# Patient Record
Sex: Female | Born: 1935 | Race: White | Hispanic: No | Smoking: Never smoker
Health system: Southern US, Community
[De-identification: ages and names within clinical notes are randomized; demographics above are authoritative.]

## PROBLEM LIST (undated history)

## (undated) DIAGNOSIS — E559 Vitamin D deficiency, unspecified: Secondary | ICD-10-CM

## (undated) DIAGNOSIS — IMO0002 Reserved for concepts with insufficient information to code with codable children: Secondary | ICD-10-CM

## (undated) DIAGNOSIS — E782 Mixed hyperlipidemia: Secondary | ICD-10-CM

## (undated) DIAGNOSIS — F0280 Dementia in other diseases classified elsewhere without behavioral disturbance: Secondary | ICD-10-CM

## (undated) DIAGNOSIS — K5730 Diverticulosis of large intestine without perforation or abscess without bleeding: Secondary | ICD-10-CM

## (undated) DIAGNOSIS — D126 Benign neoplasm of colon, unspecified: Secondary | ICD-10-CM

## (undated) DIAGNOSIS — I10 Essential (primary) hypertension: Secondary | ICD-10-CM

## (undated) DIAGNOSIS — C801 Malignant (primary) neoplasm, unspecified: Secondary | ICD-10-CM

## (undated) DIAGNOSIS — M1611 Unilateral primary osteoarthritis, right hip: Secondary | ICD-10-CM

## (undated) DIAGNOSIS — B029 Zoster without complications: Secondary | ICD-10-CM

## (undated) DIAGNOSIS — I5189 Other ill-defined heart diseases: Secondary | ICD-10-CM

## (undated) DIAGNOSIS — C439 Malignant melanoma of skin, unspecified: Secondary | ICD-10-CM

## (undated) DIAGNOSIS — R7303 Prediabetes: Secondary | ICD-10-CM

## (undated) DIAGNOSIS — E785 Hyperlipidemia, unspecified: Secondary | ICD-10-CM

## (undated) DIAGNOSIS — K649 Unspecified hemorrhoids: Secondary | ICD-10-CM

## (undated) DIAGNOSIS — K3184 Gastroparesis: Secondary | ICD-10-CM

## (undated) DIAGNOSIS — K648 Other hemorrhoids: Secondary | ICD-10-CM

## (undated) DIAGNOSIS — R9431 Abnormal electrocardiogram [ECG] [EKG]: Secondary | ICD-10-CM

## (undated) DIAGNOSIS — M1990 Unspecified osteoarthritis, unspecified site: Secondary | ICD-10-CM

## (undated) DIAGNOSIS — K222 Esophageal obstruction: Secondary | ICD-10-CM

## (undated) DIAGNOSIS — D62 Acute posthemorrhagic anemia: Secondary | ICD-10-CM

## (undated) DIAGNOSIS — H3530 Unspecified macular degeneration: Secondary | ICD-10-CM

## (undated) DIAGNOSIS — E039 Hypothyroidism, unspecified: Secondary | ICD-10-CM

## (undated) DIAGNOSIS — G309 Alzheimer's disease, unspecified: Secondary | ICD-10-CM

## (undated) DIAGNOSIS — K219 Gastro-esophageal reflux disease without esophagitis: Secondary | ICD-10-CM

## (undated) HISTORY — DX: Zoster without complications: B02.9

## (undated) HISTORY — DX: Other ill-defined heart diseases: I51.89

## (undated) HISTORY — DX: Dementia in other diseases classified elsewhere without behavioral disturbance: F02.80

## (undated) HISTORY — DX: Other hemorrhoids: K64.8

## (undated) HISTORY — DX: Gastroparesis: K31.84

## (undated) HISTORY — DX: Unspecified osteoarthritis, unspecified site: M19.90

## (undated) HISTORY — PX: OTHER SURGICAL HISTORY: SHX169

## (undated) HISTORY — PX: HEMORRHOID SURGERY: SHX153

## (undated) HISTORY — DX: Unspecified macular degeneration: H35.30

## (undated) HISTORY — PX: CATARACT EXTRACTION, BILATERAL: SHX1313

## (undated) HISTORY — PX: TONSILLECTOMY: SHX5217

## (undated) HISTORY — DX: Gastro-esophageal reflux disease without esophagitis: K21.9

## (undated) HISTORY — DX: Mixed hyperlipidemia: E78.2

## (undated) HISTORY — PX: CHOLECYSTECTOMY: SHX55

## (undated) HISTORY — DX: Esophageal obstruction: K22.2

## (undated) HISTORY — DX: Benign neoplasm of colon, unspecified: D12.6

## (undated) HISTORY — DX: Diverticulosis of large intestine without perforation or abscess without bleeding: K57.30

## (undated) HISTORY — DX: Essential (primary) hypertension: I10

## (undated) HISTORY — DX: Prediabetes: R73.03

## (undated) HISTORY — DX: Malignant melanoma of skin, unspecified: C43.9

## (undated) HISTORY — DX: Unilateral primary osteoarthritis, right hip: M16.11

## (undated) HISTORY — DX: Vitamin D deficiency, unspecified: E55.9

## (undated) HISTORY — DX: Abnormal electrocardiogram (ECG) (EKG): R94.31

## (undated) HISTORY — PX: FRACTURE SURGERY: SHX138

## (undated) HISTORY — DX: Acute posthemorrhagic anemia: D62

## (undated) HISTORY — DX: Alzheimer's disease, unspecified: G30.9

## (undated) HISTORY — DX: Reserved for concepts with insufficient information to code with codable children: IMO0002

## (undated) HISTORY — DX: Hyperlipidemia, unspecified: E78.5

## (undated) HISTORY — DX: Unspecified hemorrhoids: K64.9

## (undated) HISTORY — PX: THYROIDECTOMY: SHX17

## (undated) HISTORY — PX: APPENDECTOMY: SHX54

---

## 1997-08-31 ENCOUNTER — Ambulatory Visit (HOSPITAL_COMMUNITY): Admission: RE | Admit: 1997-08-31 | Discharge: 1997-08-31 | Payer: Self-pay | Admitting: *Deleted

## 1998-02-12 ENCOUNTER — Ambulatory Visit (HOSPITAL_COMMUNITY): Admission: RE | Admit: 1998-02-12 | Discharge: 1998-02-12 | Payer: Self-pay | Admitting: *Deleted

## 1998-06-19 ENCOUNTER — Ambulatory Visit (HOSPITAL_COMMUNITY): Admission: RE | Admit: 1998-06-19 | Discharge: 1998-06-19 | Payer: Self-pay | Admitting: *Deleted

## 1998-07-31 ENCOUNTER — Ambulatory Visit (HOSPITAL_COMMUNITY): Admission: RE | Admit: 1998-07-31 | Discharge: 1998-07-31 | Payer: Self-pay | Admitting: *Deleted

## 1998-10-01 ENCOUNTER — Encounter: Payer: Self-pay | Admitting: *Deleted

## 1998-10-01 ENCOUNTER — Ambulatory Visit (HOSPITAL_COMMUNITY): Admission: RE | Admit: 1998-10-01 | Discharge: 1998-10-01 | Payer: Self-pay | Admitting: *Deleted

## 1998-11-12 ENCOUNTER — Encounter (HOSPITAL_BASED_OUTPATIENT_CLINIC_OR_DEPARTMENT_OTHER): Payer: Self-pay | Admitting: General Surgery

## 1998-11-14 ENCOUNTER — Ambulatory Visit (HOSPITAL_COMMUNITY): Admission: RE | Admit: 1998-11-14 | Discharge: 1998-11-15 | Payer: Self-pay | Admitting: General Surgery

## 1999-01-22 ENCOUNTER — Encounter: Payer: Self-pay | Admitting: *Deleted

## 1999-01-22 ENCOUNTER — Ambulatory Visit (HOSPITAL_COMMUNITY): Admission: RE | Admit: 1999-01-22 | Discharge: 1999-01-22 | Payer: Self-pay | Admitting: *Deleted

## 1999-01-29 ENCOUNTER — Encounter: Payer: Self-pay | Admitting: *Deleted

## 1999-01-29 ENCOUNTER — Ambulatory Visit (HOSPITAL_COMMUNITY): Admission: RE | Admit: 1999-01-29 | Discharge: 1999-01-29 | Payer: Self-pay | Admitting: *Deleted

## 1999-02-07 ENCOUNTER — Encounter: Payer: Self-pay | Admitting: *Deleted

## 1999-02-07 ENCOUNTER — Ambulatory Visit (HOSPITAL_COMMUNITY): Admission: RE | Admit: 1999-02-07 | Discharge: 1999-02-07 | Payer: Self-pay | Admitting: *Deleted

## 1999-11-15 ENCOUNTER — Encounter: Payer: Self-pay | Admitting: *Deleted

## 1999-11-15 ENCOUNTER — Ambulatory Visit (HOSPITAL_COMMUNITY): Admission: RE | Admit: 1999-11-15 | Discharge: 1999-11-15 | Payer: Self-pay | Admitting: *Deleted

## 2000-01-10 ENCOUNTER — Encounter: Payer: Self-pay | Admitting: Obstetrics & Gynecology

## 2000-01-10 ENCOUNTER — Encounter: Admission: RE | Admit: 2000-01-10 | Discharge: 2000-01-10 | Payer: Self-pay | Admitting: Obstetrics & Gynecology

## 2000-04-14 HISTORY — PX: GALLBLADDER SURGERY: SHX652

## 2000-06-08 ENCOUNTER — Encounter: Payer: Self-pay | Admitting: *Deleted

## 2000-06-08 ENCOUNTER — Ambulatory Visit (HOSPITAL_COMMUNITY): Admission: RE | Admit: 2000-06-08 | Discharge: 2000-06-08 | Payer: Self-pay | Admitting: *Deleted

## 2000-06-15 ENCOUNTER — Ambulatory Visit (HOSPITAL_COMMUNITY): Admission: RE | Admit: 2000-06-15 | Discharge: 2000-06-15 | Payer: Self-pay | Admitting: *Deleted

## 2000-06-15 ENCOUNTER — Encounter: Payer: Self-pay | Admitting: *Deleted

## 2000-06-22 ENCOUNTER — Other Ambulatory Visit: Admission: RE | Admit: 2000-06-22 | Discharge: 2000-06-22 | Payer: Self-pay | Admitting: Urology

## 2000-07-12 ENCOUNTER — Emergency Department (HOSPITAL_COMMUNITY): Admission: EM | Admit: 2000-07-12 | Discharge: 2000-07-12 | Payer: Self-pay | Admitting: Emergency Medicine

## 2000-08-12 ENCOUNTER — Encounter (HOSPITAL_BASED_OUTPATIENT_CLINIC_OR_DEPARTMENT_OTHER): Payer: Self-pay | Admitting: General Surgery

## 2000-08-14 ENCOUNTER — Encounter (INDEPENDENT_AMBULATORY_CARE_PROVIDER_SITE_OTHER): Payer: Self-pay | Admitting: *Deleted

## 2000-08-14 ENCOUNTER — Ambulatory Visit (HOSPITAL_COMMUNITY): Admission: RE | Admit: 2000-08-14 | Discharge: 2000-08-15 | Payer: Self-pay | Admitting: General Surgery

## 2000-08-14 ENCOUNTER — Encounter (HOSPITAL_BASED_OUTPATIENT_CLINIC_OR_DEPARTMENT_OTHER): Payer: Self-pay | Admitting: General Surgery

## 2000-11-17 ENCOUNTER — Encounter: Payer: Self-pay | Admitting: *Deleted

## 2000-11-17 ENCOUNTER — Ambulatory Visit (HOSPITAL_COMMUNITY): Admission: RE | Admit: 2000-11-17 | Discharge: 2000-11-17 | Payer: Self-pay | Admitting: *Deleted

## 2001-01-07 ENCOUNTER — Encounter: Admission: RE | Admit: 2001-01-07 | Discharge: 2001-01-07 | Payer: Self-pay | Admitting: *Deleted

## 2001-01-07 ENCOUNTER — Other Ambulatory Visit: Admission: RE | Admit: 2001-01-07 | Discharge: 2001-01-07 | Payer: Self-pay | Admitting: Obstetrics and Gynecology

## 2001-01-07 ENCOUNTER — Encounter: Payer: Self-pay | Admitting: *Deleted

## 2001-05-19 ENCOUNTER — Emergency Department (HOSPITAL_COMMUNITY): Admission: EM | Admit: 2001-05-19 | Discharge: 2001-05-19 | Payer: Self-pay

## 2001-07-02 ENCOUNTER — Ambulatory Visit (HOSPITAL_COMMUNITY): Admission: RE | Admit: 2001-07-02 | Discharge: 2001-07-02 | Payer: Self-pay | Admitting: *Deleted

## 2001-12-11 ENCOUNTER — Emergency Department (HOSPITAL_COMMUNITY): Admission: EM | Admit: 2001-12-11 | Discharge: 2001-12-11 | Payer: Self-pay | Admitting: Emergency Medicine

## 2002-01-07 ENCOUNTER — Other Ambulatory Visit: Admission: RE | Admit: 2002-01-07 | Discharge: 2002-01-07 | Payer: Self-pay | Admitting: Obstetrics and Gynecology

## 2002-01-08 ENCOUNTER — Emergency Department (HOSPITAL_COMMUNITY): Admission: EM | Admit: 2002-01-08 | Discharge: 2002-01-08 | Payer: Self-pay | Admitting: Emergency Medicine

## 2002-01-10 ENCOUNTER — Encounter: Payer: Self-pay | Admitting: *Deleted

## 2002-01-10 ENCOUNTER — Encounter: Admission: RE | Admit: 2002-01-10 | Discharge: 2002-01-10 | Payer: Self-pay | Admitting: *Deleted

## 2002-01-17 ENCOUNTER — Encounter: Payer: Self-pay | Admitting: Obstetrics and Gynecology

## 2002-01-17 ENCOUNTER — Encounter: Admission: RE | Admit: 2002-01-17 | Discharge: 2002-01-17 | Payer: Self-pay | Admitting: Obstetrics and Gynecology

## 2003-01-13 ENCOUNTER — Encounter: Admission: RE | Admit: 2003-01-13 | Discharge: 2003-01-13 | Payer: Self-pay | Admitting: *Deleted

## 2003-01-13 ENCOUNTER — Encounter: Payer: Self-pay | Admitting: *Deleted

## 2004-02-01 ENCOUNTER — Encounter: Admission: RE | Admit: 2004-02-01 | Discharge: 2004-02-01 | Payer: Self-pay | Admitting: Internal Medicine

## 2004-06-04 ENCOUNTER — Ambulatory Visit: Payer: Self-pay | Admitting: Gastroenterology

## 2004-06-28 ENCOUNTER — Ambulatory Visit: Payer: Self-pay | Admitting: Gastroenterology

## 2004-06-28 DIAGNOSIS — K5730 Diverticulosis of large intestine without perforation or abscess without bleeding: Secondary | ICD-10-CM

## 2004-06-28 HISTORY — DX: Diverticulosis of large intestine without perforation or abscess without bleeding: K57.30

## 2005-02-12 ENCOUNTER — Encounter: Admission: RE | Admit: 2005-02-12 | Discharge: 2005-02-12 | Payer: Self-pay | Admitting: Internal Medicine

## 2006-02-13 ENCOUNTER — Encounter: Admission: RE | Admit: 2006-02-13 | Discharge: 2006-02-13 | Payer: Self-pay | Admitting: Internal Medicine

## 2006-06-22 ENCOUNTER — Encounter: Admission: RE | Admit: 2006-06-22 | Discharge: 2006-06-22 | Payer: Self-pay | Admitting: Internal Medicine

## 2007-02-15 ENCOUNTER — Encounter: Admission: RE | Admit: 2007-02-15 | Discharge: 2007-02-15 | Payer: Self-pay | Admitting: Internal Medicine

## 2007-03-20 ENCOUNTER — Emergency Department (HOSPITAL_COMMUNITY): Admission: EM | Admit: 2007-03-20 | Discharge: 2007-03-20 | Payer: Self-pay | Admitting: Emergency Medicine

## 2007-07-21 ENCOUNTER — Ambulatory Visit: Payer: Self-pay | Admitting: Gastroenterology

## 2007-08-30 ENCOUNTER — Telehealth: Payer: Self-pay | Admitting: Gastroenterology

## 2007-09-08 ENCOUNTER — Ambulatory Visit: Payer: Self-pay | Admitting: Gastroenterology

## 2007-09-09 ENCOUNTER — Telehealth: Payer: Self-pay | Admitting: Gastroenterology

## 2007-09-10 ENCOUNTER — Telehealth: Payer: Self-pay | Admitting: Gastroenterology

## 2007-10-12 ENCOUNTER — Encounter: Payer: Self-pay | Admitting: Gastroenterology

## 2007-10-25 DIAGNOSIS — K219 Gastro-esophageal reflux disease without esophagitis: Secondary | ICD-10-CM | POA: Insufficient documentation

## 2007-10-27 ENCOUNTER — Ambulatory Visit: Payer: Self-pay | Admitting: Gastroenterology

## 2007-10-27 DIAGNOSIS — K222 Esophageal obstruction: Secondary | ICD-10-CM

## 2007-10-27 HISTORY — DX: Esophageal obstruction: K22.2

## 2008-03-14 ENCOUNTER — Encounter: Admission: RE | Admit: 2008-03-14 | Discharge: 2008-03-14 | Payer: Self-pay | Admitting: Internal Medicine

## 2008-06-22 ENCOUNTER — Encounter: Payer: Self-pay | Admitting: Cardiovascular Disease

## 2008-08-09 ENCOUNTER — Encounter: Admission: RE | Admit: 2008-08-09 | Discharge: 2008-08-09 | Payer: Self-pay | Admitting: Internal Medicine

## 2009-03-15 ENCOUNTER — Encounter: Admission: RE | Admit: 2009-03-15 | Discharge: 2009-03-15 | Payer: Self-pay | Admitting: Internal Medicine

## 2009-07-17 ENCOUNTER — Encounter: Payer: Self-pay | Admitting: Cardiovascular Disease

## 2009-07-18 ENCOUNTER — Encounter: Payer: Self-pay | Admitting: Cardiovascular Disease

## 2009-07-27 ENCOUNTER — Encounter: Admission: RE | Admit: 2009-07-27 | Discharge: 2009-07-27 | Payer: Self-pay | Admitting: Internal Medicine

## 2009-08-14 ENCOUNTER — Ambulatory Visit: Payer: Self-pay | Admitting: Cardiovascular Disease

## 2009-08-22 ENCOUNTER — Ambulatory Visit: Payer: Self-pay | Admitting: Gastroenterology

## 2009-08-29 ENCOUNTER — Ambulatory Visit: Payer: Self-pay

## 2009-08-29 ENCOUNTER — Ambulatory Visit: Payer: Self-pay | Admitting: Cardiology

## 2009-08-29 ENCOUNTER — Ambulatory Visit (HOSPITAL_COMMUNITY): Admission: RE | Admit: 2009-08-29 | Discharge: 2009-08-29 | Payer: Self-pay | Admitting: Cardiovascular Disease

## 2009-09-04 ENCOUNTER — Ambulatory Visit: Payer: Self-pay | Admitting: Cardiovascular Disease

## 2009-09-04 DIAGNOSIS — I5189 Other ill-defined heart diseases: Secondary | ICD-10-CM

## 2009-09-04 HISTORY — DX: Other ill-defined heart diseases: I51.89

## 2010-03-18 ENCOUNTER — Encounter: Admission: RE | Admit: 2010-03-18 | Discharge: 2010-03-18 | Payer: Self-pay | Admitting: Internal Medicine

## 2010-05-14 NOTE — Assessment & Plan Note (Signed)
Summary: consult before col...em   History of Present Illness Visit Type: consult  Primary GI MD: [name] MD [facility] Primary Provider: Marisue Brooklyn, DO  Requesting Provider: Marisue Brooklyn DO Chief Complaint: Consult colon. Pt denies any GI complaints  History of Present Illness:   Crystal Petersen is 77year-old white female referred at the request of Dr. Elisabeth Most for consideration of followup colonoscopy.  In 2006 2 diminutive adenomatous polyps were removed on routine testing.  The patient has no GI complaints including change of bowel habits, abdominal pain, melena or hematochezia   GI Review of Systems      Denies abdominal pain, acid reflux, belching, bloating, chest pain, dysphagia with liquids, dysphagia with solids, heartburn, loss of appetite, nausea, vomiting, vomiting blood, weight loss, and  weight gain.        Denies anal fissure, black tarry stools, change in bowel habit, constipation, diarrhea, diverticulosis, fecal incontinence, heme positive stool, hemorrhoids, irritable bowel syndrome, jaundice, light color stool, liver problems, rectal bleeding, and  rectal pain.    Current Medications (verified): 1)  Sular 17 Mg  Tb24 (Nisoldipine) .... One Tablet Every Day 2)  Labetalol Hcl 100 Mg  Tabs (Labetalol Hcl) .... One Tablet Twice A Day 3)  Levothyroxine Sodium 100 Mcg  Tabs (Levothyroxine Sodium) .... As Directed 4)  Zetia 10 Mg  Tabs (Ezetimibe) .... One Tablet Every Day 5)  Miralax   Powd (Polyethylene Glycol 3350) .... Prn 6)  Benefiber   Powd (Wheat Dextrin) .... 2 Tsp Prn 7)  Colace 100 Mg  Caps (Docusate Sodium) .... 2 Tab Prn 8)  Omeprazole 40 Mg Cpdr (Omeprazole) .... One Tablet By Mouth 30 Min Before Breakfast 9)  Vitamin D3 2000 Unit Caps (Cholecalciferol) .... 2  Daily 10)  Code Ats48 Study Drug .... 2 Tabs Two Times A Day 11)  Centrum Silver Ultra Womens  Tabs (Multiple Vitamins-Minerals) 12)  Cal-Gest Antacid 500 Mg Chew (Calcium Carbonate Antacid) .... As  Needed 13)  Study Drug Code Lut/[id number] 14)  Ra Col-Rite 100 Mg Caps (Docusate Sodium) .... As Needed  Allergies (verified): 1)  ! Sulfa 2)  ! Reglan  Past History:  Past Medical History: Reviewed history from 08/14/2009 and no changes required. ELECTROCARDIOGRAM, ABNORMAL (ICD-794.31) STRICTURE AND STENOSIS OF ESOPHAGUS (ICD-530.3) GERD (ICD-530.81) Hiatal hernia DIVERTICULOSIS, COLON (ICD-562.10) ADENOMATOUS COLONIC POLYP (ICD-211.3) Macular degeneration Osteoperosis Hyperlipidemia Hypothyroidism HTN Shingles  Cystocele Gastroparesis     Past Surgical History: Reviewed history from 08/14/2009 and no changes required. gallbladder surgery 2002 hemorrhoid surgery appendectomy Thyroidectomy Left eye surgery Bilateral cataract removal Right arm melanoma resection BCC resection legs/face Tonsillectomy  Family History: Family History of Esophageal Cancer:brother Family History of Heart Disease: father Father deceased,  age 42, CAD/MI Mother deceased, age 39  ruptured appendix 2 brothers - 1 alive. 1 deceased pancreatic cancer 1 sister deceased encephalitis No FH of Colon Cancer:  Social History: Reviewed history from 08/14/2009 and no changes required. 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  Alcohol Use - no Illicit Drug Use - no Patient does not get regular exercise.   Review of Systems       The patient complains of arthritis/joint pain.  The patient denies allergy/sinus, anemia, anxiety-new, back pain, blood in urine, breast changes/lumps, change in vision, confusion, cough, coughing up blood, depression-new, fainting, fatigue, fever, headaches-new, hearing problems, heart murmur, heart rhythm changes, itching, menstrual pain, muscle pains/cramps, night sweats, nosebleeds, pregnancy symptoms, shortness of breath, skin rash,  sleeping problems, sore throat, swelling of feet/legs, swollen lymph glands, thirst - excessive  , urination - excessive , urination changes/pain, urine leakage, vision changes, and voice change.         All other systems were reviewed and were negative   Vital Signs:  Patient profile:   77 year old female Height:      64 inches Weight:      122 pounds BMI:     21.02 BSA:     1.59 Pulse rate:   60 / minute Pulse rhythm:   regular BP sitting:   126 / 64  (left arm) Cuff size:   regular  Vitals Entered By: Ok Anis CMA (Aug 22, 2009 1:29 PM)  Physical Exam  Additional Exam:  On physical exam she is an elderly female  skin: anicteric HEENT: normocephalic; PEERLA; no nasal or pharyngeal abnormalities neck: supple nodes: no cervical lymphadenopathy chest: clear to ausculatation and percussion heart: no murmurs, gallops, or rubs abd: soft, nontender; BS normoactive; no abdominal masses, tenderness, organomegaly rectal: deferred ext: no cynanosis, clubbing, edema skeletal: no deformities neuro: oriented x 3; no focal abnormalities    Impression & Recommendations:  Problem # 1:  ADENOMATOUS COLONIC POLYP (ICD-211.3) Tthe patient has no GI complaints at this time.  Given her age I believe that the risk exceeds  the benefit from elective colonoscopy.   Problem # 2:  STRICTURE AND STENOSIS OF ESOPHAGUS (ICD-530.3) Patient is asymptomatic.  Plan to repeat dilatation p.r.n.  Patient Instructions: 1)  Copy sent to : Amy Stevenson,MD 2)  The medication list was reviewed and reconciled.  All changed / newly prescribed medications were explained.  A complete medication list was provided to the patient / caregiver.

## 2010-05-14 NOTE — Assessment & Plan Note (Signed)
Summary: per check out/sf   Visit Type:  Follow-up Referring Provider:  Marisue Brooklyn DO Primary Provider:  Marisue Brooklyn, DO   CC:  No cardiac complains.  History of Present Illness: 77 yo WF with history of HTN, hyperlipidemia, hypothyroidism and GERD who is here today for cardiac follow up. She was seen several weeks ago to establish cardiology care and for further assessment of abnormal EKG which showed left anterior fascicular block. She had been feeling well. She did not describe any exertional chest pain. Breathing was ok. Fatigue was her only complaint. I ordered an echo to assess her LV size and function. (details of echo below). She has no new complaints today. She describes overall lack of energy and inability to sleep well at night. She is on thyroid replacement therapy with recent normal TSH.   Current Medications (verified): 1)  Sular 17 Mg  Tb24 (Nisoldipine) .... One Tablet Every Day 2)  Labetalol Hcl 100 Mg  Tabs (Labetalol Hcl) .... One Tablet Twice A Day 3)  Levothyroxine Sodium 100 Mcg  Tabs (Levothyroxine Sodium) .... As Directed 4)  Zetia 10 Mg  Tabs (Ezetimibe) .... One Tablet Every Day 5)  Miralax   Powd (Polyethylene Glycol 3350) .... Prn 6)  Benefiber   Powd (Wheat Dextrin) .... 2 Tsp Prn 7)  Colace 100 Mg  Caps (Docusate Sodium) .... 2 Tab Prn 8)  Omeprazole 40 Mg Cpdr (Omeprazole) .... One Tablet By Mouth 30 Min Before Breakfast 9)  Vitamin D 4000 Iu .... Once A Day 10)  Code Ats48 Study Drug .... 2 Tabs Two Times A Day 11)  Centrum Silver Ultra Womens  Tabs (Multiple Vitamins-Minerals) 12)  Cal-Gest Antacid 500 Mg Chew (Calcium Carbonate Antacid) .... As Needed 13)  Study Drug Code Lut/[id number] 14)  Flax Seed Oil 1000 Mg Caps (Flaxseed (Linseed)) .... 3 X A Week  Allergies: 1)  ! Sulfa 2)  ! Reglan 3)  ! * Statins  Past History:  Past Medical History: Reviewed history from 08/14/2009 and no changes required. ELECTROCARDIOGRAM, ABNORMAL  (ICD-794.31) STRICTURE AND STENOSIS OF ESOPHAGUS (ICD-530.3) GERD (ICD-530.81) Hiatal hernia DIVERTICULOSIS, COLON (ICD-562.10) ADENOMATOUS COLONIC POLYP (ICD-211.3) Macular degeneration Osteoperosis Hyperlipidemia Hypothyroidism HTN Shingles  Cystocele Gastroparesis     Social History: Reviewed history from 08/14/2009 and no changes required. 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  Alcohol Use - no Illicit Drug Use - no Patient does not get regular exercise.   Review of Systems       The patient complains of fatigue.  The patient denies malaise, fever, weight gain/loss, vision loss, decreased hearing, hoarseness, chest pain, palpitations, shortness of breath, prolonged cough, wheezing, sleep apnea, coughing up blood, abdominal pain, blood in stool, nausea, vomiting, diarrhea, heartburn, incontinence, blood in urine, muscle weakness, joint pain, leg swelling, rash, skin lesions, headache, fainting, dizziness, depression, anxiety, enlarged lymph nodes, easy bruising or bleeding, and environmental allergies.    Vital Signs:  Patient profile:   77 year old female Height:      64 inches Weight:      120.75 pounds BMI:     20.80 Pulse rate:   66 / minute Pulse rhythm:   regular Resp:     18 per minute BP sitting:   116 / 58  (right arm) Cuff size:   regular  Vitals Entered By: Vikki Ports (Sep 04, 2009 12:21 PM)  Physical Exam  General:  General: Well developed, well nourished, NAD HEENT:  OP clear, mucus membranes moist SKIN: warm, dry Psychiatric: Mood and affect normal.  Neck: No JVD Lungs:Clear bilaterally, no wheezes, rhonci, crackles CV: RRR no murmurs, gallops rubs Abdomen: soft, NT, ND, BS present Extremities: No edema, pulses 2+.    Echocardiogram  Procedure date:  08/29/2009  Findings:      Left ventricle: The cavity size was normal. There was mild focal       basal hypertrophy of the septum. Systolic  function was normal. The       estimated ejection fraction was in the range of 55% to 60%. Wall       motion was normal; there were no regional wall motion       abnormalities. Doppler parameters are consistent with abnormal       left ventricular relaxation (grade 1 diastolic dysfunction).     - Aortic valve: Trivial regurgitation.     - Left atrium: The atrium was mildly to moderately dilated.     - Right atrium: The atrium was mildly to moderately dilated.     - Pulmonary arteries: Systolic pressure was mildly increased.     - Pericardium, extracardiac: A small pericardial effusion was       identified.  Impression & Recommendations:  Problem # 1:  DIASTOLIC DYSFUNCTION (ICD-429.9) Echo with grade 1 diastolic dysfunction with normal LV systolic function. There was a small hemodynamically insignificant pericardial effusion. Repeat echo one year. No further cardiac workup at this time.   Patient Instructions: 1)  Your physician recommends that you schedule a follow-up appointment in: 1 year 2)  Your physician has requested that you have an echocardiogram.  Echocardiography is a painless test that uses sound waves to create images of your heart. It provides your doctor with information about the size and shape of your heart and how well your heart's chambers and valves are working.  This procedure takes approximately one hour. There are no restrictions for this procedure. To be done in 1 year

## 2010-05-14 NOTE — Assessment & Plan Note (Signed)
Summary: NP6/EKG CHANGES/BLUE-MEDICARE-MB   Visit Type:  Initial Consult Primary Provider:  Marisue Brooklyn MD  CC:  no complaints.  History of Present Illness: 77 yo WF with history of HTN, hyperlipidemia, hypothyroidism and GERD who is referred today to establish cardiology care and for further assessment of abnormal EKG. She has been feeling well. She has had no exertional chest pain. Breathing has been ok. She has had to slow down more over the last few years secondary to fatigue. She has had no dizziness, near syncope or syncope. She denies palpitations or awareness of irregularity of her heart rhythm. She was found to have an abnormal eKG at Dr. Hardie Pulley office.   Problems Prior to Update: 1)  Electrocardiogram, Abnormal  (ICD-794.31) 2)  Stricture and Stenosis of Esophagus  (ICD-530.3) 3)  Gerd  (ICD-530.81) 4)  Diverticulosis, Colon  (ICD-562.10) 5)  Adenomatous Colonic Polyp  (ICD-211.3)  Current Medications (verified): 1)  Sular 17 Mg  Tb24 (Nisoldipine) .... One Tablet Every Day 2)  Labetalol Hcl 100 Mg  Tabs (Labetalol Hcl) .... One Tablet Twice A Day 3)  Levothyroxine Sodium 100 Mcg  Tabs (Levothyroxine Sodium) .... One Tablet Every Other Day 4)  Zetia 10 Mg  Tabs (Ezetimibe) .... One Tablet Every Day 5)  Zegerid 20-1100 Mg  Caps (Omeprazole-Sodium Bicarbonate) .... 2 Each Day 30 Minutes Before Meal 6)  Miralax   Powd (Polyethylene Glycol 3350) .... Prn 7)  Benefiber   Powd (Wheat Dextrin) .... 2 Tsp Prn 8)  Colace 100 Mg  Caps (Docusate Sodium) .... 2 Tab Prn 9)  Vitamin C 500 Mg  Tabs (Ascorbic Acid) .Marland Kitchen.. 1 Once Daily 10)  Omeprazole 40 Mg Cpdr (Omeprazole) .Marland Kitchen.. 1 Tab Two Times A Day 11)  Vitamin D3 2000 Unit Caps (Cholecalciferol) .... 2  Daily 12)  Code Ats48 Study Drug .... 2 Tabs Two Times A Day 13)  Centrum Silver Ultra Womens  Tabs (Multiple Vitamins-Minerals) 14)  Cal-Gest Antacid 500 Mg Chew (Calcium Carbonate Antacid) .... As Needed 15)  Zegerid 40-1100 Mg  Caps (Omeprazole-Sodium Bicarbonate) .... Once Daily 16)  Study Drug Code Lut/[id number] 17)  Ra Col-Rite 100 Mg Caps (Docusate Sodium) .... As Needed  Allergies: 1)  ! Sulfa 2)  ! Reglan  Past History:  Past Medical History: ELECTROCARDIOGRAM, ABNORMAL (ICD-794.31) STRICTURE AND STENOSIS OF ESOPHAGUS (ICD-530.3) GERD (ICD-530.81) Hiatal hernia DIVERTICULOSIS, COLON (ICD-562.10) ADENOMATOUS COLONIC POLYP (ICD-211.3) Macular degeneration Osteoperosis Hyperlipidemia Hypothyroidism HTN Shingles  Cystocele Gastroparesis     Past Surgical History: gallbladder surgery 2002 hemorrhoid surgery appendectomy Thyroidectomy Left eye surgery Bilateral cataract removal Right arm melanoma resection BCC resection legs/face Tonsillectomy  Family History: Reviewed history from 10/27/2007 and no changes required. Family History of Esophageal Cancer:brother Family History of Heart Disease: father  Father deceased,  age 43, CAD/MI Mother deceased, age 6  ruptured appendix 2 brothers - 1 alive. 1 deceased pancreatic cancer 1 sister deceased encephalitis  Social History: Reviewed history from 10/27/2007 and no changes required. 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  Alcohol Use - no Illicit Drug Use - no Patient does not get regular exercise.   Vital Signs:  Patient profile:   77 year old female Height:      64 inches Weight:      122 pounds BMI:     21.02 Pulse rate:   74 / minute BP sitting:   123 / 69  (left arm) Cuff size:   regular  Vitals Entered  By: Oswald Hillock (Aug 14, 2009 3:14 PM)  Physical Exam  General:  General: Well developed, well nourished, NAD HEENT: OP clear, mucus membranes moist SKIN: warm, dry Neuro: No focal deficits Musculoskeletal: Muscle strength 5/5 all ext Psychiatric: Mood and affect normal Neck: No JVD, no carotid bruits, no thyromegaly, no lymphadenopathy. Lungs:Clear bilaterally, no  wheezes, rhonci, crackles CV: RRR no murmurs, gallops rubs Abdomen: soft, NT, ND, BS present Extremities: No edema, pulses 2+.    EKG  Procedure date:  08/14/2009  Findings:      NSR, Left anterior fascicular block.   Impression & Recommendations:  Problem # 1:  ELECTROCARDIOGRAM, ABNORMAL (ICD-794.31)  Her EKG is normal with the exception of a left anterior fascicular block. I have reassured her that this generally is a benign finding. I will arrange an echo to assess her LV function and exclude structural heart disease. I will see her back in several weeks to review.   Her updated medication list for this problem includes:    Sular 17 Mg Tb24 (Nisoldipine) ..... One tablet every day    Labetalol Hcl 100 Mg Tabs (Labetalol hcl) ..... One tablet twice a day  Orders: EKG w/ Interpretation (93000) Echocardiogram (Echo)  Patient Instructions: 1)  Your physician recommends that you schedule a follow-up appointment in: 3 weeks 2)  Your physician has requested that you have an echocardiogram.  Echocardiography is a painless test that uses sound waves to create images of your heart. It provides your doctor with information about the size and shape of your heart and how well your heart's chambers and valves are working.  This procedure takes approximately one hour. There are no restrictions for this procedure.

## 2010-05-14 NOTE — Letter (Signed)
Summary: [facility] Adolescent Office Note  [facility] Adolescent Office Note   Imported By: Roderic Ovens 09/03/2009 15:19:36  _____________________________________________________________________  External Attachment:    Type:   Image     Comment:   External Document

## 2010-05-17 NOTE — Letter (Signed)
Summary: [facility] Adolescent Office Visit   [facility] Adolescent Office Visit   Imported By: Roderic Ovens 09/03/2009 15:20:45  _____________________________________________________________________  External Attachment:    Type:   Image     Comment:   External Document

## 2010-08-27 NOTE — Assessment & Plan Note (Signed)
[facility]                         GASTROENTEROLOGY OFFICE NOTE   NAME:Shea, LUCYLLE FOULKES                     MRN:          [id number]  DATE:07/21/2007                            DOB:          February 09, 1922    PROBLEM:  Abdominal and chest pain.   Mrs. Martelle has returned for re-evaluation of complaints of the above.  For over three months, she has been complaining of regurgitation of  gastric contents with excessive belching.  She was hospitalized in  December 2007, because of severe chest pain.  She was placed on  omeprazole with improvement in her pain.  Since that time, she has had  persistent postprandial belching, upper abdominal pain and regurgitation  of gastric contents.  She initially took Reglan, which she had to  discontinue because of constipation.  She denies dysphagia.  She is  currently taking Zegerid with some improvement.   MEDICATIONS:  Include Sular, labetalol, levothyroxine, Zetia, and  Zegerid.   ALLERGIES:  She is allergic to SULFA and possibly REGLAN.   REVIEW OF SYSTEMS:  Was reviewed and is negative.   PHYSICAL EXAMINATION:  Pulse 72, blood pressure 130/76, weight 125.  HEENT: EOMI.  PERRLA.  Sclerae are anicteric.  Conjunctivae are pink.  NECK:  Supple without thyromegaly, adenopathy or carotid bruits.  CHEST:  Clear to auscultation and percussion without adventitious  sounds.  CARDIAC:  Regular rhythm; normal S1 S2.  There are no murmurs, gallops  or rubs.  ABDOMEN:  Bowel sounds are normoactive.  Abdomen is soft, nontender and  nondistended.  There are no abdominal masses, tenderness, splenic  enlargement or hepatomegaly.  EXTREMITIES:  Full range of motion.  No cyanosis, clubbing or edema.  RECTAL:  Deferred.  MUSCULOSKELETAL SYSTEM:  Without gross deformities, except for mild  arthritic changes of her fingers.  NEUROLOGIC EXAM:  She was oriented times three without gross focal  deficits.   IMPRESSION:  1.  Abdominal pain and regurgitation.  Symptoms are compatible with      gastroesophageal reflux.  She may have an element of gastroparesis      that could be exacerbating her symptoms.  2. Abdominal pain.  This very likely is related to #1.   RECOMMENDATION:  1. Continue Zegerid.  2. Upper endoscopy.  3. Antireflux measures.     Barbette Hair. Arlyce Dice, MD,FACG  Electronically Signed    RDK/MedQ  DD: 07/21/2007  DT: 07/21/2007  Job #: [id number]   cc:   Lovenia Kim, D.O.

## 2010-08-29 ENCOUNTER — Ambulatory Visit: Payer: Self-pay | Admitting: Cardiovascular Disease

## 2010-08-30 NOTE — Op Note (Signed)
[place]  Patient:    Crystal Petersen, Crystal Petersen                     MRN: [id number] Proc. Date: 08/14/00 Adm. Date:  [id number] Disc. Date: [id number] Attending:  Sonda Primes                           Operative Report  PREOPERATIVE DIAGNOSIS:  Chronic calculus cholecystitis.  POSTOPERATIVE DIAGNOSIS:  Chronic calculus cholecystitis.  PROCEDURE:  Laparoscopic cholecystectomy with intraoperative cholangiogram.  SURGEON:  Luisa Hart L. Lurene Shadow, M.D.  ASSISTANT:  Marnee Spring. Wiliam Ke, M.D.  ANESTHESIA:  General.  INDICATIONS:  This patient is a 77 year old lady presenting with _____ abdominal pain with nausea and bloating, and she was originally worked up because she had had some hematuria because of cystic kidneys.  On the KUB prior to IVP, she showed gallstones.  Ultrasound showed renal cyst.  The ultrasound showed multiple gallstones without any evidence of acute cholecystitis.  Liver function studies are within normal limits.  Amylase is normal.  She is brought to the operating room now for a laparoscopic cholecystectomy.  DESCRIPTION OF PROCEDURE:  Following the induction of satisfactory anesthesia, the patient was positioned supinely and the abdomen prepped and draped to be included in the sterile operative field.  Open laparoscopy with the insertion of Hasson cannula at the umbilicus was carried out.  The peritoneum was inflated to 14 mmHg pressure using carbon dioxide.  Exploration of the abdomen showed the gallbladder with multiple scarring and tethered duodenum.  Liver edges were sharp.  The liver surface is smooth and somewhat sugar coated.  The small and large intestine did appear to be normal.  We inserted the epigastric and lateral cannulas, grasped the gallbladder, and retracted it cephalad. Dissected the duodenum off the wall of the gallbladder.  Carrying the dissection down into the ampulla, the cystic artery and cystic duct  were isolated.  The cystic artery was traced up to its entrance to the gallbladder wall, and then doubly clipped and transected.  The cystic duct traced up to the gallbladder.  The cystic duct junction was then clipped proximally and opened.  A cystic duct cholangiogram was carried out by placing a Reddick catheter with a 14-gauge angio into the cystic duct and injecting one-half strength Hypaque into the biliary system on the fluoroscopy.  The fluoroscopic evaluation showed free flow of contrast into the duodenum.  No filling defects within any of the biliary system.  The caliber of the biliary ducts were normal.  The cholangiocatheter was removed and the cystic duct doubly placed and transected.  The gallbladder then dissected free and were using electrocautery from the liver bed.  The camera was moved to the epigastric port and the gallbladder was retrieved through the umbilical port without difficulty.  All areas of dissection were checked for hemostasis and found to be dry.  The sponge, instrument, and sharp counts were verified, and the wound was closed in layers as follows; epigastric and lateral flank wounds were closed with 4-0 Dexon, the umbilical wound closed in two layers with 0 Dexon and 4-0 Dexon.  All wounds were then reinforced with Steri-Strips.  A sterile dressing was applied, anesthetic reversed, and the patient removed from the operating room to the recovery room in stable condition.  She tolerated the procedure well. DD:  08/14/00 TD:  08/16/00 Job: [id number]

## 2010-09-13 ENCOUNTER — Encounter: Payer: Self-pay | Admitting: Cardiovascular Disease

## 2010-10-02 ENCOUNTER — Ambulatory Visit: Payer: Self-pay | Admitting: Cardiovascular Disease

## 2010-10-24 ENCOUNTER — Encounter: Payer: Self-pay | Admitting: Cardiovascular Disease

## 2010-10-24 ENCOUNTER — Ambulatory Visit (INDEPENDENT_AMBULATORY_CARE_PROVIDER_SITE_OTHER): Payer: Medicare Other | Admitting: Cardiovascular Disease

## 2010-10-24 VITALS — BP 130/78 | HR 60 | Ht 65.0 in | Wt 119.0 lb

## 2010-10-24 DIAGNOSIS — R9431 Abnormal electrocardiogram [ECG] [EKG]: Secondary | ICD-10-CM

## 2010-10-24 NOTE — Progress Notes (Signed)
History of Present Illness:77 yo WF with history of HTN, hyperlipidemia, hypothyroidism and GERD who is here today for cardiac follow up. She was seen for the first time in April 2011 for further assessment of abnormal EKG which showed left anterior fascicular block. She had been feeling well. She did not describe any exertional chest pain. Breathing was ok. Fatigue was her only complaint. I ordered an echo to assess her LV size and function. Her echo  Showed normal LV function.   She has been feeling well. She has had no chest pain or SOB. NO dizziness or syncope. Her energy level has been the same. She does not sleep well at night.   Past Medical History  Diagnosis Date  . Nonspecific abnormal electrocardiogram (ECG) (EKG)   . Stricture and stenosis of esophagus   . Esophageal reflux   . Diverticulosis of colon (without mention of hemorrhage)   . Benign neoplasm of colon   . Macular degeneration   . Osteoarthritis     osteroperosis  . Other and unspecified hyperlipidemia   . Hyperthyroidism   . HTN (hypertension)   . Shingles   . Cystocele   . Gastroparesis     Past Surgical History  Procedure Date  . Gallbladder surgery 2002  . Hemorrhoid surgery   . Appendectomy   . Thyroidectomy   . Left eye surgery   . Cataract extraction, bilateral   . Melenoma resection     right arm  . Bcc resection     legs/face  . Tonsillectomy     Current Outpatient Prescriptions  Medication Sig Dispense Refill  . amLODipine (NORVASC) 10 MG tablet Take 1/2 tablet daily      . calcium carbonate (CAL-GEST ANTACID) 500 MG chewable tablet Chew 1 tablet by mouth as needed.        . cephALEXin (KEFLEX) 500 MG capsule Take 2 tablets by mouth Daily. For 1 week for leg sore      . docusate sodium (COLACE) 100 MG capsule Take 100 mg by mouth 2 (two) times daily.        Marland Kitchen ezetimibe (ZETIA) 10 MG tablet Take 10 mg by mouth daily.        . Flaxseed, Linseed, (FLAX SEED OIL) 1000 MG CAPS Take by mouth. 3 x a  week       . labetalol (NORMODYNE) 100 MG tablet Take 100 mg by mouth 2 (two) times daily.        Marland Kitchen levothyroxine (SYNTHROID, LEVOTHROID) 100 MCG tablet Take 100 mcg by mouth daily. UAD        . Multiple Vitamins-Minerals (CENTRUM SILVER ULTRA WOMENS) TABS Take by mouth. Unspecified amount or useage.       . NON FORMULARY Code: ATS48 - study drug. 2 tabs BID.       Marland Kitchen NON FORMULARY Study drug code: [id number]       . omeprazole (PRILOSEC) 40 MG capsule Take 40 mg by mouth daily. 30 min before breakfast        . polyethylene glycol powder (MIRALAX) powder Take 17 g by mouth as needed. No specified dosage.       . Wheat Dextrin (BENEFIBER) POWD Take by mouth as needed. 2 tsp.         Allergies  Allergen Reactions  . Metoclopramide Hcl   . Statins   . Sulfonamide Derivatives     History   Social History  . Marital Status: Widowed    Spouse Name: N/A  Number of Children: N/A  . Years of Education: N/A   Occupational History  . Not on file.   Social History Main Topics  . Smoking status: Never Smoker   . Smokeless tobacco: Not on file  . Alcohol Use: No  . Drug Use: No  . Sexually Active: Not on file   Other Topics Concern  . Not on file   Social History Narrative   Retired Charity fundraiser- worked as a Engineer, civil (consulting) until [employer] . Pt does not get regular exercise    Family History  Problem Relation Age of Onset  . Esophageal cancer Brother   . Heart disease Father   . Colon cancer Neg Hx     Review of Systems:  As stated in the HPI and otherwise negative.   BP 130/78  Pulse 60  Ht 5\' 5"  (1.651 m)  Wt 119 lb (53.978 kg)  BMI 19.80 kg/m2  Physical Examination: General: Well developed, well nourished, NAD HEENT: OP clear, mucus membranes moist SKIN: warm, dry. No rashes. Neuro: No focal deficits Musculoskeletal: Muscle strength 5/5 all ext Psychiatric: Mood and affect normal Neck: No JVD, no carotid bruits, no thyromegaly, no lymphadenopathy. Lungs:Clear bilaterally, no wheezes,  rhonci, crackles Cardiovascular: Regular rate and rhythm. No murmurs, gallops or rubs. Abdomen:Soft. Bowel sounds present. Non-tender.  Extremities: No lower extremity edema. Pulses are 2 + in the bilateral DP/PT.  EKG:NSR, rate 60bpm. LAD.

## 2010-10-24 NOTE — Assessment & Plan Note (Signed)
She has Left anterior fascicular block on EKG. No further workup. I will see her back as needed.

## 2011-01-10 ENCOUNTER — Ambulatory Visit: Payer: Medicare Other | Admitting: Gastroenterology

## 2011-01-20 LAB — URINALYSIS, ROUTINE W REFLEX MICROSCOPIC
Bilirubin Urine: NEGATIVE
Nitrite: NEGATIVE
Specific Gravity, Urine: 1.006
pH: 7

## 2011-01-20 LAB — URINE MICROSCOPIC-ADD ON

## 2011-01-20 LAB — BASIC METABOLIC PANEL
BUN: 17
Calcium: 9.2
Creatinine, Ser: 0.65
GFR calc non Af Amer: >60

## 2011-01-20 LAB — CBC
Platelets: 270
WBC: 5.4

## 2011-01-20 LAB — DIFFERENTIAL
Lymphocytes Relative: 28
Lymphs Abs: 1.5
Neutrophils Relative %: 58

## 2011-01-20 LAB — POCT CARDIAC MARKERS
Myoglobin, poc: 58.1
Operator id: 1192
Troponin i, poc: <0.05

## 2011-01-20 LAB — B-NATRIURETIC PEPTIDE (CONVERTED LAB): Pro B Natriuretic peptide (BNP): 49.4

## 2011-01-21 ENCOUNTER — Encounter: Payer: Self-pay | Admitting: Gastroenterology

## 2011-01-21 ENCOUNTER — Ambulatory Visit (INDEPENDENT_AMBULATORY_CARE_PROVIDER_SITE_OTHER): Payer: Medicare Other | Admitting: Gastroenterology

## 2011-01-21 ENCOUNTER — Telehealth: Payer: Self-pay | Admitting: *Deleted

## 2011-01-21 VITALS — BP 118/62 | HR 76 | Ht 64.0 in | Wt 119.0 lb

## 2011-01-21 DIAGNOSIS — K222 Esophageal obstruction: Secondary | ICD-10-CM

## 2011-01-21 DIAGNOSIS — K648 Other hemorrhoids: Secondary | ICD-10-CM

## 2011-01-21 DIAGNOSIS — D126 Benign neoplasm of colon, unspecified: Secondary | ICD-10-CM

## 2011-01-21 HISTORY — DX: Other hemorrhoids: K64.8

## 2011-01-21 MED ORDER — HYDROCORTISONE ACETATE 25 MG RE SUPP: 25.0000 mg | Freq: Two times a day (BID) | RECTAL | Status: DC

## 2011-01-21 NOTE — Assessment & Plan Note (Signed)
In view of her age I would not do routine surveillance colonoscopy

## 2011-01-21 NOTE — Progress Notes (Signed)
  Crystal Petersen  is a pleasant 77 year old white female with history of an esophageal stricture, diverticulosis, macular degeneration, and hypertension, referred at the request of Dr. Elisabeth Most for evaluation of hemorrhoids. In 2006 she underwent  colonoscopy which demonstrated diminutive polyps and diverticulosis. Upper endoscopy 2009 was pertinent for benign gastric polyps and inflammation at the GE junction.  She tends to be constipated and strains her stool. She status post hemorrhoidectomy. She complains of rectal discomfort and intermittent protrusion of an internal hemorrhoid. She is without bleeding.      Past Medical History  Diagnosis Date  . Nonspecific abnormal electrocardiogram (ECG) (EKG)   . Stricture and stenosis of esophagus   . Esophageal reflux   . Diverticulosis of colon (without mention of hemorrhage)   . Benign neoplasm of colon   . Macular degeneration   . Osteoarthritis     osteroperosis  . Other and unspecified hyperlipidemia   . Hyperthyroidism   . HTN (hypertension)   . Shingles   . Cystocele   . Gastroparesis   . Skin cancer (melanoma)    Past Surgical History  Procedure Date  . Gallbladder surgery 2002  . Hemorrhoid surgery   . Appendectomy   . Thyroidectomy   . Left eye surgery   . Cataract extraction, bilateral   . Melenoma resection     right arm  . Bcc resection     legs/face  . Tonsillectomy     reports that she has never smoked. She has never used smokeless tobacco. She reports that she does not drink alcohol or use illicit drugs. family history includes Esophageal cancer in her brother and Heart disease in her father.  There is no history of Colon cancer.  Current medications and social history were reviewed in [employer] electronic medical record  Review of Systems: Pertinent positive and negative review of systems were noted in the above HPI section. All other review of systems were otherwise negative.  Vital signs were reviewed  in today's medical record Physical Exam: General: Well developed , well nourished, no acute distress Head: Normocephalic and atraumatic Eyes:  sclerae anicteric, EOMI Ears: Normal auditory acuity Mouth: No deformity or lesions Neck: Supple, no masses or thyromegaly Lungs: Clear throughout to auscultation Heart: Regular rate and rhythm; no murmurs, rubs or bruits Abdomen: Soft, non tender and non distended. No masses, hepatosplenomegaly or hernias noted. Normal Bowel sounds Rectal:no external rectal abnormalities Musculoskeletal: Symmetrical with no gross deformities  Skin: No lesions on visible extremities Pulses:  Normal pulses noted Extremities: No clubbing, cyanosis, edema or deformities noted Neurological: Alert oriented x 4, grossly nonfocal Cervical Nodes:  No significant cervical adenopathy Inguinal Nodes: No significant inguinal adenopathy Psychological:  Alert and cooperative. Normal mood and affect

## 2011-01-21 NOTE — Patient Instructions (Signed)
Your med is being sent to your pharmacy

## 2011-01-21 NOTE — Telephone Encounter (Signed)
Called pt to inform her that I gave her the wrong patient instruction sheet. She is going to shred the wrong one and Im mailing her a new pt instruction sheet with her correct information and medications

## 2011-01-21 NOTE — Assessment & Plan Note (Signed)
Asymptomatic. 

## 2011-01-21 NOTE — Assessment & Plan Note (Addendum)
By history she has grade 3 hemorrhoids.  Recommendations #1 anusol HC suppository; if she is not improved then I would consider band ligation

## 2011-01-28 ENCOUNTER — Other Ambulatory Visit: Payer: Self-pay | Admitting: Physician Assistant

## 2011-03-25 ENCOUNTER — Other Ambulatory Visit: Payer: Self-pay | Admitting: Physician Assistant

## 2011-03-31 ENCOUNTER — Other Ambulatory Visit: Payer: Self-pay | Admitting: Internal Medicine

## 2011-03-31 DIAGNOSIS — Z1231 Encounter for screening mammogram for malignant neoplasm of breast: Secondary | ICD-10-CM

## 2011-04-21 ENCOUNTER — Ambulatory Visit
Admission: RE | Admit: 2011-04-21 | Discharge: 2011-04-21 | Disposition: A | Discharge status: Home / Self Care | Payer: Medicare Other | Source: Ambulatory Visit | Attending: Internal Medicine | Admitting: Internal Medicine

## 2011-04-21 DIAGNOSIS — Z1231 Encounter for screening mammogram for malignant neoplasm of breast: Secondary | ICD-10-CM

## 2011-04-24 ENCOUNTER — Encounter: Payer: Self-pay | Admitting: Gastroenterology

## 2011-04-24 ENCOUNTER — Ambulatory Visit (INDEPENDENT_AMBULATORY_CARE_PROVIDER_SITE_OTHER): Payer: Medicare Other | Admitting: Gastroenterology

## 2011-04-24 VITALS — BP 110/64 | HR 68 | Ht 64.0 in | Wt 121.0 lb

## 2011-04-24 DIAGNOSIS — K648 Other hemorrhoids: Secondary | ICD-10-CM

## 2011-04-24 NOTE — Progress Notes (Signed)
History of Present Illness:  [name] has returned for reevaluation of her hemorrhoids. She continues to complain of painful protruding hemorrhoid. This is easily reducible but continues despite medical therapy.    Review of Systems: Pertinent positive and negative review of systems were noted in the above HPI section. All other review of systems were otherwise negative.    Current Medications, Allergies, Past Medical History, Past Surgical History, Family History and Social History were reviewed in [employer] electronic medical record  Vital signs were reviewed in today's medical record. Physical Exam: General: Well developed , well nourished, no acute distress

## 2011-04-24 NOTE — Assessment & Plan Note (Signed)
Plan band ligation 

## 2011-04-24 NOTE — Patient Instructions (Signed)
It has been recommended by Dr Arlyce Dice that you schedule a Flexible Sigmoidoscopy with banding for your hemorrhoids at [facility] You will need to call back to schedule this procedure when you are ready Call Bonita Quin or Zella Ball to get this scheduled at [phone]

## 2011-04-28 ENCOUNTER — Telehealth: Payer: Self-pay | Admitting: Gastroenterology

## 2011-04-28 NOTE — Telephone Encounter (Signed)
Crystal Petersen, This pt called needing a hospital procedure.. If you get time can you schedule. Im in clinic all day and bringing patients back all day tomorrow  If you dont have time I can do it sometime tomorrow afternoon..Thanks

## 2011-04-29 ENCOUNTER — Other Ambulatory Visit: Payer: Self-pay | Admitting: *Deleted

## 2011-04-29 NOTE — Telephone Encounter (Signed)
Patient scheduled for Flex with Banding on 05/12/2011 at 12:30pm Previsit scheduled on 05/06/2011 at 3pm

## 2011-05-06 ENCOUNTER — Encounter: Payer: Self-pay | Admitting: Gastroenterology

## 2011-05-06 ENCOUNTER — Ambulatory Visit (AMBULATORY_SURGERY_CENTER): Payer: Medicare Other | Admitting: *Deleted

## 2011-05-06 VITALS — Ht 64.5 in | Wt 122.0 lb

## 2011-05-06 DIAGNOSIS — K648 Other hemorrhoids: Secondary | ICD-10-CM

## 2011-05-12 ENCOUNTER — Encounter (HOSPITAL_COMMUNITY)
Admission: RE | Disposition: A | Discharge status: Home / Self Care | Payer: Self-pay | Source: Ambulatory Visit | Attending: Gastroenterology

## 2011-05-12 ENCOUNTER — Encounter (HOSPITAL_COMMUNITY): Payer: Self-pay | Admitting: *Deleted

## 2011-05-12 ENCOUNTER — Ambulatory Visit (HOSPITAL_COMMUNITY)
Admission: RE | Admit: 2011-05-12 | Discharge: 2011-05-12 | Disposition: A | Discharge status: Home / Self Care | Payer: Medicare Other | Source: Ambulatory Visit | Attending: Gastroenterology | Admitting: Gastroenterology

## 2011-05-12 DIAGNOSIS — K648 Other hemorrhoids: Secondary | ICD-10-CM | POA: Insufficient documentation

## 2011-05-12 HISTORY — DX: Hypothyroidism, unspecified: E03.9

## 2011-05-12 HISTORY — DX: Malignant (primary) neoplasm, unspecified: C80.1

## 2011-05-12 HISTORY — PX: FLEXIBLE SIGMOIDOSCOPY: SHX5431

## 2011-05-12 SURGERY — SIGMOIDOSCOPY, FLEXIBLE
Anesthesia: Moderate Sedation

## 2011-05-12 MED ORDER — MIDAZOLAM HCL 10 MG/2ML IJ SOLN
INTRAMUSCULAR | Status: AC
  Filled 2011-05-12: qty 2

## 2011-05-12 MED ORDER — SODIUM CHLORIDE 0.9 % IV SOLN
INTRAVENOUS | Status: DC
  Administered 2011-05-12: 12:00:00 via INTRAVENOUS

## 2011-05-12 MED ORDER — FENTANYL CITRATE 0.05 MG/ML IJ SOLN
INTRAMUSCULAR | Status: AC
  Filled 2011-05-12: qty 2

## 2011-05-12 MED ORDER — FENTANYL CITRATE 0.05 MG/ML IJ SOLN
INTRAMUSCULAR | Status: DC | PRN
  Administered 2011-05-12: 12.5 ug via INTRAVENOUS
  Administered 2011-05-12: 10 ug via INTRAVENOUS
  Administered 2011-05-12: 12.5 ug via INTRAVENOUS

## 2011-05-12 MED ORDER — ACETAMINOPHEN-CODEINE #3 300-30 MG PO TABS: 1.0000 | ORAL_TABLET | ORAL | Status: AC | PRN

## 2011-05-12 MED ORDER — MIDAZOLAM HCL 10 MG/2ML IJ SOLN
INTRAMUSCULAR | Status: DC | PRN
  Administered 2011-05-12 (×2): 1 mg via INTRAVENOUS

## 2011-05-12 NOTE — Interval H&P Note (Signed)
History and Physical Interval Note:  05/12/2011 12:27 PM  Crystal Petersen  has presented today for surgery, with the diagnosis of HEMORRHOIDS  The various methods of treatment have been discussed with the patient and family. After consideration of risks, benefits and other options for treatment, the patient has consented to  Procedure(s): FLEXIBLE SIGMOIDOSCOPY HEMORRHOID BANDING as a surgical intervention .  The patients' history has been reviewed, patient examined, no change in status, stable for surgery.  I have reviewed the patients' chart and labs.  Questions were answered to the patient's satisfaction.     The recent H&P (dated *11013**) was reviewed, the patient was examined and there is no change in the patients condition since that H&P was completed.   [name]  05/12/2011, 12:27 PM   [name]

## 2011-05-12 NOTE — Op Note (Signed)
[facility] [address] [place], [place]  [address]  FLEXIBLE SIGMOIDOSCOPY PROCEDURE REPORT  PATIENT:  Crystal Petersen, Crystal Petersen  MR#:  [id number] BIRTHDATE:  1921/07/23, 89 yrs. old  GENDER:  female  ENDOSCOPIST:  Barbette Hair. Arlyce Dice, MD Referred by:  Lucky Cowboy, M.D.  PROCEDURE DATE:  05/12/2011 PROCEDURE:  Flexible Sigmoidoscopy with banding ASA CLASS:  Class II INDICATIONS:  treatment of hemorrhoids  MEDICATIONS:   These medications were titrated to patient response per physician's verbal order, Fentanyl 25 mcg IV, Versed 3 mg IV  DESCRIPTION OF PROCEDURE:   After the risks benefits and alternatives of the procedure were thoroughly explained, informed consent was obtained.  Digital rectal exam was performed and revealed no abnormalities.   The  endoscope was introduced through the anus and advanced to the sigmoid colon, without limitations. The quality of the prep was .  The instrument was then slowly withdrawn as the mucosa was fully examined.  Internal hemorrhoids were found. hemorrhoidal banding 3 bands were placed circumferentially above the dentate line   Retroflexed views in the rectum revealed Internal hemorrhoids were seen on retroflexion.    The scope was then withdrawn from the patient and the procedure terminated.  COMPLICATIONS:  None  ENDOSCOPIC IMPRESSION: 1) Internal hemorrhoids - s/p band ligation  RECOMMENDATIONS: 1) follow-up: office 1 month(s)  REPEAT EXAM:  No  ______________________________ Barbette Hair. Arlyce Dice, MD  CC:  n. eSIGNED:   Barbette Hair. Mclain Freer at 05/12/2011 12:46 PM  [place], [place], [phone]

## 2011-05-12 NOTE — H&P (View-Only) (Signed)
History of Present Illness:  Crystal Petersen has returned for reevaluation of her hemorrhoids. She continues to complain of painful protruding hemorrhoid. This is easily reducible but continues despite medical therapy.    Review of Systems: Pertinent positive and negative review of systems were noted in the above HPI section. All other review of systems were otherwise negative.    Current Medications, Allergies, Past Medical History, Past Surgical History, Family History and Social History were reviewed in [facility] electronic medical record  Vital signs were reviewed in today's medical record. Physical Exam: General: Well developed , well nourished, no acute distress     

## 2011-05-13 ENCOUNTER — Encounter (HOSPITAL_COMMUNITY): Payer: Self-pay | Admitting: Gastroenterology

## 2011-05-14 ENCOUNTER — Telehealth: Payer: Self-pay | Admitting: Gastroenterology

## 2011-05-14 NOTE — Telephone Encounter (Signed)
Pt states she has not had a bowel movement since her procedure on Monday. Spoke with pt and let her know she can take miralax. Pt verbalized understanding.

## 2011-05-14 NOTE — Telephone Encounter (Signed)
See previous telephone note. 

## 2011-06-11 ENCOUNTER — Ambulatory Visit (INDEPENDENT_AMBULATORY_CARE_PROVIDER_SITE_OTHER): Payer: Medicare Other | Admitting: Gastroenterology

## 2011-06-11 ENCOUNTER — Encounter: Payer: Self-pay | Admitting: Gastroenterology

## 2011-06-11 VITALS — BP 94/52 | HR 80 | Ht 61.0 in | Wt 118.4 lb

## 2011-06-11 DIAGNOSIS — K648 Other hemorrhoids: Secondary | ICD-10-CM

## 2011-06-11 NOTE — Patient Instructions (Signed)
Hemorrhoids Hemorrhoids are enlarged (dilated) veins around the rectum. There are 2 types of hemorrhoids, and the type of hemorrhoid is determined by its location. Internal hemorrhoids occur in the veins just inside the rectum.They are usually not painful, but they may bleed.However, they may poke through to the outside and become irritated and painful. External hemorrhoids involve the veins outside the anus and can be felt as a painful swelling or hard lump near the anus.They are often itchy and may crack and bleed. Sometimes clots will form in the veins. This makes them swollen and painful. These are called thrombosed hemorrhoids. CAUSES Causes of hemorrhoids include:  Pregnancy. This increases the pressure in the hemorrhoidal veins.   Constipation.   Straining to have a bowel movement.   Obesity.   Heavy lifting or other activity that caused you to strain.  TREATMENT Most of the time hemorrhoids improve in 1 to 2 weeks. However, if symptoms do not seem to be getting better or if you have a lot of rectal bleeding, your caregiver may perform a procedure to help make the hemorrhoids get smaller or remove them completely.Possible treatments include:  Rubber band ligation. A rubber band is placed at the base of the hemorrhoid to cut off the circulation.   Sclerotherapy. A chemical is injected to shrink the hemorrhoid.   Infrared light therapy. Tools are used to burn the hemorrhoid.   Hemorrhoidectomy. This is surgical removal of the hemorrhoid.  HOME CARE INSTRUCTIONS   Increase fiber in your diet. Ask your caregiver about using fiber supplements.   Drink enough water and fluids to keep your urine clear or pale yellow.   Exercise regularly.   Go to the bathroom when you have the urge to have a bowel movement. Do not wait.   Avoid straining to have bowel movements.   Keep the anal area dry and clean.   Only take over-the-counter or prescription medicines for pain, discomfort,  or fever as directed by your caregiver.  If your hemorrhoids are thrombosed:  Take warm sitz baths for 20 to 30 minutes, 3 to 4 times per day.   If the hemorrhoids are very tender and swollen, place ice packs on the area as tolerated. Using ice packs between sitz baths may be helpful. Fill a plastic bag with ice. Place a towel between the bag of ice and your skin.   Medicated creams and suppositories may be used or applied as directed.   Do not use a donut-shaped pillow or sit on the toilet for long periods. This increases blood pooling and pain.  SEEK MEDICAL CARE IF:   You have increasing pain and swelling that is not controlled with your medicine.   You have uncontrolled bleeding.   You have difficulty or you are unable to have a bowel movement.   You have pain or inflammation outside the area of the hemorrhoids.   You have chills or an oral temperature above 102 F (38.9 C).  MAKE SURE YOU:   Understand these instructions.   Will watch your condition.   Will get help right away if you are not doing well or get worse.  Document Released: 03/28/2000 Document Revised: 12/11/2010 Document Reviewed: 08/03/2007 [facility] Patient Information 2012 [place], [place].  You will need to call back at your convenience when you are ready to schedule your Flexible Sigmoidoscopy with Hemorrhoidal banding

## 2011-06-11 NOTE — Assessment & Plan Note (Signed)
Plan repeat band ligation

## 2011-06-11 NOTE — Progress Notes (Signed)
History of Present Illness:  Following hemorrhoidal banding Crystal Petersen has noted improvement in her symptoms although she still has intermittent protrusion of a hemorrhoid that causes some discomfort.    Review of Systems: Pertinent positive and negative review of systems were noted in the above HPI section. All other review of systems were otherwise negative.    Current Medications, Allergies, Past Medical History, Past Surgical History, Family History and Social History were reviewed in [employer] electronic medical record  Vital signs were reviewed in today's medical record. Physical Exam: General: Well developed , well nourished, no acute distress

## 2011-06-12 ENCOUNTER — Encounter (HOSPITAL_COMMUNITY): Payer: Self-pay

## 2011-06-18 ENCOUNTER — Other Ambulatory Visit: Payer: Self-pay | Admitting: Physician Assistant

## 2011-08-11 ENCOUNTER — Ambulatory Visit (INDEPENDENT_AMBULATORY_CARE_PROVIDER_SITE_OTHER): Payer: Medicare Other | Admitting: Gastroenterology

## 2011-08-11 ENCOUNTER — Encounter: Payer: Self-pay | Admitting: Gastroenterology

## 2011-08-11 VITALS — BP 108/58 | HR 76 | Ht 64.0 in | Wt 118.2 lb

## 2011-08-11 DIAGNOSIS — K648 Other hemorrhoids: Secondary | ICD-10-CM

## 2011-08-11 NOTE — Progress Notes (Signed)
History of Present Illness:  Crystal Petersen is here today  complaining of hemorrhoids. She has mild constipation for which he takes stool softeners and occasional MiraLax. With straining she has protrusion of hemorrhoids that are painful. She occasionally has liquid drainage. She status post band ligation in January, 2013.    Review of Systems: Pertinent positive and negative review of systems were noted in the above HPI section. All other review of systems were otherwise negative.    Current Medications, Allergies, Past Medical History, Past Surgical History, Family History and Social History were reviewed in [facility] electronic medical record  Vital signs were reviewed in today's medical record. Physical Exam: General: Well developed , well nourished, no acute distress On rectal exam there are no external abnormalities   

## 2011-08-11 NOTE — Assessment & Plan Note (Addendum)
She has  grade 2 hemorrhoids that are symptomatic despite prior band ligation. Symptoms are exacerbated by straining.  Recommendations #1 repeat band ligation #2 trial of Linzess qd

## 2011-08-11 NOTE — Patient Instructions (Addendum)
Hemorrhoids Hemorrhoids are enlarged (dilated) veins around the rectum. There are 2 types of hemorrhoids, and the type of hemorrhoid is determined by its location. Internal hemorrhoids occur in the veins just inside the rectum.They are usually not painful, but they may bleed.However, they may poke through to the outside and become irritated and painful. External hemorrhoids involve the veins outside the anus and can be felt as a painful swelling or hard lump near the anus.They are often itchy and may crack and bleed. Sometimes clots will form in the veins. This makes them swollen and painful. These are called thrombosed hemorrhoids. CAUSES Causes of hemorrhoids include:  Pregnancy. This increases the pressure in the hemorrhoidal veins.   Constipation.   Straining to have a bowel movement.   Obesity.   Heavy lifting or other activity that caused you to strain.  TREATMENT Most of the time hemorrhoids improve in 1 to 2 weeks. However, if symptoms do not seem to be getting better or if you have a lot of rectal bleeding, your caregiver may perform a procedure to help make the hemorrhoids get smaller or remove them completely.Possible treatments include:  Rubber band ligation. A rubber band is placed at the base of the hemorrhoid to cut off the circulation.   Sclerotherapy. A chemical is injected to shrink the hemorrhoid.   Infrared light therapy. Tools are used to burn the hemorrhoid.   Hemorrhoidectomy. This is surgical removal of the hemorrhoid.  HOME CARE INSTRUCTIONS   Increase fiber in your diet. Ask your caregiver about using fiber supplements.   Drink enough water and fluids to keep your urine clear or pale yellow.   Exercise regularly.   Go to the bathroom when you have the urge to have a bowel movement. Do not wait.   Avoid straining to have bowel movements.   Keep the anal area dry and clean.   Only take over-the-counter or prescription medicines for pain, discomfort,  or fever as directed by your caregiver.  If your hemorrhoids are thrombosed:  Take warm sitz baths for 20 to 30 minutes, 3 to 4 times per day.   If the hemorrhoids are very tender and swollen, place ice packs on the area as tolerated. Using ice packs between sitz baths may be helpful. Fill a plastic bag with ice. Place a towel between the bag of ice and your skin.   Medicated creams and suppositories may be used or applied as directed.   Do not use a donut-shaped pillow or sit on the toilet for long periods. This increases blood pooling and pain.  SEEK MEDICAL CARE IF:   You have increasing pain and swelling that is not controlled with your medicine.   You have uncontrolled bleeding.   You have difficulty or you are unable to have a bowel movement.   You have pain or inflammation outside the area of the hemorrhoids.   You have chills or an oral temperature above 102 F (38.9 C).  MAKE SURE YOU:   Understand these instructions.   Will watch your condition.   Will get help right away if you are not doing well or get worse.  Document Released: 03/28/2000 Document Revised: 03/20/2011 Document Reviewed: 08/03/2007 [facility] Patient Information 2012 [place], [place].  Your Procedure is scheduled at [facility] on 09/10/2011 at 12:30pm We have given you Linzess samples today

## 2011-08-21 ENCOUNTER — Other Ambulatory Visit: Payer: Self-pay | Admitting: Internal Medicine

## 2011-08-26 ENCOUNTER — Ambulatory Visit
Admission: RE | Admit: 2011-08-26 | Discharge: 2011-08-26 | Disposition: A | Discharge status: Home / Self Care | Payer: Medicare Other | Source: Ambulatory Visit | Attending: Internal Medicine | Admitting: Internal Medicine

## 2011-09-10 ENCOUNTER — Encounter (HOSPITAL_COMMUNITY)
Admission: RE | Disposition: A | Discharge status: Home / Self Care | Payer: Self-pay | Source: Ambulatory Visit | Attending: Gastroenterology

## 2011-09-10 ENCOUNTER — Ambulatory Visit (HOSPITAL_COMMUNITY)
Admission: RE | Admit: 2011-09-10 | Discharge: 2011-09-10 | Disposition: A | Discharge status: Home / Self Care | Payer: Medicare Other | Source: Ambulatory Visit | Attending: Gastroenterology | Admitting: Gastroenterology

## 2011-09-10 ENCOUNTER — Encounter (HOSPITAL_COMMUNITY): Payer: Self-pay | Admitting: *Deleted

## 2011-09-10 ENCOUNTER — Other Ambulatory Visit: Payer: Self-pay | Admitting: Gastroenterology

## 2011-09-10 DIAGNOSIS — K648 Other hemorrhoids: Secondary | ICD-10-CM | POA: Insufficient documentation

## 2011-09-10 HISTORY — PX: FLEXIBLE SIGMOIDOSCOPY: SHX5431

## 2011-09-10 SURGERY — SIGMOIDOSCOPY, FLEXIBLE
Anesthesia: Moderate Sedation

## 2011-09-10 MED ORDER — FENTANYL CITRATE 0.05 MG/ML IJ SOLN
INTRAMUSCULAR | Status: DC | PRN
  Administered 2011-09-10 (×2): 25 ug via INTRAVENOUS

## 2011-09-10 MED ORDER — FENTANYL CITRATE 0.05 MG/ML IJ SOLN
INTRAMUSCULAR | Status: AC
  Filled 2011-09-10: qty 2

## 2011-09-10 MED ORDER — ACETAMINOPHEN-CODEINE #3 300-30 MG PO TABS: 2.0000 | ORAL_TABLET | Freq: Four times a day (QID) | ORAL | Status: AC | PRN

## 2011-09-10 MED ORDER — SODIUM CHLORIDE 0.9 % IV SOLN
Freq: Once | INTRAVENOUS | Status: AC
  Administered 2011-09-10: 500 mL via INTRAVENOUS

## 2011-09-10 MED ORDER — MIDAZOLAM HCL 10 MG/2ML IJ SOLN
INTRAMUSCULAR | Status: DC | PRN
  Administered 2011-09-10: 2 mg via INTRAVENOUS

## 2011-09-10 MED ORDER — MIDAZOLAM HCL 10 MG/2ML IJ SOLN
INTRAMUSCULAR | Status: AC
  Filled 2011-09-10: qty 2

## 2011-09-10 NOTE — Interval H&P Note (Signed)
History and Physical Interval Note:  09/10/2011 12:43 PM  Crystal Petersen  has presented today for surgery, with the diagnosis of hemorrhoids  The various methods of treatment have been discussed with the patient and family. After consideration of risks, benefits and other options for treatment, the patient has consented to  Procedure(s) (LRB): FLEXIBLE SIGMOIDOSCOPY (N/A) HEMORRHOID BANDING (N/A) as a surgical intervention .  The patients' history has been reviewed, patient examined, no change in status, stable for surgery.  I have reviewed the patients' chart and labs.  Questions were answered to the patient's satisfaction.    The recent H&P (dated *08/11/11**) was reviewed, the patient was examined and there is no change in the patients condition since that H&P was completed.   Crystal Petersen  09/10/2011, 12:43 PM    Crystal Petersen

## 2011-09-10 NOTE — H&P (View-Only) (Signed)
History of Present Illness:  Crystal Petersen is here today  complaining of hemorrhoids. She has mild constipation for which he takes stool softeners and occasional MiraLax. With straining she has protrusion of hemorrhoids that are painful. She occasionally has liquid drainage. She status post band ligation in January, 2013.    Review of Systems: Pertinent positive and negative review of systems were noted in the above HPI section. All other review of systems were otherwise negative.    Current Medications, Allergies, Past Medical History, Past Surgical History, Family History and Social History were reviewed in [employer] electronic medical record  Vital signs were reviewed in today's medical record. Physical Exam: General: Well developed , well nourished, no acute distress On rectal exam there are no external abnormalities

## 2011-09-10 NOTE — Discharge Instructions (Signed)
Flexible Sigmoidoscopy Your caregiver has ordered a flexible sigmoidoscopy. This is an exam to evaluate your lower colon. In this exam your colon is cleansed and a short fiber optic tube is inserted through your rectum and into your colon. The fiber optic scope (endoscope) is a short bundle of enclosed flexible small glass fibers. It transmits light to the area examined and images from that area to your caregiver. You do not have to worry about glass breakage in the endoscope. Discomfort is usually minimal. Sedatives and pain medications are generally not required. This exam helps to detect tumors (lumps), polyps, inflammation (swelling and soreness), and areas of bleeding. It may also be used to take biopsies. These are small pieces of tissue taken to examine under a microscope. LET YOUR CAREGIVER KNOW ABOUT:  Allergies.   Medications taken including herbs, eye drops, over the counter medications, and creams.   Use of steroids (by mouth or creams).   Previous problems with anesthetics or novocaine   Possibility of pregnancy, if this applies.   History of blood clots (thrombophlebitis).   History of bleeding or blood problems.   Previous surgery.   Other health problems.  BEFORE THE PROCEDURE Eat normally the night before the exam. Your caregiver may order a mild enema or laxative the night before. No eating or drinking should occur after midnight until the procedure is completed. A rectal suppository or enemas may be given in the morning prior to your procedure. You will be brought to the examination area in a hospital gown. You should be present 60 minutes prior to your procedure or as directed.  AFTER THE PROCEDURE   There is sometimes a little blood passed with the first bowel movement. Do not be concerned. Because air is often used during the exam, it is not unusual to pass gas and experience abdominal (belly) cramping. Walking or a warm pack on your abdomen may help with this. Do not  sleep with a heating pad as burns can occur.   You may resume all normal eating and activities.   Only take over-the-counter or prescription medicines for pain, discomfort, or fever as directed by your caregiver. Do not use aspirin or blood thinners if a biopsy (tissue sample) was taken. Consult your caregiver for medication usage if biopsies were taken.   Call for your results as instructed by your caregiver. Remember, it is your responsibility to obtain the results of your biopsy. Do not assume everything is fine because you do not hear from your caregiver.  SEEK IMMEDIATE MEDICAL CARE IF:  An oral temperature above 102 F (38.9 C) develops.   You pass large blood clots or fill a toilet with blood following the procedure. This may also occur 10 to 14 days following the procedure. It is more likely if a biopsy was taken.   You develop abdominal pain not relieved with medication or that is getting worse rather than better.  Document Released: 03/28/2000 Document Revised: 03/20/2011 Document Reviewed: 01/08/2005 [employer] Patient Information 2012 [employer]. 

## 2011-09-10 NOTE — Op Note (Signed)
[facility] [address] [place], [place]  [address]  FLEXIBLE SIGMOIDOSCOPY PROCEDURE REPORT  PATIENT:  Crystal, Petersen  MR#:  [id number] BIRTHDATE:  1922/01/17, 89 yrs. old  GENDER:  female  ENDOSCOPIST:  Barbette Hair. Arlyce Dice, MD Referred by:  PROCEDURE DATE:  09/10/2011 PROCEDURE:  Flexible Sigmoidoscopy with banding ASA CLASS:  Class II INDICATIONS:  treatment of hemorrhoids  MEDICATIONS:   These medications were titrated to patient response per physician's verbal order, Fentanyl 50 mcg IV, Versed 2 mg IV  DESCRIPTION OF PROCEDURE:   After the risks benefits and alternatives of the procedure were thoroughly explained, informed consent was obtained.  Digital rectal exam was performed and revealed no abnormalities.   The Pentax Gastroscope [id number] endoscope was introduced through the anus and advanced to the sigmoid colon, without limitations.  The quality of the prep was . The instrument was then slowly withdrawn as the mucosa was fully examined.  Internal hemorrhoids were found. hemorrhoidal banding 3 bands were placed just proximal to the dentate line overlying the 3 main hemorrhoidal bundles   Retroflexed views in the rectum revealed no abnormalities.    The scope was then withdrawn from the patient and the procedure terminated.  COMPLICATIONS:  None  ENDOSCOPIC IMPRESSION: 1) Internal hemorrhoidsv-s/p band ligation RECOMMENDATIONS: 1) Call the office to schedule a followup office visit for 1 month  REPEAT EXAM:  No  ______________________________ Barbette Hair. Arlyce Dice, MD  CC:  n. eSIGNED:   Barbette Hair. Miquel Lamson at 09/10/2011 12:51 PM  [place], [place], [phone]

## 2011-09-11 ENCOUNTER — Encounter (HOSPITAL_COMMUNITY): Payer: Self-pay | Admitting: Gastroenterology

## 2011-10-22 ENCOUNTER — Encounter: Payer: Self-pay | Admitting: Gastroenterology

## 2011-10-22 ENCOUNTER — Ambulatory Visit (INDEPENDENT_AMBULATORY_CARE_PROVIDER_SITE_OTHER): Payer: Medicare Other | Admitting: Gastroenterology

## 2011-10-22 VITALS — BP 114/50 | HR 72 | Ht 64.5 in | Wt 119.2 lb

## 2011-10-22 DIAGNOSIS — K648 Other hemorrhoids: Secondary | ICD-10-CM

## 2011-10-22 NOTE — Assessment & Plan Note (Addendum)
Status post band ligation with good response. She still has intermittent prolapse of hemorrhoids although she no longer has bleeding or pain. Because of straining and constipation conditions remain ripe for hemorrhoidal symptoms.  Recommendations #1 fiber supplementation daily #2 repeat band ligation as necessary

## 2011-10-22 NOTE — Patient Instructions (Addendum)
Follow up as needed

## 2011-10-22 NOTE — Progress Notes (Signed)
History of Present Illness:  Mrs. Ganoe has returned following band ligation of hemorrhoids. Bleeding and pain have entirely subsided. She still has occasional prolapse of a hemorrhoid which she easily manually reduces.  She tends to strain at the stool and often may go several days without a bowel movement.    Review of Systems: Pertinent positive and negative review of systems were noted in the above HPI section. All other review of systems were otherwise negative.    Current Medications, Allergies, Past Medical History, Past Surgical History, Family History and Social History were reviewed in [employer] electronic medical record  Vital signs were reviewed in today's medical record. Physical Exam: General: Well developed , well nourished, no acute distress

## 2012-01-01 ENCOUNTER — Telehealth: Payer: Self-pay | Admitting: Gastroenterology

## 2012-01-01 NOTE — Telephone Encounter (Signed)
Pt states she is having problems with a hemorrhoid again. Pt is requesting to banding done again. Dr. Arlyce Dice do you want to see the pt for an OV prior to the procedure? Please advise.

## 2012-01-01 NOTE — Telephone Encounter (Signed)
Ok to schedule banding

## 2012-01-14 NOTE — Telephone Encounter (Signed)
Pt states she had a shot in her eye today with the eye doctor and that she was told to wait 4 weeks before doing anything with the hemorrhoid. Pt instructed to call back when she is ready to schedule the banding. Pt verbalized understanding.

## 2012-01-29 ENCOUNTER — Telehealth: Payer: Self-pay | Admitting: Gastroenterology

## 2012-02-02 ENCOUNTER — Telehealth: Payer: Self-pay | Admitting: Gastroenterology

## 2012-02-02 ENCOUNTER — Other Ambulatory Visit: Payer: Self-pay | Admitting: Gastroenterology

## 2012-02-02 DIAGNOSIS — K649 Unspecified hemorrhoids: Secondary | ICD-10-CM

## 2012-02-02 NOTE — Telephone Encounter (Signed)
See phone note dated 02/02/12.

## 2012-02-02 NOTE — Telephone Encounter (Signed)
Pt scheduled for flex-sig with banding at [facility] 03/03/12 arrival time 11:30am for a 12:30pm appt. Pt aware of appt date and time. Prep instructions mailed to the pt. Scheduled with Noreene Larsson [phone].

## 2012-02-04 ENCOUNTER — Other Ambulatory Visit: Payer: Self-pay | Admitting: Physician Assistant

## 2012-02-19 ENCOUNTER — Telehealth: Payer: Self-pay | Admitting: *Deleted

## 2012-02-19 NOTE — Telephone Encounter (Signed)
Patient aware appointment has been changed till 11-26 at [facility]

## 2012-02-21 ENCOUNTER — Emergency Department (HOSPITAL_COMMUNITY): Payer: Medicare Other

## 2012-02-21 ENCOUNTER — Emergency Department (HOSPITAL_COMMUNITY)
Admission: EM | Admit: 2012-02-21 | Discharge: 2012-02-21 | Disposition: A | Discharge status: Home / Self Care | Payer: Medicare Other | Attending: Emergency Medicine | Admitting: Emergency Medicine

## 2012-02-21 ENCOUNTER — Encounter (HOSPITAL_COMMUNITY): Payer: Self-pay | Admitting: *Deleted

## 2012-02-21 ENCOUNTER — Emergency Department (HOSPITAL_COMMUNITY)
Admission: EM | Admit: 2012-02-21 | Discharge: 2012-02-21 | Discharge status: Against Medical Advice | Payer: Self-pay | Attending: Emergency Medicine | Admitting: Emergency Medicine

## 2012-02-21 DIAGNOSIS — W19XXXA Unspecified fall, initial encounter: Secondary | ICD-10-CM | POA: Insufficient documentation

## 2012-02-21 DIAGNOSIS — Z85820 Personal history of malignant melanoma of skin: Secondary | ICD-10-CM | POA: Insufficient documentation

## 2012-02-21 DIAGNOSIS — K219 Gastro-esophageal reflux disease without esophagitis: Secondary | ICD-10-CM | POA: Insufficient documentation

## 2012-02-21 DIAGNOSIS — T07XXXA Unspecified multiple injuries, initial encounter: Secondary | ICD-10-CM | POA: Insufficient documentation

## 2012-02-21 DIAGNOSIS — Y9301 Activity, walking, marching and hiking: Secondary | ICD-10-CM | POA: Insufficient documentation

## 2012-02-21 DIAGNOSIS — Z23 Encounter for immunization: Secondary | ICD-10-CM | POA: Insufficient documentation

## 2012-02-21 DIAGNOSIS — Y929 Unspecified place or not applicable: Secondary | ICD-10-CM | POA: Insufficient documentation

## 2012-02-21 DIAGNOSIS — I10 Essential (primary) hypertension: Secondary | ICD-10-CM | POA: Insufficient documentation

## 2012-02-21 DIAGNOSIS — W010XXA Fall on same level from slipping, tripping and stumbling without subsequent striking against object, initial encounter: Secondary | ICD-10-CM | POA: Insufficient documentation

## 2012-02-21 DIAGNOSIS — Z8719 Personal history of other diseases of the digestive system: Secondary | ICD-10-CM | POA: Insufficient documentation

## 2012-02-21 DIAGNOSIS — Z79899 Other long term (current) drug therapy: Secondary | ICD-10-CM | POA: Insufficient documentation

## 2012-02-21 DIAGNOSIS — E039 Hypothyroidism, unspecified: Secondary | ICD-10-CM | POA: Insufficient documentation

## 2012-02-21 DIAGNOSIS — E785 Hyperlipidemia, unspecified: Secondary | ICD-10-CM | POA: Insufficient documentation

## 2012-02-21 DIAGNOSIS — S42293A Other displaced fracture of upper end of unspecified humerus, initial encounter for closed fracture: Secondary | ICD-10-CM | POA: Insufficient documentation

## 2012-02-21 DIAGNOSIS — N8111 Cystocele, midline: Secondary | ICD-10-CM | POA: Insufficient documentation

## 2012-02-21 DIAGNOSIS — Y939 Activity, unspecified: Secondary | ICD-10-CM | POA: Insufficient documentation

## 2012-02-21 DIAGNOSIS — H3530 Unspecified macular degeneration: Secondary | ICD-10-CM | POA: Insufficient documentation

## 2012-02-21 DIAGNOSIS — M129 Arthropathy, unspecified: Secondary | ICD-10-CM | POA: Insufficient documentation

## 2012-02-21 DIAGNOSIS — Z862 Personal history of diseases of the blood and blood-forming organs and certain disorders involving the immune mechanism: Secondary | ICD-10-CM | POA: Insufficient documentation

## 2012-02-21 HISTORY — DX: Essential (primary) hypertension: I10

## 2012-02-21 MED ORDER — FENTANYL CITRATE 0.05 MG/ML IJ SOLN
50.0000 ug | Freq: Once | INTRAMUSCULAR | Status: AC
  Administered 2012-02-21: 50 ug via INTRAMUSCULAR
  Filled 2012-02-21: qty 2

## 2012-02-21 MED ORDER — HYDROCODONE-ACETAMINOPHEN 5-325 MG PO TABS: 1.0000 | ORAL_TABLET | ORAL | Status: DC | PRN

## 2012-02-21 MED ORDER — TETANUS-DIPHTH-ACELL PERTUSSIS 5-2.5-18.5 LF-MCG/0.5 IM SUSP
0.5000 mL | Freq: Once | INTRAMUSCULAR | Status: AC
  Administered 2012-02-21: 0.5 mL via INTRAMUSCULAR
  Filled 2012-02-21: qty 0.5

## 2012-02-21 MED ORDER — FENTANYL CITRATE 0.05 MG/ML IJ SOLN: 50.0000 ug | Freq: Once | INTRAMUSCULAR | Status: DC

## 2012-02-21 NOTE — ED Provider Notes (Signed)
History     CSN: [id number]  Arrival date & time 02/21/12  1211   First MD Initiated Contact with Patient 02/21/12 1226      Chief Complaint  Patient presents with  . Fall    (Consider location/radiation/quality/duration/timing/severity/associated sxs/prior treatment) HPI Amori Cooperman Carchi 78 y.o. female   The chief complaint is:  Stage manager Complaint  Patient presents with  . Fall     78 year old female presents via EMS for chief complaint of fall.  Patient states that she was getting off of the bus and walking on the side when she lost her footing.  She denies any dizziness loss of consciousness, heart palpitations.  Patient states that she fell on her face.  She denies any mouth pain or loss of teeth.  She complains of left shoulder pain which is severe.  She denies any numbness or tingling in her left hand.  Denies any trauma to her head or loss of consciousness.  She denies any hip pain.  Ambulatory at scene.  Past Medical History  Diagnosis Date  . Nonspecific abnormal electrocardiogram (ECG) (EKG)   . Stricture and stenosis of esophagus   . Esophageal reflux   . Diverticulosis of colon (without mention of hemorrhage)   . Benign neoplasm of colon   . Macular degeneration   . Osteoarthritis     osteroperosis  . Other and unspecified hyperlipidemia   . HTN (hypertension)   . Shingles   . Cystocele   . Gastroparesis   . Skin cancer (melanoma)   . Hypothyroidism   . Cancer     pre-melanoma  . Hemorrhoids     Past Surgical History  Procedure Date  . Gallbladder surgery 2002  . Hemorrhoid surgery   . Appendectomy   . Thyroidectomy   . Left eye surgery   . Cataract extraction, bilateral   . Melenoma resection     right arm  . Bcc resection     legs/face  . Tonsillectomy   . Flexible sigmoidoscopy 05/12/2011    Procedure: FLEXIBLE SIGMOIDOSCOPY;  Surgeon: Louis Meckel, MD;  Location: [facility];  Service: Endoscopy;  Laterality: N/A;  . Flexible  sigmoidoscopy 09/10/2011    Procedure: FLEXIBLE SIGMOIDOSCOPY;  Surgeon: Louis Meckel, MD;  Location: [facility];  Service: Endoscopy;  Laterality: N/A;    Family History  Problem Relation Age of Onset  . Esophageal cancer Brother   . Heart disease Father   . Colon cancer Neg Hx   . Malignant hyperthermia Neg Hx   . Diabetes Daughter     History  Substance Use Topics  . Smoking status: Never Smoker   . Smokeless tobacco: Never Used  . Alcohol Use: No    OB History    Grav Para Term Preterm Abortions TAB SAB Ect Mult Living                  Review of Systems  Constitutional: Negative.   HENT: Positive for hearing loss and facial swelling. Negative for ear pain (Chronic), nosebleeds, neck pain, neck stiffness, dental problem and ear discharge.   Respiratory: Negative.   Cardiovascular: Negative.   Gastrointestinal: Negative.   Genitourinary: Negative.   Musculoskeletal:       Left shoulder pain  Neurological: Negative.   Psychiatric/Behavioral: Negative.     Allergies  Metoclopramide hcl; Statins; Vicodin; and Sulfonamide derivatives  Home Medications   Current Outpatient Rx  Name  Route  Sig  Dispense  Refill  . AMLODIPINE BESYLATE  10 MG PO TABS      Take 1/2 tablet daily         . CALCIUM CARBONATE ANTACID 500 MG PO CHEW   Oral   Chew 1 tablet by mouth daily.          Marland Kitchen VITAMIN D 2000 UNITS PO TABS   Oral   Take 4,000 Units by mouth daily.         Marland Kitchen DOCUSATE SODIUM 100 MG PO CAPS   Oral   Take 100 mg by mouth 2 (two) times daily as needed.          Marland Kitchen EZETIMIBE 10 MG PO TABS   Oral   Take 10 mg by mouth daily.           Marland Kitchen FLAX SEED OIL 1000 MG PO CAPS   Oral   Take by mouth. 3 x a week          . LABETALOL HCL 100 MG PO TABS   Oral   Take 100 mg by mouth 2 (two) times daily.           Marland Kitchen LEVOTHYROXINE SODIUM 100 MCG PO TABS   Oral   Take 100 mcg by mouth daily. UAD  Pt. States she take T,TH,Sat., SUN  And  M,W,F          . OMEPRAZOLE 40 MG PO CPDR   Oral   Take 40 mg by mouth daily. 30 min before breakfast             BP 137/58  Pulse 70  Temp 98 F (36.7 C) (Oral)  Resp 16  SpO2 99%  Physical Exam  Nursing note and vitals reviewed. Constitutional: She is oriented to person, place, and time. She appears well-developed and well-nourished. No distress.  HENT:  Head: Head is with abrasion and with laceration.    Mouth/Throat: Uvula is midline. No uvula swelling. No posterior oropharyngeal edema or posterior oropharyngeal erythema.       Abrasion to the nose with some denuding of the skin.  Abrasion to the upper lip lip there is some swelling.  Appears to be about 1 cm meter laceration of the mucosal side of the lip.  Dentition is intact.  No loose teeth and no missing teeth.  Airway is patent  Eyes: EOM are normal. Pupils are equal, round, and reactive to light.       No nystagmus.  Neck: Normal range of motion and full passive range of motion without pain. Neck supple. No spinous process tenderness and no muscular tenderness present.  Musculoskeletal:       Left shoulder is exquisitely tender to palpation.  There appears to be mild swelling to the distal end of the clavicle.  No deformity ecchymosis.  Range of motion of the left elbow wrist hand and fingers.  No paresthesias.  Grip strength 5 out of 5.  Neurological: She is alert and oriented to person, place, and time. No cranial nerve deficit. Coordination normal.    ED Course  Procedures (including critical care time)  Labs Reviewed - No data to display Dg Shoulder Left  02/21/2012  *RADIOLOGY REPORT*  Clinical Data: Left shoulder pain post fall  LEFT SHOULDER - 2+ VIEW  Comparison: None.  Findings: Three views of the left shoulder submitted.  There is mild displaced impacted fracture of the left humeral neck.  IMPRESSION: Mild displaced impacted fracture of proximal left humerus.   Original Report Authenticated By: Lang Snow  Pop, M.D.      1. Closed fracture of head of humerus       MDM  Negative Cspine and Head CT. Mild displaced fracture of the left humerus.. Patient's son is on the way to pick her up.  Will Discharge with Vicodin and Ortho follow up.  BP 137/58  Pulse 70  Temp 98 F (36.7 C) (Oral)  Resp 16  SpO2 99%     Patient's daughter is at bedside. She wil be discharged.   Arthor Captain, PA-C 02/21/12 1615

## 2012-02-21 NOTE — ED Provider Notes (Signed)
Medical screening examination/treatment/procedure(s) were performed by non-physician practitioner and as supervising physician I was immediately available for consultation/collaboration.   Lyanne Co, MD 02/21/12 [phone]

## 2012-02-21 NOTE — ED Notes (Signed)
Ortho tech notified regarding arm sling

## 2012-02-21 NOTE — ED Notes (Signed)
EMS reports , patient tripped and fell onto sidewalk. No LOC . Patient c/o pain to left shoulder pain. Several abrasions to face.

## 2012-02-21 NOTE — ED Notes (Signed)
Sons contact information Damian Leavell [phone]. Pt. Requested that her son be notified.

## 2012-02-21 NOTE — ED Notes (Signed)
Per EMS, patient tripped and fell on sidewalk. No LOC. Patient c/o pain to left shoulder and has abrasion to nose.

## 2012-02-21 NOTE — ED Notes (Signed)
ZOX:[id number]<AV> Expected date:02/21/12<BR> Expected time:<BR> Means of arrival:<BR> Comments:<BR> Hold

## 2012-02-24 ENCOUNTER — Telehealth: Payer: Self-pay | Admitting: Gastroenterology

## 2012-02-25 NOTE — Telephone Encounter (Signed)
FYI- Dr Arcelia Jew cancelled hospital procedure will call back to schedule. She broke her arm

## 2012-02-26 ENCOUNTER — Telehealth: Payer: Self-pay | Admitting: Gastroenterology

## 2012-02-26 NOTE — Telephone Encounter (Signed)
Ok

## 2012-02-26 NOTE — Telephone Encounter (Signed)
Discussed with pt that she is already scheduled for flex-sig with banding at [facility] @12 :30pm on 03/09/12. Pt aware of appt date and time.

## 2012-03-02 ENCOUNTER — Telehealth: Payer: Self-pay | Admitting: Gastroenterology

## 2012-03-02 NOTE — Telephone Encounter (Signed)
Cancelled the procedure at [facility]. Pt states she saw her ortho doc and her shoulder is not healed enough for her to have the flex-sig with banding. Pt will call back to schedule when better.

## 2012-03-04 ENCOUNTER — Encounter (HOSPITAL_COMMUNITY): Admission: RE | Payer: Self-pay | Source: Ambulatory Visit

## 2012-03-04 ENCOUNTER — Ambulatory Visit (HOSPITAL_COMMUNITY): Admission: RE | Admit: 2012-03-04 | Payer: Medicare Other | Source: Ambulatory Visit | Admitting: Gastroenterology

## 2012-03-04 SURGERY — SIGMOIDOSCOPY, FLEXIBLE
Anesthesia: Moderate Sedation

## 2012-03-29 ENCOUNTER — Ambulatory Visit: Payer: Medicare Other | Attending: Orthopedic Surgery

## 2012-03-29 DIAGNOSIS — M25519 Pain in unspecified shoulder: Secondary | ICD-10-CM | POA: Insufficient documentation

## 2012-03-29 DIAGNOSIS — R5381 Other malaise: Secondary | ICD-10-CM | POA: Insufficient documentation

## 2012-03-29 DIAGNOSIS — M25619 Stiffness of unspecified shoulder, not elsewhere classified: Secondary | ICD-10-CM | POA: Insufficient documentation

## 2012-03-29 DIAGNOSIS — IMO0001 Reserved for inherently not codable concepts without codable children: Secondary | ICD-10-CM | POA: Insufficient documentation

## 2012-04-05 ENCOUNTER — Ambulatory Visit: Payer: Medicare Other

## 2012-04-27 ENCOUNTER — Ambulatory Visit: Payer: Medicare Other | Attending: Orthopedic Surgery | Admitting: Physical Therapy

## 2012-04-27 DIAGNOSIS — M25519 Pain in unspecified shoulder: Secondary | ICD-10-CM | POA: Insufficient documentation

## 2012-04-27 DIAGNOSIS — M25619 Stiffness of unspecified shoulder, not elsewhere classified: Secondary | ICD-10-CM | POA: Insufficient documentation

## 2012-04-27 DIAGNOSIS — W19XXXS Unspecified fall, sequela: Secondary | ICD-10-CM | POA: Insufficient documentation

## 2012-04-27 DIAGNOSIS — IMO0001 Reserved for inherently not codable concepts without codable children: Secondary | ICD-10-CM | POA: Insufficient documentation

## 2012-04-27 DIAGNOSIS — S42309S Unspecified fracture of shaft of humerus, unspecified arm, sequela: Secondary | ICD-10-CM | POA: Insufficient documentation

## 2012-04-27 DIAGNOSIS — R5381 Other malaise: Secondary | ICD-10-CM | POA: Insufficient documentation

## 2012-04-29 ENCOUNTER — Ambulatory Visit: Payer: Medicare Other | Admitting: Physical Therapy

## 2012-05-03 ENCOUNTER — Ambulatory Visit: Payer: Medicare Other | Admitting: Physical Therapy

## 2012-05-04 ENCOUNTER — Encounter (HOSPITAL_COMMUNITY): Payer: Self-pay | Admitting: *Deleted

## 2012-05-04 ENCOUNTER — Encounter: Payer: Medicare Other | Admitting: Physical Therapy

## 2012-05-06 ENCOUNTER — Ambulatory Visit: Payer: Medicare Other | Admitting: Physical Therapy

## 2012-05-11 ENCOUNTER — Ambulatory Visit: Payer: Medicare Other | Admitting: Physical Therapy

## 2012-05-13 ENCOUNTER — Ambulatory Visit: Payer: Medicare Other | Admitting: Physical Therapy

## 2012-05-18 ENCOUNTER — Ambulatory Visit: Payer: Medicare Other | Attending: Orthopedic Surgery | Admitting: Physical Therapy

## 2012-05-18 DIAGNOSIS — M25619 Stiffness of unspecified shoulder, not elsewhere classified: Secondary | ICD-10-CM | POA: Insufficient documentation

## 2012-05-18 DIAGNOSIS — IMO0001 Reserved for inherently not codable concepts without codable children: Secondary | ICD-10-CM | POA: Insufficient documentation

## 2012-05-18 DIAGNOSIS — R5381 Other malaise: Secondary | ICD-10-CM | POA: Insufficient documentation

## 2012-05-18 DIAGNOSIS — M25519 Pain in unspecified shoulder: Secondary | ICD-10-CM | POA: Insufficient documentation

## 2012-05-20 ENCOUNTER — Ambulatory Visit: Payer: Medicare Other | Admitting: Physical Therapy

## 2012-05-22 LAB — HM SIGMOIDOSCOPY

## 2012-05-25 ENCOUNTER — Ambulatory Visit: Payer: Medicare Other | Admitting: Physical Therapy

## 2012-05-27 ENCOUNTER — Ambulatory Visit: Payer: Medicare Other | Admitting: Physical Therapy

## 2012-06-01 ENCOUNTER — Encounter: Payer: Medicare Other | Admitting: Physical Therapy

## 2012-06-01 ENCOUNTER — Ambulatory Visit: Payer: Medicare Other | Admitting: Physical Therapy

## 2012-06-03 ENCOUNTER — Ambulatory Visit: Payer: Medicare Other

## 2012-06-08 ENCOUNTER — Ambulatory Visit: Payer: Medicare Other | Admitting: Physical Therapy

## 2012-06-10 ENCOUNTER — Ambulatory Visit: Payer: Medicare Other | Admitting: Physical Therapy

## 2012-06-15 ENCOUNTER — Ambulatory Visit: Payer: Medicare Other | Attending: Orthopedic Surgery

## 2012-06-15 DIAGNOSIS — M25519 Pain in unspecified shoulder: Secondary | ICD-10-CM | POA: Insufficient documentation

## 2012-06-15 DIAGNOSIS — R5381 Other malaise: Secondary | ICD-10-CM | POA: Insufficient documentation

## 2012-06-15 DIAGNOSIS — IMO0001 Reserved for inherently not codable concepts without codable children: Secondary | ICD-10-CM | POA: Insufficient documentation

## 2012-06-15 DIAGNOSIS — M25619 Stiffness of unspecified shoulder, not elsewhere classified: Secondary | ICD-10-CM | POA: Insufficient documentation

## 2012-06-17 ENCOUNTER — Ambulatory Visit: Payer: Medicare Other | Admitting: Physical Therapy

## 2012-06-22 ENCOUNTER — Ambulatory Visit: Payer: Medicare Other

## 2012-06-24 ENCOUNTER — Ambulatory Visit: Payer: Medicare Other | Admitting: Physical Therapy

## 2012-07-01 ENCOUNTER — Ambulatory Visit: Payer: Medicare Other

## 2012-07-02 ENCOUNTER — Other Ambulatory Visit (HOSPITAL_COMMUNITY): Payer: Self-pay | Admitting: Internal Medicine

## 2012-07-02 ENCOUNTER — Ambulatory Visit (HOSPITAL_COMMUNITY)
Admission: RE | Admit: 2012-07-02 | Discharge: 2012-07-02 | Disposition: A | Discharge status: Home / Self Care | Payer: Medicare Other | Source: Ambulatory Visit | Attending: Internal Medicine | Admitting: Internal Medicine

## 2012-07-02 DIAGNOSIS — I517 Cardiomegaly: Secondary | ICD-10-CM | POA: Insufficient documentation

## 2012-07-02 DIAGNOSIS — I700 Atherosclerosis of aorta: Secondary | ICD-10-CM | POA: Insufficient documentation

## 2012-07-02 DIAGNOSIS — I10 Essential (primary) hypertension: Secondary | ICD-10-CM

## 2012-07-02 DIAGNOSIS — R911 Solitary pulmonary nodule: Secondary | ICD-10-CM | POA: Insufficient documentation

## 2012-07-05 ENCOUNTER — Telehealth: Payer: Self-pay | Admitting: Gastroenterology

## 2012-07-08 ENCOUNTER — Encounter: Payer: Medicare Other | Admitting: Physical Therapy

## 2012-07-12 ENCOUNTER — Other Ambulatory Visit: Payer: Self-pay | Admitting: Gastroenterology

## 2012-07-12 DIAGNOSIS — K649 Unspecified hemorrhoids: Secondary | ICD-10-CM

## 2012-07-12 NOTE — Telephone Encounter (Signed)
Pt scheduled for flex-sig with banding at [facility] 08/16/12@12 :30pm. Prep instructions mailed to pt. Scheduled with Noreene Larsson, [phone]. Pt aware of appt date and time.

## 2012-07-13 ENCOUNTER — Encounter: Payer: Medicare Other | Admitting: Physical Therapy

## 2012-07-15 ENCOUNTER — Encounter: Payer: Medicare Other | Admitting: Physical Therapy

## 2012-07-20 ENCOUNTER — Encounter: Payer: Medicare Other | Admitting: Physical Therapy

## 2012-07-22 ENCOUNTER — Encounter: Payer: Medicare Other | Admitting: Physical Therapy

## 2012-07-27 ENCOUNTER — Encounter: Payer: Medicare Other | Admitting: Physical Therapy

## 2012-08-16 ENCOUNTER — Ambulatory Visit (HOSPITAL_COMMUNITY)
Admission: RE | Admit: 2012-08-16 | Discharge: 2012-08-16 | Disposition: A | Discharge status: Home / Self Care | Payer: Medicare Other | Source: Ambulatory Visit | Attending: Gastroenterology | Admitting: Gastroenterology

## 2012-08-16 ENCOUNTER — Encounter (HOSPITAL_COMMUNITY): Payer: Self-pay

## 2012-08-16 ENCOUNTER — Encounter (HOSPITAL_COMMUNITY)
Admission: RE | Disposition: A | Discharge status: Home / Self Care | Payer: Self-pay | Source: Ambulatory Visit | Attending: Gastroenterology

## 2012-08-16 ENCOUNTER — Telehealth: Payer: Self-pay | Admitting: Gastroenterology

## 2012-08-16 DIAGNOSIS — K620 Anal polyp: Secondary | ICD-10-CM | POA: Insufficient documentation

## 2012-08-16 DIAGNOSIS — E039 Hypothyroidism, unspecified: Secondary | ICD-10-CM | POA: Insufficient documentation

## 2012-08-16 DIAGNOSIS — K602 Anal fissure, unspecified: Secondary | ICD-10-CM | POA: Insufficient documentation

## 2012-08-16 DIAGNOSIS — I10 Essential (primary) hypertension: Secondary | ICD-10-CM | POA: Insufficient documentation

## 2012-08-16 DIAGNOSIS — K648 Other hemorrhoids: Secondary | ICD-10-CM | POA: Insufficient documentation

## 2012-08-16 DIAGNOSIS — K621 Rectal polyp: Secondary | ICD-10-CM | POA: Insufficient documentation

## 2012-08-16 DIAGNOSIS — K649 Unspecified hemorrhoids: Secondary | ICD-10-CM

## 2012-08-16 DIAGNOSIS — K625 Hemorrhage of anus and rectum: Secondary | ICD-10-CM | POA: Insufficient documentation

## 2012-08-16 HISTORY — PX: FLEXIBLE SIGMOIDOSCOPY: SHX5431

## 2012-08-16 HISTORY — PX: HEMORRHOID BANDING: SHX5850

## 2012-08-16 SURGERY — SIGMOIDOSCOPY, FLEXIBLE
Anesthesia: Moderate Sedation

## 2012-08-16 MED ORDER — MIDAZOLAM HCL 10 MG/2ML IJ SOLN
INTRAMUSCULAR | Status: AC
  Filled 2012-08-16: qty 2

## 2012-08-16 MED ORDER — SODIUM CHLORIDE 0.9 % IV SOLN
INTRAVENOUS | Status: DC
  Administered 2012-08-16: 500 mL via INTRAVENOUS

## 2012-08-16 MED ORDER — FENTANYL CITRATE 0.05 MG/ML IJ SOLN
INTRAMUSCULAR | Status: AC
  Filled 2012-08-16: qty 2

## 2012-08-16 MED ORDER — FENTANYL CITRATE 0.05 MG/ML IJ SOLN
INTRAMUSCULAR | Status: DC | PRN
  Administered 2012-08-16 (×2): 25 ug via INTRAVENOUS

## 2012-08-16 MED ORDER — MIDAZOLAM HCL 10 MG/2ML IJ SOLN
INTRAMUSCULAR | Status: DC | PRN
  Administered 2012-08-16: 1 mg via INTRAVENOUS

## 2012-08-16 NOTE — H&P (Signed)
History of Present Illness: 79yo WF here for hemorrhoidal banding.  She has recurrent c/o bleeding and discomfort. She has undergone banding twice in the past.    Past Medical History  Diagnosis Date  . Hypertension   . Nonspecific abnormal electrocardiogram (ECG) (EKG)   . Stricture and stenosis of esophagus   . Esophageal reflux   . Diverticulosis of colon (without mention of hemorrhage)   . Benign neoplasm of colon   . Macular degeneration   . Osteoarthritis     osteroperosis  . Other and unspecified hyperlipidemia   . HTN (hypertension)   . Shingles   . Cystocele   . Gastroparesis   . Skin cancer (melanoma)   . Hypothyroidism   . Cancer     pre-melanoma  . Hemorrhoids    Past Surgical History  Procedure Laterality Date  . Cholecystectomy    . Gallbladder surgery  2002  . Hemorrhoid surgery    . Appendectomy    . Thyroidectomy    . Left eye surgery    . Cataract extraction, bilateral    . Melenoma resection      right arm  . Bcc resection      legs/face  . Tonsillectomy    . Flexible sigmoidoscopy  05/12/2011    Procedure: FLEXIBLE SIGMOIDOSCOPY;  Surgeon: Louis Meckel, MD;  Location: [facility];  Service: Endoscopy;  Laterality: N/A;  . Flexible sigmoidoscopy  09/10/2011    Procedure: FLEXIBLE SIGMOIDOSCOPY;  Surgeon: Louis Meckel, MD;  Location: [facility];  Service: Endoscopy;  Laterality: N/A;  . Fracture surgery      left hip pin   family history includes Diabetes in her daughter; Esophageal cancer in her brother; and Heart disease in her father.  There is no history of Colon cancer and Malignant hyperthermia. Current Facility-Administered Medications  Medication Dose Route Frequency Provider Last Rate Last Dose  . 0.9 %  sodium chloride infusion   Intravenous Continuous Louis Meckel, MD 20 mL/hr at 08/16/12 1222 500 mL at 08/16/12 1222   Allergies as of 07/12/2012 - Review Complete 02/21/2012  Allergen Reaction Noted  . Metoclopramide hcl  Other (See Comments) 10/25/2007  . Statins Other (See Comments) 09/04/2009  . Vicodin (hydrocodone-acetaminophen) Other (See Comments) 05/06/2011  . Sulfonamide derivatives Rash 10/25/2007    reports that she has never smoked. She has never used smokeless tobacco. She reports that she does not drink alcohol or use illicit drugs.     Review of Systems: Pertinent positive and negative review of systems were noted in the above HPI section. All other review of systems were otherwise negative.  Vital signs were reviewed in today's medical record Physical Exam: General: Well developed , well nourished, no acute distress Skin: anicteric Head: Normocephalic and atraumatic Eyes:  sclerae anicteric, EOMI Ears: Normal auditory acuity Mouth: No deformity or lesions Neck: Supple, no masses or thyromegaly Lungs: Clear throughout to auscultation Heart: Regular rate and rhythm; no murmurs, rubs or bruits Abdomen: Soft, non tender and non distended. No masses, hepatosplenomegaly or hernias noted. Normal Bowel sounds Rectal:no external lesions Musculoskeletal: Symmetrical with no gross deformities  Skin: No lesions on visible extremities Pulses:  Normal pulses noted Extremities: No clubbing, cyanosis, edema or deformities noted Neurological: Alert oriented x 4, grossly nonfocal Cervical Nodes:  No significant cervical adenopathy Inguinal Nodes: No significant inguinal adenopathy Psychological:  Alert and cooperative. Normal mood and affect  A/P - symptomatic hemorrhoids  Will proceed with band ligation

## 2012-08-16 NOTE — Op Note (Signed)
[facility] [address] [place], [address]   FLEXIBLE SIGMOIDOSCOPY PROCEDURE REPORT  PATIENT: Crystal, Petersen  MR#: [id number] BIRTHDATE: Sep 15, 1921 , 89  yrs. old GENDER: Female ENDOSCOPIST: Louis Meckel, MD REFERRED BY: PROCEDURE DATE:  08/16/2012 PROCEDURE:   Sigmoidoscopy, diagnostic  and band ligation of rectal/anal polyp ASA CLASS:   Class II INDICATIONS:rectal bleeding. MEDICATIONS: These medications were titrated to patient response per physician's verbal order, Fentanyl 50 mcg IV, and Versed 1mg  IV  DESCRIPTION OF PROCEDURE:   After the risks benefits and alternatives of the procedure were thoroughly explained, informed consent was obtained.  revealed an anal fissure. The     endoscope was introduced through the anus  and advanced to the sigmoid colon , limited by No adverse events experienced.   The quality of the prep was    .  The instrument was then slowly withdrawn as the mucosa was fully examined.       1.  COLON FINDINGS: An anal fissure was noted at the midline posteriorly. There was a friable semipedunculated polyp at the anorectal junction.  This was treated by band ligation.  A single band was applied to the base of the polyp.  Internal hemorrhoids were seen. The remainder of the sigmoid and rectum were normal.  Retroflexed views revealed internal hemorrhoid.    The scope was then withdrawn from the patient and the procedure terminated.  COMPLICATIONS: There were no complications.  ENDOSCOPIC IMPRESSION: 1.  anal fissure 2.  anal polyp-status post band ligation 3.  internal hemorrhoids  RECOMMENDATIONS: Anusol HC suppositories office visit one-month  REPEAT EXAM:   _______________________________ eSignedLouis Meckel, MD 08/16/2012 1:11 PM   CC:

## 2012-08-16 NOTE — Telephone Encounter (Signed)
Spoke with pharmacy

## 2012-08-17 ENCOUNTER — Encounter (HOSPITAL_COMMUNITY): Payer: Self-pay | Admitting: Gastroenterology

## 2012-09-01 ENCOUNTER — Other Ambulatory Visit (HOSPITAL_COMMUNITY): Payer: Self-pay | Admitting: Internal Medicine

## 2012-09-08 ENCOUNTER — Ambulatory Visit (HOSPITAL_COMMUNITY)
Admission: RE | Admit: 2012-09-08 | Discharge: 2012-09-08 | Disposition: A | Discharge status: Home / Self Care | Payer: Medicare Other | Source: Ambulatory Visit | Attending: Physician Assistant | Admitting: Physician Assistant

## 2012-09-08 ENCOUNTER — Other Ambulatory Visit (HOSPITAL_COMMUNITY): Payer: Self-pay | Admitting: Physician Assistant

## 2012-09-08 DIAGNOSIS — S73111A Iliofemoral ligament sprain of right hip, initial encounter: Secondary | ICD-10-CM

## 2012-09-08 DIAGNOSIS — M25559 Pain in unspecified hip: Secondary | ICD-10-CM | POA: Insufficient documentation

## 2012-09-10 ENCOUNTER — Other Ambulatory Visit: Payer: Self-pay | Admitting: Physician Assistant

## 2012-09-27 ENCOUNTER — Encounter: Payer: Self-pay | Admitting: Gastroenterology

## 2012-09-27 ENCOUNTER — Ambulatory Visit (INDEPENDENT_AMBULATORY_CARE_PROVIDER_SITE_OTHER): Payer: Medicare Other | Admitting: Gastroenterology

## 2012-09-27 VITALS — BP 118/60 | HR 80 | Ht 64.0 in | Wt 117.8 lb

## 2012-09-27 DIAGNOSIS — K648 Other hemorrhoids: Secondary | ICD-10-CM

## 2012-09-27 NOTE — Assessment & Plan Note (Addendum)
Excellent response to band ligation. She is asymptomatic except for the presence of single hemorrhoid. Plan no further therapy.

## 2012-09-27 NOTE — Progress Notes (Signed)
History of Present Illness:  Crystal Petersen has returned following band ligation of hemorrhoids. She is without pain, pruritus or bleeding. She is aware of the persistence of an internal hemorrhoid but is asymptomatic.    Review of Systems: Pertinent positive and negative review of systems were noted in the above HPI section. All other review of systems were otherwise negative.    Current Medications, Allergies, Past Medical History, Past Surgical History, Family History and Social History were reviewed in [employer] electronic medical record  Vital signs were reviewed in today's medical record. Physical Exam: General: Well developed , well nourished, no acute distress On rectal exam she has lax anal sphincter tone. A single internal hemorrhoid is seen.

## 2012-09-27 NOTE — Patient Instructions (Addendum)
Follow up as needed

## 2013-05-17 ENCOUNTER — Encounter: Payer: Self-pay | Admitting: *Deleted

## 2013-05-19 ENCOUNTER — Ambulatory Visit (INDEPENDENT_AMBULATORY_CARE_PROVIDER_SITE_OTHER): Payer: Medicare Other | Admitting: Emergency Medicine

## 2013-05-19 ENCOUNTER — Encounter: Payer: Self-pay | Admitting: Emergency Medicine

## 2013-05-19 VITALS — BP 102/60 | HR 78 | Temp 98.6°F | Resp 16 | Ht 63.0 in | Wt 126.0 lb

## 2013-05-19 DIAGNOSIS — I10 Essential (primary) hypertension: Secondary | ICD-10-CM

## 2013-05-19 DIAGNOSIS — E559 Vitamin D deficiency, unspecified: Secondary | ICD-10-CM

## 2013-05-19 DIAGNOSIS — E039 Hypothyroidism, unspecified: Secondary | ICD-10-CM

## 2013-05-19 DIAGNOSIS — E782 Mixed hyperlipidemia: Secondary | ICD-10-CM

## 2013-05-19 DIAGNOSIS — K5900 Constipation, unspecified: Secondary | ICD-10-CM

## 2013-05-19 LAB — CBC WITH DIFFERENTIAL/PLATELET
Basophils Absolute: 0 10*3/uL (ref 0.0–0.1)
Basophils Relative: 0 % (ref 0–1)
EOS PCT: 1 % (ref 0–5)
Eosinophils Absolute: 0.1 10*3/uL (ref 0.0–0.7)
HCT: 34.9 % — ABNORMAL LOW (ref 36.0–46.0)
Hemoglobin: 11.5 g/dL — ABNORMAL LOW (ref 12.0–15.0)
LYMPHS ABS: 1.1 10*3/uL (ref 0.7–4.0)
LYMPHS PCT: 11 % — AB (ref 12–46)
MCH: 26.9 pg (ref 26.0–34.0)
MCHC: 33 g/dL (ref 30.0–36.0)
MCV: 81.7 fL (ref 78.0–100.0)
Monocytes Absolute: 1 10*3/uL (ref 0.1–1.0)
Monocytes Relative: 10 % (ref 3–12)
Neutro Abs: 7.7 10*3/uL (ref 1.7–7.7)
Neutrophils Relative %: 78 % — ABNORMAL HIGH (ref 43–77)
Platelets: 332 10*3/uL (ref 150–400)
RBC: 4.27 MIL/uL (ref 3.87–5.11)
RDW: 14.8 % (ref 11.5–15.5)
WBC: 9.9 10*3/uL (ref 4.0–10.5)

## 2013-05-19 LAB — BASIC METABOLIC PANEL WITH GFR
BUN: 17 mg/dL (ref 6–23)
CALCIUM: 9.3 mg/dL (ref 8.4–10.5)
CO2: 27 meq/L (ref 19–32)
CREATININE: 0.71 mg/dL (ref 0.50–1.10)
Chloride: 101 mEq/L (ref 96–112)
GFR, Est African American: 87 mL/min
GFR, Est Non African American: 76 mL/min
Glucose, Bld: 95 mg/dL (ref 70–99)
Potassium: 4.7 mEq/L (ref 3.5–5.3)
Sodium: 137 mEq/L (ref 135–145)

## 2013-05-19 LAB — HEPATIC FUNCTION PANEL
ALBUMIN: 4.2 g/dL (ref 3.5–5.2)
ALT: 13 U/L (ref 0–35)
AST: 17 U/L (ref 0–37)
Alkaline Phosphatase: 52 U/L (ref 39–117)
Bilirubin, Direct: 0.1 mg/dL (ref 0.0–0.3)
Indirect Bilirubin: 0.4 mg/dL (ref 0.2–1.2)
Total Bilirubin: 0.5 mg/dL (ref 0.2–1.2)
Total Protein: 6.4 g/dL (ref 6.0–8.3)

## 2013-05-19 LAB — LIPID PANEL
Cholesterol: 213 mg/dL — ABNORMAL HIGH (ref 0–200)
HDL: 90 mg/dL (ref >39)
LDL Cholesterol: 104 mg/dL — ABNORMAL HIGH (ref 0–99)
TRIGLYCERIDES: 95 mg/dL (ref <150)
Total CHOL/HDL Ratio: 2.4 Ratio
VLDL: 19 mg/dL (ref 0–40)

## 2013-05-19 LAB — MAGNESIUM: Magnesium: 1.8 mg/dL (ref 1.5–2.5)

## 2013-05-19 LAB — TSH: TSH: 5.899 u[IU]/mL — ABNORMAL HIGH (ref 0.350–4.500)

## 2013-05-19 NOTE — Progress Notes (Signed)
Subjective:    Patient ID: Crystal Petersen, female    DOB: 12/06/1921, 80 y.o.   MRN: [id number]  HPI Comments: 80 yo female presents for 3 month F/U for HTN, Cholesterol, Hypothyroid, D. Deficient. BP has not been checked at home she denies any low BP episode. She is not sure if she is taking 1/2 of Norvasc she thinks it's a whole. She is eating descent and keeps busy for exercise. LAST LABS HGB 11.4 T234 L 128 A1C 5.4 D 53 MAG 2.0  She notes constipation chronically, but Miralax/ Colace helps. She denies any blood with stool only occasional pain if large hard BM.  Hypertension  Gastrophageal Reflux  Hyperlipidemia     Medication List       This list is accurate as of: 05/19/13 11:59 PM.  Always use your most recent med list.               amLODipine 10 MG tablet  Commonly known as:  NORVASC  Take 1/2 tablet daily     CAL-GEST ANTACID 500 MG chewable tablet  Generic drug:  calcium carbonate  Chew 1 tablet by mouth daily.     docusate sodium 100 MG capsule  Commonly known as:  COLACE  Take 100 mg by mouth 2 (two) times daily as needed.     ezetimibe 10 MG tablet  Commonly known as:  ZETIA  Take 10 mg by mouth daily.     labetalol 100 MG tablet  Commonly known as:  NORMODYNE  Take 100 mg by mouth 2 (two) times daily.     levothyroxine 100 MCG tablet  Commonly known as:  SYNTHROID, LEVOTHROID  - Take 100 mcg by mouth daily. UAD  Pt. States she take 174mcg T,TH,Sat., SUN  And 15mcg M,W,F  -      omeprazole 40 MG capsule  Commonly known as:  PRILOSEC  - Take 40 mg by mouth daily. 30 min before breakfast  -      polyethylene glycol packet  Commonly known as:  MIRALAX / GLYCOLAX  Take 17 g by mouth as needed.     Vitamin D 2000 UNITS tablet  Take 4,000 Units by mouth daily.       Allergies  Allergen Reactions  . Ace Inhibitors     Elevated potassium  . Fosamax [Alendronate Sodium]   . Metoclopramide Hcl Other (See Comments)    Pt. Not sure  . Reglan  [Metoclopramide]   . Statins Other (See Comments)    myalgia  . Sulfa Antibiotics   . Vesicare [Solifenacin]   . Vicodin [Hydrocodone-Acetaminophen] Other (See Comments)    "felt spaced out"  . Sulfonamide Derivatives Rash   Past Medical History  Diagnosis Date  . Hypertension   . Nonspecific abnormal electrocardiogram (ECG) (EKG)   . Stricture and stenosis of esophagus   . Esophageal reflux   . Diverticulosis of colon (without mention of hemorrhage)   . Benign neoplasm of colon   . Macular degeneration   . Osteoarthritis     osteroperosis  . Other and unspecified hyperlipidemia   . HTN (hypertension)   . Shingles   . Cystocele   . Gastroparesis   . Skin cancer (melanoma)   . Hypothyroidism   . Cancer     pre-melanoma  . Hemorrhoids       Review of Systems  Gastrointestinal: Positive for constipation.  All other systems reviewed and are negative.   BP 102/60  Pulse 78  Temp(Src) 98.6 F (37 C) (Temporal)  Resp 16  Ht 5\' 3"  (1.6 m)  Wt 126 lb (57.153 kg)  BMI 22.33 kg/m2     Objective:   Physical Exam  Nursing note and vitals reviewed. Constitutional: She is oriented to person, place, and time. She appears well-developed and well-nourished. No distress.  HENT:  Head: Normocephalic and atraumatic.  Right Ear: External ear normal.  Left Ear: External ear normal.  Nose: Nose normal.  Mouth/Throat: Oropharynx is clear and moist.  Eyes: Conjunctivae and EOM are normal.  Neck: Normal range of motion. Neck supple. No JVD present. No thyromegaly present.  Cardiovascular: Normal rate, regular rhythm, normal heart sounds and intact distal pulses.   Pulmonary/Chest: Effort normal and breath sounds normal.  Abdominal: Soft. Bowel sounds are normal. She exhibits no distension and no mass. There is no tenderness. There is no rebound and no guarding.  Musculoskeletal: Normal range of motion. She exhibits no edema and no tenderness.  Lymphadenopathy:    She has no  cervical adenopathy.  Neurological: She is alert and oriented to person, place, and time. No cranial nerve deficit.  Skin: Skin is warm and dry. No rash noted. No erythema. No pallor.  Psychiatric: She has a normal mood and affect. Her behavior is normal. Judgment and thought content normal.          Assessment & Plan:  1.  3 month F/U for HTN, Cholesterol, Hypothyroid, D. Deficient. Needs healthy diet, cardio QD and obtain healthy weight. Check Labs, Check BP if >130/80 call office 2. Constipation- Increase fiber/ water intake, decrease caffeine, increase activity level, can add Miralax or Metamucil QD until BM soft

## 2013-05-19 NOTE — Patient Instructions (Addendum)
Hypotension  Decrease Norvasc( Amlodopine) to 1/2 tablet daily.  As your heart beats, it forces blood through your body. This force is called blood pressure. If you have hypotension, you have low blood pressure. When your blood pressure is too low, you may not get enough blood to your brain. You may feel weak, feel lightheaded, have a fast heartbeat, or even pass out (faint). HOME CARE  Drink enough fluids to keep your pee (urine) clear or pale yellow.  Take all medicines as told by your doctor.  Get up slowly after sitting or lying down.  Wear support stockings as told by your doctor.  Maintain a healthy diet by including foods such as fruits, vegetables, nuts, whole grains, and lean meats. GET HELP IF:  You are throwing up (vomiting) or have watery poop (diarrhea).  You have a fever for more than 2 3 days.  You feel more thirsty than usual.  You feel weak and tired. GET HELP RIGHT AWAY IF:   You pass out (faint).  You have chest pain or a fast or irregular heartbeat.  You lose feeling in part of your body.  You cannot move your arms or legs.  You have trouble speaking.  You get sweaty or feel lightheaded. MAKE SURE YOU:   Understand these instructions.  Will watch your condition.  Will get help right away if you are not doing well or get worse. Document Released: 06/25/2009 Document Revised: 12/01/2012 Document Reviewed: 10/01/2012 [facility] Patient Information 2014 [place]. Constipation, Adult Constipation is when a person:  Poops (bowel movement) less than 3 times a week.  Has a hard time pooping.  Has poop that is dry, hard, or bigger than normal. HOME CARE   Eat more fiber, such as fruits, vegetables, whole grains like brown rice, and beans.  Eat less fatty foods and sugar. This includes [place] fries, hamburgers, cookies, candy, and soda.  If you are not getting enough fiber from food, take products with added fiber in them  (supplements).  Drink enough fluid to keep your pee (urine) clear or pale yellow.  Go to the restroom when you feel like you need to poop. Do not hold it.  Only take medicine as told by your doctor. Do not take medicines that help you poop (laxatives) without talking to your doctor first.  Exercise on a regular basis, or as told by your doctor. GET HELP RIGHT AWAY IF:   You have bright red blood in your poop (stool).  Your constipation lasts more than 4 days or gets worse.  You have belly (abdomen) or butt (rectal) pain.  You have thin poop (as thin as a pencil).  You lose weight, and it cannot be explained. MAKE SURE YOU:   Understand these instructions.  Will watch your condition.  Will get help right away if you are not doing well or get worse. Document Released: 09/17/2007 Document Revised: 06/23/2011 Document Reviewed: 01/10/2013 [facility] Patient Information 2014 [place], [place].

## 2013-05-24 ENCOUNTER — Other Ambulatory Visit: Payer: Self-pay | Admitting: Internal Medicine

## 2013-05-24 MED ORDER — LEVOTHYROXINE SODIUM 100 MCG PO TABS: ORAL_TABLET | ORAL | Status: DC

## 2013-05-24 MED ORDER — LEVOTHYROXINE SODIUM 75 MCG PO TABS: ORAL_TABLET | ORAL | Status: DC

## 2013-05-26 ENCOUNTER — Telehealth: Payer: Self-pay | Admitting: *Deleted

## 2013-05-26 NOTE — Telephone Encounter (Signed)
Pt's Daughter [name] is calling to see if [facility] [address] has contacted [place] to verify doses & strengths & meds listed on our med sheet we gave pt while she was here at her last Appt. [name] was concerned pt was missing something or doses maybe not the same. She had dropped a copy off to [facility] to ask them to contact us in this matter so [name] was following up to see if this had been done?

## 2013-05-26 NOTE — Telephone Encounter (Signed)
Please let patients daughter know we have not heard from [facility] but she is more than welcome to come with her mother and bring all of her RX over here to discuss at [place]

## 2013-06-02 ENCOUNTER — Ambulatory Visit (INDEPENDENT_AMBULATORY_CARE_PROVIDER_SITE_OTHER): Payer: Medicare Other | Admitting: Emergency Medicine

## 2013-06-02 ENCOUNTER — Encounter: Payer: Self-pay | Admitting: Emergency Medicine

## 2013-06-02 VITALS — BP 118/62 | HR 74 | Temp 98.2°F | Resp 16 | Ht 63.0 in | Wt 127.0 lb

## 2013-06-02 DIAGNOSIS — R5383 Other fatigue: Secondary | ICD-10-CM

## 2013-06-02 DIAGNOSIS — E039 Hypothyroidism, unspecified: Secondary | ICD-10-CM

## 2013-06-02 DIAGNOSIS — R5381 Other malaise: Secondary | ICD-10-CM

## 2013-06-02 DIAGNOSIS — I10 Essential (primary) hypertension: Secondary | ICD-10-CM

## 2013-06-02 DIAGNOSIS — R42 Dizziness and giddiness: Secondary | ICD-10-CM

## 2013-06-02 LAB — CBC WITH DIFFERENTIAL/PLATELET
Basophils Absolute: 0 10*3/uL (ref 0.0–0.1)
Basophils Relative: 0 % (ref 0–1)
Eosinophils Absolute: 0.1 10*3/uL (ref 0.0–0.7)
Eosinophils Relative: 2 % (ref 0–5)
HEMATOCRIT: 35 % — AB (ref 36.0–46.0)
HEMOGLOBIN: 11.6 g/dL — AB (ref 12.0–15.0)
LYMPHS ABS: 1.5 10*3/uL (ref 0.7–4.0)
LYMPHS PCT: 20 % (ref 12–46)
MCH: 26.7 pg (ref 26.0–34.0)
MCHC: 33.1 g/dL (ref 30.0–36.0)
MCV: 80.5 fL (ref 78.0–100.0)
MONO ABS: 0.9 10*3/uL (ref 0.1–1.0)
MONOS PCT: 12 % (ref 3–12)
NEUTROS ABS: 4.8 10*3/uL (ref 1.7–7.7)
NEUTROS PCT: 66 % (ref 43–77)
Platelets: 334 10*3/uL (ref 150–400)
RBC: 4.35 MIL/uL (ref 3.87–5.11)
RDW: 14.6 % (ref 11.5–15.5)
WBC: 7.3 10*3/uL (ref 4.0–10.5)

## 2013-06-02 LAB — BASIC METABOLIC PANEL WITH GFR
BUN: 22 mg/dL (ref 6–23)
CO2: 29 meq/L (ref 19–32)
Calcium: 9.1 mg/dL (ref 8.4–10.5)
Chloride: 99 mEq/L (ref 96–112)
Creat: 0.77 mg/dL (ref 0.50–1.10)
GFR, Est African American: 79 mL/min
GFR, Est Non African American: 68 mL/min
Glucose, Bld: 101 mg/dL — ABNORMAL HIGH (ref 70–99)
POTASSIUM: 5.2 meq/L (ref 3.5–5.3)
SODIUM: 135 meq/L (ref 135–145)

## 2013-06-02 IMAGING — CR DG CHEST 2V
2 series · 2 of 2 positions shown · non-contrast
Comparison: 03/20/2007.

CLINICAL DATA: No chest complaints.  High blood pressure.

CHEST - 2 VIEW

[view not recorded (1 of 2)]
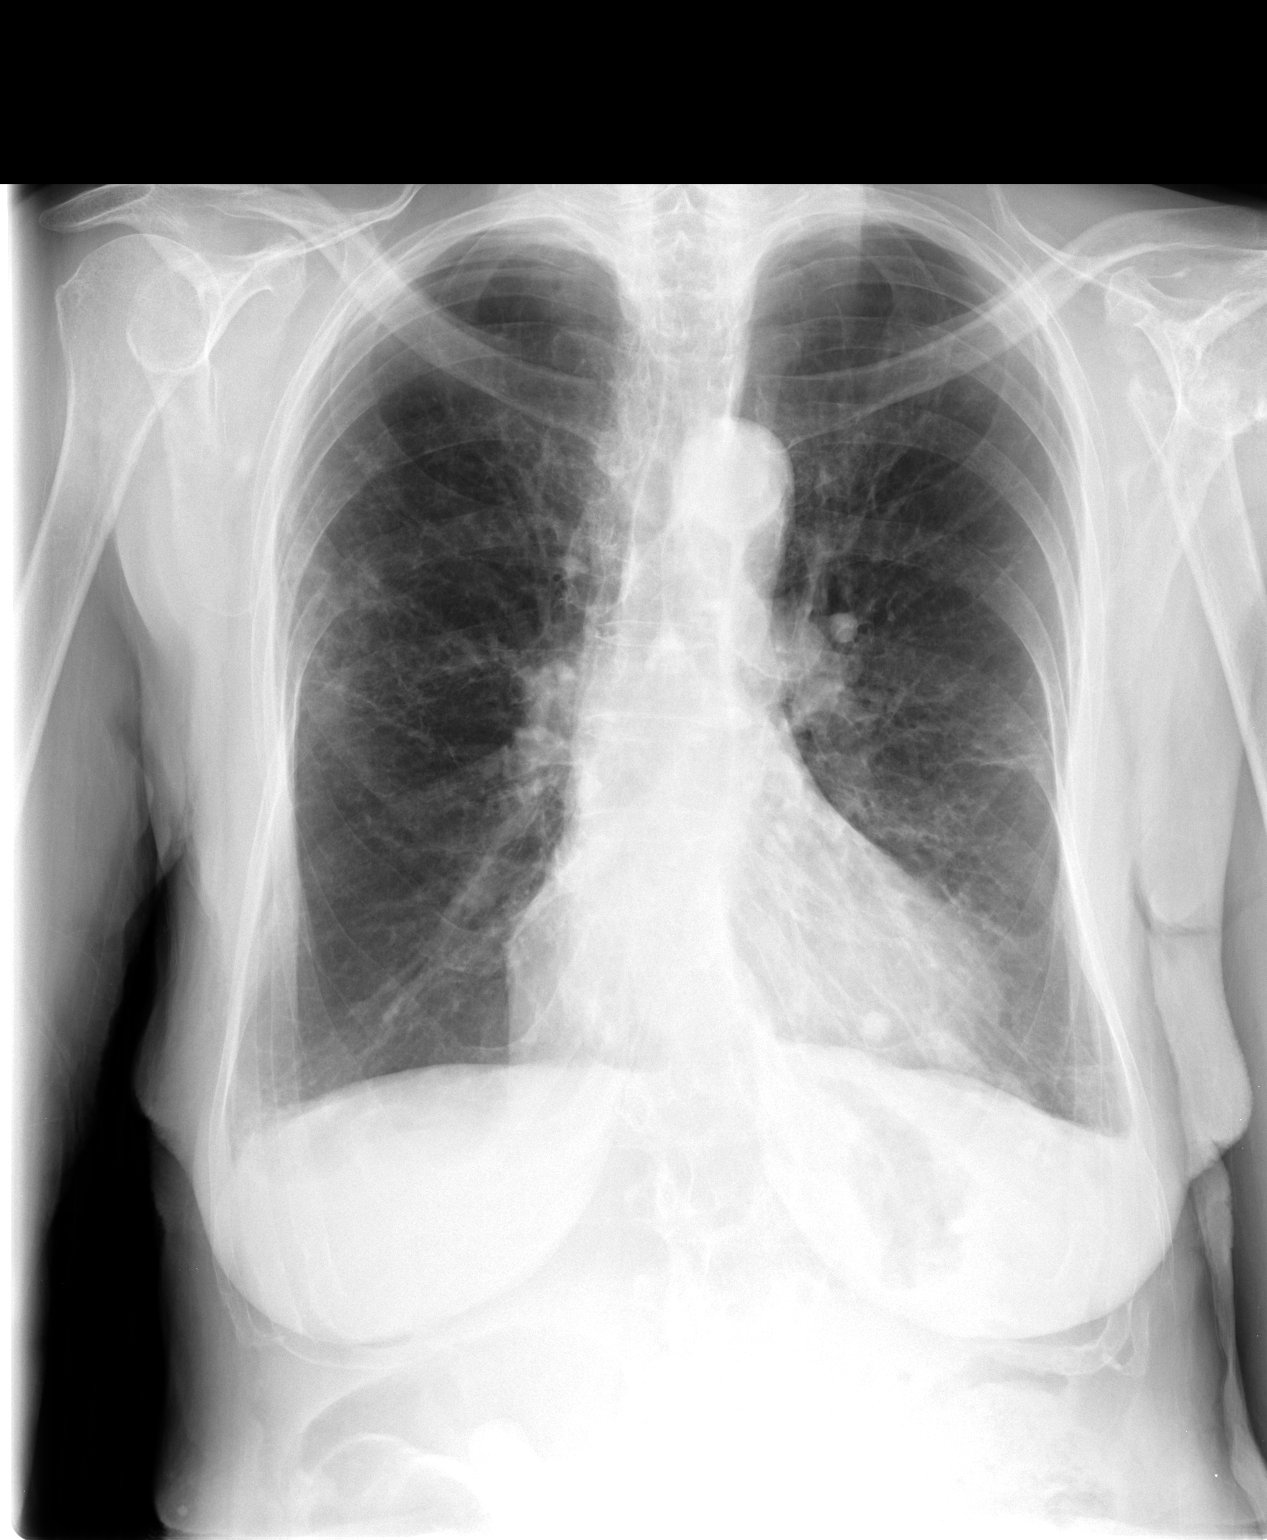

[view not recorded (2 of 2)]
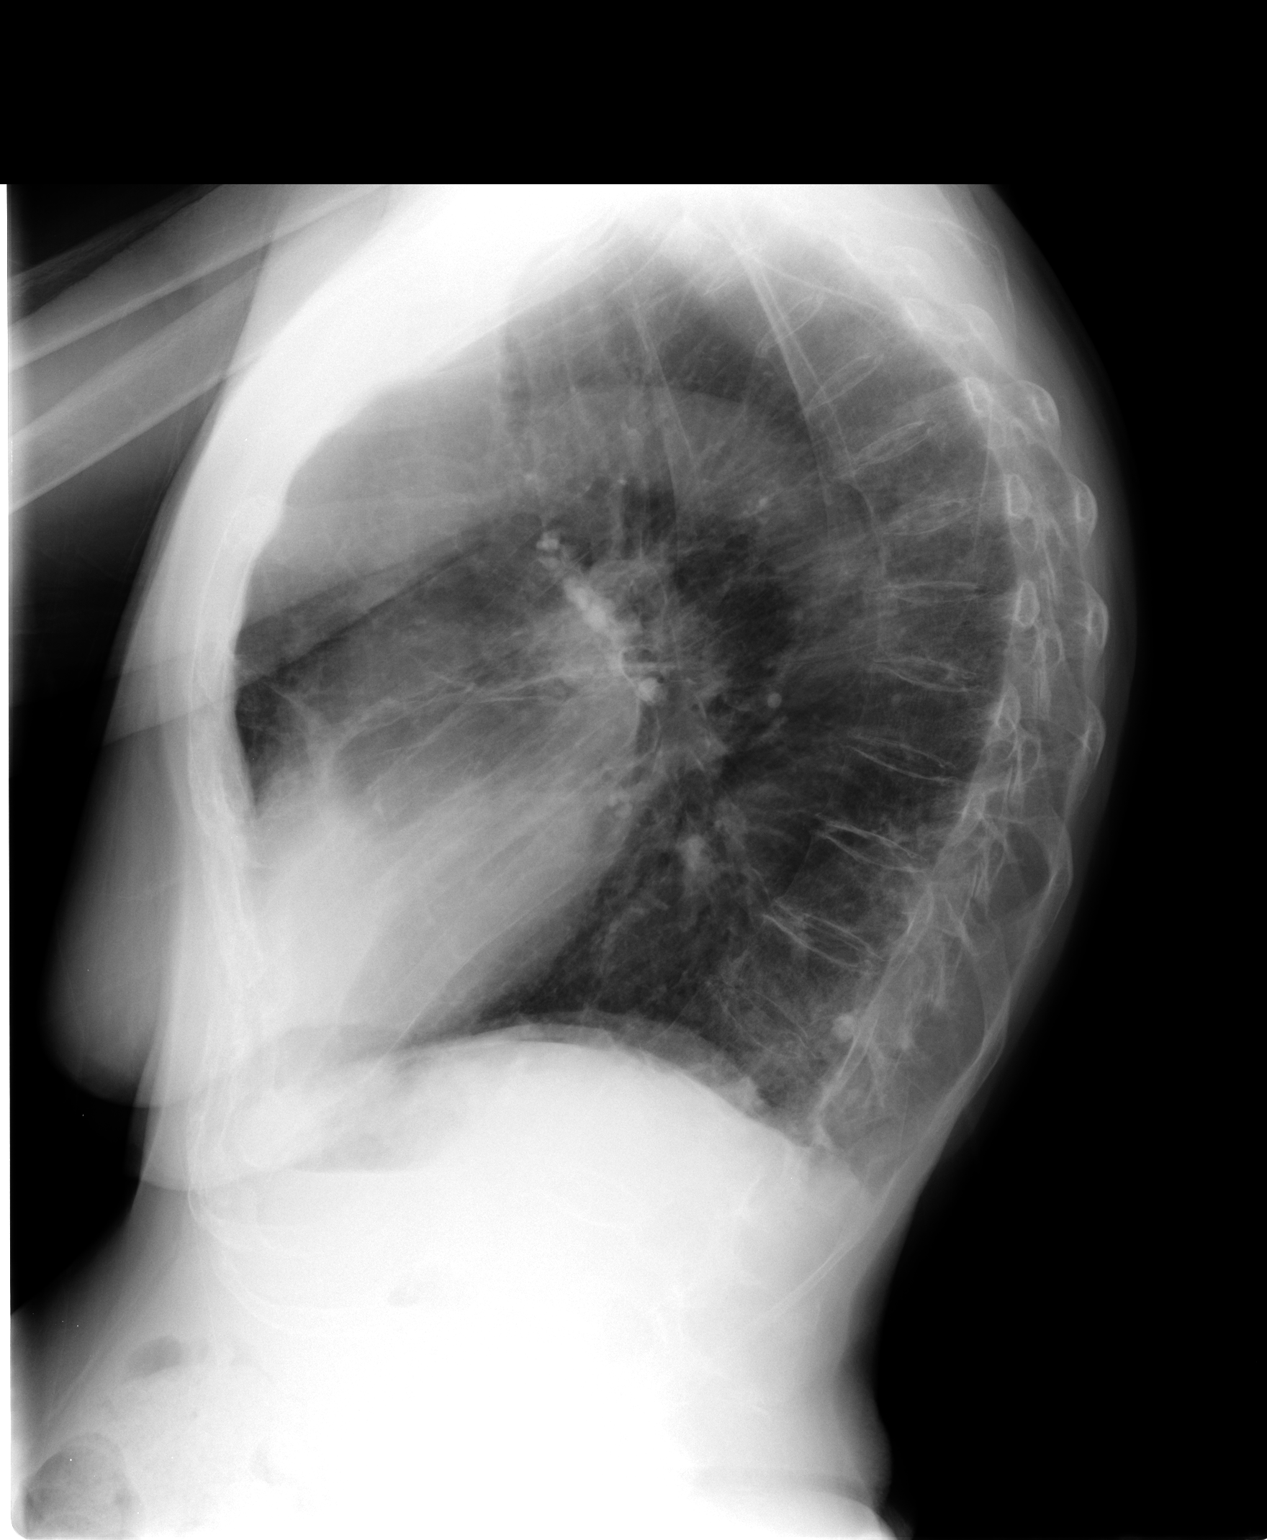

[2 of 2 positions shown; findings below may reference images not displayed]

FINDINGS: Increased lung markings peripheral aspect right upper
lobe minimally more notable than on prior exam.  My suspicion is
that this represents scarring rather than mass.  On the lateral
view, scarring/atelectasis lingula. Stability this findings can be
confirmed on follow-up.

Nodule left lung base unchanged.

No infiltrate, congestive heart failure or pneumothorax..

Central pulmonary vascular prominence stable.

Mild cardiomegaly.

Calcified slightly tortuous aorta.

Remote left humeral head fracture (seen on 1153 exam).
IMPRESSION: No acute abnormality.  Please see above.

## 2013-06-02 NOTE — Progress Notes (Signed)
Subjective:    Patient ID: Crystal Petersen, female    DOB: 12-20-1921, 80 y.o.   MRN: [id number]  HPI Comments: 80 yo female for recheck with HTN and dizziness. She thinks she was not on Norvasc at last OV and restarted 1/2 at last OV but our note had that she was laready on 1/2 of 10 mg. She notes she has been feeling more dizzy since last OV. She denies falls and wears alert monitor at home since she lives alone at [facility]. She does thinks the Thyroid dose noted at last OV was also incorrect, she notes she is on 60 mg a day. She notes she does take care of her medicines and she denies any issues with memory deficit.   Dizziness Associated symptoms include fatigue.     Medication List       This list is accurate as of: 06/02/13  9:06 PM.  Always use your most recent med list.               CAL-GEST ANTACID 500 MG chewable tablet  Generic drug:  calcium carbonate  Chew 1 tablet by mouth daily.     docusate sodium 100 MG capsule  Commonly known as:  COLACE  Take 100 mg by mouth 2 (two) times daily as needed.     ezetimibe 10 MG tablet  Commonly known as:  ZETIA  Take 10 mg by mouth daily.     labetalol 100 MG tablet  Commonly known as:  NORMODYNE  Take 100 mg by mouth 2 (two) times daily.     levothyroxine 75 MCG tablet  Commonly known as:  SYNTHROID, LEVOTHROID  Patient takes 100 mcg Tues, Thurs, Sat and Sun 75 mcg M,W,F     omeprazole 40 MG capsule  Commonly known as:  PRILOSEC  - Take 40 mg by mouth daily. 30 min before breakfast  -      polyethylene glycol packet  Commonly known as:  MIRALAX / GLYCOLAX  Take 17 g by mouth as needed.     Vitamin D 2000 UNITS tablet  Take 4,000 Units by mouth daily.       Allergies  Allergen Reactions  . Ace Inhibitors     Elevated potassium  . Fosamax [Alendronate Sodium]   . Metoclopramide Hcl Other (See Comments)    Pt. Not sure  . Reglan [Metoclopramide]   . Statins Other (See Comments)    myalgia  . Sulfa  Antibiotics   . Vesicare [Solifenacin]   . Vicodin [Hydrocodone-Acetaminophen] Other (See Comments)    "felt spaced out"  . Sulfonamide Derivatives Rash   Past Medical History  Diagnosis Date  . Hypertension   . Nonspecific abnormal electrocardiogram (ECG) (EKG)   . Stricture and stenosis of esophagus   . Esophageal reflux   . Diverticulosis of colon (without mention of hemorrhage)   . Benign neoplasm of colon   . Macular degeneration   . Osteoarthritis     osteroperosis  . Other and unspecified hyperlipidemia   . HTN (hypertension)   . Shingles   . Cystocele   . Gastroparesis   . Skin cancer (melanoma)   . Hypothyroidism   . Cancer     pre-melanoma  . Hemorrhoids       Review of Systems  Constitutional: Positive for fatigue.  Neurological: Positive for dizziness.  All other systems reviewed and are negative.  BP 118/62  Pulse 74  Temp(Src) 98.2 F (36.8 C) (Temporal)  Resp  16  Ht 5\' 3"  (1.6 m)  Wt 127 lb (57.607 kg)  BMI 22.50 kg/m2      Objective:   Physical Exam  Nursing note and vitals reviewed. Constitutional: She is oriented to person, place, and time. She appears well-developed and well-nourished. No distress.  HENT:  Head: Normocephalic and atraumatic.  Right Ear: External ear normal.  Left Ear: External ear normal.  Nose: Nose normal.  Mouth/Throat: Oropharynx is clear and moist. No oropharyngeal exudate.  Eyes: Conjunctivae and EOM are normal.  Neck: Normal range of motion. Neck supple. No JVD present. No thyromegaly present.  Cardiovascular: Normal rate, regular rhythm, normal heart sounds and intact distal pulses.   Pulmonary/Chest: Effort normal and breath sounds normal.  Abdominal: Soft. Bowel sounds are normal. She exhibits no distension and no mass. There is no tenderness. There is no rebound and no guarding.  Musculoskeletal: Normal range of motion. She exhibits no edema and no tenderness.  Lymphadenopathy:    She has no cervical  adenopathy.  Neurological: She is alert and oriented to person, place, and time. No cranial nerve deficit. Coordination normal.  Skin: Skin is warm and dry. No rash noted. No erythema. No pallor.  Psychiatric: She has a normal mood and affect. Her behavior is normal. Judgment and thought content normal.          Assessment & Plan:  1. HTN vs hypotension with dizziness- Check BP call if >130/80, increase cardio, clarify medication list. Will stop Norvasc 1/2 of 10 mg for now and w/c with results. Advised to increase fluids 2. Fatigue- check labs, increase activity and H2O vs Hypotension vs improperly taking RX, will verify all rx with pt over the phone. 3. Hypothyroid- recheck with abnormal lab and ? dosing

## 2013-06-02 NOTE — Patient Instructions (Signed)
Dizziness  STOP Norvasc! Call if any increase in symptoms.  Dizziness means you feel unsteady or lightheaded. You might feel like you are going to pass out (faint). HOME CARE   Drink enough fluids to keep your pee (urine) clear or pale yellow.  Take your medicines exactly as told by your doctor. If you take blood pressure medicine, always stand up slowly from the lying or sitting position. Hold on to something to steady yourself.  If you need to stand in one place for a long time, move your legs often. Tighten and relax your leg muscles.  Have someone stay with you until you feel okay.  Do not drive or use heavy machinery if you feel dizzy.  Do not drink alcohol. GET HELP RIGHT AWAY IF:   You feel dizzy or lightheaded and it gets worse.  You feel sick to your stomach (nauseous), or you throw up (vomit).  You have trouble talking or walking.  You feel weak or have trouble using your arms, hands, or legs.  You cannot think clearly or have trouble forming sentences.  You have chest pain, belly (abdominal) pain, sweating, or you are short of breath.  Your vision changes.  You are bleeding.  You have problems from your medicine that seem to be getting worse. MAKE SURE YOU:   Understand these instructions.  Will watch your condition.  Will get help right away if you are not doing well or get worse. Document Released: 03/20/2011 Document Revised: 06/23/2011 Document Reviewed: 03/20/2011 [facility] Patient Information 2014 [place], [place].

## 2013-06-03 LAB — TSH: TSH: 5.461 u[IU]/mL — ABNORMAL HIGH (ref 0.350–4.500)

## 2013-06-17 ENCOUNTER — Other Ambulatory Visit: Payer: Self-pay | Admitting: Internal Medicine

## 2013-07-05 ENCOUNTER — Encounter: Payer: Self-pay | Admitting: Internal Medicine

## 2013-07-05 ENCOUNTER — Ambulatory Visit (INDEPENDENT_AMBULATORY_CARE_PROVIDER_SITE_OTHER): Payer: Medicare Other | Admitting: Internal Medicine

## 2013-07-05 VITALS — BP 124/82 | HR 60 | Temp 98.4°F | Resp 16 | Ht 63.5 in | Wt 124.8 lb

## 2013-07-05 DIAGNOSIS — F0280 Dementia in other diseases classified elsewhere without behavioral disturbance: Secondary | ICD-10-CM

## 2013-07-05 DIAGNOSIS — G301 Alzheimer's disease with late onset: Secondary | ICD-10-CM

## 2013-07-05 DIAGNOSIS — I10 Essential (primary) hypertension: Secondary | ICD-10-CM

## 2013-07-05 DIAGNOSIS — G309 Alzheimer's disease, unspecified: Secondary | ICD-10-CM

## 2013-07-05 DIAGNOSIS — E782 Mixed hyperlipidemia: Secondary | ICD-10-CM

## 2013-07-05 DIAGNOSIS — E039 Hypothyroidism, unspecified: Secondary | ICD-10-CM

## 2013-07-05 HISTORY — DX: Essential (primary) hypertension: I10

## 2013-07-05 HISTORY — DX: Mixed hyperlipidemia: E78.2

## 2013-07-05 LAB — TSH: TSH: 0.308 u[IU]/mL — AB (ref 0.350–4.500)

## 2013-07-05 MED ORDER — AMLODIPINE BESYLATE 10 MG PO TABS: ORAL_TABLET | ORAL | Status: DC

## 2013-07-05 NOTE — Progress Notes (Signed)
Patient ID: Crystal Petersen, female   DOB: 29-Jul-1921, 80 y.o.   MRN: [id number]    This very nice 80 y.o. female presents for 3 month follow up with Hypertension, Hyperlipidemia, Pre-Diabetes and Vitamin D Deficiency. Patient also has Hypothyroidism and is uncertain as to what strength thyroid med she is taking   HTN predates many years. Today's BP is 124/82 mmHg at goal. She is somewhat unclear as to what medications she is actually taking for her BP. Patient denies any cardiac type chest pain, palpitations, dyspnea/orthopnea/PND, dizziness, claudication, or dependent edema.   Hyperlipidemia is controlled with diet & meds. Last Cholesterol was 213, Triglycerides were 95, HDL 90 and LDL 104 in Jan 2015 at goal. Patient denies myalgias or other med SE's.    Also, the patient has history of abnormal glucose 111 in 2013with last A1c of  5.4 in Jan 2015. Patient denies any symptoms of reactive hypoglycemia, diabetic polys, paresthesias or visual blurring.   Further, Patient has history of Vitamin D Deficiency with last vitamin D of 53 in Jan 2015. Patient supplements vitamin D without any suspected side-effects.  Medication Sig  . calcium carbonate (CAL-GEST ANTACID) 500 MG chewable tablet Chew 1 tablet by mouth daily.   . Cholecalciferol (VITAMIN D) 2000 UNITS tablet Take 4,000 Units by mouth daily.  Marland Kitchen docusate sodium (COLACE) 100 MG capsule Take 100 mg by mouth 2 (two) times daily as needed.   . ezetimibe (ZETIA) 10 MG tablet Take 10 mg by mouth daily.  Marland Kitchen labetalol (NORMODYNE) 100 MG tablet Take 100 mg by mouth 2 (two) times daily.    Marland Kitchen levothyroxine  75 MCG tablet Patient takes 100 mcg daily  . omeprazole (PRILOSEC) 40 MG capsule TAKE ONE CAP EVERY DAY FOR ACID REFLUX   Amlodipine 10 mg Takes 1/2 tab daily  . polyethylene glycol (MIRALAX / GLYCOLAX) p Take 17 g by mouth as needed.    Allergies  Allergen Reactions  . Ace Inhibitors     Elevated potassium  . Fosamax [Alendronate Sodium]    . Metoclopramide Hcl Other (See Comments)    Pt. Not sure  . Reglan [Metoclopramide]   . Statins Other (See Comments)    myalgia  . Sulfa Antibiotics   . Vesicare [Solifenacin]   . Vicodin [Hydrocodone-Acetaminophen] Other (See Comments)    "felt spaced out"  . Sulfonamide Derivatives Rash    PMHx:   Past Medical History  Diagnosis Date  . Hypertension   . Nonspecific abnormal electrocardiogram (ECG) (EKG)   . Stricture and stenosis of esophagus   . Esophageal reflux   . Diverticulosis of colon (without mention of hemorrhage)   . Benign neoplasm of colon   . Macular degeneration   . Osteoarthritis     osteroperosis  . Other and unspecified hyperlipidemia   . HTN (hypertension)   . Shingles   . Cystocele   . Gastroparesis   . Skin cancer (melanoma)   . Hypothyroidism   . Cancer     pre-melanoma  . Hemorrhoids     FHx:    Reviewed / unchanged  SHx:    Reviewed / unchanged   Systems Review: Constitutional: Denies fever, chills, wt changes, headaches, insomnia, fatigue, night sweats, change in appetite. Eyes: Denies redness, blurred vision, diplopia, discharge, itchy, watery eyes.  ENT: Denies discharge, congestion, post nasal drip, epistaxis, sore throat, earache, hearing loss, dental pain, tinnitus, vertigo, sinus pain, snoring.  CV: Denies chest pain, palpitations, irregular heartbeat, syncope, dyspnea, diaphoresis, orthopnea,  PND, claudication, edema. Respiratory: denies cough, dyspnea, DOE, pleurisy, hoarseness, laryngitis, wheezing.  Gastrointestinal: Denies dysphagia, odynophagia, heartburn, reflux, water brash, abdominal pain or cramps, nausea, vomiting, bloating, diarrhea, constipation, hematemesis, melena, hematochezia,  or hemorrhoids. Genitourinary: Denies dysuria, frequency, urgency, nocturia, hesitancy, discharge, hematuria, flank pain. Musculoskeletal: Denies arthralgias, myalgias, stiffness, jt. swelling, pain, limp, strain/sprain.  Skin: Denies  pruritus, rash, hives, warts, acne, eczema, change in skin lesion(s). Neuro: No weakness, tremor, incoordination, spasms, paresthesia, or pain. Psychiatric: Denies confusion, memory loss, or sensory loss. Endo: Denies change in weight, skin, hair change.  Heme/Lymph: No excessive bleeding, bruising, orenlarged lymph nodes.   Exam:  BP 124/82  Pulse 60  Temp 98.4 F   Resp 16  Ht 5' 3.5"   Wt 124 lb 12.8 oz   BMI 21.76 kg/m2  Appears well nourished - in no distress. Eyes: PERRLA, EOMs, conjunctiva no swelling or erythema. Sinuses: No frontal/maxillary tenderness ENT/Mouth: EAC's clear, TM's nl w/o erythema, bulging. Nares clear w/o erythema, swelling, exudates. Oropharynx clear without erythema or exudates. Oral hygiene is good. Tongue normal, non obstructing. Hearing intact.  Neck: Supple. Thyroid nl. Car 2+/2+ without bruits, nodes or JVD. Chest: Respirations nl with BS clear & equal w/o rales, rhonchi, wheezing or stridor.  Cor: Heart sounds normal w/ regular rate and rhythm without sig. murmurs, gallops, clicks, or rubs. Peripheral pulses normal and equal  without edema.  Abdomen: Soft & bowel sounds normal. Non-tender w/o guarding, rebound, hernias, masses, or organomegaly.  Lymphatics: Unremarkable.  Musculoskeletal: Full ROM all peripheral extremities, joint stability, 5/5 strength, and normal gait.  Skin: Warm, dry without exposed rashes, lesions, ecchymosis apparent.  Neuro: Cranial nerves intact, reflexes equal bilaterally. Sensory-motor testing grossly intact. Tendon reflexes grossly intact.  Pysch: Alert & oriented x 3. Insight and judgement nl & appropriate. No ideations.  Assessment and Plan:  1. Hypertension - Continue monitor blood pressure at home. Continue diet/meds same.  2. Hyperlipidemia - Continue diet/meds, exercise,& lifestyle modifications. Continue monitor periodic cholesterol/liver & renal functions   3. Pre-diabetes, screening - Continue diet, exercise,  lifestyle modifications. Monitor appropriate labs.  4. Vitamin D Deficiency - Continue supplementation.  5. Hypothyroidism -  Recommended regular exercise, BP monitoring, weight control, and discussed med and SE's. Recommended labs to assess and monitor clinical status. Further disposition pending results of labs.

## 2013-08-08 ENCOUNTER — Other Ambulatory Visit: Payer: Self-pay | Admitting: Internal Medicine

## 2013-08-15 ENCOUNTER — Telehealth: Payer: Self-pay | Admitting: Gastroenterology

## 2013-08-15 NOTE — Telephone Encounter (Signed)
Pt states she had some bright red blood on the toilet tissue when she used the bathroom today. Pt is concerned, states she has not seen this before. Pt scheduled to see Amy Esterwood PA 08/17/13@2pm . Pt aware of appt.

## 2013-08-17 ENCOUNTER — Ambulatory Visit: Payer: Medicare Other | Admitting: Physician Assistant

## 2013-08-23 ENCOUNTER — Ambulatory Visit (INDEPENDENT_AMBULATORY_CARE_PROVIDER_SITE_OTHER): Payer: Medicare Other | Admitting: Physician Assistant

## 2013-08-23 ENCOUNTER — Encounter: Payer: Self-pay | Admitting: Physician Assistant

## 2013-08-23 VITALS — BP 100/60 | HR 78 | Ht 63.5 in | Wt 123.6 lb

## 2013-08-23 DIAGNOSIS — K625 Hemorrhage of anus and rectum: Secondary | ICD-10-CM

## 2013-08-23 DIAGNOSIS — K648 Other hemorrhoids: Secondary | ICD-10-CM

## 2013-08-23 MED ORDER — HYDROCORTISONE ACETATE 25 MG RE SUPP: RECTAL | Status: DC

## 2013-08-23 NOTE — Patient Instructions (Signed)
We sent a prescription to [place] for the Anusol HC Suppositories.   Use 1 at bedtime for 14 nights then as needed.   Stay on the Miralax and Colace stool softners regularly. Call back if bleeding is persisting or worsening.

## 2013-08-23 NOTE — Progress Notes (Signed)
Reviewed and agree with management. Loyce Klasen D. Amyri Frenz, M.D., FACG  

## 2013-08-23 NOTE — Progress Notes (Signed)
Subjective:    Patient ID: Crystal Petersen, female    DOB: 02-Feb-1922, 80 y.o.   MRN: [id number]  HPI Crystal Petersen is a pleasant 80 year old white female known to Dr. Deatra Ina. She has history of chronic GERD, esophageal stricture, senile dementia, hypothyroidism, hypertension, hyperlipidemia, and diverticular disease. She had been seen in the past 4 polyps and internal hemorrhoids. She was last seen about a year ago and underwent flexible sigmoidoscopy in May of 2014 for band ligation of internal hemorrhoids. She was noted to have an anal fissure and had an anal polyp which was banded. Polyp tissue was not sent for path. She had also undergone banding procedure in 2013x3.  Patient comes in today stating she's been noticing small amounts of bright red blood on the tissue intermittently over  one month  . She says she has not seen blood daily and has not noted any clots. She says the blood is a small amount. She is no associated abdominal pain or rectal pain. She does have problems with chronic constipation which she manages with MiraLax and Colace. She says she doesn't take it every day but feels that it  works well when she does take it. She's having at least a couple of bowel movements per week. She denies any straining or hard stools.    Review of Systems  Constitutional: Negative.   HENT: Negative.   Eyes: Negative.   Respiratory: Negative.   Cardiovascular: Negative.   Gastrointestinal: Positive for anal bleeding.  Endocrine: Negative.   Genitourinary: Negative.   Musculoskeletal: Positive for arthralgias and gait problem.  Skin: Negative.   Allergic/Immunologic: Negative.   Neurological: Negative.   Hematological: Negative.   Psychiatric/Behavioral: Negative.    Outpatient Prescriptions Prior to Visit  Medication Sig Dispense Refill  . amLODipine (NORVASC) 10 MG tablet TAKE 1 TABLET BY MOUTH EVERY DAY FOR BP  90 tablet  1  . calcium carbonate (CAL-GEST ANTACID) 500 MG chewable tablet  Chew 1 tablet by mouth as needed.       . Cholecalciferol (VITAMIN D) 2000 UNITS tablet Take 4,000 Units by mouth daily.      Marland Kitchen docusate sodium (COLACE) 100 MG capsule Take 100 mg by mouth 2 (two) times daily as needed.       . ezetimibe (ZETIA) 10 MG tablet Take 10 mg by mouth daily.      Marland Kitchen labetalol (NORMODYNE) 100 MG tablet Take 100 mg by mouth 2 (two) times daily.        Marland Kitchen levothyroxine (SYNTHROID, LEVOTHROID) 75 MCG tablet Patient takes 100 mcg Tues, Thurs, Sat and Sun 75 mcg M,W,F      . omeprazole (PRILOSEC) 40 MG capsule TAKE ONE CAPSULE BY MOUTH EVERY DAY FOR ACID REFLUX  90 capsule  1  . polyethylene glycol (MIRALAX / GLYCOLAX) packet Take 17 g by mouth as needed.       No facility-administered medications prior to visit.   Allergies  Allergen Reactions  . Ace Inhibitors     Elevated potassium  . Fosamax [Alendronate Sodium]   . Metoclopramide Hcl Other (See Comments)    Pt. Not sure  . Reglan [Metoclopramide]   . Statins Other (See Comments)    myalgia  . Sulfa Antibiotics   . Vesicare [Solifenacin]   . Vicodin [Hydrocodone-Acetaminophen] Other (See Comments)    "felt spaced out"  . Sulfonamide Derivatives Rash   Patient Active Problem List   Diagnosis Date Noted  . Hyperlipidemia 07/05/2013  . Hypothyroidism 07/05/2013  .  Hypertension 07/05/2013  . SDAT (senile dementia of Alzheimer's type) 07/05/2013  . Anal polyp 08/16/2012  . Internal hemorrhoids without mention of complication 58/12/9831  . DIASTOLIC DYSFUNCTION 82/50/5397  . ELECTROCARDIOGRAM, ABNORMAL 08/10/2009  . STRICTURE AND STENOSIS OF ESOPHAGUS 10/27/2007  . GERD 10/25/2007  . ADENOMATOUS COLONIC POLYP 06/28/2004  . DIVERTICULOSIS, COLON 06/28/2004   History  Substance Use Topics  . Smoking status: Never Smoker   . Smokeless tobacco: Never Used  . Alcohol Use: No   family history includes Diabetes in her daughter; Esophageal cancer in her brother; Heart attack in her father; Heart disease in her  father; Hypertension in her father. There is no history of Colon cancer or Malignant hyperthermia.     Objective:   Physical Exam Well-developed elderly white female in no acute distress, pleasant blood pressure 100/60 pulse 78 height 5 foot 3 weight 123. HEENT; nontraumatic normocephalic EOMI PERRLA sclera anicteric, Supple; no JVD, Cardiovascular; regular rate and rhythm with S1-S2 no murmur or gallop, Pulmonary clear bilaterally, Abdomen; flat soft nontender nondistended bowel sounds are active no palpable mass or hepatosplenomegaly, Recta;l exam no external lesion or hemorrhoids noted external sphincter is lax on digital exam no mass or abnormality felt stool is brown ,Hemoccult-positive, on anoscopy erythematous mucosa somewhat friable 1 internal hemorrhoid column, Extremities; no clubbing cyanosis or edema skin warm and dry, Psych; mood and affect appropriate     Assessment & Plan:   #81 80 year old female with small volume intermittent hematochezia likely secondary to friable internal hemorrhoid. She is not having significant bleeding or any pain #2 chronic constipation #3 status post prior banding procedures for internal hemorrhoids #4 history of colon polyps and anal polyp  Plan; Anusol HC suppositories at bedtime x2 weeks then as needed, patient is asked to call back if her symptoms are worsening, or fails to respond to Anusol suppositories Continue MiraLax and Colace on a scheduled basis for constipation 2 followup with Dr. Deatra Ina as needed

## 2013-10-01 ENCOUNTER — Other Ambulatory Visit: Payer: Self-pay | Admitting: Internal Medicine

## 2013-10-06 ENCOUNTER — Ambulatory Visit (INDEPENDENT_AMBULATORY_CARE_PROVIDER_SITE_OTHER): Payer: Medicare Other | Admitting: Physician Assistant

## 2013-10-06 ENCOUNTER — Encounter: Payer: Self-pay | Admitting: Physician Assistant

## 2013-10-06 VITALS — BP 110/60 | HR 76 | Temp 97.7°F | Resp 16 | Wt 124.0 lb

## 2013-10-06 DIAGNOSIS — M25551 Pain in right hip: Secondary | ICD-10-CM

## 2013-10-06 DIAGNOSIS — Z79899 Other long term (current) drug therapy: Secondary | ICD-10-CM

## 2013-10-06 DIAGNOSIS — E039 Hypothyroidism, unspecified: Secondary | ICD-10-CM

## 2013-10-06 DIAGNOSIS — Z9181 History of falling: Secondary | ICD-10-CM

## 2013-10-06 DIAGNOSIS — E559 Vitamin D deficiency, unspecified: Secondary | ICD-10-CM

## 2013-10-06 DIAGNOSIS — E782 Mixed hyperlipidemia: Secondary | ICD-10-CM

## 2013-10-06 DIAGNOSIS — I10 Essential (primary) hypertension: Secondary | ICD-10-CM

## 2013-10-06 DIAGNOSIS — M25559 Pain in unspecified hip: Secondary | ICD-10-CM

## 2013-10-06 DIAGNOSIS — Z1331 Encounter for screening for depression: Secondary | ICD-10-CM

## 2013-10-06 DIAGNOSIS — Z0000 Encounter for general adult medical examination without abnormal findings: Secondary | ICD-10-CM

## 2013-10-06 LAB — BASIC METABOLIC PANEL WITH GFR
BUN: 24 mg/dL — ABNORMAL HIGH (ref 6–23)
CO2: 29 meq/L (ref 19–32)
CREATININE: 0.82 mg/dL (ref 0.50–1.10)
Calcium: 9.3 mg/dL (ref 8.4–10.5)
Chloride: 100 mEq/L (ref 96–112)
GFR, EST NON AFRICAN AMERICAN: 63 mL/min
GFR, Est African American: 73 mL/min
Glucose, Bld: 105 mg/dL — ABNORMAL HIGH (ref 70–99)
POTASSIUM: 4.9 meq/L (ref 3.5–5.3)
Sodium: 133 mEq/L — ABNORMAL LOW (ref 135–145)

## 2013-10-06 LAB — HEPATIC FUNCTION PANEL
ALK PHOS: 50 U/L (ref 39–117)
ALT: 9 U/L (ref 0–35)
AST: 14 U/L (ref 0–37)
Albumin: 4.2 g/dL (ref 3.5–5.2)
BILIRUBIN INDIRECT: 0.3 mg/dL (ref 0.2–1.2)
BILIRUBIN TOTAL: 0.4 mg/dL (ref 0.2–1.2)
Bilirubin, Direct: 0.1 mg/dL (ref 0.0–0.3)
Total Protein: 6.3 g/dL (ref 6.0–8.3)

## 2013-10-06 LAB — LIPID PANEL
CHOL/HDL RATIO: 2.3 ratio
Cholesterol: 218 mg/dL — ABNORMAL HIGH (ref 0–200)
HDL: 93 mg/dL (ref >39)
LDL Cholesterol: 108 mg/dL — ABNORMAL HIGH (ref 0–99)
Triglycerides: 84 mg/dL (ref <150)
VLDL: 17 mg/dL (ref 0–40)

## 2013-10-06 LAB — CBC WITH DIFFERENTIAL/PLATELET
BASOS ABS: 0 10*3/uL (ref 0.0–0.1)
Basophils Relative: 0 % (ref 0–1)
EOS PCT: 1 % (ref 0–5)
Eosinophils Absolute: 0.1 10*3/uL (ref 0.0–0.7)
HEMATOCRIT: 35.4 % — AB (ref 36.0–46.0)
HEMOGLOBIN: 11.8 g/dL — AB (ref 12.0–15.0)
LYMPHS ABS: 1.7 10*3/uL (ref 0.7–4.0)
LYMPHS PCT: 23 % (ref 12–46)
MCH: 27.2 pg (ref 26.0–34.0)
MCHC: 33.3 g/dL (ref 30.0–36.0)
MCV: 81.6 fL (ref 78.0–100.0)
MONO ABS: 0.8 10*3/uL (ref 0.1–1.0)
Monocytes Relative: 11 % (ref 3–12)
Neutro Abs: 4.8 10*3/uL (ref 1.7–7.7)
Neutrophils Relative %: 65 % (ref 43–77)
Platelets: 346 10*3/uL (ref 150–400)
RBC: 4.34 MIL/uL (ref 3.87–5.11)
RDW: 15.1 % (ref 11.5–15.5)
WBC: 7.4 10*3/uL (ref 4.0–10.5)

## 2013-10-06 LAB — MAGNESIUM: Magnesium: 2 mg/dL (ref 1.5–2.5)

## 2013-10-06 NOTE — Patient Instructions (Signed)
Follow up with Dr. Jari Pigg for lip Phone: [phone] Can get mammogram if you want, you can schedule this only if you want.   Preventative Care for Adults - Female      MAINTAIN REGULAR HEALTH EXAMS:  A routine yearly physical is a good way to check in with your primary care provider about your health and preventive screening. It is also an opportunity to share updates about your health and any concerns you have, and receive a thorough all-over exam.   Most health insurance companies pay for at least some preventative services.  Check with your health plan for specific coverages.  WHAT PREVENTATIVE SERVICES DO WOMEN NEED?  Adult women should have their weight and blood pressure checked regularly.   Women age 4 and older should have their cholesterol levels checked regularly.  Women should be screened for cervical cancer with a Pap smear and pelvic exam beginning at either age 64, or 3 years after they become sexually activity.    Breast cancer screening generally begins at age 105 with a mammogram and breast exam by your primary care provider.    Beginning at age 53 and continuing to age 26, women should be screened for colorectal cancer.  Certain people may need continued testing until age 53.  Updating vaccinations is part of preventative care.  Vaccinations help protect against diseases such as the flu.  Osteoporosis is a disease in which the bones lose minerals and strength as we age. Women ages 68 and over should discuss this with their caregivers, as should women after menopause who have other risk factors.  Lab tests are generally done as part of preventative care to screen for anemia and blood disorders, to screen for problems with the kidneys and liver, to screen for bladder problems, to check blood sugar, and to check your cholesterol level.  Preventative services generally include counseling about diet, exercise, avoiding tobacco, drugs, excessive alcohol consumption,  and sexually transmitted infections.    GENERAL RECOMMENDATIONS FOR GOOD HEALTH:  Healthy diet:  Eat a variety of foods, including fruit, vegetables, animal or vegetable protein, such as meat, fish, chicken, and eggs, or beans, lentils, tofu, and grains, such as rice.  Drink plenty of water daily.  Decrease saturated fat in the diet, avoid lots of red meat, processed foods, sweets, fast foods, and fried foods.  Exercise:  Aerobic exercise helps maintain good heart health. At least 30-40 minutes of moderate-intensity exercise is recommended. For example, a brisk walk that increases your heart rate and breathing. This should be done on most days of the week.   Find a type of exercise or a variety of exercises that you enjoy so that it becomes a part of your daily life.  Examples are running, walking, swimming, water aerobics, and biking.  For motivation and support, explore group exercise such as aerobic class, spin class, Zumba, Yoga,or  martial arts, etc.    Set exercise goals for yourself, such as a certain weight goal, walk or run in a race such as a 5k walk/run.  Speak to your primary care provider about exercise goals.  Disease prevention:  If you smoke or chew tobacco, find out from your caregiver how to quit. It can literally save your life, no matter how long you have been a tobacco user. If you do not use tobacco, never begin.   Maintain a healthy diet and normal weight. Increased weight leads to problems with blood pressure and diabetes.   The Body Mass  Index or BMI is a way of measuring how much of your body is fat. Having a BMI above 27 increases the risk of heart disease, diabetes, hypertension, stroke and other problems related to obesity. Your caregiver can help determine your BMI and based on it develop an exercise and dietary program to help you achieve or maintain this important measurement at a healthful level.  High blood pressure causes heart and blood vessel problems.   Persistent high blood pressure should be treated with medicine if weight loss and exercise do not work.   Fat and cholesterol leaves deposits in your arteries that can block them. This causes heart disease and vessel disease elsewhere in your body.  If your cholesterol is found to be high, or if you have heart disease or certain other medical conditions, then you may need to have your cholesterol monitored frequently and be treated with medication.   Ask if you should have a cardiac stress test if your history suggests this. A stress test is a test done on a treadmill that looks for heart disease. This test can find disease prior to there being a problem.  Menopause can be associated with physical symptoms and risks. Hormone replacement therapy is available to decrease these. You should talk to your caregiver about whether starting or continuing to take hormones is right for you.   Osteoporosis is a disease in which the bones lose minerals and strength as we age. This can result in serious bone fractures. Risk of osteoporosis can be identified using a bone density scan. Women ages 55 and over should discuss this with their caregivers, as should women after menopause who have other risk factors. Ask your caregiver whether you should be taking a calcium supplement and Vitamin D, to reduce the rate of osteoporosis.   Avoid drinking alcohol in excess (more than two drinks per day).  Avoid use of street drugs. Do not share needles with anyone. Ask for professional help if you need assistance or instructions on stopping the use of alcohol, cigarettes, and/or drugs.  Brush your teeth twice a day with fluoride toothpaste, and floss once a day. Good oral hygiene prevents tooth decay and gum disease. The problems can be painful, unattractive, and can cause other health problems. Visit your dentist for a routine oral and dental check up and preventive care every 6-12 months.   Look at your skin regularly.  Use a  mirror to look at your back. Notify your caregivers of changes in moles, especially if there are changes in shapes, colors, a size larger than a pencil eraser, an irregular border, or development of new moles.  Safety:  Use seatbelts 100% of the time, whether driving or as a passenger.  Use safety devices such as hearing protection if you work in environments with loud noise or significant background noise.  Use safety glasses when doing any work that could send debris in to the eyes.  Use a helmet if you ride a bike or motorcycle.  Use appropriate safety gear for contact sports.  Talk to your caregiver about gun safety.  Use sunscreen with a SPF (or skin protection factor) of 15 or greater.  Lighter skinned people are at a greater risk of skin cancer. Don't forget to also wear sunglasses in order to protect your eyes from too much damaging sunlight. Damaging sunlight can accelerate cataract formation.   Practice safe sex. Use condoms. Condoms are used for birth control and to help reduce the spread of sexually  transmitted infections (or STIs).  Some of the STIs are gonorrhea (the clap), chlamydia, syphilis, trichomonas, herpes, HPV (human papilloma virus) and HIV (human immunodeficiency virus) which causes AIDS. The herpes, HIV and HPV are viral illnesses that have no cure. These can result in disability, cancer and death.   Keep carbon monoxide and smoke detectors in your home functioning at all times. Change the batteries every 6 months or use a model that plugs into the wall.   Vaccinations:  Stay up to date with your tetanus shots and other required immunizations. You should have a booster for tetanus every 10 years. Be sure to get your flu shot every year, since 5%-20% of the [place] population comes down with the flu. The flu vaccine changes each year, so being vaccinated once is not enough. Get your shot in the fall, before the flu season peaks.   Other vaccines to consider:  Human Papilloma  Virus or HPV causes cancer of the cervix, and other infections that can be transmitted from person to person. There is a vaccine for HPV, and females should get immunized between the ages of 73 and 38. It requires a series of 3 shots.   Pneumococcal vaccine to protect against certain types of pneumonia.  This is normally recommended for adults age 12 or older.  However, adults younger than 79 years old with certain underlying conditions such as diabetes, heart or lung disease should also receive the vaccine.  Shingles vaccine to protect against Varicella Zoster if you are older than age 53, or younger than 78 years old with certain underlying illness.  Hepatitis A vaccine to protect against a form of infection of the liver by a virus acquired from food.  Hepatitis B vaccine to protect against a form of infection of the liver by a virus acquired from blood or body fluids, particularly if you work in health care.  If you plan to travel internationally, check with your local health department for specific vaccination recommendations.  Cancer Screening:  Breast cancer screening is essential to preventive care for women. All women age 66 and older should perform a breast self-exam every month. At age 42 and older, women should have their caregiver complete a breast exam each year. Women at ages 6 and older should have a mammogram (x-ray film) of the breasts. Your caregiver can discuss how often you need mammograms.    Cervical cancer screening includes taking a Pap smear (sample of cells examined under a microscope) from the cervix (end of the uterus). It also includes testing for HPV (Human Papilloma Virus, which can cause cervical cancer). Screening and a pelvic exam should begin at age 45, or 3 years after a woman becomes sexually active. Screening should occur every year, with a Pap smear but no HPV testing, up to age 59. After age 81, you should have a Pap smear every 3 years with HPV testing, if no  HPV was found previously.   Most routine colon cancer screening begins at the age of 76. On a yearly basis, doctors may provide special easy to use take-home tests to check for hidden blood in the stool. Sigmoidoscopy or colonoscopy can detect the earliest forms of colon cancer and is life saving. These tests use a small camera at the end of a tube to directly examine the colon. Speak to your caregiver about this at age 94, when routine screening begins (and is repeated every 5 years unless early forms of pre-cancerous polyps or small growths are  found).

## 2013-10-06 NOTE — Progress Notes (Signed)
MEDICARE ANNUAL WELLNESS VISIT AND FOLLOW UP  Assessment:   1. Hypertension - CBC with Differential - BASIC METABOLIC PANEL WITH GFR - Hepatic function panel  2. Hypothyroidism Recent change in medications, check TSH  3. Hyperlipidemia - Lipid panel  4. Right hip pain Will also do fall assessment, has had an Xray 1 year ago with OA, if not better will refer to ortho - Ambulatory referral to Physical Therapy  5. Unspecified vitamin D deficiency - Vit D  25 hydroxy (rtn osteoporosis monitoring)  6. Encounter for long-term (current) use of other medications - Magnesium  7. Non healing ulcer left lip Follow up with Dr. Delman Cheadle   Plan:   During the course of the visit the patient was educated and counseled about appropriate screening and preventive services including:    Pneumococcal vaccine   Influenza vaccine  Td vaccine  Screening electrocardiogram  Screening mammography  Bone densitometry screening  Colorectal cancer screening  Diabetes screening  Glaucoma screening  Nutrition counseling   Advanced directives: given info/requested  Screening recommendations, referrals:  Vaccinations: Tdap vaccine not indicated Influenza vaccine not indicated Pneumococcal vaccine not indicated Shingles vaccine not indicated Hep B vaccine not indicated  Nutrition assessed and recommended  Colonoscopy declined Mammogram declined Pap smear not indicated Pelvic exam not indicated Recommended yearly ophthalmology/optometry visit for glaucoma screening and checkup Recommended yearly dental visit for hygiene and checkup Advanced directives - requested  Conditions/risks identified: BMI: Discussed weight loss, diet, and increase physical activity.  Increase physical activity: AHA recommends 150 minutes of physical activity a week.  Medications reviewed DEXA- declined Urinary Incontinence is an issue: discussed non pharmacology and pharmacology options.  Fall risk:  high- discussed PT, home fall assessment, medications.    Subjective:   Crystal Petersen is a 80 y.o. female who presents for Medicare Annual Wellness Visit and 3 month follow up on hypertension, prediabetes, hyperlipidemia, vitamin D def.  Date of last medicare wellness visit is unknown.   Her blood pressure has been controlled at home, today their BP is BP: 110/60 mmHg She does not workout. She denies chest pain, shortness of breath, dizziness.  She is on cholesterol medication, zetia and denies myalgias. Her cholesterol is at goal. The cholesterol last visit was:   Lab Results  Component Value Date   CHOL 213* 05/19/2013   HDL 90 05/19/2013   LDLCALC 104* 05/19/2013   TRIG 95 05/19/2013   CHOLHDL 2.4 05/19/2013   Last A1C in the office was: 5.4 Patient is on Vitamin D supplement. She is on thyroid medication. Her medication was changed last visit, Levothyroxine 100 mcg 1 tab on mon, wed and fri and 1/2 tab the other for days. . Patient denies palpitations and weight changes.  Lab Results  Component Value Date   TSH 0.308* 07/05/2013  She complains of right anterior hip pain which has been going on for 1 year, she has had anterior hip pain, decreased ROM. She did PT for it about a year ago that did help.   Names of Other Physician/Practitioners you currently use: 1. [facility]- here for primary care 2. Dr. Zadie Rhine eye doctor, last visit 5 months ago  Patient Care Team: Unk Pinto, MD as PCP - General (Internal Medicine) Hurman Horn, MD as Consulting Physician (Ophthalmology) Burnell Blanks, MD as Consulting Physician (Cardiology) Inda Castle, MD as Consulting Physician (Gastroenterology) Jari Pigg, MD as Consulting Physician (Dermatology)  Medication Review Current Outpatient Prescriptions on File  Prior to Visit  Medication Sig Dispense Refill  . amLODipine (NORVASC) 10 MG tablet TAKE 1 TABLET BY MOUTH EVERY DAY FOR BP  90  tablet  1  . calcium carbonate (CAL-GEST ANTACID) 500 MG chewable tablet Chew 1 tablet by mouth as needed.       . Cholecalciferol (VITAMIN D) 2000 UNITS tablet Take 4,000 Units by mouth daily.      Marland Kitchen docusate sodium (COLACE) 100 MG capsule Take 100 mg by mouth 2 (two) times daily as needed.       . ezetimibe (ZETIA) 10 MG tablet Take 10 mg by mouth daily.      . hydrocortisone (ANUSOL-HC) 25 MG suppository Use 1 suppository in the rectum at bedtime  14 suppository  3  . labetalol (NORMODYNE) 100 MG tablet TAKE 1 TABLET BY MOUTH TWICE A DAY FOR BP  180 tablet  1  . levothyroxine (SYNTHROID, LEVOTHROID) 75 MCG tablet Patient takes 100 mcg Tues, Thurs, Sat and Sun 75 mcg M,W,F      . omeprazole (PRILOSEC) 40 MG capsule TAKE ONE CAPSULE BY MOUTH EVERY DAY FOR ACID REFLUX  90 capsule  1  . polyethylene glycol (MIRALAX / GLYCOLAX) packet Take 17 g by mouth as needed.       No current facility-administered medications on file prior to visit.    Current Problems (verified) Patient Active Problem List   Diagnosis Date Noted  . Hyperlipidemia 07/05/2013  . Hypothyroidism 07/05/2013  . Hypertension 07/05/2013  . SDAT (senile dementia of Alzheimer's type) 07/05/2013  . Anal polyp 08/16/2012  . Internal hemorrhoids without mention of complication 82/50/5397  . DIASTOLIC DYSFUNCTION 67/34/1937  . ELECTROCARDIOGRAM, ABNORMAL 08/10/2009  . STRICTURE AND STENOSIS OF ESOPHAGUS 10/27/2007  . GERD 10/25/2007  . ADENOMATOUS COLONIC POLYP 06/28/2004  . DIVERTICULOSIS, COLON 06/28/2004    Screening Tests Health Maintenance  Topic Date Due  . Influenza Vaccine  11/12/2013  . Tetanus/tdap  02/20/2022  . Colonoscopy  08/17/2022  . Pneumococcal Polysaccharide Vaccine Age 79 And Over  Completed  . Zostavax  Completed     Immunization History  Administered Date(s) Administered  . Influenza-Unspecified 01/12/2013  . Pneumococcal-Unspecified 07/01/2012  . Td 05/27/2004  . Tdap 02/21/2012  . Zoster  04/15/2007    Preventative care: Last colonoscopy: 2006 Last mammogram: 2013 Last pap smear/pelvic exam: remote DEXA:2011 off fosamax 2010  Prior vaccinations: TD or Tdap: 2013  Influenza: 2014 Pneumococcal: 2014 Shingles/Zostavax: 2009  History reviewed: allergies, current medications, past family history, past medical history, past social history, past surgical history and problem list  Risk Factors: Osteoporosis: postmenopausal estrogen deficiency and dietary calcium and/or vitamin D deficiency History of fracture in the past year: no  Tobacco History  Substance Use Topics  . Smoking status: Never Smoker   . Smokeless tobacco: Never Used  . Alcohol Use: No   She does not smoke.  Patient is not a former smoker. Are there smokers in your home (other than you)?  No  Alcohol Current alcohol use: none  Caffeine Current caffeine use: coffee 1 /day  Exercise Current exercise: none  Nutrition/Diet Current diet: in general, a "healthy" diet    Cardiac risk factors: advanced age (older than 70 for men, 82 for women), dyslipidemia, hypertension and sedentary lifestyle.  Depression Screen (Note: if answer to either of the following is "Yes", a more complete depression screening is indicated)   Q1: Over the past two weeks, have you felt down, depressed or hopeless? No  Q2: Over  the past two weeks, have you felt little interest or pleasure in doing things? No  Have you lost interest or pleasure in daily life? No  Do you often feel hopeless? No  Do you cry easily over simple problems? No  Activities of Daily Living In your present state of health, do you have any difficulty performing the following activities?:  Driving? Yes Managing money?  No Feeding yourself? No Getting from bed to chair? No Climbing a flight of stairs? Yes Preparing food and eating?: No Bathing or showering? No Getting dressed: No Getting to the toilet? No Using the toilet:No Moving around  from place to place: Yes In the past year have you fallen or had a near fall?:No   Are you sexually active?  No  Do you have more than one partner?  No  Vision Difficulties: Yes, getting shots for mac degeneration  Hearing Difficulties: Yes Do you often ask people to speak up or repeat themselves? Yes Do you experience ringing or noises in your ears? Yes Do you have difficulty understanding soft or whispered voices? Yes  Cognition  Do you feel that you have a problem with memory?Yes  Do you often misplace items? Yes  Do you feel safe at home?  Yes, [facility] lives [facility]  Advanced directives Does patient have a Health Care Power of Attorney? 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 Yes Retired Marine scientist   Objective:   Blood pressure 110/60, pulse 76, temperature 97.7 F (36.5 C), resp. rate 16, weight 124 lb (56.246 kg). Body mass index is 21.62 kg/(m^2).  General appearance: alert, no distress, WD/WN,  female Cognitive Testing  Alert? Yes  Normal Appearance?Yes  Oriented to person? Yes  Place? Yes   Time? Yes  Recall of three objects?  2/3  Can perform simple calculations? Yes  Displays appropriate judgment?Yes  Can read the correct time from a watch face?Yes  HEENT: normocephalic, sclerae anicteric, TMs pearly, nares patent, no discharge or erythema, pharynx normal Oral cavity: MMM, no lesions, for 5-6 months left lip with non healing ulcerated erythematous nodule Neck: supple, no lymphadenopathy, no thyromegaly, no masses Heart: RRR, normal S1, S2, no murmurs Lungs: CTA bilaterally, no wheezes, rhonchi, or rales Abdomen: +bs, soft, non tender, non distended, no masses, no hepatomegaly, no splenomegaly Musculoskeletal: + right anterior hip pain with mild pain with flexion and internal/external rotation.  no swelling, no obvious deformity Extremities: no edema, no cyanosis, no clubbing Pulses: 2+ symmetric, upper and lower extremities, normal cap refill Neurological:  alert, oriented x 3, CN2-12 intact, strength slightly decreased equally upper extremities and lower extremities DTRs 2+ throughout, no cerebellar signs, gait antalgic with cane Psychiatric: normal affect, behavior normal, pleasant  Breast: defer Gyn: defer Rectal: defer  Medicare Attestation I have personally reviewed: The patient's medical and social history Their use of alcohol, tobacco or illicit drugs Their current medications and supplements The patient's functional ability including ADLs,fall risks, home safety risks, cognitive, and hearing and visual impairment Diet and physical activities Evidence for depression or mood disorders  The patient's weight, height, BMI, and visual acuity have been recorded in the chart.  I have made referrals, counseling, and provided education to the patient based on review of the above and I have provided the patient with a written personalized care plan for preventive services.     Vicie Mutters, PA-C   10/06/2013

## 2013-10-07 LAB — VITAMIN D 25 HYDROXY (VIT D DEFICIENCY, FRACTURES): VIT D 25 HYDROXY: 62 ng/mL (ref 30–89)

## 2013-10-07 LAB — TSH: TSH: 5.076 u[IU]/mL — AB (ref 0.350–4.500)

## 2013-10-21 ENCOUNTER — Ambulatory Visit (INDEPENDENT_AMBULATORY_CARE_PROVIDER_SITE_OTHER): Payer: Medicare Other | Admitting: Gastroenterology

## 2013-10-21 ENCOUNTER — Encounter: Payer: Self-pay | Admitting: Gastroenterology

## 2013-10-21 VITALS — BP 110/68 | HR 76 | Ht 61.75 in | Wt 124.2 lb

## 2013-10-21 DIAGNOSIS — K648 Other hemorrhoids: Secondary | ICD-10-CM

## 2013-10-21 DIAGNOSIS — K5900 Constipation, unspecified: Secondary | ICD-10-CM

## 2013-10-21 NOTE — Assessment & Plan Note (Signed)
Patient has mild chronic constipation which is well-controlled with MiraLax and Colace

## 2013-10-21 NOTE — Assessment & Plan Note (Signed)
I assured the patient that her mild constipation is not do to a small internal hemorrhoids.  From the standpoint of the hemorrhoid she is asymptomatic.  No therapy is required.

## 2013-10-21 NOTE — Progress Notes (Signed)
          History of Present Illness:  Crystal Petersen's has returned for evaluation of constipation.  She moves her bowels regularly with the help of Colace and MiraLax.  She is concerned that her mild constipation is due to an internal hemorrhoid.  She's undergone band ligation of hemorrhoids in the past.  She denies rectal pain, pruritus or leakage.    Review of Systems: Pertinent positive and negative review of systems were noted in the above HPI section. All other review of systems were otherwise negative.    Current Medications, Allergies, Past Medical History, Past Surgical History, Family History and Social History were reviewed in [facility] record  Vital signs were reviewed in today's medical record. Physical Exam: General: Well developed , well nourished, no acute distress On rectal exam there are no masses.  She has a  palpable internal hemorrhoid   See Assessment and Plan under Problem List

## 2013-10-21 NOTE — Patient Instructions (Signed)
Follow up as needed

## 2013-11-09 ENCOUNTER — Ambulatory Visit (INDEPENDENT_AMBULATORY_CARE_PROVIDER_SITE_OTHER): Payer: Medicare Other | Admitting: *Deleted

## 2013-11-09 DIAGNOSIS — E039 Hypothyroidism, unspecified: Secondary | ICD-10-CM

## 2013-11-09 NOTE — Progress Notes (Signed)
Patient ID: Crystal Petersen, female   DOB: 1921-05-01, 80 y.o.   MRN: [id number] Patient presents for 1 month recheck TSH.  I asked patient how she is currently taking levothyroxine rx, however, she states she knows she takes 250 mcg "certain days" and 75 mcg other days.  Patient will need to verify rx dosage and let us know so we can change dosage if necessary after receiving lab results.

## 2013-11-10 LAB — TSH: TSH: 5.003 u[IU]/mL — ABNORMAL HIGH (ref 0.350–4.500)

## 2013-11-14 ENCOUNTER — Other Ambulatory Visit: Payer: Self-pay | Admitting: Emergency Medicine

## 2013-11-15 ENCOUNTER — Other Ambulatory Visit: Payer: Self-pay | Admitting: Dermatology

## 2013-11-16 ENCOUNTER — Telehealth: Payer: Self-pay | Admitting: *Deleted

## 2013-11-17 ENCOUNTER — Ambulatory Visit (INDEPENDENT_AMBULATORY_CARE_PROVIDER_SITE_OTHER): Payer: Medicare Other | Admitting: Internal Medicine

## 2013-11-17 ENCOUNTER — Encounter: Payer: Self-pay | Admitting: Internal Medicine

## 2013-11-17 VITALS — BP 116/76 | HR 80 | Temp 99.0°F | Resp 16 | Ht 63.0 in | Wt 123.6 lb

## 2013-11-17 DIAGNOSIS — E559 Vitamin D deficiency, unspecified: Secondary | ICD-10-CM | POA: Insufficient documentation

## 2013-11-17 DIAGNOSIS — R7303 Prediabetes: Secondary | ICD-10-CM

## 2013-11-17 DIAGNOSIS — E039 Hypothyroidism, unspecified: Secondary | ICD-10-CM

## 2013-11-17 DIAGNOSIS — M76899 Other specified enthesopathies of unspecified lower limb, excluding foot: Secondary | ICD-10-CM

## 2013-11-17 DIAGNOSIS — M6580 Other synovitis and tenosynovitis, unspecified site: Secondary | ICD-10-CM

## 2013-11-17 HISTORY — DX: Prediabetes: R73.03

## 2013-11-17 HISTORY — DX: Vitamin D deficiency, unspecified: E55.9

## 2013-11-17 MED ORDER — LEVOTHYROXINE SODIUM 100 MCG PO TABS: 100.0000 ug | ORAL_TABLET | Freq: Every day | ORAL | Status: AC

## 2013-11-17 MED ORDER — PREDNISONE 20 MG PO TABS: ORAL_TABLET | ORAL | Status: DC

## 2013-11-17 NOTE — Progress Notes (Signed)
Patient to return in 3 weeks for TSH and advised to stay on thyroxine 100 mcg til lab visit (SEE BELOW)  Subjective:    Patient ID: Crystal Petersen, female    DOB: 1921-08-21, 80 y.o.   MRN: [id number]  HPI 80 yo WF with multiple medical problems brought in by daughter who supervises her meds & has concern whether patient alternating dose of thyroxine betw 75 and 100 mcg as advised.   And it seems as though patient has been taking 100 mcg dose every (? most) day(s). Patient is due in 3 weeks for f/u repeat TSH as the last value was slightly elevated.   Also, patient is c/o pains in rt groin exascerbated with activity ROM of the right hip. Current Outpatient Prescriptions on File Prior to Visit  Medication Sig Dispense Refill  . amLODipine (NORVASC) 10 MG tablet TAKE 1 TABLET BY MOUTH EVERY DAY FOR BP  90 tablet  1  . calcium carbonate (CAL-GEST ANTACID) 500 MG chewable tablet Chew 1 tablet by mouth as needed.       . Cholecalciferol (VITAMIN D) 2000 UNITS tablet Take 4,000 Units by mouth daily.      Marland Kitchen docusate sodium (COLACE) 100 MG capsule Take 100 mg by mouth 2 (two) times daily as needed.       . ezetimibe (ZETIA) 10 MG tablet Take 10 mg by mouth daily.      . hydrocortisone (ANUSOL-HC) 25 MG suppository Use 1 suppository in the rectum at bedtime  14 suppository  3  . labetalol (NORMODYNE) 100 MG tablet TAKE 1 TABLET BY MOUTH TWICE A DAY FOR BP  180 tablet  1  . omeprazole (PRILOSEC) 40 MG capsule TAKE ONE CAPSULE BY MOUTH EVERY DAY FOR ACID REFLUX  90 capsule  1  . polyethylene glycol (MIRALAX / GLYCOLAX) packet Take 17 g by mouth as needed.       No current facility-administered medications on file prior to visit.   Allergies  Allergen Reactions  . Ace Inhibitors     Elevated potassium  . Fosamax [Alendronate Sodium]   . Metoclopramide Hcl Other (See Comments)    Pt. Not sure  . Reglan [Metoclopramide]   . Statins Other (See Comments)    myalgia  . Sulfa Antibiotics   . Vesicare  [Solifenacin]   . Vicodin [Hydrocodone-Acetaminophen] Other (See Comments)    "felt spaced out"  . Sulfonamide Derivatives Rash   Past Medical History  Diagnosis Date  . Hypertension   . Nonspecific abnormal electrocardiogram (ECG) (EKG)   . Stricture and stenosis of esophagus   . Esophageal reflux   . Diverticulosis of colon (without mention of hemorrhage)   . Benign neoplasm of colon   . Macular degeneration   . Osteoarthritis     osteroperosis  . Other and unspecified hyperlipidemia   . HTN (hypertension)   . Shingles   . Cystocele   . Gastroparesis   . Skin cancer (melanoma)   . Hypothyroidism   . Cancer     pre-melanoma  . Hemorrhoids    Review of Systems  In addition to the HPI above,  No Fever-chills,  No Headache, No changes with Vision or hearing,  No problems swallowing food or Liquids,  No Chest pain or productive Cough or Shortness of Breath,  No Abdominal pain, No Nausea or Vomitting, Bowel movements are regular,  No Blood in stool or Urine,  No dysuria,  No new skin rashes or bruises,  No new  weakness, tingling, numbness in any extremity,  No recent weight loss,  No polyuria, polydypsia or polyphagia,  No significant Mental Stressors.  A full 10 point Review of Systems was done, except as stated above, all other Review of Systems were negative  Objective:   Physical Exam  BP 116/76  Pulse 80  Temp(Src) 99 F (37.2 C) (Temporal)  Resp 16  Ht 5\' 3"  (1.6 m)  Wt 123 lb 9.6 oz (56.065 kg)  BMI 21.90 kg/m2  HEENT - Eac's patent. TM's Nl. EOM's full. PERRLA. NasoOroPharynx clear. Neck - supple. Nl Thyroid. Carotids 2+ & No bruits, nodes, JVD Chest - Clear equal BS w/o Rales, rhonchi, wheezes. Cor - Nl HS. RRR w/o sig MGR. PP 1(+). No edema.  MS-  Generalized decrease in muscle power, tone and bulk. Gait limping. Pain with int/ext rotation of the right hip. No bursal tenderness. Exquisite tenderness at the pelvic insertion of the right thigh medial  adductor tendons Neuro - No obvious Cr N abnormalities. Sensory, motor and Cerebellar functions appear Nl w/o focal abnormalities. Mental status with decrease in ST recall.   Assessment & Plan:   1. Right Adductor tendonitis  - predniSONE  20 MG tablet; 1 tab 3 x day for 3 days, then 1 tab 2 x day for 3 days, then 1 tab 1 x day for 5 days   # 20 tablet; Refill: 0  2. Hypothyroidism  - levothyroxine (SYNTHROID) 100 MCG tablet; Take 1 tablet (100 mcg total)  daily TIL NEXT LAB TO EVALUATE TSH WITH KNOWN DOSING.

## 2013-11-17 NOTE — Telephone Encounter (Signed)
ERROR

## 2013-11-17 NOTE — Patient Instructions (Signed)
Adductor  Tendinitis Tendinitis is swelling and inflammation of the tendons. Tendons are band-like tissues that connect muscle to bone. Tendinitis commonly occurs in the: .  Hips (Adductor tendon of thigh) CAUSES Tendinitis is usually caused by overusing the tendon, muscles, and joints involved. When the tissue surrounding a tendon (synovium) becomes inflamed, it is called tenosynovitis. Tendinitis commonly develops in people whose jobs require repetitive motions. SYMPTOMS  Pain.  Tenderness.  Mild swelling. DIAGNOSIS Tendinitis is usually diagnosed by physical exam. Your health care provider may also order X-rays or other imaging tests. TREATMENT Your health care provider may recommend certain medicines or exercises for your treatment. HOME CARE INSTRUCTIONS    Avoid using the limb while the tendon is painful. Perform gentle range of motion exercises only as directed by your health care provider. Stop exercises if pain or discomfort increase, unless directed otherwise by your health care provider.  Only take over-the-counter or prescription medicines for pain, discomfort, or fever as directed by your health care provider. SEEK MEDICAL CARE IF:   Your pain and swelling increase.  You develop new, unexplained symptoms, especially increased numbness in the hands.

## 2013-12-12 ENCOUNTER — Ambulatory Visit: Payer: Self-pay

## 2013-12-20 ENCOUNTER — Ambulatory Visit: Payer: Self-pay

## 2013-12-27 ENCOUNTER — Ambulatory Visit (INDEPENDENT_AMBULATORY_CARE_PROVIDER_SITE_OTHER): Payer: Medicare Other

## 2013-12-27 DIAGNOSIS — Z23 Encounter for immunization: Secondary | ICD-10-CM

## 2013-12-27 DIAGNOSIS — E039 Hypothyroidism, unspecified: Secondary | ICD-10-CM

## 2013-12-27 NOTE — Progress Notes (Signed)
Patient ID: Crystal Petersen, female   DOB: 18-Feb-1922, 80 y.o.   MRN: [id number] Patient here today to recheck TSH. Patient verifies today that she is taking Levothyroxine 100 mcg seven days a week.

## 2013-12-28 ENCOUNTER — Telehealth: Payer: Self-pay

## 2013-12-28 LAB — TSH: TSH: 0.76 u[IU]/mL (ref 0.350–4.500)

## 2013-12-28 NOTE — Telephone Encounter (Signed)
Spoke with patient and she is aware of thyroid results, she was advised to continue to take her Levothyroxine 100 mcg one daily, I also returned call to [facility] [phone] as she attempted to reach the office yesterday at 3:05 pm, I left her a voice mail yesterday and again today to return call if needed, also left instructions regarding thyroid medications on her voicemail as well, ok to speak with her and leave messages per patients chart.

## 2013-12-29 ENCOUNTER — Other Ambulatory Visit: Payer: Self-pay | Admitting: Internal Medicine

## 2014-01-12 ENCOUNTER — Other Ambulatory Visit: Payer: Self-pay | Admitting: Internal Medicine

## 2014-02-06 ENCOUNTER — Encounter: Payer: Self-pay | Admitting: Emergency Medicine

## 2014-02-07 ENCOUNTER — Other Ambulatory Visit: Payer: Self-pay | Admitting: Internal Medicine

## 2014-02-07 MED ORDER — PREDNISONE 20 MG PO TABS: ORAL_TABLET | ORAL | Status: DC

## 2014-02-10 ENCOUNTER — Telehealth: Payer: Self-pay | Admitting: *Deleted

## 2014-02-10 NOTE — Telephone Encounter (Signed)
Daughter called and ask if OK for patient to take Tylenol PM with her prednisone.  She takes Tylenol PM in the evening for hip pain.  OK to take together per Dr Melford Aase.  Daughter aware.

## 2014-02-17 ENCOUNTER — Other Ambulatory Visit: Payer: Self-pay

## 2014-02-17 DIAGNOSIS — M25552 Pain in left hip: Secondary | ICD-10-CM

## 2014-02-22 ENCOUNTER — Encounter: Payer: Self-pay | Admitting: Physician Assistant

## 2014-02-22 ENCOUNTER — Ambulatory Visit (INDEPENDENT_AMBULATORY_CARE_PROVIDER_SITE_OTHER): Payer: Medicare Other | Admitting: Physician Assistant

## 2014-02-22 VITALS — BP 110/62 | HR 80 | Temp 97.7°F | Resp 16 | Ht 63.0 in | Wt 118.0 lb

## 2014-02-22 DIAGNOSIS — E559 Vitamin D deficiency, unspecified: Secondary | ICD-10-CM

## 2014-02-22 DIAGNOSIS — K5730 Diverticulosis of large intestine without perforation or abscess without bleeding: Secondary | ICD-10-CM

## 2014-02-22 DIAGNOSIS — G308 Other Alzheimer's disease: Secondary | ICD-10-CM

## 2014-02-22 DIAGNOSIS — K2100 Gastro-esophageal reflux disease with esophagitis, without bleeding: Secondary | ICD-10-CM

## 2014-02-22 DIAGNOSIS — R350 Frequency of micturition: Secondary | ICD-10-CM

## 2014-02-22 DIAGNOSIS — Z0000 Encounter for general adult medical examination without abnormal findings: Secondary | ICD-10-CM

## 2014-02-22 DIAGNOSIS — Z0001 Encounter for general adult medical examination with abnormal findings: Secondary | ICD-10-CM

## 2014-02-22 DIAGNOSIS — R6889 Other general symptoms and signs: Secondary | ICD-10-CM

## 2014-02-22 DIAGNOSIS — Z79899 Other long term (current) drug therapy: Secondary | ICD-10-CM

## 2014-02-22 DIAGNOSIS — Z9181 History of falling: Secondary | ICD-10-CM

## 2014-02-22 DIAGNOSIS — G301 Alzheimer's disease with late onset: Secondary | ICD-10-CM

## 2014-02-22 DIAGNOSIS — R0600 Dyspnea, unspecified: Secondary | ICD-10-CM

## 2014-02-22 DIAGNOSIS — E782 Mixed hyperlipidemia: Secondary | ICD-10-CM

## 2014-02-22 DIAGNOSIS — I5189 Other ill-defined heart diseases: Secondary | ICD-10-CM

## 2014-02-22 DIAGNOSIS — R7303 Prediabetes: Secondary | ICD-10-CM

## 2014-02-22 DIAGNOSIS — I10 Essential (primary) hypertension: Secondary | ICD-10-CM

## 2014-02-22 DIAGNOSIS — F0280 Dementia in other diseases classified elsewhere without behavioral disturbance: Secondary | ICD-10-CM

## 2014-02-22 DIAGNOSIS — I519 Heart disease, unspecified: Secondary | ICD-10-CM

## 2014-02-22 DIAGNOSIS — E039 Hypothyroidism, unspecified: Secondary | ICD-10-CM

## 2014-02-22 LAB — CBC WITH DIFFERENTIAL/PLATELET
BASOS ABS: 0 10*3/uL (ref 0.0–0.1)
Basophils Relative: 0 % (ref 0–1)
EOS PCT: 1 % (ref 0–5)
Eosinophils Absolute: 0.1 10*3/uL (ref 0.0–0.7)
HEMATOCRIT: 36.3 % (ref 36.0–46.0)
Hemoglobin: 12.3 g/dL (ref 12.0–15.0)
LYMPHS PCT: 11 % — AB (ref 12–46)
Lymphs Abs: 1.3 10*3/uL (ref 0.7–4.0)
MCH: 27.6 pg (ref 26.0–34.0)
MCHC: 33.9 g/dL (ref 30.0–36.0)
MCV: 81.4 fL (ref 78.0–100.0)
MONO ABS: 1.4 10*3/uL — AB (ref 0.1–1.0)
Monocytes Relative: 12 % (ref 3–12)
Neutro Abs: 9 10*3/uL — ABNORMAL HIGH (ref 1.7–7.7)
Neutrophils Relative %: 76 % (ref 43–77)
Platelets: 390 10*3/uL (ref 150–400)
RBC: 4.46 MIL/uL (ref 3.87–5.11)
RDW: 14.9 % (ref 11.5–15.5)
WBC: 11.9 10*3/uL — ABNORMAL HIGH (ref 4.0–10.5)

## 2014-02-22 LAB — HEPATIC FUNCTION PANEL
ALBUMIN: 4 g/dL (ref 3.5–5.2)
ALK PHOS: 70 U/L (ref 39–117)
ALT: 15 U/L (ref 0–35)
AST: 18 U/L (ref 0–37)
Bilirubin, Direct: 0.1 mg/dL (ref 0.0–0.3)
Indirect Bilirubin: 0.5 mg/dL (ref 0.2–1.2)
TOTAL PROTEIN: 6.2 g/dL (ref 6.0–8.3)
Total Bilirubin: 0.6 mg/dL (ref 0.2–1.2)

## 2014-02-22 LAB — BASIC METABOLIC PANEL WITH GFR
BUN: 16 mg/dL (ref 6–23)
CHLORIDE: 98 meq/L (ref 96–112)
CO2: 28 mEq/L (ref 19–32)
Calcium: 9.7 mg/dL (ref 8.4–10.5)
Creat: 0.8 mg/dL (ref 0.50–1.10)
GFR, Est African American: 75 mL/min
GFR, Est Non African American: 65 mL/min
Glucose, Bld: 79 mg/dL (ref 70–99)
Potassium: 3.7 mEq/L (ref 3.5–5.3)
Sodium: 139 mEq/L (ref 135–145)

## 2014-02-22 LAB — LIPID PANEL
Cholesterol: 243 mg/dL — ABNORMAL HIGH (ref 0–200)
HDL: 98 mg/dL (ref >39)
LDL Cholesterol: 120 mg/dL — ABNORMAL HIGH (ref 0–99)
Total CHOL/HDL Ratio: 2.5 Ratio
Triglycerides: 124 mg/dL (ref <150)
VLDL: 25 mg/dL (ref 0–40)

## 2014-02-22 LAB — MAGNESIUM: Magnesium: 1.9 mg/dL (ref 1.5–2.5)

## 2014-02-22 LAB — TSH: TSH: 0.443 u[IU]/mL (ref 0.350–4.500)

## 2014-02-22 MED ORDER — AMLODIPINE BESYLATE 5 MG PO TABS: 5.0000 mg | ORAL_TABLET | Freq: Every day | ORAL | Status: DC

## 2014-02-22 NOTE — Patient Instructions (Addendum)
STOP the zetia, it is expensive and can worsen constipation.  CUT THE NORVASC/AMLODIPINE in half, take only 1/2 pill a day or 5 mg.  Can start 250mg  magnesium pill daily to help decrease constipation Can take Miralax up to 3 times day for bowel movement.

## 2014-02-22 NOTE — Progress Notes (Signed)
Complete Physical  Assessment and Plan: 1. Diastolic dysfunction Monitor closley, weight stable.   2. Essential hypertension Will cut norvasc in half, only 5 mg to decrease dizziness/falls- may discontinue in the future.  - CBC with Differential - BASIC METABOLIC PANEL WITH GFR - Hepatic function panel - Urinalysis, Routine w reflex microscopic - Microalbumin / creatinine urine ratio  3. Gastroesophageal reflux disease with esophagitis Continue prilosec  4. Diverticulosis of large intestine without hemorrhage + LLQ discomfort WITHOUT rebound-? Constipation- check CBC  5. Hypothyroidism, unspecified hypothyroidism type - TSH  6. SDAT (senile dementia of Alzheimer's type) Stable  7. Hyperlipidemia Stop zetia - Lipid panel  8. Prediabetes *Discussed general issues about diabetes pathophysiology and management., Educational material distributed., Suggested low cholesterol diet., Encouraged aerobic exercise., Discussed foot care., Reminded to get yearly retinal exam.  9. Medication management - Magnesium  10. Vitamin D deficiency Continue supplement  11. Urinary frequency - Urine culture  12. At high risk for falls Declines PT  13. Dyspnea ? Nerves/anxiety- check labs, EKG normal, will monitor, check labs - EKG 12-Lead   Discussed med's effects and SE's. Screening labs and tests as requested with regular follow-up as recommended.  HPI  80 y.o. female  presents for a complete physical.   Her blood pressure has been controlled at home, possibly TOO low with age, today their BP is BP: 110/62 mmHg She does not workout. She denies chest pain, shortness of breath, dizziness. However has some tachypnea while sitting with her, O2 stat is okay.   She is on cholesterol medication, she is on zetia and denies myalgias. Her cholesterol is at goal. The cholesterol last visit was:   Lab Results  Component Value Date   CHOL 218* 10/06/2013   HDL 93 10/06/2013   LDLCALC 108*  10/06/2013   TRIG 84 10/06/2013   CHOLHDL 2.3 10/06/2013   Patient is on Vitamin D supplement.   Lab Results  Component Value Date   VD25OH 13 10/06/2013    She is on thyroid medication. Her medication was changed last visit, 135mcg 1 table 4 days a week and 1/2 tab 3 days a week.. .  Lab Results  Component Value Date   TSH 0.760 12/27/2013  .   Patient resides at [facility], she does not drive due to [employer] and follows with Dr. Zadie Rhine for this.  She has a hard time hearing and did not wear her hearing aids today, not one is with her making the exam difficult. She complains of constipation, last BM 2-3 days, not hard stool but will take several days in between, she will occ take MOM/miralax at the NH which helps, she denies AB pain, fever, chills.   Current Medications:  Current Outpatient Prescriptions on File Prior to Visit  Medication Sig Dispense Refill  . acetaminophen (TYLENOL) 500 MG tablet Take 500 mg by mouth every 6 (six) hours as needed.    Marland Kitchen amLODipine (NORVASC) 10 MG tablet TAKE 1 TABLET EVERY DAY FOR BLOOD PRESSURE 90 tablet 1  . calcium carbonate (CAL-GEST ANTACID) 500 MG chewable tablet Chew 1 tablet by mouth as needed.     . Cholecalciferol (VITAMIN D) 2000 UNITS tablet Take 4,000 Units by mouth daily.    Marland Kitchen docusate sodium (COLACE) 100 MG capsule Take 100 mg by mouth 2 (two) times daily as needed.     . ezetimibe (ZETIA) 10 MG tablet Take 10 mg by mouth daily.    . hydrocortisone (ANUSOL-HC) 25 MG suppository  Use 1 suppository in the rectum at bedtime 14 suppository 3  . ibuprofen (ADVIL,MOTRIN) 200 MG tablet Take 200 mg by mouth every 6 (six) hours as needed.    . labetalol (NORMODYNE) 100 MG tablet TAKE 1 TABLET BY MOUTH TWICE A DAY FOR BP 180 tablet 1  . levothyroxine (SYNTHROID) 100 MCG tablet Take 1 tablet (100 mcg total) by mouth daily. 30 tablet 11  . omeprazole (PRILOSEC) 40 MG capsule TAKE ONE CAPSULE BY MOUTH EVERY DAY FOR ACID REFLUX 90 capsule 1  .  polyethylene glycol (MIRALAX / GLYCOLAX) packet Take 17 g by mouth as needed.    . predniSONE (DELTASONE) 20 MG tablet 1 tab 3 x day for 2 days, then 1 tab 2 x day for 2 days, then 1 tab 1 x day for 3 days 13 tablet 0   No current facility-administered medications on file prior to visit.   Health Maintenance:   Immunization History  Administered Date(s) Administered  . Influenza, High Dose Seasonal PF 12/27/2013  . Influenza-Unspecified 01/12/2013  . Pneumococcal-Unspecified 07/01/2012  . Td 05/27/2004  . Tdap 02/21/2012  . Zoster 04/15/2007   Last colonoscopy: 2006 will not get another Flex sig 08/2012 Last mammogram: 2013 Last pap smear/pelvic exam: remote DEXA:2011 off fosamax 2010  Prior vaccinations: TD or Tdap: 2013 Influenza: 12/2013 Pneumococcal: 2014 Prevnar DUE but declines Shingles/Zostavax: 2009  Last Dental Exam: NONE Last Eye Exam:Dr. Rankin- getting shots for Mac Degen  Patient Care Team: Unk Pinto, MD as PCP - General (Internal Medicine) Hurman Horn, MD as Consulting Physician (Ophthalmology) Burnell Blanks, MD as Consulting Physician (Cardiology) Inda Castle, MD as Consulting Physician (Gastroenterology) Jari Pigg, MD as Consulting Physician (Dermatology)  Allergies:  Allergies  Allergen Reactions  . Ace Inhibitors     Elevated potassium  . Fosamax [Alendronate Sodium]   . Metoclopramide Hcl Other (See Comments)    Pt. Not sure  . Reglan [Metoclopramide]   . Statins Other (See Comments)    myalgia  . Sulfa Antibiotics   . Vesicare [Solifenacin]   . Vicodin [Hydrocodone-Acetaminophen] Other (See Comments)    "felt spaced out"  . Sulfonamide Derivatives Rash   Medical History:  Past Medical History  Diagnosis Date  . Hypertension   . Nonspecific abnormal electrocardiogram (ECG) (EKG)   . Stricture and stenosis of esophagus   . Esophageal reflux   . Diverticulosis of colon (without mention of hemorrhage)   .  Benign neoplasm of colon   . Macular degeneration   . Osteoarthritis     osteroperosis  . Other and unspecified hyperlipidemia   . HTN (hypertension)   . Shingles   . Cystocele   . Gastroparesis   . Skin cancer (melanoma)   . Hypothyroidism   . Cancer     pre-melanoma  . Hemorrhoids    Surgical History:  Past Surgical History  Procedure Laterality Date  . Cholecystectomy    . Gallbladder surgery  2002  . Hemorrhoid surgery    . Appendectomy    . Thyroidectomy    . Left eye surgery    . Cataract extraction, bilateral    . Melenoma resection      right arm  . Bcc resection      legs/face  . Tonsillectomy    . Flexible sigmoidoscopy  05/12/2011    Procedure: FLEXIBLE SIGMOIDOSCOPY;  Surgeon: Inda Castle, MD;  Location: [facility];  Service: Endoscopy;  Laterality: N/A;  . Flexible sigmoidoscopy  09/10/2011  Procedure: FLEXIBLE SIGMOIDOSCOPY;  Surgeon: Inda Castle, MD;  Location: [facility];  Service: Endoscopy;  Laterality: N/A;  . Fracture surgery      left hip pin  . Flexible sigmoidoscopy N/A 08/16/2012    Procedure: FLEXIBLE SIGMOIDOSCOPY;  Surgeon: Inda Castle, MD;  Location: [facility];  Service: Endoscopy;  Laterality: N/A;  . Hemorrhoid banding N/A 08/16/2012    Procedure: HEMORRHOID BANDING;  Surgeon: Inda Castle, MD;  Location: [facility];  Service: Endoscopy;  Laterality: N/A;   Family History:  Family History  Problem Relation Age of Onset  . Esophageal cancer Brother   . Heart disease Father   . Hypertension Father   . Heart attack Father   . Colon cancer Neg Hx   . Malignant hyperthermia Neg Hx   . Diabetes Daughter    Social History:  History  Substance Use Topics  . Smoking status: Never Smoker   . Smokeless tobacco: Never Used  . Alcohol Use: No   Review of Systems: see HPI  Physical Exam: Estimated body mass index is 20.91 kg/(m^2) as calculated from the following:   Height as of this encounter: 5\' 3"  (1.6 m).   Weight  as of this encounter: 118 lb (53.524 kg). BP 110/62 mmHg  Pulse 80  Temp(Src) 97.7 F (36.5 C)  Resp 16  Ht 5\' 3"  (1.6 m)  Wt 118 lb (53.524 kg)  BMI 20.91 kg/m2 HEENT: normocephalic, sclerae anicteric, TMs pearly, nares patent, no discharge or erythema, pharynx normal Oral cavity: MMM, no lesions Neck: supple, no lymphadenopathy, no thyromegaly, no masses Heart: RRR, normal S1, S2, no murmurs Lungs: slight tachypnea CTA bilaterally, no wheezes, rhonchi, or rales Abdomen: +bs, soft, LLQ tenderness without rebound, + distended, no masses, no hepatomegaly, no splenomegaly Musculoskeletal: + right anterior hip pain with mild pain with flexion and internal/external rotation. no swelling, no obvious deformity Extremities: no edema, no cyanosis, no clubbing Pulses: 2+ symmetric, upper and lower extremities, normal cap refill Neurological: alert, oriented x 3, CN2-12 intact, strength slightly decreased equally upper extremities and lower extremities DTRs 2+ throughout, no cerebellar signs, gait antalgic with cane Psychiatric: normal affect, behavior normal, pleasant    EKG: WNL no changes diffuse changes   Vicie Mutters 10:28 AM [facility]

## 2014-02-23 ENCOUNTER — Telehealth: Payer: Self-pay

## 2014-02-23 NOTE — Telephone Encounter (Signed)
Called patients daughter [name] regarding lab results, she advised that patient is getting a Cortisone shot on the 20 th of November and a Hip Replacement on April 21, 2014, advised her that I would note chart and that the Ortho office would contact us closer to surgery date for any information needed

## 2014-03-02 ENCOUNTER — Other Ambulatory Visit: Payer: Self-pay | Admitting: Orthopedic Surgery

## 2014-03-15 ENCOUNTER — Encounter: Payer: Self-pay | Admitting: Physician Assistant

## 2014-03-15 ENCOUNTER — Ambulatory Visit (INDEPENDENT_AMBULATORY_CARE_PROVIDER_SITE_OTHER): Payer: Medicare Other | Admitting: Physician Assistant

## 2014-03-15 VITALS — BP 110/68 | HR 72 | Temp 97.9°F | Resp 16 | Ht 63.0 in | Wt 114.0 lb

## 2014-03-15 DIAGNOSIS — R7309 Other abnormal glucose: Secondary | ICD-10-CM

## 2014-03-15 DIAGNOSIS — F0280 Dementia in other diseases classified elsewhere without behavioral disturbance: Secondary | ICD-10-CM

## 2014-03-15 DIAGNOSIS — R7303 Prediabetes: Secondary | ICD-10-CM

## 2014-03-15 DIAGNOSIS — I5189 Other ill-defined heart diseases: Secondary | ICD-10-CM

## 2014-03-15 DIAGNOSIS — G301 Alzheimer's disease with late onset: Secondary | ICD-10-CM

## 2014-03-15 DIAGNOSIS — E782 Mixed hyperlipidemia: Secondary | ICD-10-CM

## 2014-03-15 DIAGNOSIS — I10 Essential (primary) hypertension: Secondary | ICD-10-CM

## 2014-03-15 DIAGNOSIS — K2100 Gastro-esophageal reflux disease with esophagitis, without bleeding: Secondary | ICD-10-CM

## 2014-03-15 DIAGNOSIS — M25551 Pain in right hip: Secondary | ICD-10-CM

## 2014-03-15 DIAGNOSIS — E039 Hypothyroidism, unspecified: Secondary | ICD-10-CM

## 2014-03-15 MED ORDER — TRAMADOL HCL 50 MG PO TABS: 50.0000 mg | ORAL_TABLET | Freq: Four times a day (QID) | ORAL | Status: DC | PRN

## 2014-03-15 NOTE — Patient Instructions (Addendum)
You should be getting a phone call from Dr. Angelena Form Phone: [phone];  Total Hip Replacement Total hip replacement is the replacement of your damaged hip with an artificial hip joint (prosthetic hip joint). The purpose of this surgery is to reduce pain and improve your hip function. LET [facility] CARE PROVIDER KNOW ABOUT:   Any allergies you have.  All medicines you are taking, including vitamins, herbs, eye drops, creams, and over-the-counter medicines.  Previous problems you or members of your family have had with the use of anesthetics.  Any blood disorders you have.  Previous surgeries you have had.  Medical conditions you have. RISKS AND COMPLICATIONS Generally, total hip replacement is a safe procedure. However, problems can occur and include:  Infection.  Dislocation (the ball of the hip-joint prosthesis comes out of contact with the socket).  Loosening of the stem connected to the ball or socket.  Fracture of the bone while inserting the prosthesis.  Formation of blood clots, which can break loose and travel to and injure your lungs (pulmonary embolus). BEFORE THE PROCEDURE   Do not eat or drink anything after midnight on the night before the procedure or as directed by your health care provider.  Ask your health care provider about changing or stopping your regular medicines. This is especially important if you are taking diabetes medicines or blood thinners. PROCEDURE  Just before the procedure, you will receive medicine that makes you drowsy (sedative) or a medicine that makes you fall asleep (general anesthetic). This will be given through a tube that is inserted into one of your veins (IV tube).  You will then receive a medicine injected into your spine that numbs your body below the waist (spinal anesthetic).  An incision is made in your hip. Your surgeon will take out any damaged cartilage and bone.  Next, your surgeon will insert a prosthetic socket  into your pelvic bone. This is usually secured with screws.  Your surgeon will then cut off the ball of your thigh bone (femur) and attach a prosthetic ball on a stem to your femur.  The surgeon will place the ball into the socket and check the range of motion of your new hip. AFTER THE PROCEDURE   You will be taken to a recovery area where a nurse will watch and check your progress.  Once you are awake and stable, you will be taken to a hospital room.  You will receive physical therapy until you are doing well and your health care provider feels it is safe for you to go home. Document Released: 07/07/2000 Document Revised: 08/15/2013 Document Reviewed: 06/01/2013 [facility] Patient Information 2015 [place], [place]. This information is not intended to replace advice given to you by your health care provider. Make sure you discuss any questions you have with your health care provider.

## 2014-03-15 NOTE — Progress Notes (Signed)
HPI 80 y.o.female with history of HTN, hypothyroidism, GERD,  And diastolic heart failure presents for surgical clearance. She is scheduled with Dr. Berenice Primas on 03/27/2014 for a right THR. Her daughter is here with her today. Last surgery was in her 13's, no known problem with anesthesia. She has some SOB with walking but denies CP, dizziness, diaphoresis, cough.   Past Medical History  Diagnosis Date  . Hypertension   . Nonspecific abnormal electrocardiogram (ECG) (EKG)   . Stricture and stenosis of esophagus   . Esophageal reflux   . Diverticulosis of colon (without mention of hemorrhage)   . Benign neoplasm of colon   . Macular degeneration   . Osteoarthritis     osteroperosis  . Other and unspecified hyperlipidemia   . HTN (hypertension)   . Shingles   . Cystocele   . Gastroparesis   . Skin cancer (melanoma)   . Hypothyroidism   . Cancer     pre-melanoma  . Hemorrhoids      Allergies  Allergen Reactions  . Ace Inhibitors     Elevated potassium  . Fosamax [Alendronate Sodium]   . Metoclopramide Hcl Other (See Comments)    Pt. Not sure  . Reglan [Metoclopramide]   . Statins Other (See Comments)    myalgia  . Sulfa Antibiotics   . Vesicare [Solifenacin]   . Vicodin [Hydrocodone-Acetaminophen] Other (See Comments)    "felt spaced out"  . Sulfonamide Derivatives Rash      Current Outpatient Prescriptions on File Prior to Visit  Medication Sig Dispense Refill  . acetaminophen (TYLENOL) 500 MG tablet Take 500 mg by mouth every 6 (six) hours as needed.    Marland Kitchen amLODipine (NORVASC) 5 MG tablet Take 1 tablet (5 mg total) by mouth daily. 30 tablet 1  . calcium carbonate (CAL-GEST ANTACID) 500 MG chewable tablet Chew 1 tablet by mouth as needed.     . Cholecalciferol (VITAMIN D) 2000 UNITS tablet Take 4,000 Units by mouth daily.    Marland Kitchen docusate sodium (COLACE) 100 MG capsule Take 100 mg by mouth 2 (two) times daily as needed.     . hydrocortisone (ANUSOL-HC) 25 MG suppository Use  1 suppository in the rectum at bedtime 14 suppository 3  . ibuprofen (ADVIL,MOTRIN) 200 MG tablet Take 200 mg by mouth every 6 (six) hours as needed.    . labetalol (NORMODYNE) 100 MG tablet TAKE 1 TABLET BY MOUTH TWICE A DAY FOR BP 180 tablet 1  . levothyroxine (SYNTHROID) 100 MCG tablet Take 1 tablet (100 mcg total) by mouth daily. 30 tablet 11  . omeprazole (PRILOSEC) 40 MG capsule TAKE ONE CAPSULE BY MOUTH EVERY DAY FOR ACID REFLUX 90 capsule 1  . polyethylene glycol (MIRALAX / GLYCOLAX) packet Take 17 g by mouth as needed.     No current facility-administered medications on file prior to visit.   Patient Care Team: Unk Pinto, MD as PCP - General (Internal Medicine) Hurman Horn, MD as Consulting Physician (Ophthalmology) Burnell Blanks, MD as Consulting Physician (Cardiology) Inda Castle, MD as Consulting Physician (Gastroenterology) Jari Pigg, MD as Consulting Physician (Dermatology)  ROS: all negative except above.   Physical Exam: Filed Weights   03/15/14 1410  Weight: 114 lb (51.71 kg)   BP 110/68 mmHg  Pulse 72  Temp(Src) 97.9 F (36.6 C)  Resp 16  Ht 5\' 3"  (1.6 m)  Wt 114 lb (51.71 kg)  BMI 20.20 kg/m2 General Appearance: Well nourished, in no apparent distress. Eyes: PERRLA,  EOMs, conjunctiva no swelling or erythema Sinuses: No Frontal/maxillary tenderness ENT/Mouth: Ext aud canals clear, TMs without erythema, bulging. No erythema, swelling, or exudate on post pharynx.  Tonsils not swollen or erythematous. Hearing decreased.  Neck: Supple, thyroid normal.  Respiratory: Respiratory effort normal, some decreased breath sounds diffuse without rales, rhonchi, wheezing or stridor.  Cardio: RRR with PVCs with prominent S2. Brisk peripheral pulses without edema.  Abdomen: Soft, + BS.  Non tender, no guarding, rebound, hernias, masses. Lymphatics: Non tender without lymphadenopathy.  Musculoskeletal: 4/5 strength, gate unsteady/antalgic with  cane Skin: Warm, dry without rashes, lesions, ecchymosis.  Neuro: Cranial nerves intact. Normal muscle tone, no cerebellar symptoms.  Psych: Awake and oriented X 2, normal affect, Insight and Judgment appropriate.    Assessment and Plan: HTN- ? Too low for her age, continue the labetolol but stop the norvasc for now, monitor at home, If BP above 150/90 add back on Norvasc Diastolic heart failure with surgery in 2 weeks- follow up with cardiology for surgical clearance- she is very high risk due to age and medical accompaniments.  Right hip pain with right THR scheduled in 2 weeks- long discussion with patient and daughter that soely due to age patient will be higher risk for complicaitons/death- they state they understand but that the patient's quality of life is affected due to pain- will increase tramadol to TID and send for cardiac clearance to Dr. Angelena Form.   Best number to reach daughter Wendelyn Breslow is [phone]  Vicie Mutters, PA-C 2:19 PM

## 2014-03-20 ENCOUNTER — Encounter (HOSPITAL_COMMUNITY)
Admission: RE | Admit: 2014-03-20 | Discharge: 2014-03-20 | Disposition: A | Discharge status: Home / Self Care | Payer: Medicare Other | Source: Ambulatory Visit | Attending: Orthopedic Surgery | Admitting: Orthopedic Surgery

## 2014-03-20 DIAGNOSIS — Z01818 Encounter for other preprocedural examination: Secondary | ICD-10-CM

## 2014-03-20 LAB — COMPREHENSIVE METABOLIC PANEL
ALT: 12 U/L (ref 0–35)
ANION GAP: 17 — AB (ref 5–15)
AST: 18 U/L (ref 0–37)
Albumin: 3.7 g/dL (ref 3.5–5.2)
Alkaline Phosphatase: 71 U/L (ref 39–117)
BILIRUBIN TOTAL: 0.3 mg/dL (ref 0.3–1.2)
BUN: 19 mg/dL (ref 6–23)
CHLORIDE: 95 meq/L — AB (ref 96–112)
CO2: 27 meq/L (ref 19–32)
Calcium: 9.5 mg/dL (ref 8.4–10.5)
Creatinine, Ser: 0.77 mg/dL (ref 0.50–1.10)
GFR, EST AFRICAN AMERICAN: 83 mL/min — AB (ref >90)
GFR, EST NON AFRICAN AMERICAN: 72 mL/min — AB (ref >90)
GLUCOSE: 100 mg/dL — AB (ref 70–99)
POTASSIUM: 3.3 meq/L — AB (ref 3.7–5.3)
SODIUM: 139 meq/L (ref 137–147)
Total Protein: 6.5 g/dL (ref 6.0–8.3)

## 2014-03-20 LAB — URINALYSIS, ROUTINE W REFLEX MICROSCOPIC
GLUCOSE, UA: NEGATIVE mg/dL
Ketones, ur: 15 mg/dL — AB
Nitrite: NEGATIVE
Protein, ur: 30 mg/dL — AB
SPECIFIC GRAVITY, URINE: 1.025 (ref 1.005–1.030)
UROBILINOGEN UA: 0.2 mg/dL (ref 0.0–1.0)
pH: 6 (ref 5.0–8.0)

## 2014-03-20 LAB — CBC WITH DIFFERENTIAL/PLATELET
Basophils Absolute: 0 10*3/uL (ref 0.0–0.1)
Basophils Relative: 0 % (ref 0–1)
EOS ABS: 0.1 10*3/uL (ref 0.0–0.7)
Eosinophils Relative: 1 % (ref 0–5)
HCT: 37 % (ref 36.0–46.0)
Hemoglobin: 11.9 g/dL — ABNORMAL LOW (ref 12.0–15.0)
LYMPHS ABS: 1.4 10*3/uL (ref 0.7–4.0)
LYMPHS PCT: 14 % (ref 12–46)
MCH: 26.9 pg (ref 26.0–34.0)
MCHC: 32.2 g/dL (ref 30.0–36.0)
MCV: 83.7 fL (ref 78.0–100.0)
MONOS PCT: 13 % — AB (ref 3–12)
Monocytes Absolute: 1.2 10*3/uL — ABNORMAL HIGH (ref 0.1–1.0)
NEUTROS PCT: 72 % (ref 43–77)
Neutro Abs: 6.9 10*3/uL (ref 1.7–7.7)
Platelets: 373 10*3/uL (ref 150–400)
RBC: 4.42 MIL/uL (ref 3.87–5.11)
RDW: 14.1 % (ref 11.5–15.5)
WBC: 9.6 10*3/uL (ref 4.0–10.5)

## 2014-03-20 LAB — URINE MICROSCOPIC-ADD ON

## 2014-03-20 LAB — SURGICAL PCR SCREEN
MRSA, PCR: NEGATIVE
STAPHYLOCOCCUS AUREUS: POSITIVE — AB

## 2014-03-20 LAB — PROTIME-INR
INR: 1.05 (ref 0.00–1.49)
Prothrombin Time: 13.9 seconds (ref 11.6–15.2)

## 2014-03-20 LAB — TYPE AND SCREEN
ABO/RH(D): O POS
Antibody Screen: NEGATIVE

## 2014-03-20 LAB — APTT: aPTT: 31 seconds (ref 24–37)

## 2014-03-20 LAB — ABO/RH: ABO/RH(D): O POS

## 2014-03-20 MED ORDER — CHLORHEXIDINE GLUCONATE 4 % EX LIQD: 60.0000 mL | Freq: Once | CUTANEOUS | Status: DC

## 2014-03-20 NOTE — Progress Notes (Signed)
I called a prescription for Mupirocin ointment to [facility], [place], [place], [place].

## 2014-03-20 NOTE — Pre-Procedure Instructions (Signed)
Jadine Brumley Cale  03/20/2014   Your procedure is scheduled on:  March 27, 2014  Report to [facility] Admitting at 9:50 AM.  Call this number if you have problems the morning of surgery: [phone]   Remember:   Do not eat food or drink liquids after midnight.   Take these medicines the morning of surgery with A SIP OF WATER: acetaminophen (TYLENOL) if needed, labetalol (NORMODYNE), levothyroxine (SYNTHROID), omeprazole (PRILOSEC), traMADol (ULTRAM) if needed  STOP aspirin, diet pills, herbal medications, Ibuprofen as of today   Do not wear jewelry, make-up or nail polish.  Do not wear lotions, powders, or perfumes. You may wear deodorant.  Do not shave 48 hours prior to surgery.   Do not bring valuables to the hospital.  [facility] is not responsible for any belongings or valuables.               Contacts, dentures or bridgework may not be worn into surgery.  Leave suitcase in the car. After surgery it may be brought to your room.  For patients admitted to the hospital, discharge time is determined by your  treatment team.               Patients discharged the day of surgery will not be allowed to drive home.  Name and phone number of your driver:     Please read over the following fact sheets that you were given: Pain Booklet, Coughing and Deep Breathing and Surgical Site Infection Prevention

## 2014-03-22 NOTE — Progress Notes (Addendum)
Anesthesia Chart Review: Patient is a 80 year old female scheduled for right THA, anterior approach on 03/27/14 by Dr. Berenice Primas.  History includes HTN, hypothyroidism, GERD, diastolic CHF, abnormal EKG (LAFB), osteoporosis, melanoma, HLD, macular degeneration, gastroparesis, thyroidectomy, cholecystectomy. She was seen by Vicie Mutters, PA-C (with Dr. Melford Aase) on 03/15/14 for a preoperative evaluation.  She recommended cardiology preoperatively evaluation as well due to "high risk due to age and medical accompaniments."; however, this is still pending. (Patient previously seen by Dr. Angelena Form in 2011/2012 with PRN follow-up.  Reported that his office could not work her in with him before her planned surgery date.)  Echo 09/08/09: LV cavity size was normal. Mild focal basal hypertrophy of the septum. Normal LV systolic function, EF 33-61%. Normal wall motion. Grade 1 diastolic dysfunction. Trivial AR. LA/RA mildly-moderately dilated. PA systolic pressure mildly increased. Small pericardial effusion.  EKG on 03/20/14 showed suspected arm lead reversal.  EKG on 02/22/14 showed: SR, LAFB, poor anterior r wave progression consider anterior infarct (age undetermined). Non-specific ST/T wave abnormality--may be due to baseline wanderer.  CXR 03/20/14:  IMPRESSION: 1. Severe emphysema. 2. Asymmetric left basilar opacities likely reflects scarring. This does not have the typical appearance of acute disease. Comparison with prior radiographs would be helpful. 3. No other acute disease. 4. Atherosclerosis. 5. Age indeterminate L2 compression fracture.  Preoperative labs noted.   Chart will be left for follow-up, pending cardiology evaluation.  George Hugh [facility] Short Stay Center/Anesthesiology Phone [phone] 03/22/2014 3:16 PM  Addendum: Patient was seen by cardiologist Dr. Harrington Challenger yesterday.  Patinet without active CV symptoms, but limited activity. Nuclear stress test was ordered to rule  out any high risk study.  Nuclear stress test 03/24/14: Overall Impression: Normal stress nuclear study. LV Wall Motion: NL LV Function; EF 70%. NL Wall Motion.  George Hugh [facility] Short Stay Center/Anesthesiology Phone [phone] 03/24/2014 4:03 PM

## 2014-03-23 ENCOUNTER — Encounter: Payer: Self-pay | Admitting: Internal Medicine

## 2014-03-23 ENCOUNTER — Ambulatory Visit (INDEPENDENT_AMBULATORY_CARE_PROVIDER_SITE_OTHER): Payer: Medicare Other | Admitting: Internal Medicine

## 2014-03-23 VITALS — BP 140/84 | HR 81 | Ht 63.0 in | Wt 112.4 lb

## 2014-03-23 DIAGNOSIS — Z01818 Encounter for other preprocedural examination: Secondary | ICD-10-CM

## 2014-03-23 NOTE — Progress Notes (Signed)
HPI Patinet is a 80 yo with history of HTN  Seens several years ago by Estevan Ryder   Presents for preop risk stratification. Patinet;s activity is very limited due to R hip  Daughter says she was more active several months ago but pain has worsened and now gets around very slowly Patinet denies CP  Breathing is OK  No PND  No syncope   Allergies  Allergen Reactions  . Ace Inhibitors     Elevated potassium  . Fosamax [Alendronate Sodium]   . Metoclopramide Hcl Other (See Comments)    Pt. Not sure  . Reglan [Metoclopramide]   . Statins Other (See Comments)    myalgia  . Sulfa Antibiotics   . Vesicare [Solifenacin]   . Vicodin [Hydrocodone-Acetaminophen] Other (See Comments)    "felt spaced out"  . Sulfonamide Derivatives Rash    Current Outpatient Prescriptions  Medication Sig Dispense Refill  . calcium carbonate (CAL-GEST ANTACID) 500 MG chewable tablet Chew 1 tablet by mouth as needed.     . Cholecalciferol (VITAMIN D) 2000 UNITS tablet Take 4,000 Units by mouth daily.    Marland Kitchen docusate sodium (COLACE) 100 MG capsule Take 100 mg by mouth 2 (two) times daily as needed.     . labetalol (NORMODYNE) 100 MG tablet TAKE 1 TABLET BY MOUTH TWICE A DAY FOR BP (Patient taking differently: Take 200 mg by mouth twice a day.) 180 tablet 1  . levothyroxine (SYNTHROID) 100 MCG tablet Take 1 tablet (100 mcg total) by mouth daily. 30 tablet 11  . mupirocin ointment (BACTROBAN) 2 % daily.  0  . omeprazole (PRILOSEC) 40 MG capsule TAKE ONE CAPSULE BY MOUTH EVERY DAY FOR ACID REFLUX (Patient taking differently: Take 40 mg by mouth daily) 90 capsule 1  . polyethylene glycol (MIRALAX / GLYCOLAX) packet Take 17 g by mouth as needed for mild constipation.     . traMADol (ULTRAM) 50 MG tablet Take 1 tablet (50 mg total) by mouth every 6 (six) hours as needed. 90 tablet 0  . ZETIA 10 MG tablet Take 10 mg by mouth daily.  10   No current facility-administered medications for this visit.    Past Medical History   Diagnosis Date  . Hypertension   . Nonspecific abnormal electrocardiogram (ECG) (EKG)   . Stricture and stenosis of esophagus   . Esophageal reflux   . Diverticulosis of colon (without mention of hemorrhage)   . Benign neoplasm of colon   . Macular degeneration   . Osteoarthritis     osteroperosis  . Other and unspecified hyperlipidemia   . HTN (hypertension)   . Shingles   . Cystocele   . Gastroparesis   . Skin cancer (melanoma)   . Hypothyroidism   . Cancer     pre-melanoma  . Hemorrhoids     Past Surgical History  Procedure Laterality Date  . Cholecystectomy    . Gallbladder surgery  2002  . Hemorrhoid surgery    . Appendectomy    . Thyroidectomy    . Left eye surgery    . Cataract extraction, bilateral    . Melenoma resection      right arm  . Bcc resection      legs/face  . Tonsillectomy    . Flexible sigmoidoscopy  05/12/2011    Procedure: FLEXIBLE SIGMOIDOSCOPY;  Surgeon: Inda Castle, MD;  Location: [facility];  Service: Endoscopy;  Laterality: N/A;  . Flexible sigmoidoscopy  09/10/2011    Procedure: FLEXIBLE SIGMOIDOSCOPY;  Surgeon: Inda Castle, MD;  Location: [facility];  Service: Endoscopy;  Laterality: N/A;  . Fracture surgery      left hip pin  . Flexible sigmoidoscopy N/A 08/16/2012    Procedure: FLEXIBLE SIGMOIDOSCOPY;  Surgeon: Inda Castle, MD;  Location: [facility];  Service: Endoscopy;  Laterality: N/A;  . Hemorrhoid banding N/A 08/16/2012    Procedure: HEMORRHOID BANDING;  Surgeon: Inda Castle, MD;  Location: [facility];  Service: Endoscopy;  Laterality: N/A;    Family History  Problem Relation Age of Onset  . Esophageal cancer Brother   . Heart disease Father   . Hypertension Father   . Heart attack Father   . Colon cancer Neg Hx   . Malignant hyperthermia Neg Hx   . Diabetes Daughter     History   Social History  . Marital Status: Widowed    Spouse Name: N/A    Number of Children: 42  . Years of Education: N/A    Occupational History  . retired    Social History Main Topics  . Smoking status: Never Smoker   . Smokeless tobacco: Never Used  . Alcohol Use: No  . Drug Use: No  . Sexual Activity: Not on file   Other Topics Concern  . 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    Pt does not get regular exercise          Review of Systems:  All systems reviewed.  They are negative to the above problem except as previously stated.  Vital Signs: BP 140/84 mmHg  Pulse 81  Ht 5\' 3"  (1.6 m)  Wt 112 lb 6.4 oz (50.984 kg)  BMI 19.92 kg/m2  SpO2 97%  Physical Exam  HEENT:  Normocephalic, atraumatic. EOMI, PERRLA.  Neck: JVP is normal.  No bruits.  Lungs: clear to auscultation. No rales no wheezes.  Heart: Regular rate and rhythm. Normal S1, S2. No S3.   No significant murmurs. PMI not displaced.  Abdomen:  Supple, nontender. Normal bowel sounds. No masses. No hepatomegaly.  Extremities:   Good distal pulses throughout. No lower extremity edema.  Musculoskeletal :moving all extremities.  Neuro:   alert and oriented x3.  CN II-XII grossly intact.  EKG  11/11  SR 66 bpm  Nonspecific ST changes  Assessment and Plan: 1.  Cardiac preop evaluation.  Patinet with no apparent active symptoms  But activty is very limitied.  Even before hip pain got bad she did not move that much I have recomm a Lexiscan myoview to r/o any high risk study.    2.  HTN  Adquate control  Meds recently backed down as BP 110 (though patient asymptomatic)

## 2014-03-23 NOTE — Patient Instructions (Signed)
Your physician recommends that you continue on your current medications as directed. Please refer to the Current Medication list given to you today. Your physician has requested that you have a lexiscan myoview. For further information please visit [email or web address]. Please follow instruction sheet, as given.  

## 2014-03-24 ENCOUNTER — Ambulatory Visit (HOSPITAL_COMMUNITY)
Admission: RE | Admit: 2014-03-24 | Discharge: 2014-03-24 | Disposition: A | Discharge status: Home / Self Care | Payer: Medicare Other | Source: Ambulatory Visit | Attending: Cardiology | Admitting: Cardiology

## 2014-03-24 ENCOUNTER — Telehealth: Payer: Self-pay | Admitting: Internal Medicine

## 2014-03-24 ENCOUNTER — Encounter: Payer: Self-pay | Admitting: Cardiology

## 2014-03-24 DIAGNOSIS — I5189 Other ill-defined heart diseases: Secondary | ICD-10-CM | POA: Insufficient documentation

## 2014-03-24 DIAGNOSIS — Z01818 Encounter for other preprocedural examination: Secondary | ICD-10-CM

## 2014-03-24 DIAGNOSIS — R0609 Other forms of dyspnea: Secondary | ICD-10-CM | POA: Insufficient documentation

## 2014-03-24 DIAGNOSIS — Z681 Body mass index (BMI) 19 or less, adult: Secondary | ICD-10-CM | POA: Diagnosis not present

## 2014-03-24 DIAGNOSIS — I10 Essential (primary) hypertension: Secondary | ICD-10-CM | POA: Insufficient documentation

## 2014-03-24 DIAGNOSIS — Z01810 Encounter for preprocedural cardiovascular examination: Secondary | ICD-10-CM

## 2014-03-24 DIAGNOSIS — R002 Palpitations: Secondary | ICD-10-CM | POA: Diagnosis not present

## 2014-03-24 DIAGNOSIS — Z8249 Family history of ischemic heart disease and other diseases of the circulatory system: Secondary | ICD-10-CM | POA: Insufficient documentation

## 2014-03-24 MED ORDER — TECHNETIUM TC 99M SESTAMIBI GENERIC - CARDIOLITE
10.8000 | Freq: Once | INTRAVENOUS | Status: AC | PRN
  Administered 2014-03-24: 11 via INTRAVENOUS

## 2014-03-24 MED ORDER — REGADENOSON 0.4 MG/5ML IV SOLN
0.4000 mg | Freq: Once | INTRAVENOUS | Status: AC
  Administered 2014-03-24: 0.4 mg via INTRAVENOUS

## 2014-03-24 MED ORDER — TECHNETIUM TC 99M SESTAMIBI GENERIC - CARDIOLITE
31.0000 | Freq: Once | INTRAVENOUS | Status: AC | PRN
  Administered 2014-03-24: 31 via INTRAVENOUS

## 2014-03-24 NOTE — Telephone Encounter (Signed)
Spoke with [employer]. Fax number is [phone]. Will fax clearance letter and last office note to her.

## 2014-03-24 NOTE — Procedures (Addendum)
[facility] [address] [place] [address] [phone]  Cardiology Nuclear Med Study  Crystal Petersen is a 80 y.o. female     MRN : [id number]     DOB: 03-Nov-1921  Procedure Date: 03/24/2014  Nuclear Med Background Indication for Stress Test:  Surgical Clearance History:  Diastolic dysfunction;No prior respiratory history reported. Cardiac Risk Factors: Family History - CAD and Hypertension  Symptoms:  DOE and Palpitations   Nuclear Pre-Procedure Caffeine/Decaff Intake:  7:00pm NPO After: 5:00am   IV Site: R Forearm  IV 0.9% NS with Angio Cath:  22g  Chest Size (in):  n/a IV Started by: Azucena Cecil, RN  Height: 5\' 3"  (1.6 m)  Cup Size: A  BMI:  Body mass index is 19.84 kg/(m^2). Weight:  112 lb (50.803 kg)   Tech Comments:  n/a    Nuclear Med Study 1 or 2 day study: 1 day  Stress Test Type:  [place] Provider:  Dorna Leitz, MD   Resting Radionuclide: Technetium 76m Sestamibi  Resting Radionuclide Dose: 10.8 mCi   Stress Radionuclide:  Technetium 41m Sestamibi  Stress Radionuclide Dose: 31.0 mCi           Stress Protocol Rest HR: 81 Stress HR: 95  Rest BP: 154/86 Stress BP: 127/73  Exercise Time (min): n/a METS: n/a   Predicted Max HR: 130 bpm % Max HR: 77.69 bpm Rate Pressure Product: 15756  Dose of Adenosine (mg):  n/a Dose of Lexiscan: 0.4 mg  Dose of Atropine (mg): n/a Dose of Dobutamine: n/a mcg/kg/min (at max HR)  Stress Test Technologist: Leane Para, CCT Nuclear Technologist: Imagene Riches, CNMT   Rest Procedure:  Myocardial perfusion imaging was performed at rest 45 minutes following the intravenous administration of Technetium 24m Sestamibi. Stress Procedure:  The patient received IV Lexiscan 0.4 mg over 15-seconds.  Technetium 21m Sestamibi injected IV at 30-seconds.  There were no significant changes with Lexiscan.  Quantitative spect images were obtained after a 45  minute delay.  Transient Ischemic Dilatation (Normal <1.22):  0.98  QGS EDV:  58 ml QGS ESV:  17 ml LV Ejection Fraction: 70%       Rest ECG: NSR with non-specific ST-T wave changes  Stress ECG: No significant ST segment change suggestive of ischemia.  QPS Raw Data Images:  Acquisition technically good; normal left ventricular size. Stress Images:  Normal homogeneous uptake in all areas of the myocardium. Rest Images:  Normal homogeneous uptake in all areas of the myocardium. Subtraction (SDS):  No evidence of ischemia.  Impression Exercise Capacity:  Lexiscan with no exercise. BP Response:  Normal blood pressure response. Clinical Symptoms:  There is dyspnea. ECG Impression:  No significant ST segment change suggestive of ischemia. Comparison with Prior Nuclear Study: No previous nuclear study performed  Overall Impression:  Normal stress nuclear study.  LV Wall Motion:  NL LV Function; NL Wall Motion   Kirk Ruths, MD  03/24/2014 3:30 PM

## 2014-03-24 NOTE — Telephone Encounter (Signed)
I have tried twice to fax paperwork to number given. Fax was busy.  I have left message with this information on Carla's voicemail.

## 2014-03-24 NOTE — Telephone Encounter (Signed)
Left message to call back and to let us know fax number. Letter has been written by Kerin Ransom, PA clearing pt for surgery.

## 2014-03-24 NOTE — Telephone Encounter (Signed)
New message       Pt having rt total hip surgery.  Had nuclear this am.  Is she cleared for surgery. Surgery is Monday.  Please call.

## 2014-03-26 MED ORDER — CEFAZOLIN SODIUM-DEXTROSE 2-3 GM-% IV SOLR
2.0000 g | INTRAVENOUS | Status: AC
  Administered 2014-03-27: 2 g via INTRAVENOUS
  Filled 2014-03-26: qty 50

## 2014-03-26 NOTE — Anesthesia Preprocedure Evaluation (Addendum)
Anesthesia Evaluation  Patient identified by MRN, date of birth, ID band Patient awake    Reviewed: Allergy & Precautions, H&P , NPO status , Patient's Chart, lab work & pertinent test results, reviewed documented beta blocker date and time   Airway Mallampati: II  TM Distance: >3 FB Neck ROM: Full    Dental  (+) Teeth Intact   Pulmonary  breath sounds clear to auscultation        Cardiovascular hypertension, Pt. on medications and Pt. on home beta blockers Rhythm:Regular  STRESS 03/24/2014 Normal with normal LVF   Neuro/Psych Daughter took pt's bilateral hearing aides; pt is very hard of hearing.    GI/Hepatic GERD-  Controlled and Medicated,  Endo/Other  diabetes  Renal/GU      Musculoskeletal   Abdominal (+)  Abdomen: soft.    Peds  Hematology   Anesthesia Other Findings   Reproductive/Obstetrics                          Anesthesia Physical Anesthesia Plan  ASA: III  Anesthesia Plan: Spinal   Post-op Pain Management:    Induction:   Airway Management Planned: Nasal Cannula  Additional Equipment:   Intra-op Plan:   Post-operative Plan: Extubation in OR  Informed Consent: I have reviewed the patients History and Physical, chart, labs and discussed the procedure including the risks, benefits and alternatives for the proposed anesthesia with the patient or authorized representative who has indicated his/her understanding and acceptance.     Plan Discussed with:   Anesthesia Plan Comments:        Anesthesia Quick Evaluation

## 2014-03-27 ENCOUNTER — Encounter (HOSPITAL_COMMUNITY)
Admission: RE | Disposition: A | Discharge status: Skilled Nursing Facility | Payer: Self-pay | Source: Ambulatory Visit | Attending: Orthopedic Surgery

## 2014-03-27 ENCOUNTER — Inpatient Hospital Stay (HOSPITAL_COMMUNITY): Payer: Medicare Other

## 2014-03-27 ENCOUNTER — Encounter (HOSPITAL_COMMUNITY): Payer: Self-pay | Admitting: *Deleted

## 2014-03-27 ENCOUNTER — Inpatient Hospital Stay (HOSPITAL_COMMUNITY): Payer: Medicare Other | Admitting: Anesthesiology

## 2014-03-27 ENCOUNTER — Inpatient Hospital Stay (HOSPITAL_COMMUNITY): Payer: Medicare Other | Admitting: Vascular Surgery

## 2014-03-27 ENCOUNTER — Inpatient Hospital Stay (HOSPITAL_COMMUNITY)
Admission: RE | Admit: 2014-03-27 | Discharge: 2014-03-30 | DRG: 470 | Disposition: A | Discharge status: Skilled Nursing Facility | Payer: Medicare Other | Source: Ambulatory Visit | Attending: Orthopedic Surgery | Admitting: Orthopedic Surgery

## 2014-03-27 DIAGNOSIS — M1611 Unilateral primary osteoarthritis, right hip: Secondary | ICD-10-CM

## 2014-03-27 DIAGNOSIS — E785 Hyperlipidemia, unspecified: Secondary | ICD-10-CM | POA: Diagnosis present

## 2014-03-27 DIAGNOSIS — Z882 Allergy status to sulfonamides status: Secondary | ICD-10-CM

## 2014-03-27 DIAGNOSIS — Z86010 Personal history of colonic polyps: Secondary | ICD-10-CM | POA: Diagnosis not present

## 2014-03-27 DIAGNOSIS — G301 Alzheimer's disease with late onset: Secondary | ICD-10-CM | POA: Diagnosis present

## 2014-03-27 DIAGNOSIS — I10 Essential (primary) hypertension: Secondary | ICD-10-CM | POA: Diagnosis present

## 2014-03-27 DIAGNOSIS — D62 Acute posthemorrhagic anemia: Secondary | ICD-10-CM | POA: Diagnosis not present

## 2014-03-27 DIAGNOSIS — Z881 Allergy status to other antibiotic agents status: Secondary | ICD-10-CM

## 2014-03-27 DIAGNOSIS — K219 Gastro-esophageal reflux disease without esophagitis: Secondary | ICD-10-CM | POA: Diagnosis present

## 2014-03-27 DIAGNOSIS — Z888 Allergy status to other drugs, medicaments and biological substances status: Secondary | ICD-10-CM | POA: Diagnosis not present

## 2014-03-27 DIAGNOSIS — Z419 Encounter for procedure for purposes other than remedying health state, unspecified: Secondary | ICD-10-CM

## 2014-03-27 DIAGNOSIS — F0280 Dementia in other diseases classified elsewhere without behavioral disturbance: Secondary | ICD-10-CM | POA: Diagnosis present

## 2014-03-27 DIAGNOSIS — E039 Hypothyroidism, unspecified: Secondary | ICD-10-CM | POA: Diagnosis present

## 2014-03-27 DIAGNOSIS — Z85820 Personal history of malignant melanoma of skin: Secondary | ICD-10-CM | POA: Diagnosis not present

## 2014-03-27 DIAGNOSIS — Z9841 Cataract extraction status, right eye: Secondary | ICD-10-CM

## 2014-03-27 DIAGNOSIS — Z9049 Acquired absence of other specified parts of digestive tract: Secondary | ICD-10-CM | POA: Diagnosis present

## 2014-03-27 DIAGNOSIS — K5730 Diverticulosis of large intestine without perforation or abscess without bleeding: Secondary | ICD-10-CM | POA: Diagnosis present

## 2014-03-27 DIAGNOSIS — Z9842 Cataract extraction status, left eye: Secondary | ICD-10-CM | POA: Diagnosis not present

## 2014-03-27 DIAGNOSIS — M25551 Pain in right hip: Secondary | ICD-10-CM | POA: Diagnosis present

## 2014-03-27 DIAGNOSIS — E876 Hypokalemia: Secondary | ICD-10-CM | POA: Diagnosis not present

## 2014-03-27 HISTORY — PX: TOTAL HIP ARTHROPLASTY: SHX124

## 2014-03-27 HISTORY — DX: Unilateral primary osteoarthritis, right hip: M16.11

## 2014-03-27 SURGERY — ARTHROPLASTY, HIP, TOTAL, ANTERIOR APPROACH
Anesthesia: Spinal | Laterality: Right

## 2014-03-27 MED ORDER — ASPIRIN EC 325 MG PO TBEC
325.0000 mg | DELAYED_RELEASE_TABLET | Freq: Two times a day (BID) | ORAL | Status: DC
Start: 2014-03-28 — End: 2014-03-30
  Administered 2014-03-28 – 2014-03-30 (×5): 325 mg via ORAL
  Filled 2014-03-27 (×8): qty 1

## 2014-03-27 MED ORDER — MIDAZOLAM HCL 2 MG/2ML IJ SOLN
INTRAMUSCULAR | Status: AC
  Filled 2014-03-27: qty 2

## 2014-03-27 MED ORDER — METHOCARBAMOL 500 MG PO TABS: 500.0000 mg | ORAL_TABLET | Freq: Three times a day (TID) | ORAL | Status: DC | PRN

## 2014-03-27 MED ORDER — HYDROMORPHONE HCL 1 MG/ML IJ SOLN: 0.5000 mg | INTRAMUSCULAR | Status: DC | PRN

## 2014-03-27 MED ORDER — OXYCODONE-ACETAMINOPHEN 5-325 MG PO TABS: 1.0000 | ORAL_TABLET | Freq: Four times a day (QID) | ORAL | Status: DC | PRN

## 2014-03-27 MED ORDER — BUPIVACAINE HCL 0.5 % IJ SOLN
INTRAMUSCULAR | Status: DC | PRN
  Administered 2014-03-27: 31 mL

## 2014-03-27 MED ORDER — ONDANSETRON HCL 4 MG/2ML IJ SOLN: 4.0000 mg | Freq: Four times a day (QID) | INTRAMUSCULAR | Status: DC | PRN

## 2014-03-27 MED ORDER — ONDANSETRON HCL 4 MG/2ML IJ SOLN
INTRAMUSCULAR | Status: AC
  Filled 2014-03-27: qty 2

## 2014-03-27 MED ORDER — DOCUSATE SODIUM 100 MG PO CAPS
100.0000 mg | ORAL_CAPSULE | Freq: Two times a day (BID) | ORAL | Status: DC
  Administered 2014-03-27 – 2014-03-30 (×6): 100 mg via ORAL
  Filled 2014-03-27 (×6): qty 1

## 2014-03-27 MED ORDER — ONDANSETRON HCL 4 MG PO TABS: 4.0000 mg | ORAL_TABLET | Freq: Four times a day (QID) | ORAL | Status: DC | PRN

## 2014-03-27 MED ORDER — METHOCARBAMOL 1000 MG/10ML IJ SOLN
500.0000 mg | Freq: Four times a day (QID) | INTRAVENOUS | Status: DC | PRN
  Filled 2014-03-27: qty 5

## 2014-03-27 MED ORDER — ASPIRIN EC 325 MG PO TBEC: 325.0000 mg | DELAYED_RELEASE_TABLET | Freq: Two times a day (BID) | ORAL | Status: DC

## 2014-03-27 MED ORDER — ONDANSETRON HCL 4 MG/2ML IJ SOLN
INTRAMUSCULAR | Status: DC | PRN
  Administered 2014-03-27: 4 mg via INTRAVENOUS

## 2014-03-27 MED ORDER — 0.9 % SODIUM CHLORIDE (POUR BTL) OPTIME
TOPICAL | Status: DC | PRN
  Administered 2014-03-27: 1000 mL

## 2014-03-27 MED ORDER — BUPIVACAINE LIPOSOME 1.3 % IJ SUSP
INTRAMUSCULAR | Status: DC | PRN
  Administered 2014-03-27: 31 mL

## 2014-03-27 MED ORDER — ALBUMIN HUMAN 5 % IV SOLN
INTRAVENOUS | Status: DC | PRN
  Administered 2014-03-27: 13:00:00 via INTRAVENOUS

## 2014-03-27 MED ORDER — LACTATED RINGERS IV SOLN
INTRAVENOUS | Status: DC | PRN
  Administered 2014-03-27 (×2): via INTRAVENOUS

## 2014-03-27 MED ORDER — MEPERIDINE HCL 25 MG/ML IJ SOLN
6.2500 mg | INTRAMUSCULAR | Status: DC | PRN
Start: 2014-03-27 — End: 2014-03-27

## 2014-03-27 MED ORDER — DIPHENHYDRAMINE HCL 12.5 MG/5ML PO ELIX: 12.5000 mg | ORAL_SOLUTION | ORAL | Status: DC | PRN

## 2014-03-27 MED ORDER — LABETALOL HCL 200 MG PO TABS
200.0000 mg | ORAL_TABLET | Freq: Two times a day (BID) | ORAL | Status: DC
  Administered 2014-03-27 – 2014-03-30 (×6): 200 mg via ORAL
  Filled 2014-03-27 (×7): qty 1

## 2014-03-27 MED ORDER — ACETAMINOPHEN 325 MG PO TABS
650.0000 mg | ORAL_TABLET | Freq: Four times a day (QID) | ORAL | Status: DC | PRN
  Administered 2014-03-27 – 2014-03-30 (×2): 650 mg via ORAL
  Filled 2014-03-27 (×2): qty 2

## 2014-03-27 MED ORDER — ALUM & MAG HYDROXIDE-SIMETH 200-200-20 MG/5ML PO SUSP: 30.0000 mL | ORAL | Status: DC | PRN

## 2014-03-27 MED ORDER — BUPIVACAINE LIPOSOME 1.3 % IJ SUSP
20.0000 mL | Freq: Once | INTRAMUSCULAR | Status: DC
  Filled 2014-03-27: qty 20

## 2014-03-27 MED ORDER — MUPIROCIN 2 % EX OINT
TOPICAL_OINTMENT | Freq: Two times a day (BID) | CUTANEOUS | Status: DC
  Administered 2014-03-27 – 2014-03-29 (×4): via TOPICAL
  Administered 2014-03-29: 1 via TOPICAL
  Administered 2014-03-30: 12:00:00 via TOPICAL
  Filled 2014-03-27: qty 22

## 2014-03-27 MED ORDER — TRANEXAMIC ACID 100 MG/ML IV SOLN
2000.0000 mg | Freq: Once | INTRAVENOUS | Status: AC
  Administered 2014-03-27: 2000 mg via TOPICAL
  Filled 2014-03-27: qty 20

## 2014-03-27 MED ORDER — METHOCARBAMOL 500 MG PO TABS
500.0000 mg | ORAL_TABLET | Freq: Four times a day (QID) | ORAL | Status: DC | PRN
Start: 2014-03-27 — End: 2014-03-30
  Administered 2014-03-27: 500 mg via ORAL
  Filled 2014-03-27: qty 1

## 2014-03-27 MED ORDER — SODIUM CHLORIDE 0.9 % IV SOLN
INTRAVENOUS | Status: DC
  Administered 2014-03-27 – 2014-03-28 (×2): via INTRAVENOUS

## 2014-03-27 MED ORDER — PANTOPRAZOLE SODIUM 40 MG PO TBEC
80.0000 mg | DELAYED_RELEASE_TABLET | Freq: Every day | ORAL | Status: DC
  Administered 2014-03-27 – 2014-03-30 (×4): 80 mg via ORAL
  Filled 2014-03-27 (×4): qty 2

## 2014-03-27 MED ORDER — ACETAMINOPHEN 650 MG RE SUPP: 650.0000 mg | Freq: Four times a day (QID) | RECTAL | Status: DC | PRN

## 2014-03-27 MED ORDER — PROPOFOL INFUSION 10 MG/ML OPTIME
INTRAVENOUS | Status: DC | PRN
  Administered 2014-03-27: 25 ug/kg/min via INTRAVENOUS

## 2014-03-27 MED ORDER — PROPOFOL 10 MG/ML IV BOLUS
INTRAVENOUS | Status: AC
  Filled 2014-03-27: qty 20

## 2014-03-27 MED ORDER — FENTANYL CITRATE 0.05 MG/ML IJ SOLN
INTRAMUSCULAR | Status: DC | PRN
  Administered 2014-03-27: 12.5 ug via INTRAVENOUS
  Administered 2014-03-27: 25 ug via INTRAVENOUS

## 2014-03-27 MED ORDER — PROMETHAZINE HCL 25 MG/ML IJ SOLN: 6.2500 mg | INTRAMUSCULAR | Status: DC | PRN

## 2014-03-27 MED ORDER — OXYCODONE-ACETAMINOPHEN 5-325 MG PO TABS
1.0000 | ORAL_TABLET | ORAL | Status: DC | PRN
  Administered 2014-03-28 – 2014-03-30 (×8): 1 via ORAL
  Filled 2014-03-27 (×8): qty 1

## 2014-03-27 MED ORDER — FENTANYL CITRATE 0.05 MG/ML IJ SOLN
INTRAMUSCULAR | Status: AC
  Filled 2014-03-27: qty 2

## 2014-03-27 MED ORDER — MIDAZOLAM HCL 5 MG/5ML IJ SOLN
INTRAMUSCULAR | Status: DC | PRN
  Administered 2014-03-27: 0.5 mg via INTRAVENOUS
  Administered 2014-03-27: .25 mg via INTRAVENOUS

## 2014-03-27 MED ORDER — POLYETHYLENE GLYCOL 3350 17 G PO PACK: 17.0000 g | PACK | Freq: Every day | ORAL | Status: DC | PRN

## 2014-03-27 MED ORDER — KETOROLAC TROMETHAMINE 15 MG/ML IJ SOLN
7.5000 mg | Freq: Four times a day (QID) | INTRAMUSCULAR | Status: AC
  Administered 2014-03-28 (×3): 7.5 mg via INTRAVENOUS
  Filled 2014-03-27: qty 1

## 2014-03-27 MED ORDER — LACTATED RINGERS IV SOLN
INTRAVENOUS | Status: DC
  Administered 2014-03-27: 11:00:00 via INTRAVENOUS

## 2014-03-27 MED ORDER — POLYETHYLENE GLYCOL 3350 17 G PO PACK: 17.0000 g | PACK | ORAL | Status: DC | PRN

## 2014-03-27 MED ORDER — FENTANYL CITRATE 0.05 MG/ML IJ SOLN
25.0000 ug | INTRAMUSCULAR | Status: DC | PRN
  Administered 2014-03-27 (×4): 25 ug via INTRAVENOUS

## 2014-03-27 MED ORDER — FENTANYL CITRATE 0.05 MG/ML IJ SOLN
INTRAMUSCULAR | Status: AC
  Filled 2014-03-27: qty 5

## 2014-03-27 MED ORDER — CEFAZOLIN SODIUM-DEXTROSE 2-3 GM-% IV SOLR
2.0000 g | Freq: Four times a day (QID) | INTRAVENOUS | Status: AC
  Administered 2014-03-27 – 2014-03-28 (×2): 2 g via INTRAVENOUS
  Filled 2014-03-27 (×5): qty 50

## 2014-03-27 MED ORDER — LEVOTHYROXINE SODIUM 100 MCG PO TABS
100.0000 ug | ORAL_TABLET | Freq: Every day | ORAL | Status: DC
  Administered 2014-03-28 – 2014-03-30 (×3): 100 ug via ORAL
  Filled 2014-03-27 (×4): qty 1

## 2014-03-27 MED ORDER — DEXTROSE 5 % IV SOLN
INTRAVENOUS | Status: DC | PRN
  Administered 2014-03-27: 13:00:00 via INTRAVENOUS

## 2014-03-27 MED ORDER — PHENYLEPHRINE HCL 10 MG/ML IJ SOLN
10.0000 mg | INTRAVENOUS | Status: DC | PRN
  Administered 2014-03-27: 40 ug/min via INTRAVENOUS

## 2014-03-27 MED ORDER — BISACODYL 5 MG PO TBEC
5.0000 mg | DELAYED_RELEASE_TABLET | Freq: Every day | ORAL | Status: DC | PRN
  Administered 2014-03-29: 5 mg via ORAL
  Filled 2014-03-27: qty 1

## 2014-03-27 MED ORDER — PROPOFOL 10 MG/ML IV BOLUS
INTRAVENOUS | Status: DC | PRN
  Administered 2014-03-27 (×2): 20 mg via INTRAVENOUS

## 2014-03-27 MED ORDER — EZETIMIBE 10 MG PO TABS
10.0000 mg | ORAL_TABLET | Freq: Every day | ORAL | Status: DC
  Administered 2014-03-28 – 2014-03-30 (×3): 10 mg via ORAL
  Filled 2014-03-27 (×3): qty 1

## 2014-03-27 SURGICAL SUPPLY — 57 items
BENZOIN TINCTURE PRP APPL 2/3 (GAUZE/BANDAGES/DRESSINGS) ×3 IMPLANT
BLADE SAW SGTL 18X1.27X75 (BLADE) ×2 IMPLANT
BLADE SAW SGTL 18X1.27X75MM (BLADE) ×1
BLADE SURG ROTATE 9660 (MISCELLANEOUS) IMPLANT
BNDG COHESIVE 6X5 TAN STRL LF (GAUZE/BANDAGES/DRESSINGS) IMPLANT
BNDG GAUZE ELAST 4 BULKY (GAUZE/BANDAGES/DRESSINGS) IMPLANT
CAPT HIP TOTAL 2 ×3 IMPLANT
CELLS DAT CNTRL 66122 CELL SVR (MISCELLANEOUS) IMPLANT
CLOSURE STERI-STRIP 1/2X4 (GAUZE/BANDAGES/DRESSINGS) ×1
CLSR STERI-STRIP ANTIMIC 1/2X4 (GAUZE/BANDAGES/DRESSINGS) ×2 IMPLANT
COVER SURGICAL LIGHT HANDLE (MISCELLANEOUS) ×3 IMPLANT
DRAPE C-ARM 42X72 X-RAY (DRAPES) ×3 IMPLANT
DRAPE IMP U-DRAPE 54X76 (DRAPES) ×3 IMPLANT
DRAPE STERI IOBAN 125X83 (DRAPES) ×3 IMPLANT
DRAPE U-SHAPE 47X51 STRL (DRAPES) ×9 IMPLANT
DRSG MEPILEX BORDER 4X8 (GAUZE/BANDAGES/DRESSINGS) ×3 IMPLANT
DURAPREP 26ML APPLICATOR (WOUND CARE) ×3 IMPLANT
ELECT BLADE 4.0 EZ CLEAN MEGAD (MISCELLANEOUS)
ELECT CAUTERY BLADE 6.4 (BLADE) ×3 IMPLANT
ELECT REM PT RETURN 9FT ADLT (ELECTROSURGICAL) ×3
ELECTRODE BLDE 4.0 EZ CLN MEGD (MISCELLANEOUS) IMPLANT
ELECTRODE REM PT RTRN 9FT ADLT (ELECTROSURGICAL) ×1 IMPLANT
GAUZE XEROFORM 1X8 LF (GAUZE/BANDAGES/DRESSINGS) ×3 IMPLANT
GLOVE BIOGEL PI IND STRL 8 (GLOVE) ×2 IMPLANT
GLOVE BIOGEL PI INDICATOR 8 (GLOVE) ×4
GLOVE ECLIPSE 7.5 STRL STRAW (GLOVE) ×6 IMPLANT
GOWN STRL REUS W/ TWL LRG LVL3 (GOWN DISPOSABLE) ×2 IMPLANT
GOWN STRL REUS W/ TWL XL LVL3 (GOWN DISPOSABLE) ×2 IMPLANT
GOWN STRL REUS W/TWL LRG LVL3 (GOWN DISPOSABLE) ×4
GOWN STRL REUS W/TWL XL LVL3 (GOWN DISPOSABLE) ×4
HOOD PEEL AWAY FACE SHEILD DIS (HOOD) ×6 IMPLANT
KIT BASIN OR (CUSTOM PROCEDURE TRAY) ×3 IMPLANT
KIT ROOM TURNOVER OR (KITS) ×3 IMPLANT
MANIFOLD NEPTUNE II (INSTRUMENTS) ×3 IMPLANT
NEEDLE SPNL 22GX3.5 QUINCKE BK (NEEDLE) ×3 IMPLANT
NS IRRIG 1000ML POUR BTL (IV SOLUTION) ×3 IMPLANT
PACK TOTAL JOINT (CUSTOM PROCEDURE TRAY) ×3 IMPLANT
PACK UNIVERSAL I (CUSTOM PROCEDURE TRAY) ×3 IMPLANT
PAD ARMBOARD 7.5X6 YLW CONV (MISCELLANEOUS) ×6 IMPLANT
RTRCTR WOUND ALEXIS 18CM MED (MISCELLANEOUS)
RTRCTR WOUND ALEXIS 18CM SML (INSTRUMENTS) ×3
SAVER CELL AAL HAEMONETICS (INSTRUMENTS) ×1 IMPLANT
SPONGE LAP 18X18 X RAY DECT (DISPOSABLE) IMPLANT
STAPLER VISISTAT 35W (STAPLE) IMPLANT
SUT ETHIBOND NAB CT1 #1 30IN (SUTURE) ×6 IMPLANT
SUT MNCRL AB 3-0 PS2 18 (SUTURE) IMPLANT
SUT VIC AB 0 CT1 27 (SUTURE) ×2
SUT VIC AB 0 CT1 27XBRD ANBCTR (SUTURE) ×1 IMPLANT
SUT VIC AB 1 CT1 27 (SUTURE) ×4
SUT VIC AB 1 CT1 27XBRD ANBCTR (SUTURE) ×2 IMPLANT
SUT VIC AB 2-0 CT1 27 (SUTURE) ×2
SUT VIC AB 2-0 CT1 TAPERPNT 27 (SUTURE) ×1 IMPLANT
SYR 50ML LL SCALE MARK (SYRINGE) ×3 IMPLANT
TOWEL OR 17X24 6PK STRL BLUE (TOWEL DISPOSABLE) ×3 IMPLANT
TOWEL OR 17X26 10 PK STRL BLUE (TOWEL DISPOSABLE) ×3 IMPLANT
TRAY FOLEY CATH 16FR SILVER (SET/KITS/TRAYS/PACK) IMPLANT
WATER STERILE IRR 1000ML POUR (IV SOLUTION) ×6 IMPLANT

## 2014-03-27 NOTE — Transfer of Care (Signed)
Immediate Anesthesia Transfer of Care Note  Patient: Crystal Petersen  Procedure(s) Performed: Procedure(s): TOTAL HIP ARTHROPLASTY ANTERIOR APPROACH (Right)  Patient Location: PACU  Anesthesia Type:Spinal  Level of Consciousness: awake, alert , oriented and patient cooperative; sensory to ankle on the right  Airway & Oxygen Therapy: Patient Spontanous Breathing and Patient connected to nasal cannula oxygen  Post-op Assessment: Report given to PACU RN, Post -op Vital signs reviewed and stable and Patient moving all extremities  Post vital signs: Reviewed and stable  Complications: No apparent anesthesia complications

## 2014-03-27 NOTE — H&P (Signed)
TOTAL HIP ADMISSION H&P  Patient is admitted for right total hip arthroplasty.  Subjective:  Chief Complaint: right hip pain  HPI: Crystal Petersen, 80 y.o. female, has a history of pain and functional disability in the right hip(s) due to arthritis and patient has failed non-surgical conservative treatments for greater than 12 weeks to include NSAID's and/or analgesics, use of assistive devices, weight reduction as appropriate and activity modification.  Onset of symptoms was gradual starting 3 years ago with rapidlly worsening course since that time.The patient noted no past surgery on the right hip(s).  Patient currently rates pain in the right hip at 9 out of 10 with activity. Patient has night pain, worsening of pain with activity and weight bearing, trendelenberg gait, pain that interfers with activities of daily living, pain with passive range of motion, crepitus and joint swelling. Patient has evidence of subchondral sclerosis, periarticular osteophytes and joint space narrowing by imaging studies. This condition presents safety issues increasing the risk of falls. This patient has had failure of all conservative care.  There is no current active infection.  Patient Active Problem List   Diagnosis Date Noted  . Right hip pain 03/15/2014  . Prediabetes 11/17/2013  . Medication management 11/17/2013  . Vitamin D deficiency 11/17/2013  . Hyperlipidemia 07/05/2013  . Hypothyroidism 07/05/2013  . Essential hypertension 07/05/2013  . SDAT (senile dementia of Alzheimer's type) 07/05/2013  . Anal polyp 08/16/2012  . Internal hemorrhoids without mention of complication 09/38/1829  . Diastolic dysfunction 93/71/6967  . Stricture and stenosis of esophagus 10/27/2007  . GERD 10/25/2007  . ADENOMATOUS COLONIC POLYP 06/28/2004  . Diverticulosis of large intestine 06/28/2004   Past Medical History  Diagnosis Date  . Hypertension   . Nonspecific abnormal electrocardiogram (ECG) (EKG)   .  Stricture and stenosis of esophagus   . Esophageal reflux   . Diverticulosis of colon (without mention of hemorrhage)   . Benign neoplasm of colon   . Macular degeneration   . Osteoarthritis     osteroperosis  . Other and unspecified hyperlipidemia   . HTN (hypertension)   . Shingles   . Cystocele   . Gastroparesis   . Skin cancer (melanoma)   . Hypothyroidism   . Cancer     pre-melanoma  . Hemorrhoids     Past Surgical History  Procedure Laterality Date  . Cholecystectomy    . Gallbladder surgery  2002  . Hemorrhoid surgery    . Appendectomy    . Thyroidectomy    . Left eye surgery    . Cataract extraction, bilateral    . Melenoma resection      right arm  . Bcc resection      legs/face  . Tonsillectomy    . Flexible sigmoidoscopy  05/12/2011    Procedure: FLEXIBLE SIGMOIDOSCOPY;  Surgeon: Inda Castle, MD;  Location: [facility];  Service: Endoscopy;  Laterality: N/A;  . Flexible sigmoidoscopy  09/10/2011    Procedure: FLEXIBLE SIGMOIDOSCOPY;  Surgeon: Inda Castle, MD;  Location: [facility];  Service: Endoscopy;  Laterality: N/A;  . Fracture surgery      left hip pin  . Flexible sigmoidoscopy N/A 08/16/2012    Procedure: FLEXIBLE SIGMOIDOSCOPY;  Surgeon: Inda Castle, MD;  Location: [facility];  Service: Endoscopy;  Laterality: N/A;  . Hemorrhoid banding N/A 08/16/2012    Procedure: HEMORRHOID BANDING;  Surgeon: Inda Castle, MD;  Location: [facility];  Service: Endoscopy;  Laterality: N/A;    Prescriptions prior  to admission  Medication Sig Dispense Refill Last Dose  . calcium carbonate (CAL-GEST ANTACID) 500 MG chewable tablet Chew 1 tablet by mouth as needed.    03/26/2014 at Unknown time  . Cholecalciferol (VITAMIN D) 2000 UNITS tablet Take 4,000 Units by mouth daily.   03/26/2014 at Unknown time  . docusate sodium (COLACE) 100 MG capsule Take 100 mg by mouth 2 (two) times daily as needed.    03/26/2014 at Unknown time  . labetalol (NORMODYNE) 100  MG tablet TAKE 1 TABLET BY MOUTH TWICE A DAY FOR BP (Patient taking differently: Take 200 mg by mouth twice a day.) 180 tablet 1 03/27/2014 at 0800  . levothyroxine (SYNTHROID) 100 MCG tablet Take 1 tablet (100 mcg total) by mouth daily. 30 tablet 11 03/26/2014  . mupirocin ointment (BACTROBAN) 2 % daily.  0 Past Week at Unknown time  . omeprazole (PRILOSEC) 40 MG capsule TAKE ONE CAPSULE BY MOUTH EVERY DAY FOR ACID REFLUX (Patient taking differently: Take 40 mg by mouth daily) 90 capsule 1 03/26/2014 at Unknown time  . polyethylene glycol (MIRALAX / GLYCOLAX) packet Take 17 g by mouth as needed for mild constipation.    Past Week at Unknown time  . traMADol (ULTRAM) 50 MG tablet Take 1 tablet (50 mg total) by mouth every 6 (six) hours as needed. 90 tablet 0 03/27/2014 at 0800  . ZETIA 10 MG tablet Take 10 mg by mouth daily.  10 03/27/2014 at 0800   Allergies  Allergen Reactions  . Ace Inhibitors     Elevated potassium  . Fosamax [Alendronate Sodium]   . Metoclopramide Hcl Other (See Comments)    Pt. Not sure  . Reglan [Metoclopramide]   . Statins Other (See Comments)    myalgia  . Sulfa Antibiotics   . Vesicare [Solifenacin]   . Vicodin [Hydrocodone-Acetaminophen] Other (See Comments)    "felt spaced out"  . Sulfonamide Derivatives Rash    History  Substance Use Topics  . Smoking status: Never Smoker   . Smokeless tobacco: Never Used  . Alcohol Use: No    Family History  Problem Relation Age of Onset  . Esophageal cancer Brother   . Heart disease Father   . Hypertension Father   . Heart attack Father   . Colon cancer Neg Hx   . Malignant hyperthermia Neg Hx   . Diabetes Daughter      ROS ROS: I have reviewed the patient's review of systems thoroughly and there are no positive responses as relates to the HPI.  Objective:  Physical Exam  Vital signs in last 24 hours: Temp:  [98.1 F (36.7 C)] 98.1 F (36.7 C) (12/14 1015) Pulse Rate:  [78] 78 (12/14 1015) Resp:   [16] 16 (12/14 1015) BP: (165)/(71) 165/71 mmHg (12/14 1015) SpO2:  [98 %] 98 % (12/14 1015) Weight:  [112 lb (50.803 kg)] 112 lb (50.803 kg) (12/14 1015) Well-developed well-nourished patient in no acute distress. Alert and oriented x3 HEENT:within normal limits Cardiac: Regular rate and rhythm Pulmonary: Lungs clear to auscultation Abdomen: Soft and nontender.  Normal active bowel sounds  Musculoskeletal: (Right hip: Painful range of motion.  Severely limited internal rotation.  2+ distal pulses. Labs: Recent Results (from the past 2160 hour(s))  TSH     Status: None   Collection Time: 12/27/13  2:28 PM  Result Value Ref Range   TSH 0.760 0.350 - 4.500 uIU/mL  CBC with Differential     Status: Abnormal   Collection Time:  02/22/14 11:00 AM  Result Value Ref Range   WBC 11.9 (H) 4.0 - 10.5 K/uL   RBC 4.46 3.87 - 5.11 MIL/uL   Hemoglobin 12.3 12.0 - 15.0 g/dL   HCT 36.3 36.0 - 46.0 %   MCV 81.4 78.0 - 100.0 fL   MCH 27.6 26.0 - 34.0 pg   MCHC 33.9 30.0 - 36.0 g/dL   RDW 14.9 11.5 - 15.5 %   Platelets 390 150 - 400 K/uL   Neutrophils Relative % 76 43 - 77 %   Neutro Abs 9.0 (H) 1.7 - 7.7 K/uL   Lymphocytes Relative 11 (L) 12 - 46 %   Lymphs Abs 1.3 0.7 - 4.0 K/uL   Monocytes Relative 12 3 - 12 %   Monocytes Absolute 1.4 (H) 0.1 - 1.0 K/uL   Eosinophils Relative 1 0 - 5 %   Eosinophils Absolute 0.1 0.0 - 0.7 K/uL   Basophils Relative 0 0 - 1 %   Basophils Absolute 0.0 0.0 - 0.1 K/uL   Smear Review Criteria for review not met   BASIC METABOLIC PANEL WITH GFR     Status: None   Collection Time: 02/22/14 11:00 AM  Result Value Ref Range   Sodium 139 135 - 145 mEq/L   Potassium 3.7 3.5 - 5.3 mEq/L   Chloride 98 96 - 112 mEq/L   CO2 28 19 - 32 mEq/L   Glucose, Bld 79 70 - 99 mg/dL   BUN 16 6 - 23 mg/dL   Creat 0.80 0.50 - 1.10 mg/dL   Calcium 9.7 8.4 - 10.5 mg/dL   GFR, Est African American 75 mL/min   GFR, Est Non African American 65 mL/min    Comment:   The estimated  GFR is a calculation valid for adults (>=82 years old) that uses the CKD-EPI algorithm to adjust for age and sex. It is   not to be used for children, pregnant women, hospitalized patients,    patients on dialysis, or with rapidly changing kidney function. According to the NKDEP, eGFR >89 is normal, 60-89 shows mild impairment, 30-59 shows moderate impairment, 15-29 shows severe impairment and <15 is ESRD.     Hepatic function panel     Status: None   Collection Time: 02/22/14 11:00 AM  Result Value Ref Range   Total Bilirubin 0.6 0.2 - 1.2 mg/dL   Bilirubin, Direct 0.1 0.0 - 0.3 mg/dL   Indirect Bilirubin 0.5 0.2 - 1.2 mg/dL   Alkaline Phosphatase 70 39 - 117 U/L   AST 18 0 - 37 U/L   ALT 15 0 - 35 U/L   Total Protein 6.2 6.0 - 8.3 g/dL   Albumin 4.0 3.5 - 5.2 g/dL  Lipid panel     Status: Abnormal   Collection Time: 02/22/14 11:00 AM  Result Value Ref Range   Cholesterol 243 (H) 0 - 200 mg/dL    Comment: ATP III Classification:       < 200        mg/dL        Desirable      200 - 239     mg/dL        Borderline High      >= 240        mg/dL        High      Triglycerides 124 <150 mg/dL   HDL 98 >39 mg/dL   Total CHOL/HDL Ratio 2.5 Ratio   VLDL 25 0 - 40  mg/dL   LDL Cholesterol 120 (H) 0 - 99 mg/dL    Comment:   Total Cholesterol/HDL Ratio:CHD Risk                        Coronary Heart Disease Risk Table                                        Men       Women          1/2 Average Risk              3.4        3.3              Average Risk              5.0        4.4           2X Average Risk              9.6        7.1           3X Average Risk             23.4       11.0 Use the calculated Patient Ratio above and the CHD Risk table  to determine the patient's CHD Risk. ATP III Classification (LDL):       < 100        mg/dL         Optimal      100 - 129     mg/dL         Near or Above Optimal      130 - 159     mg/dL         Borderline High      160 - 189     mg/dL          High       > 190        mg/dL         Very High     TSH     Status: None   Collection Time: 02/22/14 11:00 AM  Result Value Ref Range   TSH 0.443 0.350 - 4.500 uIU/mL  Magnesium     Status: None   Collection Time: 02/22/14 11:00 AM  Result Value Ref Range   Magnesium 1.9 1.5 - 2.5 mg/dL  Surgical pcr screen     Status: Abnormal   Collection Time: 03/20/14  1:00 PM  Result Value Ref Range   MRSA, PCR NEGATIVE NEGATIVE   Staphylococcus aureus POSITIVE (A) NEGATIVE    Comment:        The Xpert SA Assay (FDA approved for NASAL specimens in patients over 10 years of age), is one component of a comprehensive surveillance program.  Test performance has been validated by [employer] for patients greater than or equal to 42 year old. It is not intended to diagnose infection nor to guide or monitor treatment.   APTT     Status: None   Collection Time: 03/20/14  1:00 PM  Result Value Ref Range   aPTT 31 24 - 37 seconds  CBC WITH DIFFERENTIAL     Status: Abnormal   Collection Time: 03/20/14  1:00 PM  Result Value Ref Range   WBC 9.6 4.0 -  10.5 K/uL   RBC 4.42 3.87 - 5.11 MIL/uL   Hemoglobin 11.9 (L) 12.0 - 15.0 g/dL   HCT 37.0 36.0 - 46.0 %   MCV 83.7 78.0 - 100.0 fL   MCH 26.9 26.0 - 34.0 pg   MCHC 32.2 30.0 - 36.0 g/dL   RDW 14.1 11.5 - 15.5 %   Platelets 373 150 - 400 K/uL   Neutrophils Relative % 72 43 - 77 %   Neutro Abs 6.9 1.7 - 7.7 K/uL   Lymphocytes Relative 14 12 - 46 %   Lymphs Abs 1.4 0.7 - 4.0 K/uL   Monocytes Relative 13 (H) 3 - 12 %   Monocytes Absolute 1.2 (H) 0.1 - 1.0 K/uL   Eosinophils Relative 1 0 - 5 %   Eosinophils Absolute 0.1 0.0 - 0.7 K/uL   Basophils Relative 0 0 - 1 %   Basophils Absolute 0.0 0.0 - 0.1 K/uL  Comprehensive metabolic panel     Status: Abnormal   Collection Time: 03/20/14  1:00 PM  Result Value Ref Range   Sodium 139 137 - 147 mEq/L   Potassium 3.3 (L) 3.7 - 5.3 mEq/L   Chloride 95 (L) 96 - 112 mEq/L   CO2 27 19 - 32  mEq/L   Glucose, Bld 100 (H) 70 - 99 mg/dL   BUN 19 6 - 23 mg/dL   Creatinine, Ser 0.77 0.50 - 1.10 mg/dL   Calcium 9.5 8.4 - 10.5 mg/dL   Total Protein 6.5 6.0 - 8.3 g/dL   Albumin 3.7 3.5 - 5.2 g/dL   AST 18 0 - 37 U/L   ALT 12 0 - 35 U/L   Alkaline Phosphatase 71 39 - 117 U/L   Total Bilirubin 0.3 0.3 - 1.2 mg/dL   GFR calc non Af Amer 72 (L) >90 mL/min   GFR calc Af Amer 83 (L) >90 mL/min    Comment: (NOTE) The eGFR has been calculated using the CKD EPI equation. This calculation has not been validated in all clinical situations. eGFR's persistently <90 mL/min signify possible Chronic Kidney Disease.    Anion gap 17 (H) 5 - 15  Protime-INR     Status: None   Collection Time: 03/20/14  1:00 PM  Result Value Ref Range   Prothrombin Time 13.9 11.6 - 15.2 seconds   INR 1.05 0.00 - 1.49  Type and screen     Status: None   Collection Time: 03/20/14  1:00 PM  Result Value Ref Range   ABO/RH(D) O POS    Antibody Screen NEG    Sample Expiration 04/03/2014   ABO/Rh     Status: None   Collection Time: 03/20/14  1:00 PM  Result Value Ref Range   ABO/RH(D) O POS   Urinalysis, Routine w reflex microscopic     Status: Abnormal   Collection Time: 03/20/14  1:43 PM  Result Value Ref Range   Color, Urine YELLOW YELLOW   APPearance CLEAR CLEAR   Specific Gravity, Urine 1.025 1.005 - 1.030   pH 6.0 5.0 - 8.0   Glucose, UA NEGATIVE NEGATIVE mg/dL   Hgb urine dipstick SMALL (A) NEGATIVE   Bilirubin Urine SMALL (A) NEGATIVE   Ketones, ur 15 (A) NEGATIVE mg/dL   Protein, ur 30 (A) NEGATIVE mg/dL   Urobilinogen, UA 0.2 0.0 - 1.0 mg/dL   Nitrite NEGATIVE NEGATIVE   Leukocytes, UA TRACE (A) NEGATIVE  Urine microscopic-add on     Status: Abnormal   Collection Time: 03/20/14  1:43 PM  Result Value Ref Range   Squamous Epithelial / LPF FEW (A) RARE   WBC, UA 3-6 <3 WBC/hpf   RBC / HPF 0-2 <3 RBC/hpf   Bacteria, UA FEW (A) RARE   Casts HYALINE CASTS (A) NEGATIVE   Urine-Other MUCOUS  PRESENT     Estimated body mass index is 19.84 kg/(m^2) as calculated from the following:   Height as of this encounter: 5' 3" (1.6 m).   Weight as of this encounter: 112 lb (50.803 kg).   Imaging Review Plain radiographs demonstrate severe degenerative joint disease of the right hip(s). The bone quality appears to be fair for age and reported activity level.  Assessment/Plan:  End stage arthritis, right hip(s)  The patient history, physical examination, clinical judgement of the provider and imaging studies are consistent with end stage degenerative joint disease of the right hip(s) and total hip arthroplasty is deemed medically necessary. The treatment options including medical management, injection therapy, arthroscopy and arthroplasty were discussed at length. The risks and benefits of total hip arthroplasty were presented and reviewed. The risks due to aseptic loosening, infection, stiffness, dislocation/subluxation,  thromboembolic complications and other imponderables were discussed.  The patient acknowledged the explanation, agreed to proceed with the plan and consent was signed. Patient is being admitted for inpatient treatment for surgery, pain control, PT, OT, prophylactic antibiotics, VTE prophylaxis, progressive ambulation and ADL's and discharge planning.The patient is planning to be discharged to skilled nursing facility

## 2014-03-27 NOTE — Anesthesia Postprocedure Evaluation (Signed)
  Anesthesia Post-op Note  Patient: Crystal Petersen  Procedure(s) Performed: Procedure(s): TOTAL HIP ARTHROPLASTY ANTERIOR APPROACH (Right)  Patient Location: PACU  Anesthesia Type:General  Level of Consciousness: awake and alert   Airway and Oxygen Therapy: Patient Spontanous Breathing and Patient connected to nasal cannula oxygen  Post-op Pain: none  Post-op Assessment: Post-op Vital signs reviewed, Patient's Cardiovascular Status Stable, Respiratory Function Stable and Patent Airway  Post-op Vital Signs: Reviewed and stable  Last Vitals:  Filed Vitals:   03/27/14 1500  BP: 131/92  Pulse: 78  Temp:   Resp: 24    Complications: No apparent anesthesia complications

## 2014-03-27 NOTE — Telephone Encounter (Signed)
Letter and last office note faxed this AM.  I received confirmation that it went through to office

## 2014-03-28 ENCOUNTER — Encounter (HOSPITAL_COMMUNITY): Payer: Self-pay | Admitting: Orthopedic Surgery

## 2014-03-28 ENCOUNTER — Encounter (HOSPITAL_COMMUNITY): Payer: Self-pay | Admitting: *Deleted

## 2014-03-28 LAB — BASIC METABOLIC PANEL
ANION GAP: 19 — AB (ref 5–15)
BUN: 14 mg/dL (ref 6–23)
CHLORIDE: 97 meq/L (ref 96–112)
CO2: 22 meq/L (ref 19–32)
CREATININE: 0.56 mg/dL (ref 0.50–1.10)
Calcium: 7.9 mg/dL — ABNORMAL LOW (ref 8.4–10.5)
GFR calc non Af Amer: 80 mL/min — ABNORMAL LOW (ref >90)
Glucose, Bld: 103 mg/dL — ABNORMAL HIGH (ref 70–99)
Potassium: 2.7 mEq/L — CL (ref 3.7–5.3)
Sodium: 138 mEq/L (ref 137–147)

## 2014-03-28 LAB — CBC
HCT: 24.9 % — ABNORMAL LOW (ref 36.0–46.0)
Hemoglobin: 8.1 g/dL — ABNORMAL LOW (ref 12.0–15.0)
MCH: 27.2 pg (ref 26.0–34.0)
MCHC: 32.5 g/dL (ref 30.0–36.0)
MCV: 83.6 fL (ref 78.0–100.0)
PLATELETS: 217 10*3/uL (ref 150–400)
RBC: 2.98 MIL/uL — AB (ref 3.87–5.11)
RDW: 14.1 % (ref 11.5–15.5)
WBC: 7.5 10*3/uL (ref 4.0–10.5)

## 2014-03-28 MED ORDER — KETOROLAC TROMETHAMINE 15 MG/ML IJ SOLN
INTRAMUSCULAR | Status: AC
  Filled 2014-03-28: qty 1

## 2014-03-28 MED ORDER — POTASSIUM CHLORIDE CRYS ER 20 MEQ PO TBCR
20.0000 meq | EXTENDED_RELEASE_TABLET | Freq: Two times a day (BID) | ORAL | Status: DC
  Administered 2014-03-28 – 2014-03-30 (×5): 20 meq via ORAL
  Filled 2014-03-28 (×7): qty 1

## 2014-03-28 NOTE — Progress Notes (Addendum)
CRITICAL VALUE ALERT  Critical value received:  2.7 Potassium  Date of notification:  03/28/14  Time of notification:  700  Critical value read back:Yes.    Nurse who received alert:  Donato Heinz  MD notified (1st page):  Graves  Time of first page:  7:09  MD notified (2nd page):  Time of second page:  Responding MD:  Modena Slater  Time MD responded:  7:20

## 2014-03-28 NOTE — Evaluation (Signed)
Physical Therapy Evaluation Patient Details Name: Crystal Petersen MRN: [id number] DOB: 05-Mar-1922 Today's Date: 03/28/2014   History of Present Illness  Pt is a 80 yo female s/p R direct anterior approach THA.   Clinical Impression  Pt is s/p THA resulting in the deficits listed below (see PT Problem List). Pt tolerated OOB mobility well for the first time. Pt will benefit from skilled PT to increase their independence and safety with mobility to allow discharge to the venue listed below.      Follow Up Recommendations SNF;Supervision/Ass[facility] - [facility]    Equipment Recommendations   (defer to SNF)    Recommendations for Other Services       Precautions / Restrictions Precautions Precautions: None Restrictions Weight Bearing Restrictions: Yes RLE Weight Bearing: Weight bearing as tolerated      Mobility  Bed Mobility Overal bed mobility:  (pt received sitting up in chair)                Transfers Overall transfer level: Needs assistance Equipment used: Rolling walker (2 wheeled) Transfers: Sit to/from Stand Sit to Stand: Min assist;Mod assist Stand pivot transfers: Min assist;Mod assist       General transfer comment: v/c's for technique, hand placement, walker safety  Ambulation/Gait Ambulation/Gait assistance: Min assist Ambulation Distance (Feet): 20 Feet Assistive device: Rolling walker (2 wheeled) Gait Pattern/deviations: Step-to pattern;Decreased step length - right;Decreased stance time - right;Narrow base of support;Trunk flexed Gait velocity: slow Gait velocity interpretation: <1.8 ft/sec, indicative of risk for recurrent falls General Gait Details: v/c's for sequencing  Stairs            Wheelchair Mobility    Modified Rankin (Stroke Patients Only)       Balance Overall balance assessment: Needs assistance Sitting-balance support: Feet supported Sitting balance-Leahy Scale: Good     Standing balance  support: Bilateral upper extremity supported Standing balance-Leahy Scale: Poor Standing balance comment: requires RW for safe standing                             Pertinent Vitals/Pain Pain Assessment: 0-10 Pain Score: 3  Pain Location: R hip, shoulders s/p ambulation Pain Intervention(s): Patient requesting pain meds-RN notified    Home Living Family/patient expects to be discharged to:: Skilled nursing facility                 Additional Comments: [facility] in [facility] and was in assisted living    Prior Function Level of Independence: Needs assistance   Gait / Transfers Assistance Needed: used RW and straight cane  ADL's / Homemaking Assistance Needed: per pt report she was dressing and bathing on her own PTA and going to dining hall for 2 meals a da7        Hand Dominance   Dominant Hand: Right    Extremity/Trunk Assessment   Upper Extremity Assessment:  (onset of bilat shld pain s/p amb, most likely due to increased bilat UE support on walker)            Lower Extremity Assessment: RLE deficits/detail RLE Deficits / Details: grossly 3-/5 due to pain, able to complete LAQ    Cervical / Trunk Assessment: Kyphotic  Communication   Communication: HOH  Cognition Arousal/Alertness: Awake/alert Behavior During Therapy: WFL for tasks assessed/performed Overall Cognitive Status: No family/caregiver present to determine baseline cognitive functioning       Memory: Decreased short-term memory  General Comments      Exercises Total Joint Exercises Ankle Circles/Pumps: AROM;Both;10 reps;Seated Long Arc Quad: AROM Marching in Standing: AROM;Both;10 reps      Assessment/Plan    PT Assessment Patient needs continued PT services  PT Diagnosis Difficulty walking;Acute pain;Generalized weakness   PT Problem List Decreased strength;Decreased range of motion;Decreased activity tolerance  PT Treatment Interventions DME  instruction;Gait training;Stair training;Functional mobility training;Therapeutic activities;Therapeutic exercise;Balance training   PT Goals (Current goals can be found in the Care Plan section) Acute Rehab PT Goals Patient Stated Goal[facility] PT Goal Formulation: With patient Time For Goal Achievement: 04/11/14 Potential to Achieve Goals: Good    Frequency 7X/week   Barriers to discharge        Co-evaluation               End of Session Equipment Utilized During Treatment: Gait belt Activity Tolerance: Patient tolerated treatment well Patient left: in chair;with call bell/phone within reach Nurse Communication: Mobility status;Patient requests pain meds         Time: 0950-1012 PT Time Calculation (min) (ACUTE ONLY): 22 min   Charges:   PT Evaluation $Initial PT Evaluation Tier I: 1 Procedure PT Treatments $Gait Training: 8-22 mins   PT G CodesKingsley Callander 03/28/2014, 11:11 AM   Kittie Plater, PT, DPT Pager #: [phone] Office #: [phone]

## 2014-03-28 NOTE — Progress Notes (Signed)
Utilization review completed.  

## 2014-03-28 NOTE — Progress Notes (Signed)
Subjective: 1 Day Post-Op Procedure(s) (LRB): TOTAL HIP ARTHROPLASTY ANTERIOR APPROACH (Right) Patient reports pain as mild.  Patient is up to chair.taking by mouth and voiding ok.  Pleasantly confused which is her baseline.no reported dizziness  Objective: Vital signs in last 24 hours: Temp:  [97.2 F (36.2 C)-99 F (37.2 C)] 98 F (36.7 C) (12/15 0540) Pulse Rate:  [56-100] 86 (12/15 0540) Resp:  [11-30] 18 (12/15 0141) BP: (119-165)/(55-92) 121/92 mmHg (12/15 0540) SpO2:  [94 %-100 %] 94 % (12/15 0540) Weight:  [50.803 kg (112 lb)-56.79 kg (125 lb 3.2 oz)] 56.79 kg (125 lb 3.2 oz) (12/14 1925)  Intake/Output from previous day: 12/14 0701 - 12/15 0700 In: 3548.3 [P.O.:760; I.V.:2538.3; IV Piggyback:250] Out: 700 [Urine:350; Blood:350] Intake/Output this shift:     Recent Labs  03/28/14 0458  HGB 8.1*    Recent Labs  03/28/14 0458  WBC 7.5  RBC 2.98*  HCT 24.9*  PLT 217    Recent Labs  03/28/14 0458  NA 138  K 2.7*  CL 97  CO2 22  BUN 14  CREATININE 0.56  GLUCOSE 103*  CALCIUM 7.9*  right hip exam:  Neurovascular intact Sensation intact distally Intact pulses distally Dorsiflexion/Plantar flexion intact Incision: dressing C/D/I Compartment soft  Assessment/Plan: 1 Day Post-Op Procedure(s) (LRB): TOTAL HIP ARTHROPLASTY ANTERIOR APPROACH (Right)  Hypokalemia.. Plan: Start oral potassium. Aspirin 325 mg twice daily with SCDs for DVT prophylaxis. Up with therapy Discharge to [facility] in a few days.  Helon Wisinski G 03/28/2014, 10:10 AM

## 2014-03-28 NOTE — Evaluation (Signed)
Occupational Therapy Evaluation Patient Details Name: Crystal Petersen MRN: [id number] DOB: February 02, 1922 Today's Date: 03/28/2014    History of Present Illness Pt is a 80 y/o female s/p total hip arthroplasty right, WBAT.   Clinical Impression   Pt presents s/p total hip arthroplasty on 03/27/14 R hip. She is HOH and reports poor vision at baseline, and appears to have cognitive deficits as well (no family present for baseline). Pt should benefit from acute OT to address problems as listed below (see problem section) in preparation for d/c to SNF for rehab.     Follow Up Recommendations  SNF;Supervision/Assistance - 24 hour    Equipment Recommendations  Other (comment) (Defer to next venue)    Recommendations for Other Services       Precautions / Restrictions        Mobility Bed Mobility Overal bed mobility:  (Pt received up in chair)                Transfers Overall transfer level: Needs assistance Equipment used: Rolling walker (2 wheeled) Transfers: Sit to/from [facility] Sit to Stand: Min assist;Mod assist Stand pivot transfers: Min assist;Mod assist       General transfer comment: VC's for safety and sequencing, Mod A w/ RW as pt realized she was incontinent of bowels before, during and after transfer to 3:1. Min A when transferring from 3:1 to recliner using RW.    Balance Overall balance assessment: Needs assistance Sitting-balance support: Feet supported;No upper extremity supported Sitting balance-Leahy Scale: Good     Standing balance support: Bilateral upper extremity supported Standing balance-Leahy Scale: Fair                              ADL Overall ADL's : Needs assistance/impaired Eating/Feeding: Independent;Set up;Sitting   Grooming: Wash/dry hands;Wash/dry face;Set up;Sitting   Upper Body Bathing: Set up;Supervision/ safety;Sitting   Lower Body Bathing: Sit to/from stand;Moderate assistance;Maximal  assistance   Upper Body Dressing : Min guard;Set up;Sitting   Lower Body Dressing: Maximal assistance;Sit to/from stand   Toilet Transfer: BSC;RW;Ambulation;Moderate assistance;Stand-pivot;Cueing for sequencing;Cueing for safety   Toileting- Clothing Manipulation and Hygiene: Total assistance;Sit to/from stand       Functional mobility during ADLs: Minimal assistance;Rolling walker;Cueing for safety;Cueing for sequencing General ADL Comments: Pt seen for OT assessment today and then participated in ADL retraining session. Upon entering room, pt told OT she had an "accident in this chair" pt was assisted to standing and was noted to have very loose stool in chair, during transfer, pt had incontinent BM again and again on 3:1 noted. Pt was assisted in hygeine and clean up and role of OT was discussed. Pt is HOH , wears hearing aids and appears to have STM deficits, cognitive deficits. No family was present to determine baseline. Pt also states that her vision "is just not good", has glassess, is not wearing them. She will benefit from acute OT to assist in maximizing independence w/ ADL's and functional transfers during ADL's until she is d/c to SNF ([facility]) for rehab.     Vision  Pt states that she has glasses. "My vision is not good"                   Perception     Praxis      Pertinent Vitals/Pain Pain Assessment: No/denies pain     Hand Dominance Right   Extremity/Trunk Assessment Upper Extremity Assessment  Upper Extremity Assessment: Overall WFL for tasks assessed;Generalized weakness   Lower Extremity Assessment Lower Extremity Assessment: Defer to PT evaluation       Communication Communication Communication: HOH   Cognition Arousal/Alertness: Awake/alert Behavior During Therapy: WFL for tasks assessed/performed Overall Cognitive Status: No family/caregiver present to determine baseline cognitive functioning (HOH as well)       Memory: Decreased  short-term memory             General Comments       Exercises       Shoulder Instructions      Home Living Family/patient expects to be discharged to:: Skilled nursing facility ([facility])                                        Prior Functioning/Environment Level of Independence: Needs assistance (No family present, very HOH, hx per pt report)  Gait / Transfers Assistance Needed: Uses walker  ADL's / Homemaking Assistance Needed: Requires "A lot of help" for bathing and dressing, etc per pt report        OT Diagnosis: Generalized weakness;Acute pain   OT Problem List: Decreased strength;Decreased activity tolerance;Impaired balance (sitting and/or standing);Decreased knowledge of use of DME or AE;Decreased safety awareness;Decreased cognition;Pain   OT Treatment/Interventions: Self-care/ADL training;DME and/or AE instruction;Patient/family education;Visual/perceptual remediation/compensation;Cognitive remediation/compensation;Therapeutic activities;Balance training;Therapeutic exercise    OT Goals(Current goals can be found in the care plan section) Acute Rehab OT Goals Patient Stated Goal: "I guess I'll go to the [facility], I don't even know where I've been before that, been there so long" Time For Goal Achievement: 04/11/14 Potential to Achieve Goals: Good  OT Frequency: Min 2X/week   Barriers to D/C:            Co-evaluation              End of Session Equipment Utilized During Treatment: Gait belt;Rolling walker Nurse Communication: Mobility status;Other (comment) (Pt incontinent of loose bowels before, during and after transfer. Pt states she wears pull ups at [facility] "sometimes")  Activity Tolerance: Patient tolerated treatment well Patient left: in chair   Time: 0827-0904 OT Time Calculation (min): 37 min Charges:  OT General Charges $OT Visit: 1 Procedure OT Evaluation $Initial OT Evaluation Tier I: 1 Procedure OT  Treatments $Self Care/Home Management : 23-37 mins (24 min) G-Codes:    Josephine Igo Dixon 03/28/2014, 9:20 AM

## 2014-03-29 DIAGNOSIS — D62 Acute posthemorrhagic anemia: Secondary | ICD-10-CM

## 2014-03-29 HISTORY — DX: Acute posthemorrhagic anemia: D62

## 2014-03-29 LAB — CBC
HEMATOCRIT: 23.3 % — AB (ref 36.0–46.0)
Hemoglobin: 7.9 g/dL — ABNORMAL LOW (ref 12.0–15.0)
MCH: 28.5 pg (ref 26.0–34.0)
MCHC: 33.9 g/dL (ref 30.0–36.0)
MCV: 84.1 fL (ref 78.0–100.0)
Platelets: 203 10*3/uL (ref 150–400)
RBC: 2.77 MIL/uL — ABNORMAL LOW (ref 3.87–5.11)
RDW: 14.4 % (ref 11.5–15.5)
WBC: 10.4 10*3/uL (ref 4.0–10.5)

## 2014-03-29 LAB — BASIC METABOLIC PANEL
Anion gap: 13 (ref 5–15)
BUN: 14 mg/dL (ref 6–23)
CO2: 23 mEq/L (ref 19–32)
Calcium: 8.4 mg/dL (ref 8.4–10.5)
Chloride: 96 mEq/L (ref 96–112)
Creatinine, Ser: 0.71 mg/dL (ref 0.50–1.10)
GFR calc non Af Amer: 74 mL/min — ABNORMAL LOW (ref >90)
GFR, EST AFRICAN AMERICAN: 85 mL/min — AB (ref >90)
GLUCOSE: 104 mg/dL — AB (ref 70–99)
POTASSIUM: 3.5 meq/L — AB (ref 3.7–5.3)
SODIUM: 132 meq/L — AB (ref 137–147)

## 2014-03-29 MED ORDER — FERROUS SULFATE 325 (65 FE) MG PO TABS
325.0000 mg | ORAL_TABLET | Freq: Two times a day (BID) | ORAL | Status: DC
  Administered 2014-03-29 – 2014-03-30 (×2): 325 mg via ORAL
  Filled 2014-03-29 (×5): qty 1

## 2014-03-29 NOTE — Progress Notes (Signed)
Physical Therapy Treatment Patient Details Name: Crystal Petersen MRN: [id number] DOB: Dec 24, 1921 Today's Date: 03/29/2014    History of Present Illness Pt is a 80 yo female s/p R direct anterior approach THA.     PT Comments    Pt progressing with mobility and ambulation but still remains at increased falls risk and cont to require assist for mobility and ADLs. Pt to con't to benefit from SNF upon d/c.  Follow Up Recommendations  SNF;Supervision/Assistance - 24 hour     Equipment Recommendations       Recommendations for Other Services       Precautions / Restrictions Precautions Precautions: Fall Restrictions Weight Bearing Restrictions: Yes RLE Weight Bearing: Weight bearing as tolerated    Mobility  Bed Mobility Overal bed mobility: Needs Assistance Bed Mobility: Supine to Sit     Supine to sit: Mod assist;HOB elevated     General bed mobility comments: used bed rail  Transfers Overall transfer level: Needs assistance Equipment used: Rolling walker (2 wheeled) Transfers: Sit to/from [facility] Sit to Stand: Min assist;Mod assist Stand pivot transfers: Min assist;Mod assist       General transfer comment: v/c's for technique  Ambulation/Gait Ambulation/Gait assistance: Min assist Ambulation Distance (Feet): 75 Feet Assistive device: Rolling walker (2 wheeled) Gait Pattern/deviations: Antalgic;Decreased stride length;Step-through pattern (decreased step height) Gait velocity: slow Gait velocity interpretation: <1.8 ft/sec, indicative of risk for recurrent falls General Gait Details: v/c's for sequencing   Stairs            Wheelchair Mobility    Modified Rankin (Stroke Patients Only)       Balance           Standing balance support: Bilateral upper extremity supported Standing balance-Leahy Scale: Poor Standing balance comment: requires RW for safe standing/amb                    Cognition  Arousal/Alertness: Awake/alert Behavior During Therapy: WFL for tasks assessed/performed Overall Cognitive Status:  (h/o confusion per dtr)       Memory: Decreased short-term memory              Exercises Total Joint Exercises Ankle Circles/Pumps: AROM;Both;10 reps;Seated Long Arc Quad: AROM Marching in Standing: AROM;Both;10 reps    General Comments General comments (skin integrity, edema, etc.): pt dependent for safe hygiene s/p tolieting. pt minA with std pvt to bsc      Pertinent Vitals/Pain Pain Assessment: 0-10 Pain Score: 8  Pain Location: R hip Pain Intervention(s): Patient requesting pain meds-RN notified    Home Living                      Prior Function            PT Goals (current goals can now be found in the care plan section) Progress towards PT goals: Progressing toward goals    Frequency  7X/week    PT Plan Current plan remains appropriate    Co-evaluation             End of Session Equipment Utilized During Treatment: Gait belt Activity Tolerance: Patient tolerated treatment well Patient left: in chair;with call bell/phone within reach     Time: 1155-1220 PT Time Calculation (min) (ACUTE ONLY): 25 min  Charges:  $Gait Training: 8-22 mins $Therapeutic Exercise: 8-22 mins                    G Codes:  Crystal Petersen 03/29/2014, 1:55 PM   Crystal Petersen, PT, DPT Pager #: [phone] Office #: [phone]

## 2014-03-29 NOTE — Progress Notes (Signed)
Subjective: 2 Days Post-Op Procedure(s) (LRB): TOTAL HIP ARTHROPLASTY ANTERIOR APPROACH (Right) Patient reports pain as mild.   Eating breakfast in bed. No complaints of dizziness. Her vital signs are stable.  Objective: Vital signs in last 24 hours: Temp:  [98.1 F (36.7 C)-98.5 F (36.9 C)] 98.1 F (36.7 C) (12/16 0432) Pulse Rate:  [82-92] 90 (12/16 0432) Resp:  [16-18] 18 (12/16 0432) BP: (106-131)/(47-57) 131/57 mmHg (12/16 0432) SpO2:  [95 %-98 %] 96 % (12/16 0432)  Intake/Output from previous day: 12/15 0701 - 12/16 0700 In: 480 [P.O.:480] Out: -  Intake/Output this shift:     Recent Labs  03/28/14 0458 03/29/14 0644  HGB 8.1* 7.9*    Recent Labs  03/28/14 0458 03/29/14 0644  WBC 7.5 10.4  RBC 2.98* 2.77*  HCT 24.9* 23.3*  PLT 217 203    Recent Labs  03/28/14 0458  NA 138  K 2.7*  CL 97  CO2 22  BUN 14  CREATININE 0.56  GLUCOSE 103*  CALCIUM 7.9*   No results for input(s): LABPT, INR in the last 72 hours. Right hip exam: Neurovascular intact Sensation intact distally Intact pulses distally Dorsiflexion/Plantar flexion intact Incision: dressing C/D/I Compartment soft  Assessment/Plan: 2 Days Post-Op Procedure(s) (LRB): TOTAL HIP ARTHROPLASTY ANTERIOR APPROACH (Right) Acute blood loss anemia, expected. Hypokalemia. Plan: Check bmet this morning. Continue oral potassium. Start oral iron supplementation for anemia. Social work consult for discharge to skilled nursing facility hopefully tomorrow. She lives at [facility] and needs to go to the skilled [facility] there. Continue oral aspirin/SCDs for DVT prophylaxis.   Up with therapy Discharge to SNF hopefully tomorrow.  Dellar Traber G 03/29/2014, 8:31 AM

## 2014-03-29 NOTE — Plan of Care (Signed)
Problem: Consults Goal: Diagnosis- Total Joint Replacement Outcome: Completed/Met Date Met:  03/29/14 Primary Total Hip Right

## 2014-03-29 NOTE — Clinical Social Work Psychosocial (Signed)
Clinical Social Work Department BRIEF PSYCHOSOCIAL ASSESSMENT 03/29/2014  Patient:  Crystal Petersen, Crystal Petersen     Account Number:  [id number]     Admit date:  03/27/2014  Clinical Social Worker:  Delrae Sawyers  Date/Time:  03/29/2014 01:04 PM  Referred by:  Physician  Date Referred:  03/29/2014 Referred for  SNF Placement   Other Referral:   none.   Interview type:  Family Other interview type:   CSW spoke with pt's daughter, Crystal Petersen.    PSYCHOSOCIAL DATA Living Status:  FACILITY Admitted from facility:  [facility] Level of care:  Independent Living Primary support name:  Crystal Petersen Primary support relationship to patient:  CHILD, ADULT Degree of support available:   Strong support system.    CURRENT CONCERNS Current Concerns  Post-Acute Placement   Other Concerns:   none.    SOCIAL WORK ASSESSMENT / PLAN CSW received referral for possible SNF placement at time of discharge. CSW contacted by [place] stating pt from [place] and to return to [facility] SNF once medically stable for discharge. CSW spoke with pt's daughter, Crystal Petersen, regarding discharge disposition. Pt's daughter confirmed discharge plan of [facility] SNF once pt medically stable.    CSW to continue to follow and assist with discharge planning needs.   Assessment/plan status:  Psychosocial Support/Ongoing Assessment of Needs Other assessment/ plan:   none.   Information/referral to community resources:   Pt to return to [facility]    PATIENT'S/FAMILY'S RESPONSE TO PLAN OF CARE: Pt's daughter understanding and agreeable to CSW plan of care. Pt's daughter expressed no further questions or concerns at this time.       Lubertha Sayres, [place] ([phone]) Licensed Clinical Social Worker Orthopedics [phone]) and Surgical [phone])

## 2014-03-29 NOTE — Brief Op Note (Signed)
03/27/2014  12:22 PM  PATIENT:  Crystal Petersen  80 y.o. female  PRE-OPERATIVE DIAGNOSIS:  SEVERE DEGENERATIVE JOINT DISEASE RIGHT HIP  POST-OPERATIVE DIAGNOSIS:  SEVERE DEGENERATIVE JOINT DISEASE RIGHT HIP  PROCEDURE:  Procedure(s): TOTAL HIP ARTHROPLASTY ANTERIOR APPROACH (Right)  SURGEON:  Surgeon(s) and Role:    * Alta Corning, MD - Primary  PHYSICIAN ASSISTANT:   ASSISTANTS: bethune   ANESTHESIA:   spinal  EBL:  Total I/O In: 240 [P.O.:240] Out: -   BLOOD ADMINISTERED:none  DRAINS: none   LOCAL MEDICATIONS USED:  MARCAINE     SPECIMEN:  No Specimen  DISPOSITION OF SPECIMEN:  N/A  COUNTS:  YES  TOURNIQUET:  * No tourniquets in log *  DICTATION: .Other Dictation: Dictation Number [id number]  PLAN OF CARE: Admit to inpatient   PATIENT DISPOSITION:  PACU - hemodynamically stable.   Delay start of Pharmacological VTE agent (>24hrs) due to surgical blood loss or risk of bleeding: no

## 2014-03-29 NOTE — Op Note (Signed)
Crystal Petersen, KASEMAN NO.:  [id number]  MEDICAL RECORD NO.:  [id number]  LOCATION:  [id number]                        FACILITY:  [facility]  PHYSICIAN:  Alta Corning, M.D.   DATE OF BIRTH:  Jan 27, 1922  DATE OF PROCEDURE:  03/29/2014 DATE OF DISCHARGE:                              OPERATIVE REPORT   PREOPERATIVE DIAGNOSIS:  End-stage degenerative joint disease, right hip.  POSTOPERATIVE DIAGNOSIS:  End-stage degenerative joint disease, right hip.  PROCEDURE:  Right total hip replacement with a Corail stem size 13, 48 mm cup, Pinnacle Gripton fully coated with no screw holes, a 32 mm +0 hip ball, and a high offset varus stem.  SURGEON:  Alta Corning, M.D.  ASSISTANT:  Gary Fleet, P.A.  ANESTHESIA:  General.  BRIEF HISTORY:  Ms. Shoaf is a 80 year old female with a history of severe arthritis of the right hip.  She failed all conservative care, and after failure of all conservative care; she was taken to the operating room for right total hip replacement.  DESCRIPTION OF PROCEDURE:  The patient was taken to the operative room. After adequate anesthesia was obtained with spinal, the patient was placed supine on the operating table, onto the [facility] bed.  Once this was done, the patient was prepped and draped in usual sterile fashion. Following this, an incision made in the anterior aspect of the thigh for an anterior approach to the hip.  Subcutaneous tissue was dissected down to the level of the tensor fascia.  Tensor fascia was incised longitudinally, and the muscle was divided back to the hip joint. Retractors put in place above and below the hip joint and a capsule was opened and tagged.  Retractors were placed, and a provisional neck cut was made and the head removed.  Following this, attention was turned to the acetabulum, sequentially reamed to a level of 47 and a 48 mm Gripton Pinnacle cup with a single central hole was hammered into place,  45 degrees of lateral opening, 20 degrees of anteversion, and a central hole eliminator was placed and the final poly was placed at this point. Cup was excellently positioned.  Following this, attention was turned towards placement of the arm underneath the femur.  Once this was done, the hip was externally rotated, went down over position, and retractors were then placed and the femoral neck was opened and then sequentially rasped up to a level of a size 13 broach and the size 13 broach was placed, calcar planer was used.  Went for reduction, really had a difficult tight reduction.  At that point, we went to a KLA high offset stem.  This gave [place] a great symmetric leg lengths and nice stability. Once this was done, the trials were removed.  The final high offset Corail size 13 stem was placed and a +0 32 mm ball was placed and the hip reduced.  Excellent range of motion and stability were achieved. The hip capsule was then closed with Vicryl running, and then the tensor fascia was closed with a #1 after copious irrigation and then after instilling tranexamic acid in the wound for 2 minutes and then being removed.  Once this was done,  attention was turned towards the closure of the skin.  It was closed with 0 and 2-0 Vicryl, 3-0 Monocryl subcuticular.  Benzoin and Steri- Strips were applied.  Sterile compressive dressing was applied, and the patient was taken to recovery, and she was noted to be in satisfactory condition.  Estimated blood for the procedure 250 mL.     Alta Corning, M.D.     Corliss Skains  D:  03/29/2014  T:  03/29/2014  Job:  [id number]

## 2014-03-30 ENCOUNTER — Non-Acute Institutional Stay (SKILLED_NURSING_FACILITY): Payer: Medicare Other | Admitting: Internal Medicine

## 2014-03-30 ENCOUNTER — Telehealth: Payer: Self-pay | Admitting: Internal Medicine

## 2014-03-30 DIAGNOSIS — D62 Acute posthemorrhagic anemia: Secondary | ICD-10-CM

## 2014-03-30 DIAGNOSIS — M25551 Pain in right hip: Secondary | ICD-10-CM

## 2014-03-30 DIAGNOSIS — E039 Hypothyroidism, unspecified: Secondary | ICD-10-CM

## 2014-03-30 DIAGNOSIS — I10 Essential (primary) hypertension: Secondary | ICD-10-CM

## 2014-03-30 DIAGNOSIS — R2681 Unsteadiness on feet: Secondary | ICD-10-CM

## 2014-03-30 LAB — CBC
HCT: 22.7 % — ABNORMAL LOW (ref 36.0–46.0)
Hemoglobin: 7.3 g/dL — ABNORMAL LOW (ref 12.0–15.0)
MCH: 26.4 pg (ref 26.0–34.0)
MCHC: 32.2 g/dL (ref 30.0–36.0)
MCV: 82.2 fL (ref 78.0–100.0)
PLATELETS: 234 10*3/uL (ref 150–400)
RBC: 2.76 MIL/uL — AB (ref 3.87–5.11)
RDW: 14.7 % (ref 11.5–15.5)
WBC: 8.4 10*3/uL (ref 4.0–10.5)

## 2014-03-30 MED ORDER — OXYCODONE-ACETAMINOPHEN 5-325 MG PO TABS: 1.0000 | ORAL_TABLET | Freq: Three times a day (TID) | ORAL | Status: DC | PRN

## 2014-03-30 MED ORDER — FERROUS SULFATE 325 (65 FE) MG PO TABS: 325.0000 mg | ORAL_TABLET | Freq: Two times a day (BID) | ORAL | Status: DC

## 2014-03-30 NOTE — Progress Notes (Signed)
Report called to Otelia Sergeant receiving nurse at [facility]. D/C in stable condition with family, sent with copy of chart.

## 2014-03-30 NOTE — Telephone Encounter (Signed)
KATHY FROM DR SPENCER [facility] OFFICE, SAID PATIENT WAS A NO SHOW TO REFERRAL APPT BY OUR PRACTICE.  Thank you, Crystal Petersen [facility] [phone] Fax [phone]

## 2014-03-30 NOTE — Discharge Summary (Signed)
Patient ID: Crystal Petersen MRN: [id number] DOB/AGE: [birth year] 78 y.o.  Admit date: 03/27/2014 Discharge date: 03/30/2014  Admission Diagnoses:  Principal Problem:   Primary osteoarthritis of right hip Active Problems:   Hypokalemia   Acute blood loss anemia   Discharge Diagnoses:  Same  Past Medical History  Diagnosis Date  . Hypertension   . Nonspecific abnormal electrocardiogram (ECG) (EKG)   . Stricture and stenosis of esophagus   . Esophageal reflux   . Diverticulosis of colon (without mention of hemorrhage)   . Benign neoplasm of colon   . Macular degeneration   . Osteoarthritis     osteroperosis  . Other and unspecified hyperlipidemia   . HTN (hypertension)   . Shingles   . Cystocele   . Gastroparesis   . Skin cancer (melanoma)   . Hypothyroidism   . Cancer     pre-melanoma  . Hemorrhoids     Surgeries: Procedure(s): TOTAL HIP ARTHROPLASTY ANTERIOR APPROACH on 03/27/2014    Discharged Condition: Improved  Hospital Course: Crystal Petersen is an 80 y.o. female who was admitted 03/27/2014 for operative treatment ofPrimary osteoarthritis of right hip. Patient has severe unremitting pain that affects sleep, daily activities, and work/hobbies. After pre-op clearance the patient was taken to the operating room on 03/27/2014 and underwent  Procedure(s):right TOTAL HIP ARTHROPLASTY ANTERIOR APPROACH.    Patient was given perioperative antibiotics: Anti-infectives    Start     Dose/Rate Route Frequency Ordered Stop   03/27/14 1800  ceFAZolin (ANCEF) IVPB 2 g/50 mL premix     2 g100 mL/hr over 30 Minutes Intravenous Every 6 hours 03/27/14 1507 03/28/14 0430   03/27/14 0600  ceFAZolin (ANCEF) IVPB 2 g/50 mL premix     2 g100 mL/hr over 30 Minutes Intravenous On call to O.R. 03/26/14 1413 03/27/14 1230       Patient was given sequential compression devices, early ambulation, and chemoprophylaxis to prevent DVT.she did have hypokalemia, which was   She also had  acuteblood loss anemia, asymptomatic.  She was treated with  Close monitoring and oral iron.  Patient benefited maximally from hospital stay and there were no complications.    Recent vital signs: Patient Vitals for the past 24 hrs:  BP Temp Temp src Pulse Resp SpO2  03/30/14 0602 (!) 127/59 mmHg 98.8 F (37.1 C) Oral 84 - 95 %  03/29/14 2012 (!) 130/58 mmHg 98.5 F (36.9 C) Oral 78 - 96 %  03/29/14 1300 (!) 119/54 mmHg 98 F (36.7 C) - 88 16 97 %     Recent laboratory studies:  Recent Labs  03/28/14 0458 03/29/14 0644 03/29/14 1040 03/30/14 0535  WBC 7.5 10.4  --  8.4  HGB 8.1* 7.9*  --  7.3*  HCT 24.9* 23.3*  --  22.7*  PLT 217 203  --  234  NA 138  --  132*  --   K 2.7*  --  3.5*  --   CL 97  --  96  --   CO2 22  --  23  --   BUN 14  --  14  --   CREATININE 0.56  --  0.71  --   GLUCOSE 103*  --  104*  --   CALCIUM 7.9*  --  8.4  --      Discharge Medications:     Medication List    TAKE these medications        aspirin EC 325 MG tablet  Take  1 tablet (325 mg total) by mouth 2 (two) times daily after a meal.     CAL-GEST ANTACID 500 MG chewable tablet  Generic drug:  calcium carbonate  Chew 1 tablet by mouth as needed.     docusate sodium 100 MG capsule  Commonly known as:  COLACE  Take 100 mg by mouth 2 (two) times daily as needed.     ferrous sulfate 325 (65 FE) MG tablet  Take 1 tablet (325 mg total) by mouth 2 (two) times daily with a meal.     labetalol 100 MG tablet  Commonly known as:  NORMODYNE  TAKE 1 TABLET BY MOUTH TWICE A DAY FOR BP     levothyroxine 100 MCG tablet  Commonly known as:  SYNTHROID  Take 1 tablet (100 mcg total) by mouth daily.     methocarbamol 500 MG tablet  Commonly known as:  ROBAXIN-750  Take 1 tablet (500 mg total) by mouth every 8 (eight) hours as needed for muscle spasms.     mupirocin ointment 2 %  Commonly known as:  BACTROBAN  daily.     omeprazole 40 MG capsule  Commonly known as:  PRILOSEC  TAKE ONE  CAPSULE BY MOUTH EVERY DAY FOR ACID REFLUX     oxyCODONE-acetaminophen 5-325 MG per tablet  Commonly known as:  PERCOCET/ROXICET  Take 1 tablet by mouth every 8 (eight) hours as needed for severe pain.     polyethylene glycol packet  Commonly known as:  MIRALAX / GLYCOLAX  Take 17 g by mouth as needed for mild constipation.     traMADol 50 MG tablet  Commonly known as:  ULTRAM  Take 1 tablet (50 mg total) by mouth every 6 (six) hours as needed.     Vitamin D 2000 UNITS tablet  Take 4,000 Units by mouth daily.     ZETIA 10 MG tablet  Generic drug:  ezetimibe  Take 10 mg by mouth daily.        Diagnostic Studies: Dg Chest 2 View  03/20/2014   CLINICAL DATA:  Preoperative chest x-ray for left total hip arthroplasty, anterior approach.  EXAM: CHEST  2 VIEW  COMPARISON:  None.  FINDINGS: The heart is mildly enlarged. There is no edema or effusion to suggest failure.  Severe emphysematous changes are present bilaterally. Asymmetric opacities are present at the left base. These likely reflect scarring. A posterior inferior left granuloma is present. Atherosclerotic changes are noted at the aortic arch. Degenerative changes are present in the thoracic spine with exaggerated kyphosis. A superior endplate compression fracture is present at L2.  IMPRESSION: 1. Severe emphysema. 2. Asymmetric left basilar opacities likely reflects scarring. This does not have the typical appearance of acute disease. Comparison with prior radiographs would be helpful. 3. No other acute disease. 4. Atherosclerosis. 5. Age indeterminate L2 compression fracture.   Electronically Signed   By: Lawrence Santiago M.D.   On: 03/20/2014 15:28   Dg Hip Operative Right  03/27/2014   CLINICAL DATA:  Status post arthroplasty  EXAM: DG OPERATIVE RIGHT HIP 1-2 VIEWS  COMPARISON:  Sep 08, 2012  FINDINGS: There is a total hip prosthesis on the right with prosthetic components appearing well-seated. No fracture or dislocation.   IMPRESSION: Prosthetic components well-seated. No acute fracture or dislocation.   Electronically Signed   By: Lowella Grip M.D.   On: 03/27/2014 16:11   Dg Pelvis Portable  03/27/2014   CLINICAL DATA:  Status post total hip arthroplasty  EXAM: PORTABLE PELVIS 1-2 VIEWS  COMPARISON:  None.  FINDINGS: There is a total hip prosthesis on the right which is well seated. There are 2 screws in the proximal left femur; prior fracture in this area is healed. There is evidence of old healed trauma in the medial pubis region on the left. No acute fracture or dislocation. There is moderate osteoarthritic change in the left hip joint.  IMPRESSION: Total hip prosthesis on the right which is well seated. Posttraumatic change on the left. Moderate narrowing left hip joint. No acute fracture or dislocation.   Electronically Signed   By: Lowella Grip M.D.   On: 03/27/2014 15:45    Disposition: skilled nursing facility.      Discharge Instructions    Call MD / Call 911    Complete by:  As directed   If you experience chest pain or shortness of breath, CALL 911 and be transported to the hospital emergency room.  If you develope a fever above 101 F, pus (white drainage) or increased drainage or redness at the wound, or calf pain, call your surgeon's office.     Constipation Prevention    Complete by:  As directed   Drink plenty of fluids.  Prune juice may be helpful.  You may use a stool softener, such as Colace (over the counter) 100 mg twice a day.  Use MiraLax (over the counter) for constipation as needed.     Do not sit on low chairs, stoools or toilet seats, as it may be difficult to get up from low surfaces    Complete by:  As directed      Follow the hip precautions as taught in Physical Therapy    Complete by:  As directed      Increase activity slowly as tolerated    Complete by:  As directed      Petersen bearing as tolerated    Complete by:  As directed   Laterality:  right  Extremity:   Lower     Petersen bearing as tolerated    Complete by:  As directed   Laterality:  right  Extremity:  Lower         check CBC in one week.  Follow-up Information    Follow up with GRAVES,JOHN L, MD. Schedule an appointment as soon as possible for a visit in 2 weeks.   Specialty:  Orthopedic Surgery   Contact information:   [place] [id number] [phone]        Signed: Erlene Senters 03/30/2014, 10:37 AM

## 2014-03-30 NOTE — Clinical Social Work Note (Signed)
Pt to be discharged to [facility] (SNF). Pt and pt's daughter updated at bedside.  Facility: [facility] (SNF) Report number: [phone] Transportation: Pt's daughter providing transportation, facility aware.  [facility] Medicare SNF authorization: [id number]  Lubertha Sayres, Latanya Presser [phone]) Licensed Clinical Social Worker Orthopedics [phone]) and Surgical [phone])

## 2014-03-30 NOTE — Progress Notes (Signed)
Subjective: 3 Days Post-Op Procedure(s) (LRB): TOTAL HIP ARTHROPLASTY ANTERIOR APPROACH (Right) Patient reports pain as mild.    Objective: Vital signs in last 24 hours: Temp:  [98 F (36.7 C)-98.8 F (37.1 C)] 98.8 F (37.1 C) (12/17 0602) Pulse Rate:  [78-88] 84 (12/17 0602) Resp:  [16] 16 (12/16 1300) BP: (119-130)/(54-59) 127/59 mmHg (12/17 0602) SpO2:  [95 %-97 %] 95 % (12/17 0602)  Intake/Output from previous day: 12/16 0701 - 12/17 0700 In: 240 [P.O.:240] Out: -  Intake/Output this shift:     Recent Labs  03/28/14 0458 03/29/14 0644 03/30/14 0535  HGB 8.1* 7.9* 7.3*    Recent Labs  03/29/14 0644 03/30/14 0535  WBC 10.4 8.4  RBC 2.77* 2.76*  HCT 23.3* 22.7*  PLT 203 234    Recent Labs  03/28/14 0458 03/29/14 1040  NA 138 132*  K 2.7* 3.5*  CL 97 96  CO2 22 23  BUN 14 14  CREATININE 0.56 0.71  GLUCOSE 103* 104*  CALCIUM 7.9* 8.4   No results for input(s): LABPT, INR in the last 72 hours. Right hip exam: Neurovascular intact Sensation intact distally Intact pulses distally Dorsiflexion/Plantar flexion intact Incision: no drainage Compartment soft  Assessment/Plan: 3 Days Post-Op Procedure(s) (LRB): TOTAL HIP ARTHROPLASTY ANTERIOR APPROACH (Right)  Acute blood loss anemia.expected.  Plan: Continue oral iron 325 mg twice a day Check CBC in one week.  Discharge to SNF .  Followup with Dr. Berenice Primas in 2 weeks. [place] G 03/30/2014, 10:26 AM

## 2014-04-03 ENCOUNTER — Encounter: Payer: Self-pay | Admitting: Nurse Practitioner

## 2014-04-03 ENCOUNTER — Non-Acute Institutional Stay (SKILLED_NURSING_FACILITY): Payer: Medicare Other | Admitting: Nurse Practitioner

## 2014-04-03 DIAGNOSIS — E039 Hypothyroidism, unspecified: Secondary | ICD-10-CM

## 2014-04-03 DIAGNOSIS — M25551 Pain in right hip: Secondary | ICD-10-CM

## 2014-04-03 DIAGNOSIS — K219 Gastro-esophageal reflux disease without esophagitis: Secondary | ICD-10-CM

## 2014-04-03 DIAGNOSIS — D62 Acute posthemorrhagic anemia: Secondary | ICD-10-CM

## 2014-04-03 DIAGNOSIS — E876 Hypokalemia: Secondary | ICD-10-CM

## 2014-04-03 DIAGNOSIS — E559 Vitamin D deficiency, unspecified: Secondary | ICD-10-CM

## 2014-04-03 DIAGNOSIS — I10 Essential (primary) hypertension: Secondary | ICD-10-CM

## 2014-04-03 DIAGNOSIS — K5900 Constipation, unspecified: Secondary | ICD-10-CM

## 2014-04-03 NOTE — Assessment & Plan Note (Signed)
Asymptomatic, continue Omeprazole 40mg  daily.

## 2014-04-03 NOTE — Assessment & Plan Note (Signed)
Post op, continue po Fe, Hgb 7.3 03/30/14, update CBC in am

## 2014-04-03 NOTE — Assessment & Plan Note (Signed)
Controlled, takes Labetalol 100mg  bid.

## 2014-04-03 NOTE — Assessment & Plan Note (Signed)
Takes Levothyroxine 147mcg po daily. Update TSH and CMP

## 2014-04-03 NOTE — Assessment & Plan Note (Signed)
Continue to take Vit D supplement.

## 2014-04-03 NOTE — Progress Notes (Signed)
Patient ID: Crystal Petersen, female   DOB: 11-Dec-1921, 80 y.o.   MRN: [id number]   Code Status: DNR  Allergies  Allergen Reactions  . Ace Inhibitors     Elevated potassium  . Fosamax [Alendronate Sodium]   . Metoclopramide Hcl Other (See Comments)    Pt. Not sure  . Reglan [Metoclopramide]   . Statins Other (See Comments)    myalgia  . Sulfa Antibiotics   . Vesicare [Solifenacin]   . Vicodin [Hydrocodone-Acetaminophen] Other (See Comments)    "felt spaced out"  . Sulfonamide Derivatives Rash    Chief Complaint  Patient presents with  . Medical Management of Chronic Issues  . Acute Visit    Right hip pain, constipation    HPI: Patient is a 80 y.o. female seen in the SNF at [facility] today for evaluation of R hip pain, constipation,  and other chronic medical conditions.  Problem List Items Addressed This Visit    Vitamin D deficiency    Continue to take Vit D supplement.     Right hip pain - Primary    IMPRESSION: Total hip prosthesis on the right which is well seated. Posttraumatic change on the left. Moderate narrowing left hip joint. No acute fracture or dislocation. 04/03/14 will schedule Oxycodone/ApAp 5/325 8am +8pm, Tramadol 50mg  1pm in addition to prn. Continue PT/OT. Continue ASA 325mg  bid for post op DVT/PE risk reduction.     Hypothyroidism    Takes Levothyroxine 128mcg po daily. Update TSH and CMP    Hypokalemia    K 3.5 03/30/14, update CMP    GERD    Asymptomatic, continue Omeprazole 40mg  daily.     Essential hypertension    Controlled, takes Labetalol 100mg  bid.     Constipation    No BM for several days. Will MiraLax 17gm q2h until BM then Senna S II qhs.     Acute blood loss anemia    Post op, continue po Fe, Hgb 7.3 03/30/14, update CBC in am       Review of Systems:  Review of Systems  Constitutional: Positive for malaise/fatigue. Negative for fever, chills, weight loss and diaphoresis.  HENT: Positive for hearing loss.  Negative for congestion, ear discharge, ear pain, nosebleeds, sore throat and tinnitus.   Eyes: Negative for blurred vision, double vision, photophobia, pain, discharge and redness.  Respiratory: Negative for cough, hemoptysis, sputum production, shortness of breath, wheezing and stridor.   Cardiovascular: Positive for leg swelling. Negative for chest pain, palpitations, orthopnea, claudication and PND.       Right leg  Gastrointestinal: Positive for constipation. Negative for heartburn, nausea, vomiting, abdominal pain, diarrhea, blood in stool and melena.  Genitourinary: Negative for dysuria, urgency, frequency, hematuria and flank pain.  Musculoskeletal: Positive for joint pain. Negative for myalgias, back pain, falls and neck pain.       Right hip pain  Skin: Negative for itching and rash.       Right hip surgical incision intact. RLE mild swelling.   Neurological: Negative for dizziness, tingling, tremors, sensory change, speech change, focal weakness, seizures, loss of consciousness, weakness and headaches.  Endo/Heme/Allergies: Negative for environmental allergies and polydipsia. Bruises/bleeds easily.  Psychiatric/Behavioral: Negative for depression, suicidal ideas, hallucinations, memory loss and substance abuse. The patient is not nervous/anxious and does not have insomnia.      Past Medical History  Diagnosis Date  . Hypertension   . Nonspecific abnormal electrocardiogram (ECG) (EKG)   . Stricture and stenosis of esophagus   .  Esophageal reflux   . Diverticulosis of colon (without mention of hemorrhage)   . Benign neoplasm of colon   . Macular degeneration   . Osteoarthritis     osteroperosis  . Other and unspecified hyperlipidemia   . HTN (hypertension)   . Shingles   . Cystocele   . Gastroparesis   . Skin cancer (melanoma)   . Hypothyroidism   . Cancer     pre-melanoma  . Hemorrhoids    Past Surgical History  Procedure Laterality Date  . Cholecystectomy    .  Gallbladder surgery  2002  . Hemorrhoid surgery    . Appendectomy    . Thyroidectomy    . Left eye surgery    . Cataract extraction, bilateral    . Melenoma resection      right arm  . Bcc resection      legs/face  . Tonsillectomy    . Flexible sigmoidoscopy  05/12/2011    Procedure: FLEXIBLE SIGMOIDOSCOPY;  Surgeon: Inda Castle, MD;  Location: [facility];  Service: Endoscopy;  Laterality: N/A;  . Flexible sigmoidoscopy  09/10/2011    Procedure: FLEXIBLE SIGMOIDOSCOPY;  Surgeon: Inda Castle, MD;  Location: [facility];  Service: Endoscopy;  Laterality: N/A;  . Fracture surgery      left hip pin  . Flexible sigmoidoscopy N/A 08/16/2012    Procedure: FLEXIBLE SIGMOIDOSCOPY;  Surgeon: Inda Castle, MD;  Location: [facility];  Service: Endoscopy;  Laterality: N/A;  . Hemorrhoid banding N/A 08/16/2012    Procedure: HEMORRHOID BANDING;  Surgeon: Inda Castle, MD;  Location: [facility];  Service: Endoscopy;  Laterality: N/A;  . Total hip arthroplasty Right 03/27/2014    Procedure: TOTAL HIP ARTHROPLASTY ANTERIOR APPROACH;  Surgeon: Alta Corning, MD;  Location: [facility];  Service: Orthopedics;  Laterality: Right;   Social History:   reports that she has never smoked. She has never used smokeless tobacco. She reports that she does not drink alcohol or use illicit drugs.  Family History  Problem Relation Age of Onset  . Esophageal cancer Brother   . Heart disease Father   . Hypertension Father   . Heart attack Father   . Colon cancer Neg Hx   . Malignant hyperthermia Neg Hx   . Diabetes Daughter     Medications: Patient's Medications  New Prescriptions   No medications on file  Previous Medications   ASPIRIN EC 325 MG TABLET    Take 1 tablet (325 mg total) by mouth 2 (two) times daily after a meal.   CALCIUM CARBONATE (CAL-GEST ANTACID) 500 MG CHEWABLE TABLET    Chew 1 tablet by mouth as needed.    CHOLECALCIFEROL (VITAMIN D) 2000 UNITS TABLET    Take 4,000 Units by  mouth daily.   DOCUSATE SODIUM (COLACE) 100 MG CAPSULE    Take 100 mg by mouth 2 (two) times daily as needed.    FERROUS SULFATE 325 (65 FE) MG TABLET    Take 1 tablet (325 mg total) by mouth 2 (two) times daily with a meal.   LABETALOL (NORMODYNE) 100 MG TABLET    TAKE 1 TABLET BY MOUTH TWICE A DAY FOR BP   LEVOTHYROXINE (SYNTHROID) 100 MCG TABLET    Take 1 tablet (100 mcg total) by mouth daily.   METHOCARBAMOL (ROBAXIN) 500 MG TABLET    Take 1 tablet (500 mg total) by mouth every 8 (eight) hours as needed for muscle spasms.   MUPIROCIN OINTMENT (BACTROBAN) 2 %  daily.   OMEPRAZOLE (PRILOSEC) 40 MG CAPSULE    TAKE ONE CAPSULE BY MOUTH EVERY DAY FOR ACID REFLUX   OXYCODONE-ACETAMINOPHEN (PERCOCET/ROXICET) 5-325 MG PER TABLET    Take 1 tablet by mouth every 8 (eight) hours as needed for severe pain.   POLYETHYLENE GLYCOL (MIRALAX / GLYCOLAX) PACKET    Take 17 g by mouth as needed for mild constipation.    TRAMADOL (ULTRAM) 50 MG TABLET    Take 1 tablet (50 mg total) by mouth every 6 (six) hours as needed.   ZETIA 10 MG TABLET    Take 10 mg by mouth daily.  Modified Medications   No medications on file  Discontinued Medications   No medications on file     Physical Exam: Physical Exam  Constitutional: She is oriented to person, place, and time. She appears well-developed and well-nourished. No distress.  HENT:  Head: Normocephalic and atraumatic.  Right Ear: External ear normal.  Left Ear: External ear normal.  Nose: Nose normal.  Mouth/Throat: Oropharynx is clear and moist. No oropharyngeal exudate.  Eyes: Conjunctivae and EOM are normal. Pupils are equal, round, and reactive to light. Right eye exhibits no discharge. Left eye exhibits no discharge. No scleral icterus.  Neck: Normal range of motion. Neck supple. No JVD present. No tracheal deviation present. No thyromegaly present.  Cardiovascular: Normal rate, regular rhythm, normal heart sounds and intact distal pulses.   No murmur  heard. Pulmonary/Chest: Effort normal and breath sounds normal. No stridor. No respiratory distress. She has no wheezes. She has no rales. She exhibits no tenderness.  Abdominal: Soft. Bowel sounds are normal. She exhibits no distension and no mass. There is no tenderness. There is no rebound and no guarding.  Musculoskeletal: Normal range of motion. She exhibits edema and tenderness.  Right hip pain with ROM. 1+ edema R leg  Lymphadenopathy:    She has no cervical adenopathy.  Neurological: She is alert and oriented to person, place, and time. She has normal reflexes. She displays normal reflexes. No cranial nerve deficit. She exhibits normal muscle tone. Coordination normal.  Skin: Skin is dry. No rash noted. She is not diaphoretic. There is pallor.  Right hip surgical incision intact. Mild erythema peri-wound. R leg mild swelling.   Psychiatric: She has a normal mood and affect. Her behavior is normal. Judgment and thought content normal.    Filed Vitals:   04/03/14 1518  BP: 114/90  Pulse: 98  Temp: 98.1 F (36.7 C)  TempSrc: Tympanic  Resp: 20      Labs reviewed: Basic Metabolic Panel:  Recent Labs  05/19/13 1034  10/06/13 1516 11/09/13 1448 12/27/13 1428 02/22/14 1100 03/20/14 1300 03/28/14 0458 03/29/14 1040  NA 137  < > 133*  --   --  139 139 138 132*  K 4.7  < > 4.9  --   --  3.7 3.3* 2.7* 3.5*  CL 101  < > 100  --   --  98 95* 97 96  CO2 27  < > 29  --   --  28 27 22 23   GLUCOSE 95  < > 105*  --   --  79 100* 103* 104*  BUN 17  < > 24*  --   --  16 19 14 14   CREATININE 0.71  < > 0.82  --   --  0.80 0.77 0.56 0.71  CALCIUM 9.3  < > 9.3  --   --  9.7 9.5 7.9* 8.4  MG 1.8  --  2.0  --   --  1.9  --   --   --   TSH 5.899*  < > 5.076* 5.003* 0.760 0.443  --   --   --   < > = values in this interval not displayed. Liver Function Tests:  Recent Labs  10/06/13 1516 02/22/14 1100 03/20/14 1300  AST 14 18 18   ALT 9 15 12   ALKPHOS 50 70 71  BILITOT 0.4 0.6  0.3  PROT 6.3 6.2 6.5  ALBUMIN 4.2 4.0 3.7   No results for input(s): LIPASE, AMYLASE in the last 8760 hours. No results for input(s): AMMONIA in the last 8760 hours. CBC:  Recent Labs  10/06/13 1516 02/22/14 1100 03/20/14 1300 03/28/14 0458 03/29/14 0644 03/30/14 0535  WBC 7.4 11.9* 9.6 7.5 10.4 8.4  NEUTROABS 4.8 9.0* 6.9  --   --   --   HGB 11.8* 12.3 11.9* 8.1* 7.9* 7.3*  HCT 35.4* 36.3 37.0 24.9* 23.3* 22.7*  MCV 81.6 81.4 83.7 83.6 84.1 82.2  PLT 346 390 373 217 203 234   Lipid Panel:  Recent Labs  05/19/13 1034 10/06/13 1516 02/22/14 1100  CHOL 213* 218* 243*  HDL 90 93 98  LDLCALC 104* 108* 120*  TRIG 95 84 124  CHOLHDL 2.4 2.3 2.5    Past Procedures:  03/27/14 X-ray Pelvis:  IMPRESSION: Total hip prosthesis on the right which is well seated. Posttraumatic change on the left. Moderate narrowing left hip joint. No acute fracture or dislocation.  Assessment/Plan Right hip pain IMPRESSION: Total hip prosthesis on the right which is well seated. Posttraumatic change on the left. Moderate narrowing left hip joint. No acute fracture or dislocation. 04/03/14 will schedule Oxycodone/ApAp 5/325 8am +8pm, Tramadol 50mg  1pm in addition to prn. Continue PT/OT. Continue ASA 325mg  bid for post op DVT/PE risk reduction.   Constipation No BM for several days. Will MiraLax 17gm q2h until BM then Senna S II qhs.   Acute blood loss anemia Post op, continue po Fe, Hgb 7.3 03/30/14, update CBC in am  GERD Asymptomatic, continue Omeprazole 40mg  daily.   Essential hypertension Controlled, takes Labetalol 100mg  bid.   Hypothyroidism Takes Levothyroxine 170mcg po daily. Update TSH and CMP  Hypokalemia K 3.5 03/30/14, update CMP  Vitamin D deficiency Continue to take Vit D supplement.     Family/ Staff Communication: observe the patient  Goals of Care: [place]  Labs/tests ordered: CBC, CMP, TSH

## 2014-04-03 NOTE — Assessment & Plan Note (Addendum)
IMPRESSION: Total hip prosthesis on the right which is well seated. Posttraumatic change on the left. Moderate narrowing left hip joint. No acute fracture or dislocation. 04/03/14 will schedule Oxycodone/ApAp 5/325 8am +8pm, Tramadol 50mg  1pm in addition to prn. Continue PT/OT. Continue ASA 325mg  bid for post op DVT/PE risk reduction.

## 2014-04-03 NOTE — Assessment & Plan Note (Signed)
K 3.5 03/30/14, update CMP

## 2014-04-03 NOTE — Assessment & Plan Note (Signed)
No BM for several days. Will MiraLax 17gm q2h until BM then Senna S II qhs.

## 2014-04-04 LAB — BASIC METABOLIC PANEL
BUN: 13 mg/dL (ref 4–21)
Creatinine: 0.6 mg/dL (ref 0.5–1.1)
Glucose: 89 mg/dL
Potassium: 5 mmol/L (ref 3.4–5.3)
Sodium: 137 mmol/L (ref 137–147)

## 2014-04-04 LAB — CBC AND DIFFERENTIAL
HEMATOCRIT: 26 % — AB (ref 36–46)
Hemoglobin: 8.5 g/dL — AB (ref 12.0–16.0)
Platelets: 597 10*3/uL — AB (ref 150–399)
WBC: 12.1 10^3/mL

## 2014-04-04 LAB — TSH: TSH: 1.55 u[IU]/mL (ref 0.41–5.90)

## 2014-04-04 LAB — HEPATIC FUNCTION PANEL
ALK PHOS: 69 U/L (ref 25–125)
ALT: 13 U/L (ref 7–35)
AST: 21 U/L (ref 13–35)
Bilirubin, Total: 0.8 mg/dL

## 2014-04-10 ENCOUNTER — Other Ambulatory Visit: Payer: Self-pay | Admitting: Nurse Practitioner

## 2014-04-10 DIAGNOSIS — D62 Acute posthemorrhagic anemia: Secondary | ICD-10-CM

## 2014-04-10 DIAGNOSIS — E876 Hypokalemia: Secondary | ICD-10-CM

## 2014-04-12 ENCOUNTER — Encounter: Payer: Self-pay | Admitting: Nurse Practitioner

## 2014-04-12 DIAGNOSIS — M9701XA Periprosthetic fracture around internal prosthetic right hip joint, initial encounter: Secondary | ICD-10-CM | POA: Insufficient documentation

## 2014-05-01 ENCOUNTER — Non-Acute Institutional Stay (SKILLED_NURSING_FACILITY): Payer: Medicare Other | Admitting: Nurse Practitioner

## 2014-05-01 DIAGNOSIS — E039 Hypothyroidism, unspecified: Secondary | ICD-10-CM

## 2014-05-01 DIAGNOSIS — K5900 Constipation, unspecified: Secondary | ICD-10-CM

## 2014-05-01 DIAGNOSIS — M25551 Pain in right hip: Secondary | ICD-10-CM

## 2014-05-01 DIAGNOSIS — I10 Essential (primary) hypertension: Secondary | ICD-10-CM

## 2014-05-01 DIAGNOSIS — E876 Hypokalemia: Secondary | ICD-10-CM

## 2014-05-01 DIAGNOSIS — K219 Gastro-esophageal reflux disease without esophagitis: Secondary | ICD-10-CM

## 2014-05-01 DIAGNOSIS — D62 Acute posthemorrhagic anemia: Secondary | ICD-10-CM

## 2014-05-01 NOTE — Assessment & Plan Note (Addendum)
Takes Levothyroxine 138mcg po daily.04/04/14 TSH 1.55

## 2014-05-01 NOTE — Assessment & Plan Note (Signed)
Stable, continue Senna S II qhs.

## 2014-05-01 NOTE — Progress Notes (Signed)
Patient ID: Crystal Petersen, female   DOB: [birth year], 81 y.o.   MRN: [id number]   Code Status: DNR  Allergies  Allergen Reactions  . Ace Inhibitors     Elevated potassium  . Fosamax [Alendronate Sodium]   . Metoclopramide Hcl Other (See Comments)    Pt. Not sure  . Reglan [Metoclopramide]   . Statins Other (See Comments)    myalgia  . Sulfa Antibiotics   . Vesicare [Solifenacin]   . Vicodin [Hydrocodone-Acetaminophen] Other (See Comments)    "felt spaced out"  . Sulfonamide Derivatives Rash    Chief Complaint  Patient presents with  . Medical Management of Chronic Issues  . Acute Visit    worsened R hip pain.     HPI: Patient is a 81 y.o. female seen in the SNF at [facility] today for evaluation of R hip pain, constipation,  and other chronic medical conditions.  Problem List Items Addressed This Visit    Right hip pain - Primary    IMPRESSION: Total hip prosthesis on the right which is well seated. Posttraumatic change on the left. Moderate narrowing left hip joint. No acute fracture or dislocation. 04/25/14 Ortho: WBAT, ambulation, no abduction exercise.  04/28/14 X-ray R hip/pelvis and lumbar spine: L3 90% compression fx indeterminatined dating. TRH in place. A question of fx involving the proximal portion of the femoral attachment of the prosthesis f/u Ortho.         Hypothyroidism    Takes Levothyroxine 177mcg po daily.04/04/14 TSH 1.55      Hypokalemia    K 3.5 03/30/14 and 5.0 04/04/14,  update CMP      GERD    Asymptomatic, continue Omeprazole 40mg  daily.        Essential hypertension    Controlled, takes Labetalol 100mg  bid.        Constipation    Stable, continue Senna S II qhs.        Acute blood loss anemia    Post op, continue po Fe, Hgb 7.3 03/30/14 and 8.5 04/04/14. Update CBC          Review of Systems:  Review of Systems  Constitutional: Negative for fever, chills, weight loss, malaise/fatigue and diaphoresis.    HENT: Positive for hearing loss. Negative for congestion, ear discharge, ear pain, nosebleeds, sore throat and tinnitus.   Eyes: Negative for blurred vision, double vision, photophobia, pain, discharge and redness.  Respiratory: Negative for cough, hemoptysis, sputum production, shortness of breath, wheezing and stridor.   Cardiovascular: Positive for leg swelling. Negative for chest pain, palpitations, orthopnea, claudication and PND.       RLE trace  Gastrointestinal: Negative for heartburn, nausea, vomiting, abdominal pain, diarrhea, constipation, blood in stool and melena.  Genitourinary: Negative for dysuria, urgency, frequency, hematuria and flank pain.  Musculoskeletal: Positive for joint pain. Negative for myalgias, back pain, falls and neck pain.       R hip pain.   Skin: Negative for itching and rash.  Neurological: Negative for dizziness, tingling, tremors, sensory change, speech change, focal weakness, seizures, loss of consciousness, weakness and headaches.  Endo/Heme/Allergies: Negative for environmental allergies and polydipsia. Bruises/bleeds easily.  Psychiatric/Behavioral: Positive for memory loss. Negative for depression, suicidal ideas, hallucinations and substance abuse. The patient is not nervous/anxious and does not have insomnia.      Past Medical History  Diagnosis Date  . Hypertension   . Nonspecific abnormal electrocardiogram (ECG) (EKG)   . Stricture and stenosis of esophagus   .  Esophageal reflux   . Diverticulosis of colon (without mention of hemorrhage)   . Benign neoplasm of colon   . Macular degeneration   . Osteoarthritis     osteroperosis  . Other and unspecified hyperlipidemia   . HTN (hypertension)   . Shingles   . Cystocele   . Gastroparesis   . Skin cancer (melanoma)   . Hypothyroidism   . Cancer     pre-melanoma  . Hemorrhoids    Past Surgical History  Procedure Laterality Date  . Cholecystectomy    . Gallbladder surgery  2002  .  Hemorrhoid surgery    . Appendectomy    . Thyroidectomy    . Left eye surgery    . Cataract extraction, bilateral    . Melenoma resection      right arm  . Bcc resection      legs/face  . Tonsillectomy    . Flexible sigmoidoscopy  05/12/2011    Procedure: FLEXIBLE SIGMOIDOSCOPY;  Surgeon: Inda Castle, MD;  Location: [facility];  Service: Endoscopy;  Laterality: N/A;  . Flexible sigmoidoscopy  09/10/2011    Procedure: FLEXIBLE SIGMOIDOSCOPY;  Surgeon: Inda Castle, MD;  Location: [facility];  Service: Endoscopy;  Laterality: N/A;  . Fracture surgery      left hip pin  . Flexible sigmoidoscopy N/A 08/16/2012    Procedure: FLEXIBLE SIGMOIDOSCOPY;  Surgeon: Inda Castle, MD;  Location: [facility];  Service: Endoscopy;  Laterality: N/A;  . Hemorrhoid banding N/A 08/16/2012    Procedure: HEMORRHOID BANDING;  Surgeon: Inda Castle, MD;  Location: [facility];  Service: Endoscopy;  Laterality: N/A;  . Total hip arthroplasty Right 03/27/2014    Procedure: TOTAL HIP ARTHROPLASTY ANTERIOR APPROACH;  Surgeon: Alta Corning, MD;  Location: [facility];  Service: Orthopedics;  Laterality: Right;   Social History:   reports that she has never smoked. She has never used smokeless tobacco. She reports that she does not drink alcohol or use illicit drugs.  Family History  Problem Relation Age of Onset  . Esophageal cancer Brother   . Heart disease Father   . Hypertension Father   . Heart attack Father   . Colon cancer Neg Hx   . Malignant hyperthermia Neg Hx   . Diabetes Daughter     Medications: Patient's Medications  New Prescriptions   No medications on file  Previous Medications   ASPIRIN EC 325 MG TABLET    Take 1 tablet (325 mg total) by mouth 2 (two) times daily after a meal.   CALCIUM CARBONATE (CAL-GEST ANTACID) 500 MG CHEWABLE TABLET    Chew 1 tablet by mouth as needed.    CHOLECALCIFEROL (VITAMIN D) 2000 UNITS TABLET    Take 4,000 Units by mouth daily.   DOCUSATE SODIUM  (COLACE) 100 MG CAPSULE    Take 100 mg by mouth 2 (two) times daily as needed.    FERROUS SULFATE 325 (65 FE) MG TABLET    Take 1 tablet (325 mg total) by mouth 2 (two) times daily with a meal.   LABETALOL (NORMODYNE) 100 MG TABLET    TAKE 1 TABLET BY MOUTH TWICE A DAY FOR BP   LEVOTHYROXINE (SYNTHROID) 100 MCG TABLET    Take 1 tablet (100 mcg total) by mouth daily.   METHOCARBAMOL (ROBAXIN) 500 MG TABLET    Take 1 tablet (500 mg total) by mouth every 8 (eight) hours as needed for muscle spasms.   MUPIROCIN OINTMENT (BACTROBAN) 2 %  daily.   OMEPRAZOLE (PRILOSEC) 40 MG CAPSULE    TAKE ONE CAPSULE BY MOUTH EVERY DAY FOR ACID REFLUX   OXYCODONE-ACETAMINOPHEN (PERCOCET/ROXICET) 5-325 MG PER TABLET    Take 1 tablet by mouth every 8 (eight) hours as needed for severe pain.   POLYETHYLENE GLYCOL (MIRALAX / GLYCOLAX) PACKET    Take 17 g by mouth as needed for mild constipation.    TRAMADOL (ULTRAM) 50 MG TABLET    Take 1 tablet (50 mg total) by mouth every 6 (six) hours as needed.   ZETIA 10 MG TABLET    Take 10 mg by mouth daily.  Modified Medications   No medications on file  Discontinued Medications   No medications on file     Physical Exam: Physical Exam  Constitutional: She is oriented to person, place, and time. She appears well-developed and well-nourished. No distress.  HENT:  Head: Normocephalic and atraumatic.  Right Ear: External ear normal.  Left Ear: External ear normal.  Nose: Nose normal.  Mouth/Throat: Oropharynx is clear and moist. No oropharyngeal exudate.  Eyes: Conjunctivae and EOM are normal. Pupils are equal, round, and reactive to light. Right eye exhibits no discharge. Left eye exhibits no discharge. No scleral icterus.  Neck: Normal range of motion. Neck supple. No JVD present. No tracheal deviation present. No thyromegaly present.  Cardiovascular: Normal rate, regular rhythm, normal heart sounds and intact distal pulses.   No murmur heard. Pulmonary/Chest: Effort  normal and breath sounds normal. No stridor. No respiratory distress. She has no wheezes. She has no rales. She exhibits no tenderness.  Abdominal: Soft. Bowel sounds are normal. She exhibits no distension and no mass. There is no tenderness. There is no rebound and no guarding.  Musculoskeletal: Normal range of motion. She exhibits edema and tenderness.  Right hip pain with ROM. Trace edema R leg  Lymphadenopathy:    She has no cervical adenopathy.  Neurological: She is alert and oriented to person, place, and time. She has normal reflexes. No cranial nerve deficit. She exhibits normal muscle tone. Coordination normal.  Skin: Skin is dry. No rash noted. She is not diaphoretic. There is pallor.  Right hip surgical incision healed. R leg mild swelling.   Psychiatric: She has a normal mood and affect. Her behavior is normal. Judgment and thought content normal.    Filed Vitals:   05/01/14 1619  BP: 150/90  Pulse: 72  Temp: 98.3 F (36.8 C)  TempSrc: Tympanic  Resp: 18      Labs reviewed: Basic Metabolic Panel:  Recent Labs  05/19/13 1034  10/06/13 1516  12/27/13 1428 02/22/14 1100  03/20/14 1300 03/28/14 0458 03/29/14 1040 04/04/14  NA 137  < > 133*  --   --  139  --  139 138 132* 137  K 4.7  < > 4.9  --   --  3.7  --  3.3* 2.7* 3.5* 5.0  CL 101  < > 100  --   --  98  --  95* 97 96  --   CO2 27  < > 29  --   --  28  --  27 22 23   --   GLUCOSE 95  < > 105*  --   --  79  --  100* 103* 104*  --   BUN 17  < > 24*  --   --  16  --  19 14 14 13   CREATININE 0.71  < > 0.82  --   --  0.80  < > 0.77 0.56 0.71 0.6  CALCIUM 9.3  < > 9.3  --   --  9.7  --  9.5 7.9* 8.4  --   MG 1.8  --  2.0  --   --  1.9  --   --   --   --   --   TSH 5.899*  < > 5.076*  < > 0.760 0.443  --   --   --   --  1.55  < > = values in this interval not displayed. Liver Function Tests:  Recent Labs  10/06/13 1516 02/22/14 1100 03/20/14 1300 04/04/14  AST 14 18 18 21   ALT 9 15 12 13   ALKPHOS 50 70 71  69  BILITOT 0.4 0.6 0.3  --   PROT 6.3 6.2 6.5  --   ALBUMIN 4.2 4.0 3.7  --    No results for input(s): LIPASE, AMYLASE in the last 8760 hours. No results for input(s): AMMONIA in the last 8760 hours. CBC:  Recent Labs  10/06/13 1516 02/22/14 1100 03/20/14 1300 03/28/14 0458 03/29/14 0644 03/30/14 0535 04/04/14  WBC 7.4 11.9* 9.6 7.5 10.4 8.4 12.1  NEUTROABS 4.8 9.0* 6.9  --   --   --   --   HGB 11.8* 12.3 11.9* 8.1* 7.9* 7.3* 8.5*  HCT 35.4* 36.3 37.0 24.9* 23.3* 22.7* 26*  MCV 81.6 81.4 83.7 83.6 84.1 82.2  --   PLT 346 390 373 217 203 234 597*   Lipid Panel:  Recent Labs  05/19/13 1034 10/06/13 1516 02/22/14 1100  CHOL 213* 218* 243*  HDL 90 93 98  LDLCALC 104* 108* 120*  TRIG 95 84 124  CHOLHDL 2.4 2.3 2.5    Past Procedures:  03/27/14 X-ray Pelvis:  IMPRESSION: Total hip prosthesis on the right which is well seated. Posttraumatic change on the left. Moderate narrowing left hip joint. No acute fracture or dislocation.  Assessment/Plan Right hip pain IMPRESSION: Total hip prosthesis on the right which is well seated. Posttraumatic change on the left. Moderate narrowing left hip joint. No acute fracture or dislocation. 04/25/14 Ortho: WBAT, ambulation, no abduction exercise.  04/28/14 X-ray R hip/pelvis and lumbar spine: L3 90% compression fx indeterminatined dating. TRH in place. A question of fx involving the proximal portion of the femoral attachment of the prosthesis f/u Ortho.      Acute blood loss anemia Post op, continue po Fe, Hgb 7.3 03/30/14 and 8.5 04/04/14. Update CBC    Constipation Stable, continue Senna S II qhs.     Essential hypertension Controlled, takes Labetalol 100mg  bid.     GERD Asymptomatic, continue Omeprazole 40mg  daily.     Hypokalemia K 3.5 03/30/14 and 5.0 04/04/14,  update CMP   Hypothyroidism Takes Levothyroxine 168mcg po daily.04/04/14 TSH 1.55     Family/ Staff Communication: observe the  patient  Goals of Care: [place]  Labs/tests ordered: CBC, CMP

## 2014-05-01 NOTE — Assessment & Plan Note (Signed)
K 3.5 03/30/14 and 5.0 04/04/14,  update CMP

## 2014-05-01 NOTE — Assessment & Plan Note (Signed)
IMPRESSION: Total hip prosthesis on the right which is well seated. Posttraumatic change on the left. Moderate narrowing left hip joint. No acute fracture or dislocation. 04/25/14 Ortho: WBAT, ambulation, no abduction exercise.  04/28/14 X-ray R hip/pelvis and lumbar spine: L3 90% compression fx indeterminatined dating. TRH in place. A question of fx involving the proximal portion of the femoral attachment of the prosthesis f/u Ortho.

## 2014-05-01 NOTE — Assessment & Plan Note (Signed)
Asymptomatic, continue Omeprazole 40mg  daily.

## 2014-05-01 NOTE — Assessment & Plan Note (Signed)
Post op, continue po Fe, Hgb 7.3 03/30/14 and 8.5 04/04/14. Update CBC

## 2014-05-01 NOTE — Assessment & Plan Note (Signed)
Controlled, takes Labetalol 100mg  bid.

## 2014-05-04 LAB — BASIC METABOLIC PANEL
BUN: 16 mg/dL (ref 4–21)
Creatinine: 0.6 mg/dL (ref 0.5–1.1)
Glucose: 89 mg/dL
Potassium: 3 mmol/L — AB (ref 3.4–5.3)
Sodium: 139 mmol/L (ref 137–147)

## 2014-05-04 LAB — CBC AND DIFFERENTIAL
HEMATOCRIT: 35 % — AB (ref 36–46)
Hemoglobin: 10.8 g/dL — AB (ref 12.0–16.0)
Platelets: 278 10*3/uL (ref 150–399)
WBC: 5.7 10^3/mL

## 2014-05-04 LAB — HEPATIC FUNCTION PANEL
ALT: 16 U/L (ref 7–35)
AST: 21 U/L (ref 13–35)
Alkaline Phosphatase: 52 U/L (ref 25–125)
Bilirubin, Total: 0.2 mg/dL

## 2014-05-08 ENCOUNTER — Non-Acute Institutional Stay (SKILLED_NURSING_FACILITY): Payer: Medicare Other | Admitting: Nurse Practitioner

## 2014-05-08 ENCOUNTER — Other Ambulatory Visit: Payer: Self-pay | Admitting: Nurse Practitioner

## 2014-05-08 DIAGNOSIS — E876 Hypokalemia: Secondary | ICD-10-CM

## 2014-05-08 DIAGNOSIS — K219 Gastro-esophageal reflux disease without esophagitis: Secondary | ICD-10-CM

## 2014-05-08 DIAGNOSIS — K5900 Constipation, unspecified: Secondary | ICD-10-CM

## 2014-05-08 DIAGNOSIS — M25551 Pain in right hip: Secondary | ICD-10-CM

## 2014-05-08 DIAGNOSIS — D62 Acute posthemorrhagic anemia: Secondary | ICD-10-CM

## 2014-05-08 DIAGNOSIS — I10 Essential (primary) hypertension: Secondary | ICD-10-CM

## 2014-05-08 DIAGNOSIS — E039 Hypothyroidism, unspecified: Secondary | ICD-10-CM

## 2014-05-08 NOTE — Assessment & Plan Note (Signed)
Controlled, takes Labetalol 100mg  bid.

## 2014-05-08 NOTE — Assessment & Plan Note (Signed)
Stable, continue Senna S II qhs.

## 2014-05-08 NOTE — Progress Notes (Signed)
Patient ID: Crystal Petersen, female   DOB: 1922-03-30, 81 y.o.   MRN: [id number]   Code Status: DNR  Allergies  Allergen Reactions  . Ace Inhibitors     Elevated potassium  . Fosamax [Alendronate Sodium]   . Metoclopramide Hcl Other (See Comments)    Pt. Not sure  . Reglan [Metoclopramide]   . Statins Other (See Comments)    myalgia  . Sulfa Antibiotics   . Vesicare [Solifenacin]   . Vicodin [Hydrocodone-Acetaminophen] Other (See Comments)    "felt spaced out"  . Sulfonamide Derivatives Rash    Chief Complaint  Patient presents with  . Medical Management of Chronic Issues  . Acute Visit    K 3.0    HPI: Patient is a 81 y.o. female seen in the SNF at [facility] today for evaluation of K 3.0  and other chronic medical conditions.  Problem List Items Addressed This Visit    Right hip pain    IMPRESSION: Total hip prosthesis on the right which is well seated. Posttraumatic change on the left. Moderate narrowing left hip joint. No acute fracture or dislocation. 04/25/14 Ortho: WBAT, ambulation, no abduction exercise.  04/28/14 X-ray R hip/pelvis and lumbar spine: L3 90% compression fx indeterminatined dating. TRH in place. A question of fx involving the proximal portion of the femoral attachment of the prosthesis f/u Ortho.         Hypothyroidism    Takes Levothyroxine 133mcg po daily.04/04/14 TSH 1.55       Hypokalemia - Primary    04/04/14 K 5.0 05/04/14 K 3.0 05/08/14 Kcl 12meq daily. BMP        GERD    Asymptomatic, continue Omeprazole 40mg  daily.        Essential hypertension    Controlled, takes Labetalol 100mg  bid.        Constipation    Stable, continue Senna S II qhs.         Acute blood loss anemia    03/30/14 Hgb 7.3 04/04/14 Hgb 8.5 continue Fe bid.  05/04/14 Hgb 10.8 05/08/14 continue Fe bid.           Review of Systems:  Review of Systems  Constitutional: Negative for fever, chills, weight loss, malaise/fatigue and  diaphoresis.  HENT: Positive for hearing loss. Negative for congestion, ear discharge, ear pain, nosebleeds, sore throat and tinnitus.   Eyes: Negative for blurred vision, double vision, photophobia, pain, discharge and redness.  Respiratory: Negative for cough, hemoptysis, sputum production, shortness of breath, wheezing and stridor.   Cardiovascular: Positive for leg swelling. Negative for chest pain, palpitations, orthopnea, claudication and PND.       Trace RLE  Gastrointestinal: Negative for heartburn, nausea, vomiting, abdominal pain, diarrhea, constipation, blood in stool and melena.  Genitourinary: Positive for frequency. Negative for dysuria, urgency, hematuria and flank pain.  Musculoskeletal: Positive for joint pain. Negative for myalgias, back pain, falls and neck pain.       R hip  Skin: Negative for itching and rash.  Neurological: Negative for dizziness, tingling, tremors, sensory change, speech change, focal weakness, seizures, loss of consciousness, weakness and headaches.  Endo/Heme/Allergies: Negative for environmental allergies and polydipsia. Does not bruise/bleed easily.  Psychiatric/Behavioral: Positive for memory loss. Negative for depression, suicidal ideas, hallucinations and substance abuse. The patient is not nervous/anxious and does not have insomnia.      Past Medical History  Diagnosis Date  . Hypertension   . Nonspecific abnormal electrocardiogram (ECG) (EKG)   .  Stricture and stenosis of esophagus   . Esophageal reflux   . Diverticulosis of colon (without mention of hemorrhage)   . Benign neoplasm of colon   . Macular degeneration   . Osteoarthritis     osteroperosis  . Other and unspecified hyperlipidemia   . HTN (hypertension)   . Shingles   . Cystocele   . Gastroparesis   . Skin cancer (melanoma)   . Hypothyroidism   . Cancer     pre-melanoma  . Hemorrhoids    Past Surgical History  Procedure Laterality Date  . Cholecystectomy    .  Gallbladder surgery  2002  . Hemorrhoid surgery    . Appendectomy    . Thyroidectomy    . Left eye surgery    . Cataract extraction, bilateral    . Melenoma resection      right arm  . Bcc resection      legs/face  . Tonsillectomy    . Flexible sigmoidoscopy  05/12/2011    Procedure: FLEXIBLE SIGMOIDOSCOPY;  Surgeon: Inda Castle, MD;  Location: [facility];  Service: Endoscopy;  Laterality: N/A;  . Flexible sigmoidoscopy  09/10/2011    Procedure: FLEXIBLE SIGMOIDOSCOPY;  Surgeon: Inda Castle, MD;  Location: [facility];  Service: Endoscopy;  Laterality: N/A;  . Fracture surgery      left hip pin  . Flexible sigmoidoscopy N/A 08/16/2012    Procedure: FLEXIBLE SIGMOIDOSCOPY;  Surgeon: Inda Castle, MD;  Location: [facility];  Service: Endoscopy;  Laterality: N/A;  . Hemorrhoid banding N/A 08/16/2012    Procedure: HEMORRHOID BANDING;  Surgeon: Inda Castle, MD;  Location: [facility];  Service: Endoscopy;  Laterality: N/A;  . Total hip arthroplasty Right 03/27/2014    Procedure: TOTAL HIP ARTHROPLASTY ANTERIOR APPROACH;  Surgeon: Alta Corning, MD;  Location: [facility];  Service: Orthopedics;  Laterality: Right;   Social History:   reports that she has never smoked. She has never used smokeless tobacco. She reports that she does not drink alcohol or use illicit drugs.  Family History  Problem Relation Age of Onset  . Esophageal cancer Brother   . Heart disease Father   . Hypertension Father   . Heart attack Father   . Colon cancer Neg Hx   . Malignant hyperthermia Neg Hx   . Diabetes Daughter     Medications: Patient's Medications  New Prescriptions   No medications on file  Previous Medications   ASPIRIN EC 325 MG TABLET    Take 1 tablet (325 mg total) by mouth 2 (two) times daily after a meal.   CALCIUM CARBONATE (CAL-GEST ANTACID) 500 MG CHEWABLE TABLET    Chew 1 tablet by mouth as needed.    CHOLECALCIFEROL (VITAMIN D) 2000 UNITS TABLET    Take 4,000 Units by  mouth daily.   DOCUSATE SODIUM (COLACE) 100 MG CAPSULE    Take 100 mg by mouth 2 (two) times daily as needed.    FERROUS SULFATE 325 (65 FE) MG TABLET    Take 1 tablet (325 mg total) by mouth 2 (two) times daily with a meal.   LABETALOL (NORMODYNE) 100 MG TABLET    TAKE 1 TABLET BY MOUTH TWICE A DAY FOR BP   LEVOTHYROXINE (SYNTHROID) 100 MCG TABLET    Take 1 tablet (100 mcg total) by mouth daily.   METHOCARBAMOL (ROBAXIN) 500 MG TABLET    Take 1 tablet (500 mg total) by mouth every 8 (eight) hours as needed for muscle spasms.  MUPIROCIN OINTMENT (BACTROBAN) 2 %    daily.   OMEPRAZOLE (PRILOSEC) 40 MG CAPSULE    TAKE ONE CAPSULE BY MOUTH EVERY DAY FOR ACID REFLUX   OXYCODONE-ACETAMINOPHEN (PERCOCET/ROXICET) 5-325 MG PER TABLET    Take 1 tablet by mouth every 8 (eight) hours as needed for severe pain.   POLYETHYLENE GLYCOL (MIRALAX / GLYCOLAX) PACKET    Take 17 g by mouth as needed for mild constipation.    POTASSIUM CHLORIDE SA (K-DUR,KLOR-CON) 20 MEQ TABLET    Take 20 mEq by mouth daily.   TRAMADOL (ULTRAM) 50 MG TABLET    Take 1 tablet (50 mg total) by mouth every 6 (six) hours as needed.   ZETIA 10 MG TABLET    Take 10 mg by mouth daily.  Modified Medications   No medications on file  Discontinued Medications   No medications on file     Physical Exam: Physical Exam  Constitutional: She is oriented to person, place, and time. She appears well-developed and well-nourished. No distress.  HENT:  Head: Normocephalic and atraumatic.  Right Ear: External ear normal.  Left Ear: External ear normal.  Nose: Nose normal.  Mouth/Throat: Oropharynx is clear and moist. No oropharyngeal exudate.  Eyes: Conjunctivae and EOM are normal. Pupils are equal, round, and reactive to light. Right eye exhibits no discharge. Left eye exhibits no discharge. No scleral icterus.  Neck: Normal range of motion. Neck supple. No JVD present. No tracheal deviation present. No thyromegaly present.  Cardiovascular:  Normal rate, regular rhythm, normal heart sounds and intact distal pulses.   No murmur heard. Pulmonary/Chest: Effort normal and breath sounds normal. No stridor. No respiratory distress. She has no wheezes. She has no rales. She exhibits no tenderness.  Abdominal: Soft. Bowel sounds are normal. She exhibits no distension and no mass. There is no tenderness. There is no rebound and no guarding.  Musculoskeletal: Normal range of motion. She exhibits edema and tenderness.  Right hip pain with ROM. Trace edema R leg  Lymphadenopathy:    She has no cervical adenopathy.  Neurological: She is alert and oriented to person, place, and time. She has normal reflexes. No cranial nerve deficit. She exhibits normal muscle tone. Coordination normal.  Skin: Skin is dry. No rash noted. She is not diaphoretic. No pallor.  Right hip surgical incision healed. R leg mild swelling.   Psychiatric: She has a normal mood and affect. Her behavior is normal. Judgment and thought content normal.    Filed Vitals:   05/08/14 1206  BP: 130/80  Pulse: 72  Temp: 97.6 F (36.4 C)  TempSrc: Tympanic  Resp: 18      Labs reviewed: Basic Metabolic Panel:  Recent Labs  05/19/13 1034  10/06/13 1516  12/27/13 1428 02/22/14 1100  03/20/14 1300 03/28/14 0458 03/29/14 1040 04/04/14 05/04/14  NA 137  < > 133*  --   --  139  --  139 138 132* 137 139  K 4.7  < > 4.9  --   --  3.7  --  3.3* 2.7* 3.5* 5.0 3.0*  CL 101  < > 100  --   --  98  --  95* 97 96  --   --   CO2 27  < > 29  --   --  28  --  27 22 23   --   --   GLUCOSE 95  < > 105*  --   --  79  --  100* 103* 104*  --   --  BUN 17  < > 24*  --   --  16  --  19 14 14 13 16   CREATININE 0.71  < > 0.82  --   --  0.80  < > 0.77 0.56 0.71 0.6 0.6  CALCIUM 9.3  < > 9.3  --   --  9.7  --  9.5 7.9* 8.4  --   --   MG 1.8  --  2.0  --   --  1.9  --   --   --   --   --   --   TSH 5.899*  < > 5.076*  < > 0.760 0.443  --   --   --   --  1.55  --   < > = values in this  interval not displayed. Liver Function Tests:  Recent Labs  10/06/13 1516 02/22/14 1100 03/20/14 1300 04/04/14 05/04/14  AST 14 18 18 21 21   ALT 9 15 12 13 16   ALKPHOS 50 70 71 69 52  BILITOT 0.4 0.6 0.3  --   --   PROT 6.3 6.2 6.5  --   --   ALBUMIN 4.2 4.0 3.7  --   --    No results for input(s): LIPASE, AMYLASE in the last 8760 hours. No results for input(s): AMMONIA in the last 8760 hours. CBC:  Recent Labs  10/06/13 1516 02/22/14 1100 03/20/14 1300 03/28/14 0458 03/29/14 0644 03/30/14 0535 04/04/14 05/04/14  WBC 7.4 11.9* 9.6 7.5 10.4 8.4 12.1 5.7  NEUTROABS 4.8 9.0* 6.9  --   --   --   --   --   HGB 11.8* 12.3 11.9* 8.1* 7.9* 7.3* 8.5* 10.8*  HCT 35.4* 36.3 37.0 24.9* 23.3* 22.7* 26* 35*  MCV 81.6 81.4 83.7 83.6 84.1 82.2  --   --   PLT 346 390 373 217 203 234 597* 278   Lipid Panel:  Recent Labs  05/19/13 1034 10/06/13 1516 02/22/14 1100  CHOL 213* 218* 243*  HDL 90 93 98  LDLCALC 104* 108* 120*  TRIG 95 84 124  CHOLHDL 2.4 2.3 2.5    Past Procedures:  03/27/14 X-ray Pelvis:  IMPRESSION: Total hip prosthesis on the right which is well seated. Posttraumatic change on the left. Moderate narrowing left hip joint. No acute fracture or dislocation.  Assessment/Plan Hypokalemia 04/04/14 K 5.0 05/04/14 K 3.0 05/08/14 Kcl 9meq daily. BMP     Acute blood loss anemia 03/30/14 Hgb 7.3 04/04/14 Hgb 8.5 continue Fe bid.  05/04/14 Hgb 10.8 05/08/14 continue Fe bid.     Constipation Stable, continue Senna S II qhs.      Right hip pain IMPRESSION: Total hip prosthesis on the right which is well seated. Posttraumatic change on the left. Moderate narrowing left hip joint. No acute fracture or dislocation. 04/25/14 Ortho: WBAT, ambulation, no abduction exercise.  04/28/14 X-ray R hip/pelvis and lumbar spine: L3 90% compression fx indeterminatined dating. TRH in place. A question of fx involving the proximal portion of the femoral attachment of the  prosthesis f/u Ortho.      Essential hypertension Controlled, takes Labetalol 100mg  bid.     Hypothyroidism Takes Levothyroxine 154mcg po daily.04/04/14 TSH 1.55    GERD Asymptomatic, continue Omeprazole 40mg  daily.       Family/ Staff Communication: observe the patient  Goals of Care: [place]  Labs BMP

## 2014-05-08 NOTE — Assessment & Plan Note (Signed)
03/30/14 Hgb 7.3 04/04/14 Hgb 8.5 continue Fe bid.  05/04/14 Hgb 10.8 05/08/14 continue Fe bid.

## 2014-05-08 NOTE — Assessment & Plan Note (Signed)
Takes Levothyroxine 173mcg po daily.04/04/14 TSH 1.55

## 2014-05-08 NOTE — Assessment & Plan Note (Signed)
IMPRESSION: Total hip prosthesis on the right which is well seated. Posttraumatic change on the left. Moderate narrowing left hip joint. No acute fracture or dislocation. 04/25/14 Ortho: WBAT, ambulation, no abduction exercise.  04/28/14 X-ray R hip/pelvis and lumbar spine: L3 90% compression fx indeterminatined dating. TRH in place. A question of fx involving the proximal portion of the femoral attachment of the prosthesis f/u Ortho.

## 2014-05-08 NOTE — Assessment & Plan Note (Signed)
Asymptomatic, continue Omeprazole 40mg  daily.

## 2014-05-08 NOTE — Assessment & Plan Note (Signed)
04/04/14 K 5.0 05/04/14 K 3.0 05/08/14 Kcl 42meq daily. BMP

## 2014-05-09 LAB — BASIC METABOLIC PANEL
BUN: 12 mg/dL (ref 4–21)
Creatinine: 0.5 mg/dL (ref 0.5–1.1)
Glucose: 85 mg/dL
POTASSIUM: 3.9 mmol/L (ref 3.4–5.3)
Sodium: 140 mmol/L (ref 137–147)

## 2014-05-10 ENCOUNTER — Other Ambulatory Visit: Payer: Self-pay | Admitting: Nurse Practitioner

## 2014-05-10 DIAGNOSIS — E876 Hypokalemia: Secondary | ICD-10-CM

## 2014-05-18 ENCOUNTER — Other Ambulatory Visit: Payer: Self-pay | Admitting: Nurse Practitioner

## 2014-05-18 LAB — BASIC METABOLIC PANEL
BUN: 12 mg/dL (ref 4–21)
Creatinine: 0.7 mg/dL (ref 0.5–1.1)
Glucose: 88 mg/dL
Potassium: 4.1 mmol/L (ref 3.4–5.3)
Sodium: 137 mmol/L (ref 137–147)

## 2014-05-22 ENCOUNTER — Encounter: Payer: Self-pay | Admitting: Internal Medicine

## 2014-05-22 ENCOUNTER — Non-Acute Institutional Stay (SKILLED_NURSING_FACILITY): Payer: Medicare Other | Admitting: Nurse Practitioner

## 2014-05-22 ENCOUNTER — Non-Acute Institutional Stay: Payer: Medicare Other | Admitting: Internal Medicine

## 2014-05-22 DIAGNOSIS — K219 Gastro-esophageal reflux disease without esophagitis: Secondary | ICD-10-CM

## 2014-05-22 DIAGNOSIS — IMO0002 Reserved for concepts with insufficient information to code with codable children: Secondary | ICD-10-CM | POA: Insufficient documentation

## 2014-05-22 DIAGNOSIS — K5900 Constipation, unspecified: Secondary | ICD-10-CM

## 2014-05-22 DIAGNOSIS — E039 Hypothyroidism, unspecified: Secondary | ICD-10-CM

## 2014-05-22 DIAGNOSIS — D126 Benign neoplasm of colon, unspecified: Secondary | ICD-10-CM | POA: Insufficient documentation

## 2014-05-22 DIAGNOSIS — E876 Hypokalemia: Secondary | ICD-10-CM

## 2014-05-22 DIAGNOSIS — F0280 Dementia in other diseases classified elsewhere without behavioral disturbance: Secondary | ICD-10-CM

## 2014-05-22 DIAGNOSIS — I10 Essential (primary) hypertension: Secondary | ICD-10-CM

## 2014-05-22 DIAGNOSIS — D62 Acute posthemorrhagic anemia: Secondary | ICD-10-CM

## 2014-05-22 DIAGNOSIS — H3530 Unspecified macular degeneration: Secondary | ICD-10-CM | POA: Insufficient documentation

## 2014-05-22 DIAGNOSIS — Z8619 Personal history of other infectious and parasitic diseases: Secondary | ICD-10-CM | POA: Insufficient documentation

## 2014-05-22 DIAGNOSIS — H9190 Unspecified hearing loss, unspecified ear: Secondary | ICD-10-CM | POA: Insufficient documentation

## 2014-05-22 DIAGNOSIS — M810 Age-related osteoporosis without current pathological fracture: Secondary | ICD-10-CM | POA: Insufficient documentation

## 2014-05-22 DIAGNOSIS — M25551 Pain in right hip: Secondary | ICD-10-CM

## 2014-05-22 DIAGNOSIS — R911 Solitary pulmonary nodule: Secondary | ICD-10-CM | POA: Insufficient documentation

## 2014-05-22 DIAGNOSIS — G309 Alzheimer's disease, unspecified: Secondary | ICD-10-CM

## 2014-05-22 DIAGNOSIS — K3184 Gastroparesis: Secondary | ICD-10-CM | POA: Insufficient documentation

## 2014-05-22 DIAGNOSIS — R2681 Unsteadiness on feet: Secondary | ICD-10-CM | POA: Insufficient documentation

## 2014-05-22 DIAGNOSIS — C439 Malignant melanoma of skin, unspecified: Secondary | ICD-10-CM | POA: Insufficient documentation

## 2014-05-22 HISTORY — DX: Dementia in other diseases classified elsewhere, unspecified severity, without behavioral disturbance, psychotic disturbance, mood disturbance, and anxiety: F02.80

## 2014-05-22 NOTE — Progress Notes (Signed)
Patient ID: Crystal Petersen, female   DOB: 01-16-1922, 81 y.o.   MRN: [id number]   Code Status: Full Code  Allergies  Allergen Reactions  . Ace Inhibitors     Elevated potassium  . Fosamax [Alendronate Sodium]   . Metoclopramide Hcl Other (See Comments)    Pt. Not sure  . Reglan [Metoclopramide]   . Statins Other (See Comments)    myalgia  . Sulfa Antibiotics   . Vesicare [Solifenacin]   . Vicodin [Hydrocodone-Acetaminophen] Other (See Comments)    "felt spaced out"  . Sulfonamide Derivatives Rash    Chief Complaint  Patient presents with  . Medical Management of Chronic Issues  . Dementia    HPI: Patient is a 81 y.o. female seen in the SNF at [facility] today for evaluation of memory lapses and other chronic medical conditions.  Problem List Items Addressed This Visit    Right hip pain    IMPRESSION: Total hip prosthesis on the right which is well seated. Posttraumatic change on the left. Moderate narrowing left hip joint. No acute fracture or dislocation. 04/25/14 Ortho: WBAT, ambulation, no abduction exercise.  04/28/14 X-ray R hip/pelvis and lumbar spine: L3 90% compression fx indeterminatined dating. TRH in place. A question of fx involving the proximal portion of the femoral attachment of the prosthesis f/u Ortho.  05/16/14 Ortho: may increase weight bearinf to tolerance and assistive devices as needed.  Pain is managed with Tramadol 50mg  qam/q6h prn, Norco q6h prn,           Hypothyroidism    Takes Levothyroxine 161mcg po daily.04/04/14 TSH 1.55        Hypokalemia    04/04/14 K 5.0 05/04/14 K 3.0 05/08/14 Kcl 36meq daily.  --BMP         GERD    Asymptomatic, continue Omeprazole 40mg  daily.        Essential hypertension    Controlled, takes Labetalol 100mg  bid.         Dementia of the Alzheimer's type - Primary    05/19/14 MMSE 18/30 Namenda.        Relevant Medications   memantine (NAMENDA XR) 28 MG CP24 24 hr capsule   Constipation    Stable, continue Senna S II qhs. Prn Colace bid and MiraLax daily available to her.         Acute blood loss anemia    03/30/14 Hgb 7.3 04/04/14 Hgb 8.5 continue Fe bid.  05/04/14 Hgb 10.8 05/08/14 continue Fe bid.            Review of Systems:  Review of Systems  Constitutional: Negative for fever, chills, weight loss, malaise/fatigue and diaphoresis.  HENT: Positive for hearing loss. Negative for congestion, ear discharge, ear pain, nosebleeds, sore throat and tinnitus.   Eyes: Negative for blurred vision, double vision, photophobia, pain, discharge and redness.  Respiratory: Negative for cough, hemoptysis, sputum production, shortness of breath, wheezing and stridor.   Cardiovascular: Negative for chest pain, orthopnea, claudication, leg swelling and PND.  Gastrointestinal: Negative for heartburn, nausea, vomiting, abdominal pain, diarrhea, constipation, blood in stool and melena.  Genitourinary: Positive for frequency. Negative for dysuria, urgency, hematuria and flank pain.  Musculoskeletal: Positive for back pain and joint pain. Negative for myalgias, falls and neck pain.  Skin: Negative for itching and rash.  Neurological: Negative for dizziness, tingling, tremors, sensory change, speech change, focal weakness, seizures, loss of consciousness, weakness and headaches.  Endo/Heme/Allergies: Negative for environmental allergies and polydipsia. Does not bruise/bleed  easily.  Psychiatric/Behavioral: Positive for depression and memory loss. Negative for suicidal ideas, hallucinations and substance abuse. The patient is nervous/anxious. The patient does not have insomnia.      Past Medical History  Diagnosis Date  . Hypertension   . Nonspecific abnormal electrocardiogram (ECG) (EKG)   . Stricture and stenosis of esophagus   . Esophageal reflux   . Diverticulosis of colon (without mention of hemorrhage)   . Benign neoplasm of colon   . Macular degeneration   .  Osteoarthritis     osteroperosis  . Other and unspecified hyperlipidemia   . HTN (hypertension)   . Shingles   . Cystocele   . Gastroparesis   . Skin cancer (melanoma)   . Hypothyroidism   . Cancer     pre-melanoma  . Hemorrhoids    Past Surgical History  Procedure Laterality Date  . Cholecystectomy    . Gallbladder surgery  2002  . Hemorrhoid surgery    . Appendectomy    . Thyroidectomy    . Left eye surgery    . Cataract extraction, bilateral    . Melenoma resection      right arm  . Bcc resection      legs/face  . Tonsillectomy    . Flexible sigmoidoscopy  05/12/2011    Procedure: FLEXIBLE SIGMOIDOSCOPY;  Surgeon: Inda Castle, MD;  Location: [facility];  Service: Endoscopy;  Laterality: N/A;  . Flexible sigmoidoscopy  09/10/2011    Procedure: FLEXIBLE SIGMOIDOSCOPY;  Surgeon: Inda Castle, MD;  Location: [facility];  Service: Endoscopy;  Laterality: N/A;  . Fracture surgery      left hip pin  . Flexible sigmoidoscopy N/A 08/16/2012    Procedure: FLEXIBLE SIGMOIDOSCOPY;  Surgeon: Inda Castle, MD;  Location: [facility];  Service: Endoscopy;  Laterality: N/A;  . Hemorrhoid banding N/A 08/16/2012    Procedure: HEMORRHOID BANDING;  Surgeon: Inda Castle, MD;  Location: [facility];  Service: Endoscopy;  Laterality: N/A;  . Total hip arthroplasty Right 03/27/2014    Procedure: TOTAL HIP ARTHROPLASTY ANTERIOR APPROACH;  Surgeon: Alta Corning, MD;  Location: [place];  Service: Orthopedics;  Laterality: Right;   Social History:   reports that she has never smoked. She has never used smokeless tobacco. She reports that she does not drink alcohol or use illicit drugs.  Family History  Problem Relation Age of Onset  . Esophageal cancer Brother   . Heart disease Father   . Hypertension Father   . Heart attack Father   . Colon cancer Neg Hx   . Malignant hyperthermia Neg Hx   . Diabetes Daughter     Medications: Patient's Medications  New Prescriptions   No  medications on file  Previous Medications   ASPIRIN EC 325 MG TABLET    Take 1 tablet (325 mg total) by mouth 2 (two) times daily after a meal.   CALCIUM CARBONATE (CAL-GEST ANTACID) 500 MG CHEWABLE TABLET    Chew 1 tablet by mouth as needed.    CHOLECALCIFEROL (VITAMIN D) 2000 UNITS TABLET    Take 4,000 Units by mouth daily.   DOCUSATE SODIUM (COLACE) 100 MG CAPSULE    Take 100 mg by mouth 2 (two) times daily as needed.    FERROUS SULFATE 325 (65 FE) MG TABLET    Take 1 tablet (325 mg total) by mouth 2 (two) times daily with a meal.   LABETALOL (NORMODYNE) 100 MG TABLET    TAKE 1 TABLET BY MOUTH  TWICE A DAY FOR BP   LEVOTHYROXINE (SYNTHROID) 100 MCG TABLET    Take 1 tablet (100 mcg total) by mouth daily.   MEMANTINE (NAMENDA XR) 28 MG CP24 24 HR CAPSULE    Take 28 mg by mouth daily.   METHOCARBAMOL (ROBAXIN) 500 MG TABLET    Take 1 tablet (500 mg total) by mouth every 8 (eight) hours as needed for muscle spasms.   MUPIROCIN OINTMENT (BACTROBAN) 2 %    daily.   OMEPRAZOLE (PRILOSEC) 40 MG CAPSULE    TAKE ONE CAPSULE BY MOUTH EVERY DAY FOR ACID REFLUX   OXYCODONE-ACETAMINOPHEN (PERCOCET/ROXICET) 5-325 MG PER TABLET    Take 1 tablet by mouth every 8 (eight) hours as needed for severe pain.   POLYETHYLENE GLYCOL (MIRALAX / GLYCOLAX) PACKET    Take 17 g by mouth as needed for mild constipation.    POTASSIUM CHLORIDE SA (K-DUR,KLOR-CON) 20 MEQ TABLET    Take 20 mEq by mouth daily.   TRAMADOL (ULTRAM) 50 MG TABLET    Take 1 tablet (50 mg total) by mouth every 6 (six) hours as needed.   ZETIA 10 MG TABLET    Take 10 mg by mouth daily.  Modified Medications   No medications on file  Discontinued Medications   No medications on file     Physical Exam: Physical Exam  Constitutional: She is oriented to person, place, and time. She appears well-developed and well-nourished. No distress.  HENT:  Head: Normocephalic and atraumatic.  Right Ear: External ear normal.  Left Ear: External ear normal.    Nose: Nose normal.  Mouth/Throat: Oropharynx is clear and moist. No oropharyngeal exudate.  Eyes: Conjunctivae and EOM are normal. Pupils are equal, round, and reactive to light. Right eye exhibits no discharge. Left eye exhibits no discharge. No scleral icterus.  Neck: Normal range of motion. Neck supple. No JVD present. No tracheal deviation present. No thyromegaly present.  Cardiovascular: Normal rate, regular rhythm, normal heart sounds and intact distal pulses.   No murmur heard. Pulmonary/Chest: Effort normal and breath sounds normal. No stridor. No respiratory distress. She has no wheezes. She has no rales. She exhibits no tenderness.  Abdominal: Soft. Bowel sounds are normal. She exhibits no distension and no mass. There is no tenderness. There is no rebound and no guarding.  Musculoskeletal: Normal range of motion. She exhibits tenderness. She exhibits no edema.  Right hip pain with ROM.  Lymphadenopathy:    She has no cervical adenopathy.  Neurological: She is alert and oriented to person, place, and time. She has normal reflexes. No cranial nerve deficit. She exhibits normal muscle tone. Coordination normal.  Skin: Skin is dry. No rash noted. She is not diaphoretic. No pallor.  Right hip surgical incision healed. R leg mild swelling.   Psychiatric: Her behavior is normal. Judgment and thought content normal. Her mood appears anxious. Her speech is not rapid and/or pressured, not delayed, not tangential and not slurred. Cognition and memory are impaired. She exhibits a depressed mood. She is communicative. She exhibits abnormal recent memory. She exhibits normal remote memory.    Filed Vitals:   05/22/14 1452  BP: 160/70  Pulse: 86  Temp: 97.6 F (36.4 C)  TempSrc: Tympanic  Resp: 20      Labs reviewed: Basic Metabolic Panel:  Recent Labs  10/06/13 1516  12/27/13 1428 02/22/14 1100  03/20/14 1300 03/28/14 0458 03/29/14 1040  04/04/14 05/04/14 05/09/14 05/18/14  NA  133*  --   --  139  --  139 138 132*  < > 137 139 140 137  K 4.9  --   --  3.7  --  3.3* 2.7* 3.5*  --  5.0 3.0* 3.9 4.1  CL 100  --   --  98  --  95* 97 96  --   --   --   --   --   CO2 29  --   --  28  --  27 22 23   --   --   --   --   --   GLUCOSE 105*  --   --  79  --  100* 103* 104*  --   --   --   --   --   BUN 24*  --   --  16  --  19 14 14   < > 13 16 12 12   CREATININE 0.82  --   --  0.80  < > 0.77 0.56 0.71  < > 0.6 0.6 0.5 0.7  CALCIUM 9.3  --   --  9.7  --  9.5 7.9* 8.4  --   --   --   --   --   MG 2.0  --   --  1.9  --   --   --   --   --   --   --   --   --   TSH 5.076*  < > 0.760 0.443  --   --   --   --   --  1.55  --   --   --   < > = values in this interval not displayed. Liver Function Tests:  Recent Labs  10/06/13 1516 02/22/14 1100 03/20/14 1300 04/04/14 05/04/14  AST 14 18 18 21 21   ALT 9 15 12 13 16   ALKPHOS 50 70 71 69 52  BILITOT 0.4 0.6 0.3  --   --   PROT 6.3 6.2 6.5  --   --   ALBUMIN 4.2 4.0 3.7  --   --    No results for input(s): LIPASE, AMYLASE in the last 8760 hours. No results for input(s): AMMONIA in the last 8760 hours. CBC:  Recent Labs  10/06/13 1516 02/22/14 1100 03/20/14 1300 03/28/14 0458 03/29/14 0644 03/30/14 0535 04/04/14 05/04/14  WBC 7.4 11.9* 9.6 7.5 10.4 8.4 12.1 5.7  NEUTROABS 4.8 9.0* 6.9  --   --   --   --   --   HGB 11.8* 12.3 11.9* 8.1* 7.9* 7.3* 8.5* 10.8*  HCT 35.4* 36.3 37.0 24.9* 23.3* 22.7* 26* 35*  MCV 81.6 81.4 83.7 83.6 84.1 82.2  --   --   PLT 346 390 373 217 203 234 597* 278   Lipid Panel:  Recent Labs  10/06/13 1516 02/22/14 1100  CHOL 218* 243*  HDL 93 98  LDLCALC 108* 120*  TRIG 84 124  CHOLHDL 2.3 2.5    Past Procedures:  03/27/14 X-ray Pelvis:  IMPRESSION: Total hip prosthesis on the right which is well seated. Posttraumatic change on the left. Moderate narrowing left hip joint. No acute fracture or dislocation.  Assessment/Plan Dementia of the Alzheimer's type 05/19/14 MMSE 18/30  Namenda.     Hypothyroidism Takes Levothyroxine 169mcg po daily.04/04/14 TSH 1.55     GERD Asymptomatic, continue Omeprazole 40mg  daily.     Right hip pain IMPRESSION: Total hip prosthesis on the right which is well seated. Posttraumatic change on the left. Moderate narrowing left  hip joint. No acute fracture or dislocation. 04/25/14 Ortho: WBAT, ambulation, no abduction exercise.  04/28/14 X-ray R hip/pelvis and lumbar spine: L3 90% compression fx indeterminatined dating. TRH in place. A question of fx involving the proximal portion of the femoral attachment of the prosthesis f/u Ortho.  05/16/14 Ortho: may increase weight bearinf to tolerance and assistive devices as needed.  Pain is managed with Tramadol 50mg  qam/q6h prn, Norco q6h prn,        Acute blood loss anemia 03/30/14 Hgb 7.3 04/04/14 Hgb 8.5 continue Fe bid.  05/04/14 Hgb 10.8 05/08/14 continue Fe bid.      Constipation Stable, continue Senna S II qhs. Prn Colace bid and MiraLax daily available to her.      Essential hypertension Controlled, takes Labetalol 100mg  bid.      Hypokalemia 04/04/14 K 5.0 05/04/14 K 3.0 05/08/14 Kcl 38meq daily.  --BMP        Family/ Staff Communication: observe the patient  Goals of Care: [place]  Labs BMP

## 2014-05-22 NOTE — Progress Notes (Signed)
Patient ID: Crystal Petersen, female   DOB: 1921/12/30, 81 y.o.   MRN: [id number] Opened in error

## 2014-05-22 NOTE — Assessment & Plan Note (Signed)
05/19/14 MMSE 18/30 Namenda.

## 2014-05-22 NOTE — Progress Notes (Signed)
Patient ID: Crystal Petersen, female   DOB: [birth year], 81 y.o.   MRN: [id number]    HISTORY AND PHYSICAL  Location:  [facility] Room Number: [id number] Place of Service: SNF [phone])   Extended Emergency Contact Information Primary Emergency Contact: Black,Kate Address: [address]          [place], [place] [address] [place] Phone: [phone] Relation: Daughter Secondary Emergency Contact: [place], Bugh [place], [place] [address] [place] Phone: [phone] Relation: Son  Advanced Directive information    Chief Complaint  Patient presents with  . New Admit To SNF    following hospitalization for right THA due to OA    HPI:  New admission to SNF at [facility] on 03/30/14 following hospitalization for THA of the right hip due to pain from OA..  Had acute blood loss anemia following the surgery.   Multiple other chronic medical problems that seem to be stable are listed below.  Past Medical History  Diagnosis Date  . Hypertension   . Nonspecific abnormal electrocardiogram (ECG) (EKG)   . Stricture and stenosis of esophagus   . Esophageal reflux   . Diverticulosis of colon (without mention of hemorrhage)   . Benign neoplasm of colon   . Macular degeneration   . Osteoarthritis     right hip  . Other and unspecified hyperlipidemia   . HTN (hypertension)   . Shingles   . Cystocele   . Gastroparesis   . Skin cancer (melanoma)   . Hypothyroidism   . Cancer     pre-melanoma  . Hemorrhoids   . Primary osteoarthritis of right hip 03/27/2014  . Acute blood loss anemia 03/29/2014    03/30/14 Hgb 7.3 04/04/14 Hgb 8.5 continue Fe bid.  05/04/14 Hgb 10.8   . Dementia of the Alzheimer's type 05/22/2014    05/19/14 MMSE 18/30 [place].    . Diastolic dysfunction 10/31/9468  . Diverticulosis of large intestine 06/28/2004  . Essential hypertension 07/05/2013  . Hyperlipidemia 07/05/2013  . Internal hemorrhoids without mention of  complication 96/05/8364  . Prediabetes 11/17/2013  . Stricture and stenosis of esophagus 10/27/2007  . Vitamin D deficiency 11/17/2013    Past Surgical History  Procedure Laterality Date  . Cholecystectomy    . Gallbladder surgery  2002  . Hemorrhoid surgery    . Appendectomy    . Thyroidectomy    . Left eye surgery    . Cataract extraction, bilateral    . Melenoma resection      right arm  . Bcc resection      legs/face  . Tonsillectomy    . Flexible sigmoidoscopy  05/12/2011    Procedure: FLEXIBLE SIGMOIDOSCOPY;  Surgeon: Inda Castle, MD;  Location: [facility];  Service: Endoscopy;  Laterality: N/A;  . Flexible sigmoidoscopy  09/10/2011    Procedure: FLEXIBLE SIGMOIDOSCOPY;  Surgeon: Inda Castle, MD;  Location: [facility];  Service: Endoscopy;  Laterality: N/A;  . Fracture surgery      left hip pin  . Flexible sigmoidoscopy N/A 08/16/2012    Procedure: FLEXIBLE SIGMOIDOSCOPY;  Surgeon: Inda Castle, MD;  Location: [facility];  Service: Endoscopy;  Laterality: N/A;  . Hemorrhoid banding N/A 08/16/2012    Procedure: HEMORRHOID BANDING;  Surgeon: Inda Castle, MD;  Location: [facility];  Service: Endoscopy;  Laterality: N/A;  . Total hip arthroplasty Right 03/27/2014    Procedure: TOTAL  HIP ARTHROPLASTY ANTERIOR APPROACH;  Surgeon: Alta Corning, MD;  Location: [facility];  Service: Orthopedics;  Laterality: Right;    Patient Care Team: Unk Pinto, MD as PCP - General (Internal Medicine) Hurman Horn, MD as Consulting Physician (Ophthalmology) Burnell Blanks, MD as Consulting Physician (Cardiology) Inda Castle, MD as Consulting Physician (Gastroenterology) Jari Pigg, MD as Consulting Physician (Dermatology) Alta Corning, MD as Consulting Physician (Orthopedic Surgery)  History   Social History  . Marital Status: Widowed    Spouse Name: N/A    Number of Children: 31  . Years of Education: N/A   Occupational History  . retired    Social  History Main Topics  . Smoking status: Never Smoker   . Smokeless tobacco: Never Used  . Alcohol Use: No  . Drug Use: No  . Sexual Activity: Not on file   Other Topics Concern  . 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    Pt does not get regular exercise           reports that she has never smoked. She has never used smokeless tobacco. She reports that she does not drink alcohol or use illicit drugs.  Family History  Problem Relation Age of Onset  . Esophageal cancer Brother   . Heart disease Father   . Hypertension Father   . Heart attack Father   . Colon cancer Neg Hx   . Malignant hyperthermia Neg Hx   . Diabetes Daughter    Family Status  Relation Status Death Age  . Father Deceased 56    CAD/MI  . Mother Deceased 53    ruptured appendix  . Brother Deceased     pancreatic cancer  . Sister Deceased     encephalitis    Immunization History  Administered Date(s) Administered  . Influenza, High Dose Seasonal PF 12/27/2013  . Influenza-Unspecified 01/12/2013  . Pneumococcal-Unspecified 07/01/2012  . Td 05/27/2004  . Tdap 02/21/2012  . Zoster 04/15/2007    Allergies  Allergen Reactions  . Ace Inhibitors     Elevated potassium  . Fosamax [Alendronate Sodium]   . Metoclopramide Hcl Other (See Comments)    Pt. Not sure  . Reglan [Metoclopramide]   . Statins Other (See Comments)    myalgia  . Sulfa Antibiotics   . Vesicare [Solifenacin]   . Vicodin [Hydrocodone-Acetaminophen] Other (See Comments)    "felt spaced out"  . Sulfonamide Derivatives Rash    Medications: Patient's Medications  New Prescriptions   No medications on file  Previous Medications   ASPIRIN EC 325 MG TABLET    Take 1 tablet (325 mg total) by mouth 2 (two) times daily after a meal.   CALCIUM CARBONATE (CAL-GEST ANTACID) 500 MG CHEWABLE TABLET    Chew 1 tablet by mouth as needed.    CHOLECALCIFEROL  (VITAMIN D) 2000 UNITS TABLET    Take 4,000 Units by mouth daily.   DOCUSATE SODIUM (COLACE) 100 MG CAPSULE    Take 100 mg by mouth 2 (two) times daily as needed.    FERROUS SULFATE 325 (65 FE) MG TABLET    Take 1 tablet (325 mg total) by mouth 2 (two) times daily with a meal.   LABETALOL (NORMODYNE) 100 MG TABLET    TAKE 1 TABLET BY MOUTH TWICE A DAY FOR BP   LEVOTHYROXINE (SYNTHROID) 100  MCG TABLET    Take 1 tablet (100 mcg total) by mouth daily.   MEMANTINE (NAMENDA XR) 28 MG CP24 24 HR CAPSULE    Take 28 mg by mouth daily.   METHOCARBAMOL (ROBAXIN) 500 MG TABLET    Take 1 tablet (500 mg total) by mouth every 8 (eight) hours as needed for muscle spasms.   MUPIROCIN OINTMENT (BACTROBAN) 2 %    daily.   OMEPRAZOLE (PRILOSEC) 40 MG CAPSULE    TAKE ONE CAPSULE BY MOUTH EVERY DAY FOR ACID REFLUX   OXYCODONE-ACETAMINOPHEN (PERCOCET/ROXICET) 5-325 MG PER TABLET    Take 1 tablet by mouth every 8 (eight) hours as needed for severe pain.   POLYETHYLENE GLYCOL (MIRALAX / GLYCOLAX) PACKET    Take 17 g by mouth as needed for mild constipation.    POTASSIUM CHLORIDE SA (K-DUR,KLOR-CON) 20 MEQ TABLET    Take 20 mEq by mouth daily.   TRAMADOL (ULTRAM) 50 MG TABLET    Take 1 tablet (50 mg total) by mouth every 6 (six) hours as needed.   ZETIA 10 MG TABLET    Take 10 mg by mouth daily.  Modified Medications   No medications on file  Discontinued Medications   No medications on file    Review of Systems  Constitutional: Negative for fever, chills, diaphoresis, activity change, appetite change, fatigue and unexpected weight change.  HENT: Positive for hearing loss. Negative for congestion, ear discharge, ear pain, postnasal drip, rhinorrhea, sore throat, tinnitus, trouble swallowing and voice change.   Eyes: Negative for pain, redness, itching and visual disturbance (corrective lenses).       Macular degeneration  Respiratory: Negative.  Negative for cough, choking, shortness of breath and wheezing.         HX of abnormal CXR with left lung nodule at the base since 2014  Cardiovascular: Negative for chest pain, palpitations and leg swelling.  Gastrointestinal: Negative for nausea, abdominal pain, diarrhea, constipation and abdominal distention.       Hemorrhoids and benign adenomatous polyp  Endocrine: Negative for cold intolerance, heat intolerance, polydipsia, polyphagia and polyuria.       History of hypothyroidism and prediabetes  Genitourinary: Negative for dysuria, urgency, frequency, hematuria, flank pain, vaginal discharge, difficulty urinating and pelvic pain.       Incontinence. Hx of cystocele  Musculoskeletal: Positive for arthralgias (right hip pain and recent right THA) and gait problem. Negative for myalgias, back pain, neck pain and neck stiffness.  Skin: Negative for color change, pallor and rash.  Allergic/Immunologic: Negative.   Neurological: Negative for dizziness, tremors, seizures, syncope, weakness, numbness and headaches.       Memory deficit  Hematological: Negative for adenopathy. Does not bruise/bleed easily.  Psychiatric/Behavioral: Negative for suicidal ideas, hallucinations, behavioral problems, confusion, sleep disturbance, dysphoric mood and agitation. The patient is not nervous/anxious and is not hyperactive.        Anxious     Filed Vitals:   05/22/14 1944  BP: 130/74  Pulse: 74  Temp: 98 F (36.7 C)  Resp: 14  Height: 5' 1"  (1.549 m)  Weight: 125 lb (56.7 kg)   Body mass index is 23.63 kg/(m^2).  Physical Exam  Constitutional: She is oriented to person, place, and time. She appears well-developed and well-nourished. No distress.  HENT:  Right Ear: External ear normal.  Left Ear: External ear normal.  Nose: Nose normal.  Mouth/Throat: Oropharynx is clear and moist. No oropharyngeal exudate.  Bilateral loss of hearing and use of aids  Eyes: Conjunctivae and EOM are normal. Pupils are equal, round, and reactive to light. No scleral icterus.  Neck:  No JVD present. No tracheal deviation present. No thyromegaly present.  Cardiovascular: Normal rate, regular rhythm, normal heart sounds and intact distal pulses.  Exam reveals no gallop and no friction rub.   No murmur heard. Pulmonary/Chest: Effort normal. No respiratory distress. She has no wheezes. She has no rales. She exhibits no tenderness.  Abdominal: She exhibits no distension and no mass. There is no tenderness.  Musculoskeletal: Normal range of motion. She exhibits no edema or tenderness.  Swollen and tender right hip. Surgical incision is healing and there is not excessive erythema or bruising, Kyphosis.  Lymphadenopathy:    She has no cervical adenopathy.  Neurological: She is alert and oriented to person, place, and time. No cranial nerve deficit. Coordination normal.  Memory impaired.  Skin: No rash noted. She is not diaphoretic. No erythema. No pallor.  Psychiatric: She has a normal mood and affect. Her behavior is normal. Judgment and thought content normal.     Labs reviewed: Admission on 03/27/2014, Discharged on 03/30/2014  Component Date Value Ref Range Status  . WBC 03/28/2014 7.5  4.0 - 10.5 K/uL Final  . RBC 03/28/2014 2.98* 3.87 - 5.11 MIL/uL Final  . Hemoglobin 03/28/2014 8.1* 12.0 - 15.0 g/dL Final  . HCT 03/28/2014 24.9* 36.0 - 46.0 % Final  . MCV 03/28/2014 83.6  78.0 - 100.0 fL Final  . MCH 03/28/2014 27.2  26.0 - 34.0 pg Final  . MCHC 03/28/2014 32.5  30.0 - 36.0 g/dL Final  . RDW 03/28/2014 14.1  11.5 - 15.5 % Final  . Platelets 03/28/2014 217  150 - 400 K/uL Final  . Sodium 03/28/2014 138  137 - 147 mEq/L Final  . Potassium 03/28/2014 2.7* 3.7 - 5.3 mEq/L Final   Comment: CRITICAL RESULT CALLED TO, READ BACK BY AND VERIFIED WITH: Glena Norfolk RN 03/28/14 0658 COSTELLO B   . Chloride 03/28/2014 97  96 - 112 mEq/L Final  . CO2 03/28/2014 22  19 - 32 mEq/L Final  . Glucose, Bld 03/28/2014 103* 70 - 99 mg/dL Final  . BUN 03/28/2014 14  6 - 23 mg/dL Final    . Creatinine, Ser 03/28/2014 0.56  0.50 - 1.10 mg/dL Final  . Calcium 03/28/2014 7.9* 8.4 - 10.5 mg/dL Final  . GFR calc non Af Amer 03/28/2014 80* >90 mL/min Final  . GFR calc Af Amer 03/28/2014 >90  >90 mL/min Final   Comment: (NOTE) The eGFR has been calculated using the CKD EPI equation. This calculation has not been validated in all clinical situations. eGFR's persistently <90 mL/min signify possible Chronic Kidney Disease.   . Anion gap 03/28/2014 19* 5 - 15 Final  . WBC 03/29/2014 10.4  4.0 - 10.5 K/uL Final  . RBC 03/29/2014 2.77* 3.87 - 5.11 MIL/uL Final  . Hemoglobin 03/29/2014 7.9* 12.0 - 15.0 g/dL Final  . HCT 03/29/2014 23.3* 36.0 - 46.0 % Final  . MCV 03/29/2014 84.1  78.0 - 100.0 fL Final  . MCH 03/29/2014 28.5  26.0 - 34.0 pg Final  . MCHC 03/29/2014 33.9  30.0 - 36.0 g/dL Final  . RDW 03/29/2014 14.4  11.5 - 15.5 % Final  . Platelets 03/29/2014 203  150 - 400 K/uL Final  . Sodium 03/29/2014 132* 137 - 147 mEq/L Final  . Potassium 03/29/2014 3.5* 3.7 - 5.3 mEq/L Final  . Chloride 03/29/2014 96  96 - 112 mEq/L  Final  . CO2 03/29/2014 23  19 - 32 mEq/L Final  . Glucose, Bld 03/29/2014 104* 70 - 99 mg/dL Final  . BUN 03/29/2014 14  6 - 23 mg/dL Final  . Creatinine, Ser 03/29/2014 0.71  0.50 - 1.10 mg/dL Final  . Calcium 03/29/2014 8.4  8.4 - 10.5 mg/dL Final  . GFR calc non Af Amer 03/29/2014 74* >90 mL/min Final  . GFR calc Af Amer 03/29/2014 85* >90 mL/min Final   Comment: (NOTE) The eGFR has been calculated using the CKD EPI equation. This calculation has not been validated in all clinical situations. eGFR's persistently <90 mL/min signify possible Chronic Kidney Disease.   . Anion gap 03/29/2014 13  5 - 15 Final  . WBC 03/30/2014 8.4  4.0 - 10.5 K/uL Final  . RBC 03/30/2014 2.76* 3.87 - 5.11 MIL/uL Final  . Hemoglobin 03/30/2014 7.3* 12.0 - 15.0 g/dL Final  . HCT 03/30/2014 22.7* 36.0 - 46.0 % Final  . MCV 03/30/2014 82.2  78.0 - 100.0 fL Final  . MCH  03/30/2014 26.4  26.0 - 34.0 pg Final  . MCHC 03/30/2014 32.2  30.0 - 36.0 g/dL Final  . RDW 03/30/2014 14.7  11.5 - 15.5 % Final  . Platelets 03/30/2014 234  150 - 400 K/uL Final  Nursing Home on 03/30/2014  Component Date Value Ref Range Status  . HM Sigmoidoscopy 05/22/2012 internal hemorrhoid, polyp; Dr. Deatra Ina   Final  [facility] Outpatient Visit on 03/20/2014  Component Date Value Ref Range Status  . ABO/RH(D) 03/20/2014 O POS   Final  Hospital Outpatient Visit on 03/20/2014  Component Date Value Ref Range Status  . MRSA, PCR 03/20/2014 NEGATIVE  NEGATIVE Final  . Staphylococcus aureus 03/20/2014 POSITIVE* NEGATIVE Final   Comment:        The Xpert SA Assay (FDA approved for NASAL specimens in patients over 41 years of age), is one component of a comprehensive surveillance program.  Test performance has been validated by [employer] for patients greater than or equal to 25 year old. It is not intended to diagnose infection nor to guide or monitor treatment.   Marland Kitchen aPTT 03/20/2014 31  24 - 37 seconds Final  . WBC 03/20/2014 9.6  4.0 - 10.5 K/uL Final  . RBC 03/20/2014 4.42  3.87 - 5.11 MIL/uL Final  . Hemoglobin 03/20/2014 11.9* 12.0 - 15.0 g/dL Final  . HCT 03/20/2014 37.0  36.0 - 46.0 % Final  . MCV 03/20/2014 83.7  78.0 - 100.0 fL Final  . MCH 03/20/2014 26.9  26.0 - 34.0 pg Final  . MCHC 03/20/2014 32.2  30.0 - 36.0 g/dL Final  . RDW 03/20/2014 14.1  11.5 - 15.5 % Final  . Platelets 03/20/2014 373  150 - 400 K/uL Final  . Neutrophils Relative % 03/20/2014 72  43 - 77 % Final  . Neutro Abs 03/20/2014 6.9  1.7 - 7.7 K/uL Final  . Lymphocytes Relative 03/20/2014 14  12 - 46 % Final  . Lymphs Abs 03/20/2014 1.4  0.7 - 4.0 K/uL Final  . Monocytes Relative 03/20/2014 13* 3 - 12 % Final  . Monocytes Absolute 03/20/2014 1.2* 0.1 - 1.0 K/uL Final  . Eosinophils Relative 03/20/2014 1  0 - 5 % Final  . Eosinophils Absolute 03/20/2014 0.1  0.0 - 0.7 K/uL Final  . Basophils  Relative 03/20/2014 0  0 - 1 % Final  . Basophils Absolute 03/20/2014 0.0  0.0 - 0.1 K/uL Final  . Sodium 03/20/2014 139  137 - 147 mEq/L Final  . Potassium 03/20/2014 3.3* 3.7 - 5.3 mEq/L Final  . Chloride 03/20/2014 95* 96 - 112 mEq/L Final  . CO2 03/20/2014 27  19 - 32 mEq/L Final  . Glucose, Bld 03/20/2014 100* 70 - 99 mg/dL Final  . BUN 03/20/2014 19  6 - 23 mg/dL Final  . Creatinine, Ser 03/20/2014 0.77  0.50 - 1.10 mg/dL Final  . Calcium 03/20/2014 9.5  8.4 - 10.5 mg/dL Final  . Total Protein 03/20/2014 6.5  6.0 - 8.3 g/dL Final  . Albumin 03/20/2014 3.7  3.5 - 5.2 g/dL Final  . AST 03/20/2014 18  0 - 37 U/L Final  . ALT 03/20/2014 12  0 - 35 U/L Final  . Alkaline Phosphatase 03/20/2014 71  39 - 117 U/L Final  . Total Bilirubin 03/20/2014 0.3  0.3 - 1.2 mg/dL Final  . GFR calc non Af Amer 03/20/2014 72* >90 mL/min Final  . GFR calc Af Amer 03/20/2014 83* >90 mL/min Final   Comment: (NOTE) The eGFR has been calculated using the CKD EPI equation. This calculation has not been validated in all clinical situations. eGFR's persistently <90 mL/min signify possible Chronic Kidney Disease.   . Anion gap 03/20/2014 17* 5 - 15 Final  . Prothrombin Time 03/20/2014 13.9  11.6 - 15.2 seconds Final  . INR 03/20/2014 1.05  0.00 - 1.49 Final  . ABO/RH(D) 03/20/2014 O POS   Final  . Antibody Screen 03/20/2014 NEG   Final  . Sample Expiration 03/20/2014 04/03/2014   Final  . Color, Urine 03/20/2014 YELLOW  YELLOW Final  . APPearance 03/20/2014 CLEAR  CLEAR Final  . Specific Gravity, Urine 03/20/2014 1.025  1.005 - 1.030 Final  . pH 03/20/2014 6.0  5.0 - 8.0 Final  . Glucose, UA 03/20/2014 NEGATIVE  NEGATIVE mg/dL Final  . Hgb urine dipstick 03/20/2014 SMALL* NEGATIVE Final  . Bilirubin Urine 03/20/2014 SMALL* NEGATIVE Final  . Ketones, ur 03/20/2014 15* NEGATIVE mg/dL Final  . Protein, ur 03/20/2014 30* NEGATIVE mg/dL Final  . Urobilinogen, UA 03/20/2014 0.2  0.0 - 1.0 mg/dL Final  .  Nitrite 03/20/2014 NEGATIVE  NEGATIVE Final  . Leukocytes, UA 03/20/2014 TRACE* NEGATIVE Final  . Squamous Epithelial / LPF 03/20/2014 FEW* RARE Final  . WBC, UA 03/20/2014 3-6  <3 WBC/hpf Final  . RBC / HPF 03/20/2014 0-2  <3 RBC/hpf Final  . Bacteria, UA 03/20/2014 FEW* RARE Final  . Casts 03/20/2014 HYALINE CASTS* NEGATIVE Final  . Urine-Other 03/20/2014 MUCOUS PRESENT   Final  Office Visit on 02/22/2014  Component Date Value Ref Range Status  . WBC 02/22/2014 11.9* 4.0 - 10.5 K/uL Final  . RBC 02/22/2014 4.46  3.87 - 5.11 MIL/uL Final  . Hemoglobin 02/22/2014 12.3  12.0 - 15.0 g/dL Final  . HCT 02/22/2014 36.3  36.0 - 46.0 % Final  . MCV 02/22/2014 81.4  78.0 - 100.0 fL Final  . MCH 02/22/2014 27.6  26.0 - 34.0 pg Final  . MCHC 02/22/2014 33.9  30.0 - 36.0 g/dL Final  . RDW 02/22/2014 14.9  11.5 - 15.5 % Final  . Platelets 02/22/2014 390  150 - 400 K/uL Final  . Neutrophils Relative % 02/22/2014 76  43 - 77 % Final  . Neutro Abs 02/22/2014 9.0* 1.7 - 7.7 K/uL Final  . Lymphocytes Relative 02/22/2014 11* 12 - 46 % Final  . Lymphs Abs 02/22/2014 1.3  0.7 - 4.0 K/uL Final  . Monocytes Relative 02/22/2014 12  3 -  12 % Final  . Monocytes Absolute 02/22/2014 1.4* 0.1 - 1.0 K/uL Final  . Eosinophils Relative 02/22/2014 1  0 - 5 % Final  . Eosinophils Absolute 02/22/2014 0.1  0.0 - 0.7 K/uL Final  . Basophils Relative 02/22/2014 0  0 - 1 % Final  . Basophils Absolute 02/22/2014 0.0  0.0 - 0.1 K/uL Final  . Smear Review 02/22/2014 Criteria for review not met   Final  . Sodium 02/22/2014 139  135 - 145 mEq/L Final  . Potassium 02/22/2014 3.7  3.5 - 5.3 mEq/L Final  . Chloride 02/22/2014 98  96 - 112 mEq/L Final  . CO2 02/22/2014 28  19 - 32 mEq/L Final  . Glucose, Bld 02/22/2014 79  70 - 99 mg/dL Final  . BUN 02/22/2014 16  6 - 23 mg/dL Final  . Creat 02/22/2014 0.80  0.50 - 1.10 mg/dL Final  . Calcium 02/22/2014 9.7  8.4 - 10.5 mg/dL Final  . GFR, Est African American 02/22/2014 75    Final  . GFR, Est Non African American 02/22/2014 65   Final   Comment:   The estimated GFR is a calculation valid for adults (>=34 years old) that uses the CKD-EPI algorithm to adjust for age and sex. It is   not to be used for children, pregnant women, hospitalized patients,    patients on dialysis, or with rapidly changing kidney function. According to the NKDEP, eGFR >89 is normal, 60-89 shows mild impairment, 30-59 shows moderate impairment, 15-29 shows severe impairment and <15 is ESRD.     Marland Kitchen Total Bilirubin 02/22/2014 0.6  0.2 - 1.2 mg/dL Final  . Bilirubin, Direct 02/22/2014 0.1  0.0 - 0.3 mg/dL Final  . Indirect Bilirubin 02/22/2014 0.5  0.2 - 1.2 mg/dL Final  . Alkaline Phosphatase 02/22/2014 70  39 - 117 U/L Final  . AST 02/22/2014 18  0 - 37 U/L Final  . ALT 02/22/2014 15  0 - 35 U/L Final  . Total Protein 02/22/2014 6.2  6.0 - 8.3 g/dL Final  . Albumin 02/22/2014 4.0  3.5 - 5.2 g/dL Final  . Cholesterol 02/22/2014 243* 0 - 200 mg/dL Final   Comment: ATP III Classification:       < 200        mg/dL        Desirable      200 - 239     mg/dL        Borderline High      >= 240        mg/dL        High     . Triglycerides 02/22/2014 124  <150 mg/dL Final  . HDL 02/22/2014 98  >39 mg/dL Final  . Total CHOL/HDL Ratio 02/22/2014 2.5   Final  . VLDL 02/22/2014 25  0 - 40 mg/dL Final  . LDL Cholesterol 02/22/2014 120* 0 - 99 mg/dL Final   Comment:   Total Cholesterol/HDL Ratio:CHD Risk                        Coronary Heart Disease Risk Table                                        Men       Women          1/2 Average Risk  3.4        3.3              Average Risk              5.0        4.4           2X Average Risk              9.6        7.1           3X Average Risk             23.4       11.0 Use the calculated Patient Ratio above and the CHD Risk table  to determine the patient's CHD Risk. ATP III Classification (LDL):       < 100        mg/dL          Optimal      100 - 129     mg/dL         Near or Above Optimal      130 - 159     mg/dL         Borderline High      160 - 189     mg/dL         High       > 190        mg/dL         Very High     . TSH 02/22/2014 0.443  0.350 - 4.500 uIU/mL Final  . Magnesium 02/22/2014 1.9  1.5 - 2.5 mg/dL Final     Assessment/Plan  1. Right hip pain Normal post op pain. Anticipate this will be a STR admission with return to prior level of care.  2. Unstable gait PT and OT for strengthening and safe ambulation  3. Essential hypertension controlled  4. Hypothyroidism, unspecified hypothyroidism type compensated  5. Acute blood loss anemia Follow lab -CBC, future

## 2014-05-22 NOTE — Assessment & Plan Note (Signed)
Stable, continue Senna S II qhs. Prn Colace bid and MiraLax daily available to her.

## 2014-05-22 NOTE — Assessment & Plan Note (Signed)
IMPRESSION: Total hip prosthesis on the right which is well seated. Posttraumatic change on the left. Moderate narrowing left hip joint. No acute fracture or dislocation. 04/25/14 Ortho: WBAT, ambulation, no abduction exercise.  04/28/14 X-ray R hip/pelvis and lumbar spine: L3 90% compression fx indeterminatined dating. TRH in place. A question of fx involving the proximal portion of the femoral attachment of the prosthesis f/u Ortho.  05/16/14 Ortho: may increase weight bearinf to tolerance and assistive devices as needed.  Pain is managed with Tramadol 50mg  qam/q6h prn, Norco q6h prn,

## 2014-05-22 NOTE — Assessment & Plan Note (Signed)
Controlled, takes Labetalol 100mg  bid.

## 2014-05-22 NOTE — Assessment & Plan Note (Signed)
03/30/14 Hgb 7.3 04/04/14 Hgb 8.5 continue Fe bid.  05/04/14 Hgb 10.8 05/08/14 continue Fe bid.

## 2014-05-22 NOTE — Assessment & Plan Note (Signed)
Asymptomatic, continue Omeprazole 40mg  daily.

## 2014-05-22 NOTE — Assessment & Plan Note (Signed)
Takes Levothyroxine 114mcg po daily.04/04/14 TSH 1.55

## 2014-05-22 NOTE — Assessment & Plan Note (Signed)
04/04/14 K 5.0 05/04/14 K 3.0 05/08/14 Kcl 61meq daily.  --BMP

## 2014-05-23 LAB — BASIC METABOLIC PANEL
BUN: 12 mg/dL (ref 4–21)
Creatinine: 0.5 mg/dL (ref 0.5–1.1)
GLUCOSE: 85 mg/dL
POTASSIUM: 4 mmol/L (ref 3.4–5.3)
Sodium: 138 mmol/L (ref 137–147)

## 2014-05-24 ENCOUNTER — Other Ambulatory Visit: Payer: Self-pay | Admitting: Nurse Practitioner

## 2014-05-29 ENCOUNTER — Telehealth: Payer: Self-pay | Admitting: Physician Assistant

## 2014-05-29 ENCOUNTER — Other Ambulatory Visit: Payer: Self-pay | Admitting: Nurse Practitioner

## 2014-05-29 DIAGNOSIS — M25551 Pain in right hip: Secondary | ICD-10-CM

## 2014-06-19 ENCOUNTER — Encounter: Payer: Self-pay | Admitting: Nurse Practitioner

## 2014-06-19 ENCOUNTER — Non-Acute Institutional Stay (SKILLED_NURSING_FACILITY): Payer: Medicare Other | Admitting: Nurse Practitioner

## 2014-06-19 DIAGNOSIS — E039 Hypothyroidism, unspecified: Secondary | ICD-10-CM | POA: Diagnosis not present

## 2014-06-19 DIAGNOSIS — M9701XA Periprosthetic fracture around internal prosthetic right hip joint, initial encounter: Secondary | ICD-10-CM

## 2014-06-19 DIAGNOSIS — I10 Essential (primary) hypertension: Secondary | ICD-10-CM

## 2014-06-19 DIAGNOSIS — M25551 Pain in right hip: Secondary | ICD-10-CM | POA: Diagnosis not present

## 2014-06-19 DIAGNOSIS — K219 Gastro-esophageal reflux disease without esophagitis: Secondary | ICD-10-CM | POA: Diagnosis not present

## 2014-06-19 DIAGNOSIS — E876 Hypokalemia: Secondary | ICD-10-CM | POA: Diagnosis not present

## 2014-06-19 DIAGNOSIS — T84040A Periprosthetic fracture around internal prosthetic right hip joint, initial encounter: Secondary | ICD-10-CM

## 2014-06-19 DIAGNOSIS — Z96641 Presence of right artificial hip joint: Secondary | ICD-10-CM | POA: Diagnosis not present

## 2014-06-19 DIAGNOSIS — K5900 Constipation, unspecified: Secondary | ICD-10-CM | POA: Diagnosis not present

## 2014-06-19 DIAGNOSIS — D62 Acute posthemorrhagic anemia: Secondary | ICD-10-CM | POA: Diagnosis not present

## 2014-06-19 DIAGNOSIS — F0280 Dementia in other diseases classified elsewhere without behavioral disturbance: Secondary | ICD-10-CM

## 2014-06-19 DIAGNOSIS — G309 Alzheimer's disease, unspecified: Secondary | ICD-10-CM

## 2014-06-19 NOTE — Assessment & Plan Note (Signed)
04/04/14 K 5.0 05/04/14 K 3.0 05/08/14 Kcl 32meq daily.  05/23/14 K 4.0

## 2014-06-19 NOTE — Assessment & Plan Note (Signed)
Controlled, takes Labetalol 100mg  bid.

## 2014-06-19 NOTE — Assessment & Plan Note (Signed)
03/30/14 Hgb 7.3 04/04/14 Hgb 8.5 continue Fe bid.  05/04/14 Hgb 10.8 05/08/14 continue Fe bid.

## 2014-06-19 NOTE — Assessment & Plan Note (Signed)
Takes Levothyroxine 110mcg po daily.04/04/14 TSH 1.55

## 2014-06-19 NOTE — Assessment & Plan Note (Signed)
05/19/14 MMSE 18/30 Namenda.

## 2014-06-19 NOTE — Assessment & Plan Note (Signed)
IMPRESSION: Total hip prosthesis on the right which is well seated. Posttraumatic change on the left. Moderate narrowing left hip joint. No acute fracture or dislocation. 04/25/14 Ortho: WBAT, ambulation, no abduction exercise.  04/28/14 X-ray R hip/pelvis and lumbar spine: L3 90% compression fx indeterminatined dating. TRH in place. A question of fx involving the proximal portion of the femoral attachment of the prosthesis f/u Ortho.

## 2014-06-19 NOTE — Assessment & Plan Note (Signed)
Stable, continue Senna S II qhs. Prn Colace bid and MiraLax daily available to her.

## 2014-06-19 NOTE — Assessment & Plan Note (Signed)
IMPRESSION: Total hip prosthesis on the right which is well seated. Posttraumatic change on the left. Moderate narrowing left hip joint. No acute fracture or dislocation. 04/25/14 Ortho: WBAT, ambulation, no abduction exercise.  04/28/14 X-ray R hip/pelvis and lumbar spine: L3 90% compression fx indeterminatined dating. TRH in place. A question of fx involving the proximal portion of the femoral attachment of the prosthesis f/u Ortho.  05/16/14 Ortho: may increase weight bearinf to tolerance and assistive devices as needed.  Pain is managed with Tramadol 50mg  qam/q6h prn, Norco q6h prn,

## 2014-06-19 NOTE — Progress Notes (Signed)
Patient ID: Crystal Petersen, female   DOB: 25-Dec-1921, 81 y.o.   MRN: [id number]   Code Status: DNR  Allergies  Allergen Reactions  . Ace Inhibitors     Elevated potassium  . Fosamax [Alendronate Sodium]   . Metoclopramide Hcl Other (See Comments)    Pt. Not sure  . Reglan [Metoclopramide]   . Statins Other (See Comments)    myalgia  . Sulfa Antibiotics   . Vesicare [Solifenacin]   . Vicodin [Hydrocodone-Acetaminophen] Other (See Comments)    "felt spaced out"  . Sulfonamide Derivatives Rash    Chief Complaint  Patient presents with  . Medical Management of Chronic Issues    HPI: Patient is a 81 y.o. female seen in the SNF at [facility] today for evaluation of chronic medical conditions.  Problem List Items Addressed This Visit    Right hip pain    IMPRESSION: Total hip prosthesis on the right which is well seated. Posttraumatic change on the left. Moderate narrowing left hip joint. No acute fracture or dislocation. 04/25/14 Ortho: WBAT, ambulation, no abduction exercise.  04/28/14 X-ray R hip/pelvis and lumbar spine: L3 90% compression fx indeterminatined dating. TRH in place. A question of fx involving the proximal portion of the femoral attachment of the prosthesis f/u Ortho.  05/16/14 Ortho: may increase weight bearinf to tolerance and assistive devices as needed.  Pain is managed with Tramadol 50mg  qam/q6h prn, Norco q6h prn,        Periprosthetic fracture around internal prosthetic right hip joint    IMPRESSION: Total hip prosthesis on the right which is well seated. Posttraumatic change on the left. Moderate narrowing left hip joint. No acute fracture or dislocation. 04/25/14 Ortho: WBAT, ambulation, no abduction exercise.  04/28/14 X-ray R hip/pelvis and lumbar spine: L3 90% compression fx indeterminatined dating. TRH in place. A question of fx involving the proximal portion of the femoral attachment of the prosthesis f/u Ortho.         Hypothyroidism  - Primary    Takes Levothyroxine 163mcg po daily.04/04/14 TSH 1.55        Hypokalemia    04/04/14 K 5.0 05/04/14 K 3.0 05/08/14 Kcl 81meq daily.  05/23/14 K 4.0       GERD    Asymptomatic, continue Omeprazole 40mg  daily.        Essential hypertension    Controlled, takes Labetalol 100mg  bid.         Dementia of the Alzheimer's type    05/19/14 MMSE 18/30 Namenda.        Constipation    Stable, continue Senna S II qhs. Prn Colace bid and MiraLax daily available to her.         Acute blood loss anemia    03/30/14 Hgb 7.3 04/04/14 Hgb 8.5 continue Fe bid.  05/04/14 Hgb 10.8 05/08/14 continue Fe bid.            Review of Systems:  Review of Systems  Constitutional: Negative for fever, chills and diaphoresis.  HENT: Positive for hearing loss. Negative for congestion, ear discharge, ear pain, nosebleeds, sore throat and tinnitus.   Eyes: Negative for photophobia, pain, discharge and redness.  Respiratory: Negative for cough, shortness of breath, wheezing and stridor.   Cardiovascular: Negative for chest pain and leg swelling.  Gastrointestinal: Negative for nausea, vomiting, abdominal pain, diarrhea, constipation and blood in stool.  Endocrine: Negative for polydipsia.  Genitourinary: Positive for frequency and enuresis. Negative for dysuria, urgency, hematuria and flank pain.  Musculoskeletal: Positive for back pain. Negative for myalgias and neck pain.  Skin: Negative for rash.  Allergic/Immunologic: Negative for environmental allergies.  Neurological: Negative for dizziness, tremors, seizures, weakness and headaches.  Hematological: Does not bruise/bleed easily.  Psychiatric/Behavioral: Negative for suicidal ideas and hallucinations. The patient is nervous/anxious.      Past Medical History  Diagnosis Date  . Hypertension   . Nonspecific abnormal electrocardiogram (ECG) (EKG)   . Stricture and stenosis of esophagus   . Esophageal reflux   . Diverticulosis  of colon (without mention of hemorrhage)   . Benign neoplasm of colon   . Macular degeneration   . Osteoarthritis     right hip  . Other and unspecified hyperlipidemia   . HTN (hypertension)   . Shingles   . Cystocele   . Gastroparesis   . Skin cancer (melanoma)   . Hypothyroidism   . Cancer     pre-melanoma  . Hemorrhoids   . Primary osteoarthritis of right hip 03/27/2014  . Acute blood loss anemia 03/29/2014    03/30/14 Hgb 7.3 04/04/14 Hgb 8.5 continue Fe bid.  05/04/14 Hgb 10.8   . Dementia of the Alzheimer's type 05/22/2014    05/19/14 MMSE 18/30 Namenda.    . Diastolic dysfunction 9/67/8938  . Diverticulosis of large intestine 06/28/2004  . Essential hypertension 07/05/2013  . Hyperlipidemia 07/05/2013  . Internal hemorrhoids without mention of complication 01/12/7509  . Prediabetes 11/17/2013  . Stricture and stenosis of esophagus 10/27/2007  . Vitamin D deficiency 11/17/2013   Past Surgical History  Procedure Laterality Date  . Cholecystectomy    . Gallbladder surgery  2002  . Hemorrhoid surgery    . Appendectomy    . Thyroidectomy    . Left eye surgery    . Cataract extraction, bilateral    . Melenoma resection      right arm  . Bcc resection      legs/face  . Tonsillectomy    . Flexible sigmoidoscopy  05/12/2011    Procedure: FLEXIBLE SIGMOIDOSCOPY;  Surgeon: Inda Castle, MD;  Location: [facility];  Service: Endoscopy;  Laterality: N/A;  . Flexible sigmoidoscopy  09/10/2011    Procedure: FLEXIBLE SIGMOIDOSCOPY;  Surgeon: Inda Castle, MD;  Location: [facility];  Service: Endoscopy;  Laterality: N/A;  . Fracture surgery      left hip pin  . Flexible sigmoidoscopy N/A 08/16/2012    Procedure: FLEXIBLE SIGMOIDOSCOPY;  Surgeon: Inda Castle, MD;  Location: [facility];  Service: Endoscopy;  Laterality: N/A;  . Hemorrhoid banding N/A 08/16/2012    Procedure: HEMORRHOID BANDING;  Surgeon: Inda Castle, MD;  Location: [facility];  Service: Endoscopy;   Laterality: N/A;  . Total hip arthroplasty Right 03/27/2014    Procedure: TOTAL HIP ARTHROPLASTY ANTERIOR APPROACH;  Surgeon: Alta Corning, MD;  Location: [place];  Service: Orthopedics;  Laterality: Right;   Social History:   reports that she has never smoked. She has never used smokeless tobacco. She reports that she does not drink alcohol or use illicit drugs.  Family History  Problem Relation Age of Onset  . Esophageal cancer Brother   . Heart disease Father   . Hypertension Father   . Heart attack Father   . Colon cancer Neg Hx   . Malignant hyperthermia Neg Hx   . Diabetes Daughter     Medications: Patient's Medications  New Prescriptions   No medications on file  Previous Medications   CALCIUM CARBONATE (CAL-GEST ANTACID) 500  MG CHEWABLE TABLET    Chew 1 tablet by mouth as needed.    CHOLECALCIFEROL (VITAMIN D) 2000 UNITS TABLET    Take 4,000 Units by mouth daily.   DOCUSATE SODIUM (COLACE) 100 MG CAPSULE    Take 100 mg by mouth 2 (two) times daily as needed.    FERROUS SULFATE 325 (65 FE) MG TABLET    Take 1 tablet (325 mg total) by mouth 2 (two) times daily with a meal.   LABETALOL (NORMODYNE) 100 MG TABLET    TAKE 1 TABLET BY MOUTH TWICE A DAY   LEVOTHYROXINE (SYNTHROID) 100 MCG TABLET    Take 1 tablet (100 mcg total) by mouth daily.   MEMANTINE (NAMENDA XR) 28 MG CP24 24 HR CAPSULE    Take 28 mg by mouth daily.   MUPIROCIN OINTMENT (BACTROBAN) 2 %    daily.   OMEPRAZOLE (PRILOSEC) 40 MG CAPSULE    TAKE ONE CAPSULE BY MOUTH EVERY DAY FOR ACID REFLUX   POLYETHYLENE GLYCOL (MIRALAX / GLYCOLAX) PACKET    Take 17 g by mouth as needed for mild constipation.    POTASSIUM CHLORIDE SA (K-DUR,KLOR-CON) 20 MEQ TABLET    Take 20 mEq by mouth daily.   TRAMADOL (ULTRAM) 50 MG TABLET    Take 1 tablet (50 mg total) by mouth every 6 (six) hours as needed.   ZETIA 10 MG TABLET    Take 10 mg by mouth daily.  Modified Medications   No medications on file  Discontinued Medications   No  medications on file     Physical Exam: Physical Exam  Constitutional: She is oriented to person, place, and time. She appears well-developed and well-nourished. No distress.  HENT:  Head: Normocephalic and atraumatic.  Right Ear: External ear normal.  Left Ear: External ear normal.  Nose: Nose normal.  Mouth/Throat: Oropharynx is clear and moist. No oropharyngeal exudate.  Eyes: Conjunctivae and EOM are normal. Pupils are equal, round, and reactive to light. Right eye exhibits no discharge. Left eye exhibits no discharge. No scleral icterus.  Neck: Normal range of motion. Neck supple. No JVD present. No tracheal deviation present. No thyromegaly present.  Cardiovascular: Normal rate, regular rhythm, normal heart sounds and intact distal pulses.   No murmur heard. Pulmonary/Chest: Effort normal and breath sounds normal. No stridor. No respiratory distress. She has no wheezes. She has no rales. She exhibits no tenderness.  Abdominal: Soft. Bowel sounds are normal. She exhibits no distension and no mass. There is no tenderness. There is no rebound and no guarding.  Musculoskeletal: Normal range of motion. She exhibits tenderness. She exhibits no edema.  Right hip pain with ROM.  Lymphadenopathy:    She has no cervical adenopathy.  Neurological: She is alert and oriented to person, place, and time. She has normal reflexes. No cranial nerve deficit. She exhibits normal muscle tone. Coordination normal.  Skin: Skin is dry. No rash noted. She is not diaphoretic. No pallor.  Right hip surgical incision healed. R leg mild swelling.   Psychiatric: Her behavior is normal. Judgment and thought content normal. Her mood appears anxious. Her speech is not rapid and/or pressured, not delayed, not tangential and not slurred. Cognition and memory are impaired. She exhibits a depressed mood. She is communicative. She exhibits abnormal recent memory. She exhibits normal remote memory.    Filed Vitals:    06/19/14 1628  BP: 136/74  Pulse: 100  Temp: 98 F (36.7 C)  TempSrc: Tympanic  Resp: 18  Labs reviewed: Basic Metabolic Panel:  Recent Labs  10/06/13 1516  12/27/13 1428 02/22/14 1100  03/20/14 1300 03/28/14 0458 03/29/14 1040 04/04/14  05/09/14 05/18/14 05/23/14  NA 133*  --   --  139  --  139 138 132* 137  < > 140 137 138  K 4.9  --   --  3.7  --  3.3* 2.7* 3.5* 5.0  < > 3.9 4.1 4.0  CL 100  --   --  98  --  95* 97 96  --   --   --   --   --   CO2 29  --   --  28  --  27 22 23   --   --   --   --   --   GLUCOSE 105*  --   --  79  --  100* 103* 104*  --   --   --   --   --   BUN 24*  --   --  16  --  19 14 14 13   < > 12 12 12   CREATININE 0.82  --   --  0.80  < > 0.77 0.56 0.71 0.6  < > 0.5 0.7 0.5  CALCIUM 9.3  --   --  9.7  --  9.5 7.9* 8.4  --   --   --   --   --   MG 2.0  --   --  1.9  --   --   --   --   --   --   --   --   --   TSH 5.076*  < > 0.760 0.443  --   --   --   --  1.55  --   --   --   --   < > = values in this interval not displayed. Liver Function Tests:  Recent Labs  10/06/13 1516 02/22/14 1100 03/20/14 1300 04/04/14 05/04/14  AST 14 18 18 21 21   ALT 9 15 12 13 16   ALKPHOS 50 70 71 69 52  BILITOT 0.4 0.6 0.3  --   --   PROT 6.3 6.2 6.5  --   --   ALBUMIN 4.2 4.0 3.7  --   --    No results for input(s): LIPASE, AMYLASE in the last 8760 hours. No results for input(s): AMMONIA in the last 8760 hours. CBC:  Recent Labs  10/06/13 1516 02/22/14 1100 03/20/14 1300 03/28/14 0458 03/29/14 0644 03/30/14 0535 04/04/14 05/04/14  WBC 7.4 11.9* 9.6 7.5 10.4 8.4 12.1 5.7  NEUTROABS 4.8 9.0* 6.9  --   --   --   --   --   HGB 11.8* 12.3 11.9* 8.1* 7.9* 7.3* 8.5* 10.8*  HCT 35.4* 36.3 37.0 24.9* 23.3* 22.7* 26* 35*  MCV 81.6 81.4 83.7 83.6 84.1 82.2  --   --   PLT 346 390 373 217 203 234 597* 278   Lipid Panel:  Recent Labs  10/06/13 1516 02/22/14 1100  CHOL 218* 243*  HDL 93 98  LDLCALC 108* 120*  TRIG 84 124  CHOLHDL 2.3 2.5     Past Procedures:  03/27/14 X-ray Pelvis:  IMPRESSION: Total hip prosthesis on the right which is well seated. Posttraumatic change on the left. Moderate narrowing left hip joint. No acute fracture or dislocation.  Assessment/Plan Hypothyroidism Takes Levothyroxine 160mcg po daily.04/04/14 TSH 1.55     GERD Asymptomatic, continue Omeprazole 40mg  daily.  Dementia of the Alzheimer's type 05/19/14 MMSE 18/30 Namenda.     Periprosthetic fracture around internal prosthetic right hip joint IMPRESSION: Total hip prosthesis on the right which is well seated. Posttraumatic change on the left. Moderate narrowing left hip joint. No acute fracture or dislocation. 04/25/14 Ortho: WBAT, ambulation, no abduction exercise.  04/28/14 X-ray R hip/pelvis and lumbar spine: L3 90% compression fx indeterminatined dating. TRH in place. A question of fx involving the proximal portion of the femoral attachment of the prosthesis f/u Ortho.      Essential hypertension Controlled, takes Labetalol 100mg  bid.      Acute blood loss anemia 03/30/14 Hgb 7.3 04/04/14 Hgb 8.5 continue Fe bid.  05/04/14 Hgb 10.8 05/08/14 continue Fe bid.      Constipation Stable, continue Senna S II qhs. Prn Colace bid and MiraLax daily available to her.      Hypokalemia 04/04/14 K 5.0 05/04/14 K 3.0 05/08/14 Kcl 40meq daily.  05/23/14 K 4.0    Right hip pain IMPRESSION: Total hip prosthesis on the right which is well seated. Posttraumatic change on the left. Moderate narrowing left hip joint. No acute fracture or dislocation. 04/25/14 Ortho: WBAT, ambulation, no abduction exercise.  04/28/14 X-ray R hip/pelvis and lumbar spine: L3 90% compression fx indeterminatined dating. TRH in place. A question of fx involving the proximal portion of the femoral attachment of the prosthesis f/u Ortho.  05/16/14 Ortho: may increase weight bearinf to tolerance and assistive devices as needed.  Pain is managed with  Tramadol 50mg  qam/q6h prn, Norco q6h prn,       Family/ Staff Communication: observe the patient  Goals of Care: SNF  Labs/tests ordered: none

## 2014-06-19 NOTE — Assessment & Plan Note (Signed)
Asymptomatic, continue Omeprazole 40mg  daily.

## 2014-06-27 ENCOUNTER — Ambulatory Visit: Payer: Self-pay | Admitting: Internal Medicine

## 2014-07-06 ENCOUNTER — Ambulatory Visit (INDEPENDENT_AMBULATORY_CARE_PROVIDER_SITE_OTHER): Payer: Medicare Other | Admitting: Internal Medicine

## 2014-07-06 ENCOUNTER — Encounter: Payer: Self-pay | Admitting: Internal Medicine

## 2014-07-06 VITALS — BP 140/78 | HR 76 | Temp 97.3°F | Resp 16 | Ht 63.5 in | Wt 113.4 lb

## 2014-07-06 DIAGNOSIS — R7303 Prediabetes: Secondary | ICD-10-CM

## 2014-07-06 DIAGNOSIS — E559 Vitamin D deficiency, unspecified: Secondary | ICD-10-CM

## 2014-07-06 DIAGNOSIS — E039 Hypothyroidism, unspecified: Secondary | ICD-10-CM

## 2014-07-06 DIAGNOSIS — Z79899 Other long term (current) drug therapy: Secondary | ICD-10-CM

## 2014-07-06 DIAGNOSIS — R7309 Other abnormal glucose: Secondary | ICD-10-CM

## 2014-07-06 DIAGNOSIS — E782 Mixed hyperlipidemia: Secondary | ICD-10-CM

## 2014-07-06 DIAGNOSIS — I10 Essential (primary) hypertension: Secondary | ICD-10-CM

## 2014-07-06 NOTE — Patient Instructions (Signed)

## 2014-07-07 LAB — TSH: TSH: 1.563 u[IU]/mL (ref 0.350–4.500)

## 2014-07-07 LAB — BASIC METABOLIC PANEL WITH GFR
BUN: 20 mg/dL (ref 6–23)
CO2: 26 meq/L (ref 19–32)
CREATININE: 0.64 mg/dL (ref 0.50–1.10)
Calcium: 8.9 mg/dL (ref 8.4–10.5)
Chloride: 105 mEq/L (ref 96–112)
GFR, Est African American: >89 mL/min
GFR, Est Non African American: 78 mL/min
Glucose, Bld: 97 mg/dL (ref 70–99)
Potassium: 4.3 mEq/L (ref 3.5–5.3)
Sodium: 140 mEq/L (ref 135–145)

## 2014-07-07 LAB — CBC WITH DIFFERENTIAL/PLATELET
BASOS ABS: 0 10*3/uL (ref 0.0–0.1)
Basophils Relative: 0 % (ref 0–1)
Eosinophils Absolute: 0.3 10*3/uL (ref 0.0–0.7)
Eosinophils Relative: 4 % (ref 0–5)
HCT: 38.2 % (ref 36.0–46.0)
HEMOGLOBIN: 12.2 g/dL (ref 12.0–15.0)
Lymphocytes Relative: 22 % (ref 12–46)
Lymphs Abs: 1.6 10*3/uL (ref 0.7–4.0)
MCH: 28 pg (ref 26.0–34.0)
MCHC: 31.9 g/dL (ref 30.0–36.0)
MCV: 87.8 fL (ref 78.0–100.0)
MONOS PCT: 14 % — AB (ref 3–12)
MPV: 10.2 fL (ref 8.6–12.4)
Monocytes Absolute: 1 10*3/uL (ref 0.1–1.0)
NEUTROS ABS: 4.3 10*3/uL (ref 1.7–7.7)
NEUTROS PCT: 60 % (ref 43–77)
Platelets: 348 10*3/uL (ref 150–400)
RBC: 4.35 MIL/uL (ref 3.87–5.11)
RDW: 15 % (ref 11.5–15.5)
WBC: 7.1 10*3/uL (ref 4.0–10.5)

## 2014-07-07 LAB — LIPID PANEL
Cholesterol: 209 mg/dL — ABNORMAL HIGH (ref 0–200)
HDL: 66 mg/dL (ref >=46)
LDL Cholesterol: 111 mg/dL — ABNORMAL HIGH (ref 0–99)
TRIGLYCERIDES: 160 mg/dL — AB (ref <150)
Total CHOL/HDL Ratio: 3.2 Ratio
VLDL: 32 mg/dL (ref 0–40)

## 2014-07-07 LAB — MAGNESIUM: Magnesium: 1.9 mg/dL (ref 1.5–2.5)

## 2014-07-07 LAB — INSULIN, RANDOM: Insulin: 12.4 u[IU]/mL (ref 2.0–19.6)

## 2014-07-07 LAB — HEPATIC FUNCTION PANEL
ALBUMIN: 3.6 g/dL (ref 3.5–5.2)
ALT: 8 U/L (ref 0–35)
AST: 11 U/L (ref 0–37)
Alkaline Phosphatase: 68 U/L (ref 39–117)
Bilirubin, Direct: 0.1 mg/dL (ref 0.0–0.3)
Indirect Bilirubin: 0.2 mg/dL (ref 0.2–1.2)
TOTAL PROTEIN: 5.7 g/dL — AB (ref 6.0–8.3)
Total Bilirubin: 0.3 mg/dL (ref 0.2–1.2)

## 2014-07-07 LAB — HEMOGLOBIN A1C
HEMOGLOBIN A1C: 5.8 % — AB (ref <5.7)
Mean Plasma Glucose: 120 mg/dL — ABNORMAL HIGH (ref <117)

## 2014-07-07 LAB — VITAMIN D 25 HYDROXY (VIT D DEFICIENCY, FRACTURES): VIT D 25 HYDROXY: 50 ng/mL (ref 30–100)

## 2014-07-08 NOTE — Progress Notes (Signed)
Patient ID: Crystal Petersen, female   DOB: 06-06-1921, 81 y.o.   MRN: [id number]   This very nice 81 y.o. WWF presents for 3 month follow up with Hypertension, Hyperlipidemia, Pre-Diabetes, SDAT and Vitamin D Deficiency. Patient brought by daughter who re-iterates mother's ST recall span is about 5-10 min.    Patient is treated for HTN & BP has been controlled at home. Today's BP: 140/78 mmHg. Patient has had no complaints of any cardiac type chest pain, palpitations, dyspnea/orthopnea/PND, dizziness, claudication, or dependent edema.   Hyperlipidemia is controlled with diet & meds. Patient has hx/o statin intolerance & is on zetia & denies myalgias or other med SE's. Last Lipids were near goal - Total Chol 209*; HDL 66; LDL 111; Trig 160 on 07/06/2014.   Also, the patient has history of PreDiabetes and has had no symptoms of reactive hypoglycemia, diabetic polys, paresthesias or visual blurring.  Last A1c was  5.8% on  07/06/2014.   Further, the patient also has history of Vitamin D Deficiency and supplements vitamin D without any suspected side-effects. Last vitamin D was  50 on  07/06/2014.  Medication Sig  . calcium carbonate  500 MG tab Chew 1 tablet by mouth as needed.   Marland Kitchen VITAMIN D 2000 UNITS tablet Take 4,000 Units by mouth daily.  Marland Kitchen docusate  (COLACE) 100 MG capsule Take 100 mg by mouth 2 (two) times daily as needed.   . ferrous sulfate 325 (65 FE) MG tablet Take 1 tablet (325 mg total) by mouth 2 (two) times daily with a meal.  . labetalol ( 100 MG tablet TAKE 1 TABLET BY MOUTH TWICE A DAY  . levothyroxine  100 MCG tablet Take 1 tablet (100 mcg total) by mouth daily.  . memantine (NAMENDA XR) 28 MG  24 hr cap Take 28 mg by mouth daily.  . mupirocin oint (BACTROBAN) 2 % daily.  Marland Kitchen omeprazole  40 MG capsule TAKE ONE CAP EVERY DAY  . polyethylene glycol (MIRALAX) packet Take 17 g by mouth as needed for mild constipation.   . potassium chloride SA (K-DUR) 20 MEQ tab Take 20 mEq by mouth  daily.  . traMADol (ULTRAM) 50 MG tablet Take 1 tablet (50 mg total) by mouth every 6 (six) hours as needed.  Marland Kitchen ZETIA 10 MG tablet Take 10 mg by mouth daily.   Allergies  Allergen Reactions  . Ace Inhibitors     Elevated potassium  . Fosamax [Alendronate Sodium]   . Metoclopramide Hcl Other (See Comments)    Pt. Not sure  . Reglan [Metoclopramide]   . Statins Other (See Comments)    myalgia  . Sulfa Antibiotics   . Vesicare [Solifenacin]   . Vicodin [Hydrocodone-Acetaminophen] Other (See Comments)    "felt spaced out"  . Sulfonamide Derivatives Rash   PMHx:   Past Medical History  Diagnosis Date  . Hypertension   . Nonspecific abnormal electrocardiogram (ECG) (EKG)   . Stricture and stenosis of esophagus   . Esophageal reflux   . Diverticulosis of colon (without mention of hemorrhage)   . Benign neoplasm of colon   . Macular degeneration   . Osteoarthritis     right hip  . Other and unspecified hyperlipidemia   . HTN (hypertension)   . Shingles   . Cystocele   . Gastroparesis   . Skin cancer (melanoma)   . Hypothyroidism   . Cancer     pre-melanoma  . Hemorrhoids   . Primary osteoarthritis of right  hip 03/27/2014  . Acute blood loss anemia 03/29/2014    03/30/14 Hgb 7.3 04/04/14 Hgb 8.5 continue Fe bid.  05/04/14 Hgb 10.8   . Dementia of the Alzheimer's type 05/22/2014    05/19/14 MMSE 18/30 Namenda.    . Diastolic dysfunction 07/21/8117  . Diverticulosis of large intestine 06/28/2004  . Essential hypertension 07/05/2013  . Hyperlipidemia 07/05/2013  . Internal hemorrhoids without mention of complication 14/10/8293  . Prediabetes 11/17/2013  . Stricture and stenosis of esophagus 10/27/2007  . Vitamin D deficiency 11/17/2013   Immunization History  Administered Date(s) Administered  . Influenza, High Dose Seasonal PF 12/27/2013  . Influenza-Unspecified 01/12/2013  . Pneumococcal-Unspecified 07/01/2012  . Td 05/27/2004  . Tdap 02/21/2012  . Zoster 04/15/2007   Past  Surgical History  Procedure Laterality Date  . Cholecystectomy    . Gallbladder surgery  2002  . Hemorrhoid surgery    . Appendectomy    . Thyroidectomy    . Left eye surgery    . Cataract extraction, bilateral    . Melenoma resection      right arm  . Bcc resection      legs/face  . Tonsillectomy    . Flexible sigmoidoscopy  05/12/2011    Procedure: FLEXIBLE SIGMOIDOSCOPY;  Surgeon: Inda Castle, MD;  Location: [facility];  Service: Endoscopy;  Laterality: N/A;  . Flexible sigmoidoscopy  09/10/2011    Procedure: FLEXIBLE SIGMOIDOSCOPY;  Surgeon: Inda Castle, MD;  Location: [facility];  Service: Endoscopy;  Laterality: N/A;  . Fracture surgery      left hip pin  . Flexible sigmoidoscopy N/A 08/16/2012    Procedure: FLEXIBLE SIGMOIDOSCOPY;  Surgeon: Inda Castle, MD;  Location: [facility];  Service: Endoscopy;  Laterality: N/A;  . Hemorrhoid banding N/A 08/16/2012    Procedure: HEMORRHOID BANDING;  Surgeon: Inda Castle, MD;  Location: [facility];  Service: Endoscopy;  Laterality: N/A;  . Total hip arthroplasty Right 03/27/2014    Procedure: TOTAL HIP ARTHROPLASTY ANTERIOR APPROACH;  Surgeon: Alta Corning, MD;  Location: [place];  Service: Orthopedics;  Laterality: Right;   FHx:    Reviewed / unchanged  SHx:    Reviewed / unchanged  Systems Review:  Constitutional: Denies fever, chills, wt changes, headaches, insomnia, fatigue, night sweats, change in appetite. Eyes: Denies redness, blurred vision, diplopia, discharge, itchy, watery eyes.  ENT: Denies discharge, congestion, post nasal drip, epistaxis, sore throat, earache, hearing loss, dental pain, tinnitus, vertigo, sinus pain, snoring.  CV: Denies chest pain, palpitations, irregular heartbeat, syncope, dyspnea, diaphoresis, orthopnea, PND, claudication or edema. Respiratory: denies cough, dyspnea, DOE, pleurisy, hoarseness, laryngitis, wheezing.  Gastrointestinal: Denies dysphagia, odynophagia, heartburn, reflux,  water brash, abdominal pain or cramps, nausea, vomiting, bloating, diarrhea, constipation, hematemesis, melena, hematochezia  or hemorrhoids. Genitourinary: Denies dysuria, frequency, urgency, nocturia, hesitancy, discharge, hematuria or flank pain. Musculoskeletal: Denies arthralgias, myalgias, stiffness, jt. swelling, pain, limping or strain/sprain.  Skin: Denies pruritus, rash, hives, warts, acne, eczema or change in skin lesion(s). Neuro: No weakness, tremor, incoordination, spasms, paresthesia or pain. Psychiatric: Denies confusion, memory loss or sensory loss. Endo: Denies change in weight, skin or hair change.  Heme/Lymph: No excessive bleeding, bruising or enlarged lymph nodes.  Physical Exam  BP 140/78   Pulse 76  Temp 97.3 F  Resp 16  Ht 5' 3.5"  Wt 113 lb 6.4 oz     BMI 19.77   Appears well nourished and in no distress. Eyes: PERRLA, EOMs, conjunctiva no swelling or erythema. Sinuses:  No frontal/maxillary tenderness ENT/Mouth: EAC's clear, TM's nl w/o erythema, bulging. Nares clear w/o erythema, swelling, exudates. Oropharynx clear without erythema or exudates. Oral hygiene is good. Tongue normal, non obstructing. Hearing intact.  Neck: Supple. Thyroid nl. Car 2+/2+ without bruits, nodes or JVD. Chest: Respirations nl with BS clear & equal w/o rales, rhonchi, wheezing or stridor.  Cor: Heart sounds normal w/ regular rate and rhythm without sig. murmurs, gallops, clicks, or rubs. Peripheral pulses normal and equal  without edema.  Abdomen: Soft & bowel sounds normal. Non-tender w/o guarding, rebound, hernias, masses, or organomegaly.  Lymphatics: Unremarkable.  Musculoskeletal: Full ROM all peripheral extremities, generalized decrease in muscle power, tone  And bulk consistent with age and deconditioning, and sl brpoad based gait.  Skin: Warm, dry without exposed rashes, lesions or ecchymosis apparent.  Neuro: Cranial nerves intact, reflexes equal bilaterally. Sensory-motor  testing grossly intact. Tendon reflexes grossly intact.  Pysch: Alert & oriented x 3.  Insight and judgement nl & appropriate. No ideations.  Assessment and Plan:  1. Essential hypertension   2. Hyperlipidemia  -hx/o Statin Intolerant - Lipid panel  3. Prediabetes  - Hemoglobin A1c - Insulin, random  4. Vitamin D deficiency  - Vit D  25 hydroxy (rtn osteoporosis monitoring)  5. Hypothyroidism, unspecified hypothyroidism type  - TSH  6. Medication management - CBC with Differential/Platelet - BASIC METABOLIC PANEL WITH GFR - Hepatic function panel - Magnesium   Recommended regular exercise, BP monitoring, weight control, and discussed med and SE's. Recommended labs to assess and monitor clinical status. Further disposition pending results of labs. Over 30 minutes of exam, counseling, chart review was performed

## 2014-07-19 ENCOUNTER — Non-Acute Institutional Stay (SKILLED_NURSING_FACILITY): Payer: Medicare Other | Admitting: Nurse Practitioner

## 2014-07-19 ENCOUNTER — Encounter: Payer: Self-pay | Admitting: Nurse Practitioner

## 2014-07-19 DIAGNOSIS — E876 Hypokalemia: Secondary | ICD-10-CM

## 2014-07-19 DIAGNOSIS — Z96641 Presence of right artificial hip joint: Secondary | ICD-10-CM

## 2014-07-19 DIAGNOSIS — G309 Alzheimer's disease, unspecified: Secondary | ICD-10-CM | POA: Diagnosis not present

## 2014-07-19 DIAGNOSIS — M9701XA Periprosthetic fracture around internal prosthetic right hip joint, initial encounter: Secondary | ICD-10-CM

## 2014-07-19 DIAGNOSIS — D509 Iron deficiency anemia, unspecified: Secondary | ICD-10-CM | POA: Diagnosis not present

## 2014-07-19 DIAGNOSIS — F0280 Dementia in other diseases classified elsewhere without behavioral disturbance: Secondary | ICD-10-CM

## 2014-07-19 DIAGNOSIS — K5900 Constipation, unspecified: Secondary | ICD-10-CM | POA: Insufficient documentation

## 2014-07-19 DIAGNOSIS — I10 Essential (primary) hypertension: Secondary | ICD-10-CM | POA: Diagnosis not present

## 2014-07-19 DIAGNOSIS — T84040A Periprosthetic fracture around internal prosthetic right hip joint, initial encounter: Secondary | ICD-10-CM | POA: Diagnosis not present

## 2014-07-19 DIAGNOSIS — E039 Hypothyroidism, unspecified: Secondary | ICD-10-CM

## 2014-07-19 DIAGNOSIS — K219 Gastro-esophageal reflux disease without esophagitis: Secondary | ICD-10-CM

## 2014-07-19 NOTE — Assessment & Plan Note (Signed)
Takes Levothyroxine 179mcg po daily.04/04/14 TSH 1.55

## 2014-07-19 NOTE — Assessment & Plan Note (Signed)
Asymptomatic, continue Omeprazole 40mg  daily.

## 2014-07-19 NOTE — Assessment & Plan Note (Signed)
Stable, takes Senokot S II nightly.

## 2014-07-19 NOTE — Assessment & Plan Note (Signed)
05/19/14 MMSE 18/30 Namenda.

## 2014-07-19 NOTE — Assessment & Plan Note (Signed)
Controlled, takes Labetalol 100mg  bid. F/u CMP

## 2014-07-19 NOTE — Assessment & Plan Note (Addendum)
Takes Fe  325mg  bid--update CBC 07/20/14 Hgb 11.8-reduce Fe to daily, f/u CBC one month.

## 2014-07-19 NOTE — Assessment & Plan Note (Signed)
IMPRESSION: Total hip prosthesis on the right which is well seated. Posttraumatic change on the left. Moderate narrowing left hip joint. No acute fracture or dislocation. 04/25/14 Ortho: WBAT, ambulation, no abduction exercise.  04/28/14 X-ray R hip/pelvis and lumbar spine: L3 90% compression fx indeterminatined dating. TRH in place. A question of fx involving the proximal portion of the femoral attachment of the prosthesis f/u Ortho.  Pain is managed prn Tramadol 50mg  q6h

## 2014-07-19 NOTE — Assessment & Plan Note (Signed)
Kcl 57meq daily-update CMP

## 2014-07-19 NOTE — Progress Notes (Signed)
Patient ID: Crystal Petersen, female   DOB: 01/21/1922, 81 y.o.   MRN: [id number]   Code Status: DNR  Allergies  Allergen Reactions  . Ace Inhibitors     Elevated potassium  . Fosamax [Alendronate Sodium]   . Metoclopramide Hcl Other (See Comments)    Pt. Not sure  . Reglan [Metoclopramide]   . Statins Other (See Comments)    myalgia  . Sulfa Antibiotics   . Vesicare [Solifenacin]   . Vicodin [Hydrocodone-Acetaminophen] Other (See Comments)    "felt spaced out"  . Sulfonamide Derivatives Rash    Chief Complaint  Patient presents with  . Medical Management of Chronic Issues    HPI: Patient is a 81 y.o. female seen in the SNF at [facility] today for evaluation of chronic medical conditions.  Problem List Items Addressed This Visit    Constipation    Stable, takes Senokot S II nightly.       Dementia of the Alzheimer's type    05/19/14 MMSE 18/30 Namenda.        Essential hypertension    Controlled, takes Labetalol 100mg  bid. F/u CMP        GERD    Asymptomatic, continue Omeprazole 40mg  daily.         Hypokalemia    Kcl 30meq daily-update CMP      Hypothyroidism    Takes Levothyroxine 153mcg po daily.04/04/14 TSH 1.55      IDA (iron deficiency anemia) - Primary    Takes Fe  325mg  bid--update CBC 07/20/14 Hgb 11.8-reduce Fe to daily, f/u CBC one month.       Periprosthetic fracture around internal prosthetic right hip joint    IMPRESSION: Total hip prosthesis on the right which is well seated. Posttraumatic change on the left. Moderate narrowing left hip joint. No acute fracture or dislocation. 04/25/14 Ortho: WBAT, ambulation, no abduction exercise.  04/28/14 X-ray R hip/pelvis and lumbar spine: L3 90% compression fx indeterminatined dating. TRH in place. A question of fx involving the proximal portion of the femoral attachment of the prosthesis f/u Ortho.  Pain is managed prn Tramadol 50mg  q6h         Review of Systems:  Review of Systems    Constitutional: Negative for fever, chills and diaphoresis.  HENT: Positive for hearing loss. Negative for congestion, ear discharge, ear pain, nosebleeds, sore throat and tinnitus.   Eyes: Negative for photophobia, pain, discharge and redness.  Respiratory: Negative for cough, shortness of breath, wheezing and stridor.   Cardiovascular: Negative for chest pain and leg swelling.  Gastrointestinal: Negative for nausea, vomiting, abdominal pain, diarrhea, constipation and blood in stool.  Endocrine: Negative for polydipsia.  Genitourinary: Positive for frequency and enuresis. Negative for dysuria, urgency, hematuria and flank pain.  Musculoskeletal: Positive for back pain. Negative for myalgias and neck pain.  Skin: Negative for rash.  Allergic/Immunologic: Negative for environmental allergies.  Neurological: Negative for dizziness, tremors, seizures, weakness and headaches.  Hematological: Does not bruise/bleed easily.  Psychiatric/Behavioral: Negative for suicidal ideas and hallucinations. The patient is nervous/anxious.      Past Medical History  Diagnosis Date  . Hypertension   . Nonspecific abnormal electrocardiogram (ECG) (EKG)   . Stricture and stenosis of esophagus   . Esophageal reflux   . Diverticulosis of colon (without mention of hemorrhage)   . Benign neoplasm of colon   . Macular degeneration   . Osteoarthritis     right hip  . Other and unspecified hyperlipidemia   .  HTN (hypertension)   . Shingles   . Cystocele   . Gastroparesis   . Skin cancer (melanoma)   . Hypothyroidism   . Cancer     pre-melanoma  . Hemorrhoids   . Primary osteoarthritis of right hip 03/27/2014  . Acute blood loss anemia 03/29/2014    03/30/14 Hgb 7.3 04/04/14 Hgb 8.5 continue Fe bid.  05/04/14 Hgb 10.8   . Dementia of the Alzheimer's type 05/22/2014    05/19/14 MMSE 18/30 Namenda.    . Diastolic dysfunction 8/00/3491  . Diverticulosis of large intestine 06/28/2004  . Essential hypertension  07/05/2013  . Hyperlipidemia 07/05/2013  . Internal hemorrhoids without mention of complication 79/04/5054  . Prediabetes 11/17/2013  . Stricture and stenosis of esophagus 10/27/2007  . Vitamin D deficiency 11/17/2013   Past Surgical History  Procedure Laterality Date  . Cholecystectomy    . Gallbladder surgery  2002  . Hemorrhoid surgery    . Appendectomy    . Thyroidectomy    . Left eye surgery    . Cataract extraction, bilateral    . Melenoma resection      right arm  . Bcc resection      legs/face  . Tonsillectomy    . Flexible sigmoidoscopy  05/12/2011    Procedure: FLEXIBLE SIGMOIDOSCOPY;  Surgeon: Inda Castle, MD;  Location: [facility];  Service: Endoscopy;  Laterality: N/A;  . Flexible sigmoidoscopy  09/10/2011    Procedure: FLEXIBLE SIGMOIDOSCOPY;  Surgeon: Inda Castle, MD;  Location: [facility];  Service: Endoscopy;  Laterality: N/A;  . Fracture surgery      left hip pin  . Flexible sigmoidoscopy N/A 08/16/2012    Procedure: FLEXIBLE SIGMOIDOSCOPY;  Surgeon: Inda Castle, MD;  Location: [facility];  Service: Endoscopy;  Laterality: N/A;  . Hemorrhoid banding N/A 08/16/2012    Procedure: HEMORRHOID BANDING;  Surgeon: Inda Castle, MD;  Location: [facility];  Service: Endoscopy;  Laterality: N/A;  . Total hip arthroplasty Right 03/27/2014    Procedure: TOTAL HIP ARTHROPLASTY ANTERIOR APPROACH;  Surgeon: Alta Corning, MD;  Location: [place];  Service: Orthopedics;  Laterality: Right;   Social History:   reports that she has never smoked. She has never used smokeless tobacco. She reports that she does not drink alcohol or use illicit drugs.  Family History  Problem Relation Age of Onset  . Esophageal cancer Brother   . Heart disease Father   . Hypertension Father   . Heart attack Father   . Colon cancer Neg Hx   . Malignant hyperthermia Neg Hx   . Diabetes Daughter     Medications: Patient's Medications  New Prescriptions   No medications on file    Previous Medications   CALCIUM CARBONATE (CAL-GEST ANTACID) 500 MG CHEWABLE TABLET    Chew 1 tablet by mouth as needed.    CHOLECALCIFEROL (VITAMIN D) 2000 UNITS TABLET    Take 4,000 Units by mouth daily.   DOCUSATE SODIUM (COLACE) 100 MG CAPSULE    Take 100 mg by mouth 2 (two) times daily as needed.    FERROUS SULFATE 325 (65 FE) MG TABLET    Take 1 tablet (325 mg total) by mouth 2 (two) times daily with a meal.   LABETALOL (NORMODYNE) 100 MG TABLET    TAKE 1 TABLET BY MOUTH TWICE A DAY   LEVOTHYROXINE (SYNTHROID) 100 MCG TABLET    Take 1 tablet (100 mcg total) by mouth daily.   MEMANTINE (NAMENDA XR) 28 MG  CP24 24 HR CAPSULE    Take 28 mg by mouth daily.   MUPIROCIN OINTMENT (BACTROBAN) 2 %    daily.   OMEPRAZOLE (PRILOSEC) 40 MG CAPSULE    TAKE ONE CAPSULE BY MOUTH EVERY DAY FOR ACID REFLUX   POLYETHYLENE GLYCOL (MIRALAX / GLYCOLAX) PACKET    Take 17 g by mouth as needed for mild constipation.    POTASSIUM CHLORIDE SA (K-DUR,KLOR-CON) 20 MEQ TABLET    Take 20 mEq by mouth daily.   TRAMADOL (ULTRAM) 50 MG TABLET    Take 1 tablet (50 mg total) by mouth every 6 (six) hours as needed.   ZETIA 10 MG TABLET    Take 10 mg by mouth daily.  Modified Medications   No medications on file  Discontinued Medications   No medications on file     Physical Exam: Physical Exam  Constitutional: She is oriented to person, place, and time. She appears well-developed and well-nourished. No distress.  HENT:  Head: Normocephalic and atraumatic.  Right Ear: External ear normal.  Left Ear: External ear normal.  Nose: Nose normal.  Mouth/Throat: Oropharynx is clear and moist. No oropharyngeal exudate.  Eyes: Conjunctivae and EOM are normal. Pupils are equal, round, and reactive to light. Right eye exhibits no discharge. Left eye exhibits no discharge. No scleral icterus.  Neck: Normal range of motion. Neck supple. No JVD present. No tracheal deviation present. No thyromegaly present.  Cardiovascular:  Normal rate, regular rhythm, normal heart sounds and intact distal pulses.   No murmur heard. Pulmonary/Chest: Effort normal and breath sounds normal. No stridor. No respiratory distress. She has no wheezes. She has no rales. She exhibits no tenderness.  Abdominal: Soft. Bowel sounds are normal. She exhibits no distension and no mass. There is no tenderness. There is no rebound and no guarding.  Musculoskeletal: Normal range of motion. She exhibits tenderness. She exhibits no edema.  Right hip pain with ROM.  Lymphadenopathy:    She has no cervical adenopathy.  Neurological: She is alert and oriented to person, place, and time. She has normal reflexes. No cranial nerve deficit. She exhibits normal muscle tone. Coordination normal.  Skin: Skin is dry. No rash noted. She is not diaphoretic. No pallor.  Right hip surgical incision healed. R leg mild swelling.   Psychiatric: Her behavior is normal. Judgment and thought content normal. Her mood appears anxious. Her speech is not rapid and/or pressured, not delayed, not tangential and not slurred. Cognition and memory are impaired. She exhibits a depressed mood. She is communicative. She exhibits abnormal recent memory. She exhibits normal remote memory.    Filed Vitals:   07/19/14 1409  BP: 130/82  Pulse: 76  Temp: 98 F (36.7 C)  TempSrc: Tympanic  Resp: 18      Labs reviewed: Basic Metabolic Panel:  Recent Labs  10/06/13 1516  02/22/14 1100  03/28/14 0458 03/29/14 1040 04/04/14  05/23/14 07/06/14 1726 07/20/14  NA 133*  --  139  < > 138 132* 137  < > 138 140 140  K 4.9  --  3.7  < > 2.7* 3.5* 5.0  < > 4.0 4.3 4.0  CL 100  --  98  < > 97 96  --   --   --  105  --   CO2 29  --  28  < > 22 23  --   --   --  26  --   GLUCOSE 105*  --  79  < >  103* 104*  --   --   --  97  --   BUN 24*  --  16  < > 14 14 13   < > 12 20 19   CREATININE 0.82  --  0.80  < > 0.56 0.71 0.6  < > 0.5 0.64 0.6  CALCIUM 9.3  --  9.7  < > 7.9* 8.4  --   --   --   8.9  --   MG 2.0  --  1.9  --   --   --   --   --   --  1.9  --   TSH 5.076*  < > 0.443  --   --   --  1.55  --   --  1.563  --   < > = values in this interval not displayed. Liver Function Tests:  Recent Labs  02/22/14 1100 03/20/14 1300  05/04/14 07/06/14 1726 07/20/14  AST 18 18  < > 21 11 13   ALT 15 12  < > 16 8 8   ALKPHOS 70 71  < > 52 68 54  BILITOT 0.6 0.3  --   --  0.3  --   PROT 6.2 6.5  --   --  5.7*  --   ALBUMIN 4.0 3.7  --   --  3.6  --   < > = values in this interval not displayed. No results for input(s): LIPASE, AMYLASE in the last 8760 hours. No results for input(s): AMMONIA in the last 8760 hours. CBC:  Recent Labs  02/22/14 1100 03/20/14 1300  03/29/14 0644 03/30/14 0535  05/04/14 07/06/14 1726 07/20/14  WBC 11.9* 9.6  < > 10.4 8.4  < > 5.7 7.1 7.0  NEUTROABS 9.0* 6.9  --   --   --   --   --  4.3  --   HGB 12.3 11.9*  < > 7.9* 7.3*  < > 10.8* 12.2 11.8*  HCT 36.3 37.0  < > 23.3* 22.7*  < > 35* 38.2 36  MCV 81.4 83.7  < > 84.1 82.2  --   --  87.8  --   PLT 390 373  < > 203 234  < > 278 348 263  < > = values in this interval not displayed. Lipid Panel:  Recent Labs  10/06/13 1516 02/22/14 1100 07/06/14 1726  CHOL 218* 243* 209*  HDL 93 98 66  LDLCALC 108* 120* 111*  TRIG 84 124 160*  CHOLHDL 2.3 2.5 3.2    Past Procedures:  03/27/14 X-ray Pelvis:  IMPRESSION: Total hip prosthesis on the right which is well seated. Posttraumatic change on the left. Moderate narrowing left hip joint. No acute fracture or dislocation.  Assessment/Plan Hypothyroidism Takes Levothyroxine 156mcg po daily.04/04/14 TSH 1.55   GERD Asymptomatic, continue Omeprazole 40mg  daily.      Dementia of the Alzheimer's type 05/19/14 MMSE 18/30 Namenda.     Hypokalemia Kcl 85meq daily-update CMP   Essential hypertension Controlled, takes Labetalol 100mg  bid. F/u CMP     IDA (iron deficiency anemia) Takes Fe  325mg  bid--update CBC 07/20/14 Hgb 11.8-reduce  Fe to daily, f/u CBC one month.    Constipation Stable, takes Senokot S II nightly.    Periprosthetic fracture around internal prosthetic right hip joint IMPRESSION: Total hip prosthesis on the right which is well seated. Posttraumatic change on the left. Moderate narrowing left hip joint. No acute fracture or dislocation. 04/25/14 Ortho: WBAT, ambulation, no abduction exercise.  04/28/14 X-ray R hip/pelvis and lumbar spine: L3 90% compression fx indeterminatined dating. TRH in place. A question of fx involving the proximal portion of the femoral attachment of the prosthesis f/u Ortho.  Pain is managed prn Tramadol 50mg  q6h     Family/ Staff Communication: observe the patient  Goals of Care: SNF  Labs/tests ordered: CBC and CMP

## 2014-07-20 LAB — BASIC METABOLIC PANEL
BUN: 19 mg/dL (ref 4–21)
Creatinine: 0.6 mg/dL (ref 0.5–1.1)
Glucose: 74 mg/dL
POTASSIUM: 4 mmol/L (ref 3.4–5.3)
SODIUM: 140 mmol/L (ref 137–147)

## 2014-07-20 LAB — HEPATIC FUNCTION PANEL
ALT: 8 U/L (ref 7–35)
AST: 13 U/L (ref 13–35)
Alkaline Phosphatase: 54 U/L (ref 25–125)
BILIRUBIN, TOTAL: 0.5 mg/dL

## 2014-07-20 LAB — CBC AND DIFFERENTIAL
HEMATOCRIT: 36 % (ref 36–46)
Hemoglobin: 11.8 g/dL — AB (ref 12.0–16.0)
PLATELETS: 263 10*3/uL (ref 150–399)
WBC: 7 10*3/mL

## 2014-08-22 LAB — CBC AND DIFFERENTIAL
HCT: 36 % (ref 36–46)
Hemoglobin: 11.7 g/dL — AB (ref 12.0–16.0)
PLATELETS: 265 10*3/uL (ref 150–399)
WBC: 6.9 10*3/mL

## 2014-08-23 ENCOUNTER — Other Ambulatory Visit: Payer: Self-pay | Admitting: Nurse Practitioner

## 2014-08-23 DIAGNOSIS — D509 Iron deficiency anemia, unspecified: Secondary | ICD-10-CM

## 2014-08-30 ENCOUNTER — Non-Acute Institutional Stay (SKILLED_NURSING_FACILITY): Payer: Medicare Other | Admitting: Nurse Practitioner

## 2014-08-30 ENCOUNTER — Encounter: Payer: Self-pay | Admitting: Nurse Practitioner

## 2014-08-30 DIAGNOSIS — E876 Hypokalemia: Secondary | ICD-10-CM

## 2014-08-30 DIAGNOSIS — M9701XA Periprosthetic fracture around internal prosthetic right hip joint, initial encounter: Secondary | ICD-10-CM

## 2014-08-30 DIAGNOSIS — D509 Iron deficiency anemia, unspecified: Secondary | ICD-10-CM

## 2014-08-30 DIAGNOSIS — Z96641 Presence of right artificial hip joint: Secondary | ICD-10-CM | POA: Diagnosis not present

## 2014-08-30 DIAGNOSIS — I10 Essential (primary) hypertension: Secondary | ICD-10-CM | POA: Diagnosis not present

## 2014-08-30 DIAGNOSIS — E039 Hypothyroidism, unspecified: Secondary | ICD-10-CM | POA: Diagnosis not present

## 2014-08-30 DIAGNOSIS — N8110 Cystocele, unspecified: Secondary | ICD-10-CM | POA: Diagnosis not present

## 2014-08-30 DIAGNOSIS — G309 Alzheimer's disease, unspecified: Secondary | ICD-10-CM

## 2014-08-30 DIAGNOSIS — K219 Gastro-esophageal reflux disease without esophagitis: Secondary | ICD-10-CM | POA: Diagnosis not present

## 2014-08-30 DIAGNOSIS — IMO0002 Reserved for concepts with insufficient information to code with codable children: Secondary | ICD-10-CM

## 2014-08-30 DIAGNOSIS — K5900 Constipation, unspecified: Secondary | ICD-10-CM | POA: Diagnosis not present

## 2014-08-30 DIAGNOSIS — E782 Mixed hyperlipidemia: Secondary | ICD-10-CM | POA: Diagnosis not present

## 2014-08-30 DIAGNOSIS — T84040A Periprosthetic fracture around internal prosthetic right hip joint, initial encounter: Secondary | ICD-10-CM

## 2014-08-30 DIAGNOSIS — F0280 Dementia in other diseases classified elsewhere without behavioral disturbance: Secondary | ICD-10-CM

## 2014-08-30 NOTE — Assessment & Plan Note (Signed)
05/19/14 MMSE 18/30 Namenda.

## 2014-08-30 NOTE — Assessment & Plan Note (Signed)
08/23/14 Pharm: reduce Omeprazole from 40mg  daily to 20mg  daily.

## 2014-08-30 NOTE — Progress Notes (Signed)
Patient ID: Crystal Petersen, female   DOB: [birth year], 81 y.o.   MRN: [id number]   Code Status: DNR  Allergies  Allergen Reactions  . Ace Inhibitors     Elevated potassium  . Fosamax [Alendronate Sodium]   . Metoclopramide Hcl Other (See Comments)    Pt. Not sure  . Reglan [Metoclopramide]   . Statins Other (See Comments)    myalgia  . Sulfa Antibiotics   . Vesicare [Solifenacin]   . Vicodin [Hydrocodone-Acetaminophen] Other (See Comments)    "felt spaced out"  . Sulfonamide Derivatives Rash    Chief Complaint  Patient presents with  . Medical Management of Chronic Issues    HPI: Patient is a 81 y.o. female seen in the SNF at [facility] today for evaluation of chronic medical conditions.  Problem List Items Addressed This Visit    None      Review of Systems:  Review of Systems  Constitutional: Negative for fever, chills and diaphoresis.  HENT: Positive for hearing loss. Negative for congestion, ear discharge, ear pain, nosebleeds, sore throat and tinnitus.   Eyes: Negative for photophobia, pain, discharge and redness.  Respiratory: Negative for cough, shortness of breath, wheezing and stridor.   Cardiovascular: Negative for chest pain and leg swelling.  Gastrointestinal: Negative for nausea, vomiting, abdominal pain, diarrhea, constipation and blood in stool.  Endocrine: Negative for polydipsia.  Genitourinary: Positive for frequency and enuresis. Negative for dysuria, urgency, hematuria and flank pain.  Musculoskeletal: Positive for back pain. Negative for myalgias and neck pain.  Skin: Negative for rash.  Allergic/Immunologic: Negative for environmental allergies.  Neurological: Negative for dizziness, tremors, seizures, weakness and headaches.  Hematological: Does not bruise/bleed easily.  Psychiatric/Behavioral: Negative for suicidal ideas and hallucinations. The patient is nervous/anxious.      Past Medical History  Diagnosis Date  . Hypertension    . Nonspecific abnormal electrocardiogram (ECG) (EKG)   . Stricture and stenosis of esophagus   . Esophageal reflux   . Diverticulosis of colon (without mention of hemorrhage)   . Benign neoplasm of colon   . Macular degeneration   . Osteoarthritis     right hip  . Other and unspecified hyperlipidemia   . HTN (hypertension)   . Shingles   . Cystocele   . Gastroparesis   . Skin cancer (melanoma)   . Hypothyroidism   . Cancer     pre-melanoma  . Hemorrhoids   . Primary osteoarthritis of right hip 03/27/2014  . Acute blood loss anemia 03/29/2014    03/30/14 Hgb 7.3 04/04/14 Hgb 8.5 continue Fe bid.  05/04/14 Hgb 10.8   . Dementia of the Alzheimer's type 05/22/2014    05/19/14 MMSE 18/30 Namenda.    . Diastolic dysfunction 0/27/7412  . Diverticulosis of large intestine 06/28/2004  . Essential hypertension 07/05/2013  . Hyperlipidemia 07/05/2013  . Internal hemorrhoids without mention of complication 87/11/6765  . Prediabetes 11/17/2013  . Stricture and stenosis of esophagus 10/27/2007  . Vitamin D deficiency 11/17/2013   Past Surgical History  Procedure Laterality Date  . Cholecystectomy    . Gallbladder surgery  2002  . Hemorrhoid surgery    . Appendectomy    . Thyroidectomy    . Left eye surgery    . Cataract extraction, bilateral    . Melenoma resection      right arm  . Bcc resection      legs/face  . Tonsillectomy    . Flexible sigmoidoscopy  05/12/2011  Procedure: FLEXIBLE SIGMOIDOSCOPY;  Surgeon: Inda Castle, MD;  Location: [facility];  Service: Endoscopy;  Laterality: N/A;  . Flexible sigmoidoscopy  09/10/2011    Procedure: FLEXIBLE SIGMOIDOSCOPY;  Surgeon: Inda Castle, MD;  Location: [facility];  Service: Endoscopy;  Laterality: N/A;  . Fracture surgery      left hip pin  . Flexible sigmoidoscopy N/A 08/16/2012    Procedure: FLEXIBLE SIGMOIDOSCOPY;  Surgeon: Inda Castle, MD;  Location: [facility];  Service: Endoscopy;  Laterality: N/A;  . Hemorrhoid  banding N/A 08/16/2012    Procedure: HEMORRHOID BANDING;  Surgeon: Inda Castle, MD;  Location: [facility];  Service: Endoscopy;  Laterality: N/A;  . Total hip arthroplasty Right 03/27/2014    Procedure: TOTAL HIP ARTHROPLASTY ANTERIOR APPROACH;  Surgeon: Alta Corning, MD;  Location: [place];  Service: Orthopedics;  Laterality: Right;   Social History:   reports that she has never smoked. She has never used smokeless tobacco. She reports that she does not drink alcohol or use illicit drugs.  Family History  Problem Relation Age of Onset  . Esophageal cancer Brother   . Heart disease Father   . Hypertension Father   . Heart attack Father   . Colon cancer Neg Hx   . Malignant hyperthermia Neg Hx   . Diabetes Daughter     Medications: Patient's Medications  New Prescriptions   No medications on file  Previous Medications   CALCIUM CARBONATE (CAL-GEST ANTACID) 500 MG CHEWABLE TABLET    Chew 1 tablet by mouth as needed.    CHOLECALCIFEROL (VITAMIN D) 2000 UNITS TABLET    Take 4,000 Units by mouth daily.   DOCUSATE SODIUM (COLACE) 100 MG CAPSULE    Take 100 mg by mouth 2 (two) times daily as needed.    FERROUS SULFATE 325 (65 FE) MG TABLET    Take 1 tablet (325 mg total) by mouth 2 (two) times daily with a meal.   LABETALOL (NORMODYNE) 100 MG TABLET    TAKE 1 TABLET BY MOUTH TWICE A DAY   LEVOTHYROXINE (SYNTHROID) 100 MCG TABLET    Take 1 tablet (100 mcg total) by mouth daily.   MEMANTINE (NAMENDA XR) 28 MG CP24 24 HR CAPSULE    Take 28 mg by mouth daily.   MUPIROCIN OINTMENT (BACTROBAN) 2 %    daily.   OMEPRAZOLE (PRILOSEC) 40 MG CAPSULE    TAKE ONE CAPSULE BY MOUTH EVERY DAY FOR ACID REFLUX   POLYETHYLENE GLYCOL (MIRALAX / GLYCOLAX) PACKET    Take 17 g by mouth as needed for mild constipation.    POTASSIUM CHLORIDE SA (K-DUR,KLOR-CON) 20 MEQ TABLET    Take 20 mEq by mouth daily.   TRAMADOL (ULTRAM) 50 MG TABLET    Take 1 tablet (50 mg total) by mouth every 6 (six) hours as needed.    ZETIA 10 MG TABLET    Take 10 mg by mouth daily.  Modified Medications   No medications on file  Discontinued Medications   No medications on file     Physical Exam: Physical Exam  Constitutional: She is oriented to person, place, and time. She appears well-developed and well-nourished. No distress.  HENT:  Head: Normocephalic and atraumatic.  Right Ear: External ear normal.  Left Ear: External ear normal.  Nose: Nose normal.  Mouth/Throat: Oropharynx is clear and moist. No oropharyngeal exudate.  Eyes: Conjunctivae and EOM are normal. Pupils are equal, round, and reactive to light. Right eye exhibits no discharge. Left  eye exhibits no discharge. No scleral icterus.  Neck: Normal range of motion. Neck supple. No JVD present. No tracheal deviation present. No thyromegaly present.  Cardiovascular: Normal rate, regular rhythm, normal heart sounds and intact distal pulses.   No murmur heard. Pulmonary/Chest: Effort normal and breath sounds normal. No stridor. No respiratory distress. She has no wheezes. She has no rales. She exhibits no tenderness.  Abdominal: Soft. Bowel sounds are normal. She exhibits no distension and no mass. There is no tenderness. There is no rebound and no guarding.  Musculoskeletal: Normal range of motion. She exhibits tenderness. She exhibits no edema.  Right hip pain with ROM.  Lymphadenopathy:    She has no cervical adenopathy.  Neurological: She is alert and oriented to person, place, and time. She has normal reflexes. No cranial nerve deficit. She exhibits normal muscle tone. Coordination normal.  Skin: Skin is dry. No rash noted. She is not diaphoretic. No pallor.  Right hip surgical incision healed. R leg mild swelling.   Psychiatric: Her behavior is normal. Judgment and thought content normal. Her mood appears anxious. Her speech is not rapid and/or pressured, not delayed, not tangential and not slurred. Cognition and memory are impaired. She exhibits a  depressed mood. She is communicative. She exhibits abnormal recent memory. She exhibits normal remote memory.    Filed Vitals:   08/30/14 1508  BP: 118/76  Pulse: 74  Temp: 97.9 F (36.6 C)  TempSrc: Tympanic  Resp: 18      Labs reviewed: Basic Metabolic Panel:  Recent Labs  10/06/13 1516  02/22/14 1100  03/28/14 0458 03/29/14 1040 04/04/14  05/23/14 07/06/14 1726 07/20/14  NA 133*  --  139  < > 138 132* 137  < > 138 140 140  K 4.9  --  3.7  < > 2.7* 3.5* 5.0  < > 4.0 4.3 4.0  CL 100  --  98  < > 97 96  --   --   --  105  --   CO2 29  --  28  < > 22 23  --   --   --  26  --   GLUCOSE 105*  --  79  < > 103* 104*  --   --   --  97  --   BUN 24*  --  16  < > 14 14 13   < > 12 20 19   CREATININE 0.82  --  0.80  < > 0.56 0.71 0.6  < > 0.5 0.64 0.6  CALCIUM 9.3  --  9.7  < > 7.9* 8.4  --   --   --  8.9  --   MG 2.0  --  1.9  --   --   --   --   --   --  1.9  --   TSH 5.076*  < > 0.443  --   --   --  1.55  --   --  1.563  --   < > = values in this interval not displayed. Liver Function Tests:  Recent Labs  02/22/14 1100 03/20/14 1300  05/04/14 07/06/14 1726 07/20/14  AST 18 18  < > 21 11 13   ALT 15 12  < > 16 8 8   ALKPHOS 70 71  < > 52 68 54  BILITOT 0.6 0.3  --   --  0.3  --   PROT 6.2 6.5  --   --  5.7*  --  ALBUMIN 4.0 3.7  --   --  3.6  --   < > = values in this interval not displayed. No results for input(s): LIPASE, AMYLASE in the last 8760 hours. No results for input(s): AMMONIA in the last 8760 hours. CBC:  Recent Labs  02/22/14 1100 03/20/14 1300  03/29/14 0644 03/30/14 0535  07/06/14 1726 07/20/14 08/22/14  WBC 11.9* 9.6  < > 10.4 8.4  < > 7.1 7.0 6.9  NEUTROABS 9.0* 6.9  --   --   --   --  4.3  --   --   HGB 12.3 11.9*  < > 7.9* 7.3*  < > 12.2 11.8* 11.7*  HCT 36.3 37.0  < > 23.3* 22.7*  < > 38.2 36 36  MCV 81.4 83.7  < > 84.1 82.2  --  87.8  --   --   PLT 390 373  < > 203 234  < > 348 263 265  < > = values in this interval not displayed. Lipid  Panel:  Recent Labs  10/06/13 1516 02/22/14 1100 07/06/14 1726  CHOL 218* 243* 209*  HDL 93 98 66  LDLCALC 108* 120* 111*  TRIG 84 124 160*  CHOLHDL 2.3 2.5 3.2    Past Procedures:  03/27/14 X-ray Pelvis:  IMPRESSION: Total hip prosthesis on the right which is well seated. Posttraumatic change on the left. Moderate narrowing left hip joint. No acute fracture or dislocation.  Assessment/Plan No problem-specific assessment & plan notes found for this encounter.   Family/ Staff Communication: observe the patient  Goals of Care: SNF  Labs/tests ordered: none

## 2014-08-30 NOTE — Assessment & Plan Note (Signed)
08/23/14 dc Zetia-LDL 111

## 2014-08-30 NOTE — Assessment & Plan Note (Signed)
Chronic, no bleeding or ulceration.

## 2014-08-30 NOTE — Assessment & Plan Note (Signed)
Per Ortho 04/11/14-greater trochanteric periprosthetic fracture right-dc ASA and f/u 2 weeks. 50% WB with walker.  04/13/14 Ortho ASA 325mg  bid x 2 more weeks.  --healed.

## 2014-08-30 NOTE — Assessment & Plan Note (Signed)
Takes Levothyroxine 110mcg po daily.04/04/14 TSH 1.55

## 2014-08-30 NOTE — Assessment & Plan Note (Signed)
Stable, takes Senokot S II nightly.

## 2014-08-30 NOTE — Assessment & Plan Note (Signed)
Takes Fe  325mg  bid--update CBC 07/20/14 Hgb 11.8-reduce Fe to daily, 08/22/14 Hgb 11.7--08/29/14 dc Iron and f/u CBC in one month.

## 2014-08-30 NOTE — Assessment & Plan Note (Signed)
Kcl 96meq daily, 07/20/14 K 4.0

## 2014-08-30 NOTE — Assessment & Plan Note (Signed)
Controlled, takes Labetalol 100mg  bid.

## 2014-09-12 ENCOUNTER — Encounter: Payer: Self-pay | Admitting: Gastroenterology

## 2014-09-27 ENCOUNTER — Non-Acute Institutional Stay (SKILLED_NURSING_FACILITY): Payer: Medicare Other | Admitting: Nurse Practitioner

## 2014-09-27 ENCOUNTER — Encounter: Payer: Self-pay | Admitting: Nurse Practitioner

## 2014-09-27 DIAGNOSIS — G309 Alzheimer's disease, unspecified: Secondary | ICD-10-CM

## 2014-09-27 DIAGNOSIS — N8110 Cystocele, unspecified: Secondary | ICD-10-CM | POA: Diagnosis not present

## 2014-09-27 DIAGNOSIS — E876 Hypokalemia: Secondary | ICD-10-CM

## 2014-09-27 DIAGNOSIS — F0280 Dementia in other diseases classified elsewhere without behavioral disturbance: Secondary | ICD-10-CM

## 2014-09-27 DIAGNOSIS — Z96641 Presence of right artificial hip joint: Secondary | ICD-10-CM

## 2014-09-27 DIAGNOSIS — I10 Essential (primary) hypertension: Secondary | ICD-10-CM

## 2014-09-27 DIAGNOSIS — E782 Mixed hyperlipidemia: Secondary | ICD-10-CM

## 2014-09-27 DIAGNOSIS — K5900 Constipation, unspecified: Secondary | ICD-10-CM

## 2014-09-27 DIAGNOSIS — E039 Hypothyroidism, unspecified: Secondary | ICD-10-CM | POA: Diagnosis not present

## 2014-09-27 DIAGNOSIS — M9701XA Periprosthetic fracture around internal prosthetic right hip joint, initial encounter: Secondary | ICD-10-CM

## 2014-09-27 DIAGNOSIS — D509 Iron deficiency anemia, unspecified: Secondary | ICD-10-CM

## 2014-09-27 DIAGNOSIS — K219 Gastro-esophageal reflux disease without esophagitis: Secondary | ICD-10-CM

## 2014-09-27 DIAGNOSIS — T84040A Periprosthetic fracture around internal prosthetic right hip joint, initial encounter: Secondary | ICD-10-CM | POA: Diagnosis not present

## 2014-09-27 DIAGNOSIS — IMO0002 Reserved for concepts with insufficient information to code with codable children: Secondary | ICD-10-CM

## 2014-09-27 NOTE — Assessment & Plan Note (Signed)
05/19/14 MMSE 18/30 Namenda.

## 2014-09-27 NOTE — Progress Notes (Signed)
Patient ID: Crystal Petersen, female   DOB: 10-24-1921, 81 y.o.   MRN: [id number]   Code Status: DNR  Allergies  Allergen Reactions  . Ace Inhibitors     Elevated potassium  . Fosamax [Alendronate Sodium]   . Metoclopramide Hcl Other (See Comments)    Pt. Not sure  . Reglan [Metoclopramide]   . Statins Other (See Comments)    myalgia  . Sulfa Antibiotics   . Vesicare [Solifenacin]   . Vicodin [Hydrocodone-Acetaminophen] Other (See Comments)    "felt spaced out"  . Sulfonamide Derivatives Rash    Chief Complaint  Patient presents with  . Medical Management of Chronic Issues    HPI: Patient is a 81 y.o. female seen in the SNF at [facility] today for evaluation of chronic medical conditions.  Problem List Items Addressed This Visit    GERD - Primary    08/23/14 Pharm: reduce Omeprazole from 40mg  daily to 20mg  daily.  --stable.         Hyperlipidemia    08/23/14 dc Zetia-LDL 111       Hypothyroidism    Takes Levothyroxine 186mcg po daily.04/04/14 TSH 1.55 --update TSH         Essential hypertension    Controlled, takes Labetalol 100mg  bid.       Periprosthetic fracture around internal prosthetic right hip joint    Per Ortho 04/11/14-greater trochanteric periprosthetic fracture right --healed.         Dementia of the Alzheimer's type    05/19/14 MMSE 18/30 Namenda.         Cystocele    Chronic, no bleeding or ulceration.        Hypokalemia    Kcl 71meq daily, 07/20/14 K 4.0 --update CMP       IDA (iron deficiency anemia)    Takes Fe  325mg  bid--update CBC 07/20/14 Hgb 11.8-reduce Fe to daily, 08/22/14 Hgb 11.7--08/29/14 dc Iron and f/u CBC in one month.      Constipation    Stable, takes Senokot S II nightly.            Review of Systems:  Review of Systems  Constitutional: Negative for fever, chills and diaphoresis.  HENT: Positive for hearing loss. Negative for congestion, ear discharge, ear pain, nosebleeds, sore throat and  tinnitus.   Eyes: Negative for photophobia, pain, discharge and redness.  Respiratory: Negative for cough, shortness of breath, wheezing and stridor.   Cardiovascular: Negative for chest pain and leg swelling.  Gastrointestinal: Negative for nausea, vomiting, abdominal pain, diarrhea, constipation and blood in stool.  Endocrine: Negative for polydipsia.  Genitourinary: Positive for frequency and enuresis. Negative for dysuria, urgency, hematuria and flank pain.  Musculoskeletal: Positive for back pain. Negative for myalgias and neck pain.  Skin: Negative for rash.  Allergic/Immunologic: Negative for environmental allergies.  Neurological: Negative for dizziness, tremors, seizures, weakness and headaches.  Hematological: Does not bruise/bleed easily.  Psychiatric/Behavioral: Negative for suicidal ideas and hallucinations. The patient is nervous/anxious.      Past Medical History  Diagnosis Date  . Hypertension   . Nonspecific abnormal electrocardiogram (ECG) (EKG)   . Stricture and stenosis of esophagus   . Esophageal reflux   . Diverticulosis of colon (without mention of hemorrhage)   . Benign neoplasm of colon   . Macular degeneration   . Osteoarthritis     right hip  . Other and unspecified hyperlipidemia   . HTN (hypertension)   . Shingles   . Cystocele   .  Gastroparesis   . Skin cancer (melanoma)   . Hypothyroidism   . Cancer     pre-melanoma  . Hemorrhoids   . Primary osteoarthritis of right hip 03/27/2014  . Acute blood loss anemia 03/29/2014    03/30/14 Hgb 7.3 04/04/14 Hgb 8.5 continue Fe bid.  05/04/14 Hgb 10.8   . Dementia of the Alzheimer's type 05/22/2014    05/19/14 MMSE 18/30 Namenda.    . Diastolic dysfunction 07/24/8784  . Diverticulosis of large intestine 06/28/2004  . Essential hypertension 07/05/2013  . Hyperlipidemia 07/05/2013  . Internal hemorrhoids without mention of complication 76/10/2092  . Prediabetes 11/17/2013  . Stricture and stenosis of esophagus  10/27/2007  . Vitamin D deficiency 11/17/2013   Past Surgical History  Procedure Laterality Date  . Cholecystectomy    . Gallbladder surgery  2002  . Hemorrhoid surgery    . Appendectomy    . Thyroidectomy    . Left eye surgery    . Cataract extraction, bilateral    . Melenoma resection      right arm  . Bcc resection      legs/face  . Tonsillectomy    . Flexible sigmoidoscopy  05/12/2011    Procedure: FLEXIBLE SIGMOIDOSCOPY;  Surgeon: Inda Castle, MD;  Location: [facility];  Service: Endoscopy;  Laterality: N/A;  . Flexible sigmoidoscopy  09/10/2011    Procedure: FLEXIBLE SIGMOIDOSCOPY;  Surgeon: Inda Castle, MD;  Location: [facility];  Service: Endoscopy;  Laterality: N/A;  . Fracture surgery      left hip pin  . Flexible sigmoidoscopy N/A 08/16/2012    Procedure: FLEXIBLE SIGMOIDOSCOPY;  Surgeon: Inda Castle, MD;  Location: [facility];  Service: Endoscopy;  Laterality: N/A;  . Hemorrhoid banding N/A 08/16/2012    Procedure: HEMORRHOID BANDING;  Surgeon: Inda Castle, MD;  Location: [facility];  Service: Endoscopy;  Laterality: N/A;  . Total hip arthroplasty Right 03/27/2014    Procedure: TOTAL HIP ARTHROPLASTY ANTERIOR APPROACH;  Surgeon: Alta Corning, MD;  Location: [place];  Service: Orthopedics;  Laterality: Right;   Social History:   reports that she has never smoked. She has never used smokeless tobacco. She reports that she does not drink alcohol or use illicit drugs.  Family History  Problem Relation Age of Onset  . Esophageal cancer Brother   . Heart disease Father   . Hypertension Father   . Heart attack Father   . Colon cancer Neg Hx   . Malignant hyperthermia Neg Hx   . Diabetes Daughter     Medications: Patient's Medications  New Prescriptions   No medications on file  Previous Medications   CALCIUM CARBONATE (CAL-GEST ANTACID) 500 MG CHEWABLE TABLET    Chew 1 tablet by mouth as needed.    CHOLECALCIFEROL (VITAMIN D) 2000 UNITS TABLET     Take 4,000 Units by mouth daily.   DOCUSATE SODIUM (COLACE) 100 MG CAPSULE    Take 100 mg by mouth 2 (two) times daily as needed.    FERROUS SULFATE 325 (65 FE) MG TABLET    Take 1 tablet (325 mg total) by mouth 2 (two) times daily with a meal.   LABETALOL (NORMODYNE) 100 MG TABLET    TAKE 1 TABLET BY MOUTH TWICE A DAY   LEVOTHYROXINE (SYNTHROID) 100 MCG TABLET    Take 1 tablet (100 mcg total) by mouth daily.   MEMANTINE (NAMENDA XR) 28 MG CP24 24 HR CAPSULE    Take 28 mg by mouth daily.  MUPIROCIN OINTMENT (BACTROBAN) 2 %    daily.   OMEPRAZOLE (PRILOSEC) 40 MG CAPSULE    TAKE ONE CAPSULE BY MOUTH EVERY DAY FOR ACID REFLUX   POLYETHYLENE GLYCOL (MIRALAX / GLYCOLAX) PACKET    Take 17 g by mouth as needed for mild constipation.    POTASSIUM CHLORIDE SA (K-DUR,KLOR-CON) 20 MEQ TABLET    Take 20 mEq by mouth daily.   TRAMADOL (ULTRAM) 50 MG TABLET    Take 1 tablet (50 mg total) by mouth every 6 (six) hours as needed.   ZETIA 10 MG TABLET    Take 10 mg by mouth daily.  Modified Medications   No medications on file  Discontinued Medications   No medications on file     Physical Exam: Physical Exam  Constitutional: She is oriented to person, place, and time. She appears well-developed and well-nourished. No distress.  HENT:  Head: Normocephalic and atraumatic.  Right Ear: External ear normal.  Left Ear: External ear normal.  Nose: Nose normal.  Mouth/Throat: Oropharynx is clear and moist. No oropharyngeal exudate.  Eyes: Conjunctivae and EOM are normal. Pupils are equal, round, and reactive to light. Right eye exhibits no discharge. Left eye exhibits no discharge. No scleral icterus.  Neck: Normal range of motion. Neck supple. No JVD present. No tracheal deviation present. No thyromegaly present.  Cardiovascular: Normal rate, regular rhythm, normal heart sounds and intact distal pulses.   No murmur heard. Pulmonary/Chest: Effort normal and breath sounds normal. No stridor. No respiratory  distress. She has no wheezes. She has no rales. She exhibits no tenderness.  Abdominal: Soft. Bowel sounds are normal. She exhibits no distension and no mass. There is no tenderness. There is no rebound and no guarding.  Musculoskeletal: Normal range of motion. She exhibits tenderness. She exhibits no edema.  Right hip pain with ROM.  Lymphadenopathy:    She has no cervical adenopathy.  Neurological: She is alert and oriented to person, place, and time. She has normal reflexes. No cranial nerve deficit. She exhibits normal muscle tone. Coordination normal.  Skin: Skin is dry. No rash noted. She is not diaphoretic. No pallor.  Right hip surgical incision healed. R leg mild swelling.   Psychiatric: Her behavior is normal. Judgment and thought content normal. Her mood appears anxious. Her speech is not rapid and/or pressured, not delayed, not tangential and not slurred. Cognition and memory are impaired. She exhibits a depressed mood. She is communicative. She exhibits abnormal recent memory. She exhibits normal remote memory.    Filed Vitals:   09/27/14 1206  BP: 154/82  Pulse: 68  Temp: 98.2 F (36.8 C)  TempSrc: Tympanic  Resp: 16      Labs reviewed: Basic Metabolic Panel:  Recent Labs  10/06/13 1516  02/22/14 1100  03/28/14 0458 03/29/14 1040 04/04/14  05/23/14 07/06/14 1726 07/20/14  NA 133*  --  139  < > 138 132* 137  < > 138 140 140  K 4.9  --  3.7  < > 2.7* 3.5* 5.0  < > 4.0 4.3 4.0  CL 100  --  98  < > 97 96  --   --   --  105  --   CO2 29  --  28  < > 22 23  --   --   --  26  --   GLUCOSE 105*  --  79  < > 103* 104*  --   --   --  97  --  BUN 24*  --  16  < > 14 14 13   < > 12 20 19   CREATININE 0.82  --  0.80  < > 0.56 0.71 0.6  < > 0.5 0.64 0.6  CALCIUM 9.3  --  9.7  < > 7.9* 8.4  --   --   --  8.9  --   MG 2.0  --  1.9  --   --   --   --   --   --  1.9  --   TSH 5.076*  < > 0.443  --   --   --  1.55  --   --  1.563  --   < > = values in this interval not  displayed. Liver Function Tests:  Recent Labs  02/22/14 1100 03/20/14 1300  05/04/14 07/06/14 1726 07/20/14  AST 18 18  < > 21 11 13   ALT 15 12  < > 16 8 8   ALKPHOS 70 71  < > 52 68 54  BILITOT 0.6 0.3  --   --  0.3  --   PROT 6.2 6.5  --   --  5.7*  --   ALBUMIN 4.0 3.7  --   --  3.6  --   < > = values in this interval not displayed. No results for input(s): LIPASE, AMYLASE in the last 8760 hours. No results for input(s): AMMONIA in the last 8760 hours. CBC:  Recent Labs  02/22/14 1100 03/20/14 1300  03/29/14 0644 03/30/14 0535  07/06/14 1726 07/20/14 08/22/14  WBC 11.9* 9.6  < > 10.4 8.4  < > 7.1 7.0 6.9  NEUTROABS 9.0* 6.9  --   --   --   --  4.3  --   --   HGB 12.3 11.9*  < > 7.9* 7.3*  < > 12.2 11.8* 11.7*  HCT 36.3 37.0  < > 23.3* 22.7*  < > 38.2 36 36  MCV 81.4 83.7  < > 84.1 82.2  --  87.8  --   --   PLT 390 373  < > 203 234  < > 348 263 265  < > = values in this interval not displayed. Lipid Panel:  Recent Labs  10/06/13 1516 02/22/14 1100 07/06/14 1726  CHOL 218* 243* 209*  HDL 93 98 66  LDLCALC 108* 120* 111*  TRIG 84 124 160*  CHOLHDL 2.3 2.5 3.2    Past Procedures:  03/27/14 X-ray Pelvis:  IMPRESSION: Total hip prosthesis on the right which is well seated. Posttraumatic change on the left. Moderate narrowing left hip joint. No acute fracture or dislocation.  Assessment/Plan GERD 08/23/14 Pharm: reduce Omeprazole from 40mg  daily to 20mg  daily.  --stable.     Hyperlipidemia 08/23/14 dc Zetia-LDL 111   Hypothyroidism Takes Levothyroxine 138mcg po daily.04/04/14 TSH 1.55 --update TSH     Essential hypertension Controlled, takes Labetalol 100mg  bid.   Periprosthetic fracture around internal prosthetic right hip joint Per Ortho 04/11/14-greater trochanteric periprosthetic fracture right --healed.     Dementia of the Alzheimer's type 05/19/14 MMSE 18/30 Namenda.     Cystocele Chronic, no bleeding or ulceration.     Hypokalemia Kcl 44meq daily, 07/20/14 K 4.0 --update CMP   IDA (iron deficiency anemia) Takes Fe  325mg  bid--update CBC 07/20/14 Hgb 11.8-reduce Fe to daily, 08/22/14 Hgb 11.7--08/29/14 dc Iron and f/u CBC in one month.  Constipation Stable, takes Senokot S II nightly.       Family/ Staff Communication: observe  the patient  Goals of Care: SNF  Labs/tests ordered: CBC, CMP, TSH

## 2014-09-27 NOTE — Assessment & Plan Note (Signed)
08/23/14 dc Zetia-LDL 111

## 2014-09-27 NOTE — Assessment & Plan Note (Signed)
08/23/14 Pharm: reduce Omeprazole from 40mg  daily to 20mg  daily.  --stable.

## 2014-09-27 NOTE — Assessment & Plan Note (Signed)
Takes Levothyroxine 140mcg po daily.04/04/14 TSH 1.55 --update TSH

## 2014-09-27 NOTE — Assessment & Plan Note (Signed)
Chronic, no bleeding or ulceration.

## 2014-09-27 NOTE — Assessment & Plan Note (Signed)
Kcl 83meq daily, 07/20/14 K 4.0 --update CMP

## 2014-09-27 NOTE — Assessment & Plan Note (Signed)
Per Ortho 04/11/14-greater trochanteric periprosthetic fracture right --healed.

## 2014-09-27 NOTE — Assessment & Plan Note (Signed)
Controlled, takes Labetalol 100mg  bid.

## 2014-09-27 NOTE — Assessment & Plan Note (Signed)
Takes Fe  325mg  bid--update CBC 07/20/14 Hgb 11.8-reduce Fe to daily, 08/22/14 Hgb 11.7--08/29/14 dc Iron and f/u CBC in one month.

## 2014-09-27 NOTE — Assessment & Plan Note (Signed)
Stable, takes Senokot S II nightly.

## 2014-09-28 LAB — BASIC METABOLIC PANEL
BUN: 16 mg/dL (ref 4–21)
Creatinine: 0.6 mg/dL (ref 0.5–1.1)
GLUCOSE: 77 mg/dL
Potassium: 3.8 mmol/L (ref 3.4–5.3)
Sodium: 142 mmol/L (ref 137–147)

## 2014-09-28 LAB — HEPATIC FUNCTION PANEL
ALT: 8 U/L (ref 7–35)
AST: 12 U/L — AB (ref 13–35)
Alkaline Phosphatase: 55 U/L (ref 25–125)
Bilirubin, Total: 0.5 mg/dL

## 2014-09-28 LAB — TSH: TSH: 2.19 u[IU]/mL (ref 0.41–5.90)

## 2014-10-02 ENCOUNTER — Other Ambulatory Visit: Payer: Self-pay | Admitting: Nurse Practitioner

## 2014-10-02 DIAGNOSIS — E039 Hypothyroidism, unspecified: Secondary | ICD-10-CM

## 2014-10-02 DIAGNOSIS — E876 Hypokalemia: Secondary | ICD-10-CM

## 2014-10-05 LAB — CBC AND DIFFERENTIAL
HCT: 37 % (ref 36–46)
Hemoglobin: 12.4 g/dL (ref 12.0–16.0)
Platelets: 238 10*3/uL (ref 150–399)
WBC: 6.5 10^3/mL

## 2014-10-11 ENCOUNTER — Other Ambulatory Visit: Payer: Self-pay | Admitting: Nurse Practitioner

## 2014-10-11 DIAGNOSIS — D509 Iron deficiency anemia, unspecified: Secondary | ICD-10-CM

## 2015-01-01 ENCOUNTER — Non-Acute Institutional Stay (SKILLED_NURSING_FACILITY): Payer: Medicare Other | Admitting: Nurse Practitioner

## 2015-01-01 ENCOUNTER — Encounter: Payer: Self-pay | Admitting: Nurse Practitioner

## 2015-01-01 DIAGNOSIS — G309 Alzheimer's disease, unspecified: Secondary | ICD-10-CM

## 2015-01-01 DIAGNOSIS — I10 Essential (primary) hypertension: Secondary | ICD-10-CM | POA: Diagnosis not present

## 2015-01-01 DIAGNOSIS — K219 Gastro-esophageal reflux disease without esophagitis: Secondary | ICD-10-CM

## 2015-01-01 DIAGNOSIS — K5900 Constipation, unspecified: Secondary | ICD-10-CM | POA: Diagnosis not present

## 2015-01-01 DIAGNOSIS — F0280 Dementia in other diseases classified elsewhere without behavioral disturbance: Secondary | ICD-10-CM

## 2015-01-01 DIAGNOSIS — E039 Hypothyroidism, unspecified: Secondary | ICD-10-CM | POA: Diagnosis not present

## 2015-01-01 NOTE — Assessment & Plan Note (Signed)
05/19/14 MMSE 18/30, continue Namenda.

## 2015-01-01 NOTE — Assessment & Plan Note (Signed)
Stable, no change in tx

## 2015-01-01 NOTE — Progress Notes (Signed)
Patient ID: Crystal Petersen, female   DOB: 12-31-1921, 81 y.o.   MRN: [id number]  Location:  SNF [facility] Provider:  Marlana Latus NP  Code Status:  DNR Goals of care: Advanced Directive information    Chief Complaint  Patient presents with  . Medical Management of Chronic Issues     HPI: Patient is a 81 y.o. female seen in the SNF at [facility] today for evaluation of chronic medical conditions. Hx of GERD symptomatic, last TSH wnl 09/2014 while on Levothyroxine 153mcg, controlled blood pressure while taking Labetalol 100mg  bid, taking Namenda for memory loss, she is functioning adequately in SNF. Denied pain, constipation, or trouble sleeping at night.   Review of Systems:  Review of Systems  Constitutional: Negative for fever and chills.  HENT: Positive for hearing loss. Negative for congestion, ear discharge and ear pain.   Eyes: Negative for pain, discharge and redness.  Respiratory: Negative for cough, shortness of breath and wheezing.   Cardiovascular: Negative for chest pain and leg swelling.  Gastrointestinal: Negative for nausea, vomiting, abdominal pain and constipation.  Genitourinary: Positive for frequency. Negative for dysuria and urgency.  Musculoskeletal: Positive for back pain. Negative for myalgias and neck pain.  Skin: Negative for rash.  Neurological: Negative for dizziness, tremors, seizures, weakness and headaches.  Psychiatric/Behavioral: Positive for memory loss. Negative for suicidal ideas and hallucinations. The patient is nervous/anxious.     Past Medical History  Diagnosis Date  . Hypertension   . Nonspecific abnormal electrocardiogram (ECG) (EKG)   . Stricture and stenosis of esophagus   . Esophageal reflux   . Diverticulosis of colon (without mention of hemorrhage)   . Benign neoplasm of colon   . Macular degeneration   . Osteoarthritis     right hip  . Other and unspecified hyperlipidemia   . HTN (hypertension)   . Shingles   . Cystocele    . Gastroparesis   . Skin cancer (melanoma)   . Hypothyroidism   . Cancer     pre-melanoma  . Hemorrhoids   . Primary osteoarthritis of right hip 03/27/2014  . Acute blood loss anemia 03/29/2014    03/30/14 Hgb 7.3 04/04/14 Hgb 8.5 continue Fe bid.  05/04/14 Hgb 10.8   . Dementia of the Alzheimer's type 05/22/2014    05/19/14 MMSE 18/30 Namenda.    . Diastolic dysfunction 11/25/4816  . Diverticulosis of large intestine 06/28/2004  . Essential hypertension 07/05/2013  . Hyperlipidemia 07/05/2013  . Internal hemorrhoids without mention of complication 56/06/1495  . Prediabetes 11/17/2013  . Stricture and stenosis of esophagus 10/27/2007  . Vitamin D deficiency 11/17/2013    Patient Active Problem List   Diagnosis Date Noted  . Hypokalemia 07/19/2014  . IDA (iron deficiency anemia) 07/19/2014  . Constipation 07/19/2014  . Medication management 07/06/2014  . Dementia of the Alzheimer's type 05/22/2014  . Macular degeneration 05/22/2014  . Cystocele 05/22/2014  . Melanoma of skin 05/22/2014  . Unstable gait 05/22/2014  . Nodule of left lung 05/22/2014  . Adenomatous colon polyp 05/22/2014  . Osteoporosis 05/22/2014  . History of herpes zoster 05/22/2014  . Gastroparesis 05/22/2014  . Deaf 05/22/2014  . Periprosthetic fracture around internal prosthetic right hip joint 04/12/2014  . Primary osteoarthritis of right hip 03/27/2014  . Prediabetes 11/17/2013  . Vitamin D deficiency 11/17/2013  . Hyperlipidemia 07/05/2013  . Hypothyroidism 07/05/2013  . Essential hypertension 07/05/2013  . Diastolic dysfunction 02/63/7858  . Stricture and stenosis of esophagus 10/27/2007  . GERD  10/25/2007  . Diverticulosis of large intestine 06/28/2004    Allergies  Allergen Reactions  . Ace Inhibitors     Elevated potassium  . Fosamax [Alendronate Sodium]   . Metoclopramide Hcl Other (See Comments)    Pt. Not sure  . Reglan [Metoclopramide]   . Statins Other (See Comments)    myalgia  . Sulfa  Antibiotics   . Vesicare [Solifenacin]   . Vicodin [Hydrocodone-Acetaminophen] Other (See Comments)    "felt spaced out"  . Sulfonamide Derivatives Rash    Medications: Patient's Medications  New Prescriptions   No medications on file  Previous Medications   CALCIUM CARBONATE (CAL-GEST ANTACID) 500 MG CHEWABLE TABLET    Chew 1 tablet by mouth as needed.    CHOLECALCIFEROL (VITAMIN D) 2000 UNITS TABLET    Take 4,000 Units by mouth daily.   DOCUSATE SODIUM (COLACE) 100 MG CAPSULE    Take 100 mg by mouth 2 (two) times daily as needed.    FERROUS SULFATE 325 (65 FE) MG TABLET    Take 1 tablet (325 mg total) by mouth 2 (two) times daily with a meal.   LABETALOL (NORMODYNE) 100 MG TABLET    TAKE 1 TABLET BY MOUTH TWICE A DAY   MEMANTINE (NAMENDA XR) 28 MG CP24 24 HR CAPSULE    Take 28 mg by mouth daily.   MUPIROCIN OINTMENT (BACTROBAN) 2 %    daily.   OMEPRAZOLE (PRILOSEC) 40 MG CAPSULE    TAKE ONE CAPSULE BY MOUTH EVERY DAY FOR ACID REFLUX   POLYETHYLENE GLYCOL (MIRALAX / GLYCOLAX) PACKET    Take 17 g by mouth as needed for mild constipation.    POTASSIUM CHLORIDE SA (K-DUR,KLOR-CON) 20 MEQ TABLET    Take 20 mEq by mouth daily.   TRAMADOL (ULTRAM) 50 MG TABLET    Take 1 tablet (50 mg total) by mouth every 6 (six) hours as needed.   ZETIA 10 MG TABLET    Take 10 mg by mouth daily.  Modified Medications   No medications on file  Discontinued Medications   No medications on file    Physical Exam: Filed Vitals:   01/01/15 1533  BP: 128/70  Pulse: 72  Temp: 97.8 F (36.6 C)  TempSrc: Tympanic  Resp: 18   There is no weight on file to calculate BMI.  Physical Exam  Constitutional: She is oriented to person, place, and time. She appears well-developed and well-nourished. No distress.  HENT:  Head: Normocephalic and atraumatic.  Right Ear: External ear normal.  Mouth/Throat: No oropharyngeal exudate.  Eyes: Conjunctivae and EOM are normal. Pupils are equal, round, and reactive to  light. Right eye exhibits no discharge. Left eye exhibits no discharge. No scleral icterus.  Neck: Normal range of motion. Neck supple. No JVD present. No tracheal deviation present. No thyromegaly present.  Cardiovascular: Normal rate, regular rhythm, normal heart sounds and intact distal pulses.   No murmur heard. Pulmonary/Chest: Effort normal and breath sounds normal. No respiratory distress. She has no wheezes. She has no rales. She exhibits no tenderness.  Abdominal: Soft. Bowel sounds are normal. She exhibits no distension and no mass. There is no tenderness.  Musculoskeletal: Normal range of motion. She exhibits tenderness. She exhibits no edema.  Right hip pain with ROM.  Neurological: She is alert and oriented to person, place, and time. She has normal reflexes. No cranial nerve deficit. She exhibits normal muscle tone. Coordination normal.  Skin: Skin is dry. No rash noted. She is not  diaphoretic. No pallor.  Right hip surgical incision healed.   Psychiatric: Her behavior is normal. Judgment and thought content normal. Her mood appears anxious. Her speech is not rapid and/or pressured, not delayed, not tangential and not slurred. Cognition and memory are impaired. She exhibits a depressed mood. She is communicative. She exhibits abnormal recent memory. She exhibits normal remote memory.    Labs reviewed: Basic Metabolic Panel:  Recent Labs  02/22/14 1100  03/28/14 0458 03/29/14 1040  07/06/14 1726 07/20/14 09/28/14  NA 139  < > 138 132*  < > 140 140 142  K 3.7  < > 2.7* 3.5*  < > 4.3 4.0 3.8  CL 98  < > 97 96  --  105  --   --   CO2 28  < > 22 23  --  26  --   --   GLUCOSE 79  < > 103* 104*  --  97  --   --   BUN 16  < > 14 14  < > 20 19 16   CREATININE 0.80  < > 0.56 0.71  < > 0.64 0.6 0.6  CALCIUM 9.7  < > 7.9* 8.4  --  8.9  --   --   MG 1.9  --   --   --   --  1.9  --   --   < > = values in this interval not displayed.  Liver Function Tests:  Recent Labs   02/22/14 1100 03/20/14 1300  07/06/14 1726 07/20/14 09/28/14  AST 18 18  < > 11 13 12*  ALT 15 12  < > 8 8 8   ALKPHOS 70 71  < > 68 54 55  BILITOT 0.6 0.3  --  0.3  --   --   PROT 6.2 6.5  --  5.7*  --   --   ALBUMIN 4.0 3.7  --  3.6  --   --   < > = values in this interval not displayed.  CBC:  Recent Labs  02/22/14 1100 03/20/14 1300  03/29/14 0644 03/30/14 0535  07/06/14 1726 07/20/14 08/22/14 10/05/14  WBC 11.9* 9.6  < > 10.4 8.4  < > 7.1 7.0 6.9 6.5  NEUTROABS 9.0* 6.9  --   --   --   --  4.3  --   --   --   HGB 12.3 11.9*  < > 7.9* 7.3*  < > 12.2 11.8* 11.7* 12.4  HCT 36.3 37.0  < > 23.3* 22.7*  < > 38.2 36 36 37  MCV 81.4 83.7  < > 84.1 82.2  --  87.8  --   --   --   PLT 390 373  < > 203 234  < > 348 263 265 238  < > = values in this interval not displayed.  Lab Results  Component Value Date   TSH 2.19 09/28/2014   Lab Results  Component Value Date   HGBA1C 5.8* 07/06/2014   Lab Results  Component Value Date   CHOL 209* 07/06/2014   HDL 66 07/06/2014   LDLCALC 111* 07/06/2014   TRIG 160* 07/06/2014   CHOLHDL 3.2 07/06/2014    Significant Diagnostic Results since last visit: none  Patient Care Team: Unk Pinto, MD as PCP - General (Internal Medicine) Hurman Horn, MD as Consulting Physician (Ophthalmology) Burnell Blanks, MD as Consulting Physician (Cardiology) Inda Castle, MD as Consulting Physician (Gastroenterology) Jari Pigg, MD as Consulting Physician (  Dermatology) Dorna Leitz, MD as Consulting Physician (Orthopedic Surgery)  Assessment/Plan Problem List Items Addressed This Visit    GERD - Primary    Stable, no change in tx      Hypothyroidism    Last TSH 2.188 09/2014, continue Levothyroxine 159mcg po daily.      Essential hypertension    Controlled, continue Labetalol 100mg  bid.      Dementia of the Alzheimer's type    05/19/14 MMSE 18/30, continue Namenda.       Constipation    Stable, continue Senokot S II  nightly.           Family/ staff Communication: continue SNF for care needs  Labs/tests ordered:  None  [facility] Stacie Templin NP Geriatrics [facility] [address], [place] [address] On Call:  [phone] & follow prompts after 5pm & weekends Office Phone:  [phone] Office Fax:  [phone]

## 2015-01-01 NOTE — Assessment & Plan Note (Signed)
Last TSH 2.188 09/2014, continue Levothyroxine 146mcg po daily.

## 2015-01-01 NOTE — Assessment & Plan Note (Signed)
Controlled, continue Labetalol 100mg bid 

## 2015-01-01 NOTE — Assessment & Plan Note (Signed)
Stable, continue Senokot S II nightly.  

## 2015-01-19 ENCOUNTER — Encounter: Payer: Self-pay | Admitting: Internal Medicine

## 2015-01-19 ENCOUNTER — Non-Acute Institutional Stay (SKILLED_NURSING_FACILITY): Payer: Medicare Other | Admitting: Internal Medicine

## 2015-01-19 DIAGNOSIS — K219 Gastro-esophageal reflux disease without esophagitis: Secondary | ICD-10-CM

## 2015-01-19 DIAGNOSIS — F0280 Dementia in other diseases classified elsewhere without behavioral disturbance: Secondary | ICD-10-CM

## 2015-01-19 DIAGNOSIS — E039 Hypothyroidism, unspecified: Secondary | ICD-10-CM

## 2015-01-19 DIAGNOSIS — I10 Essential (primary) hypertension: Secondary | ICD-10-CM | POA: Diagnosis not present

## 2015-01-19 DIAGNOSIS — M9701XD Periprosthetic fracture around internal prosthetic right hip joint, subsequent encounter: Secondary | ICD-10-CM

## 2015-01-19 DIAGNOSIS — G309 Alzheimer's disease, unspecified: Secondary | ICD-10-CM

## 2015-01-19 NOTE — Progress Notes (Signed)
Patient ID: CASH MEADOW, female   DOB: Aug 29, 1921, 81 y.o.   MRN: [id number]    [place] Room Number: 30  Place of Service: SNF (31)     Allergies  Allergen Reactions  . Ace Inhibitors     Elevated potassium  . Fosamax [Alendronate Sodium]   . Metoclopramide Hcl Other (See Comments)    Pt. Not sure  . Reglan [Metoclopramide]   . Statins Other (See Comments)    myalgia  . Sulfa Antibiotics   . Vesicare [Solifenacin]   . Vicodin [Hydrocodone-Acetaminophen] Other (See Comments)    "felt spaced out"  . Sulfonamide Derivatives Rash    Chief Complaint  Patient presents with  . Medical Management of Chronic Issues    HPI:  Dementia of the Alzheimer's type  - unchanged. Requiring a great deal of staff assistance in meeting daily needs.  Essential hypertension  - controlled  Gastroesophageal reflux disease without esophagitis  - currently asymptomatic. Using omeprazole daily.  Hypothyroidism, unspecified hypothyroidism type  - compensated with normal TSH in June 2016  Periprosthetic fracture around internal prosthetic right hip joint, subsequent encounter  - right upper leg and right hip are much more comfortable.    Medications: Patient's Medications  New Prescriptions   No medications on file  Previous Medications   ASPIRIN EC 81 MG TABLET    Take 81 mg by mouth daily.   CALCIUM CARBONATE (CAL-GEST ANTACID) 500 MG CHEWABLE TABLET    Chew 1 tablet by mouth. Chew one tablet up to four times daily as needed for indigestion   CHOLECALCIFEROL (VITAMIN D) 2000 UNITS TABLET    Take 4,000 Units by mouth daily.   DOCUSATE SODIUM (COLACE) 100 MG CAPSULE    Take 100 mg by mouth 2 (two) times daily as needed.    LABETALOL (NORMODYNE) 100 MG TABLET    TAKE 1 TABLET BY MOUTH TWICE A DAY   LEVOTHYROXINE (SYNTHROID, LEVOTHROID) 100 MCG TABLET    Take 100 mcg by mouth daily before breakfast.   MEMANTINE (NAMENDA XR) 28 MG CP24 24 HR CAPSULE     Take 28 mg by mouth daily.   MULTIPLE VITAMINS-MINERALS (PRESERVISION/LUTEIN) CAPS    Take by mouth. Take one twice daily   POTASSIUM CHLORIDE SA (K-DUR,KLOR-CON) 20 MEQ TABLET    Take 20 mEq by mouth daily.   SENNOSIDES-DOCUSATE SODIUM (SENOKOT-S) 8.6-50 MG TABLET    Take 1 tablet by mouth. One at bedtime  Modified Medications   No medications on file  Discontinued Medications   FERROUS SULFATE 325 (65 FE) MG TABLET    Take 1 tablet (325 mg total) by mouth 2 (two) times daily with a meal.   MUPIROCIN OINTMENT (BACTROBAN) 2 %    daily.   OMEPRAZOLE (PRILOSEC) 40 MG CAPSULE    TAKE ONE CAPSULE BY MOUTH EVERY DAY FOR ACID REFLUX   POLYETHYLENE GLYCOL (MIRALAX / GLYCOLAX) PACKET    Take 17 g by mouth as needed for mild constipation.    TRAMADOL (ULTRAM) 50 MG TABLET    Take 1 tablet (50 mg total) by mouth every 6 (six) hours as needed.   ZETIA 10 MG TABLET    Take 10 mg by mouth daily.     Review of Systems  Constitutional: Negative for fever, chills, diaphoresis, activity change, appetite change, fatigue and unexpected weight change.  HENT: Positive for hearing loss. Negative for congestion, ear discharge, ear pain, postnasal drip, rhinorrhea, sore throat, tinnitus, trouble swallowing  and voice change.   Eyes: Negative for pain, redness, itching and visual disturbance (corrective lenses).       Macular degeneration  Respiratory: Negative.  Negative for cough, choking, shortness of breath and wheezing.        HX of abnormal CXR with left lung nodule at the base since 2014  Cardiovascular: Negative for chest pain, palpitations and leg swelling.  Gastrointestinal: Negative for nausea, abdominal pain, diarrhea, constipation and abdominal distention.       Hemorrhoids and benign adenomatous polyp  Endocrine: Negative for cold intolerance, heat intolerance, polydipsia, polyphagia and polyuria.       History of hypothyroidism and prediabetes  Genitourinary: Negative for dysuria, urgency,  frequency, hematuria, flank pain, vaginal discharge, difficulty urinating and pelvic pain.       Incontinence. Hx of cystocele  Musculoskeletal: Positive for arthralgias (right hip pain and history of right THA and subsequennt periprosthetic fracture) and gait problem. Negative for myalgias, back pain, neck pain and neck stiffness.  Skin: Negative for color change, pallor and rash.  Allergic/Immunologic: Negative.   Neurological: Negative for dizziness, tremors, seizures, syncope, weakness, numbness and headaches.       Memory deficit  Hematological: Negative for adenopathy. Does not bruise/bleed easily.  Psychiatric/Behavioral: Negative for suicidal ideas, hallucinations, behavioral problems, confusion, sleep disturbance, dysphoric mood and agitation. The patient is not nervous/anxious and is not hyperactive.        Anxious     Filed Vitals:   01/19/15 1544  BP: 172/66  Pulse: 63  Temp: 98.1 F (36.7 C)  Resp: 17  Height: _0  (1.549 m)  Weight: 128 lb (58.06 kg)   Body mass index is 24.2 kg/(m^2).  Physical Exam  Constitutional: She is oriented to person, place, and time. She appears well-developed and well-nourished. No distress.  HENT:  Right Ear: External ear normal.  Left Ear: External ear normal.  Nose: Nose normal.  Mouth/Throat: Oropharynx is clear and moist. No oropharyngeal exudate.  Bilateral loss of hearing and use of aids  Eyes: Conjunctivae and EOM are normal. Pupils are equal, round, and reactive to light. No scleral icterus.  Neck: No JVD present. No tracheal deviation present. No thyromegaly present.  Cardiovascular: Normal rate, regular rhythm, normal heart sounds and intact distal pulses.  Exam reveals no gallop and no friction rub.   No murmur heard. Pulmonary/Chest: Effort normal. No respiratory distress. She has no wheezes. She has no rales. She exhibits no tenderness.  Abdominal: She exhibits no distension and no mass. There is no tenderness.    Musculoskeletal: Normal range of motion. She exhibits no edema or tenderness.  Swollen and tender right hip. Surgical incision is healing and there is not excessive erythema or bruising, Kyphosis.  Lymphadenopathy:    She has no cervical adenopathy.  Neurological: She is alert and oriented to person, place, and time. No cranial nerve deficit. Coordination normal.  Memory impaired.  Skin: No rash noted. She is not diaphoretic. No erythema. No pallor.  Psychiatric: She has a normal mood and affect. Her behavior is normal. Judgment and thought content normal.     Labs reviewed: Lab Summary Latest Ref Rng 10/05/2014 09/28/2014 08/22/2014 07/20/2014 07/06/2014  Hemoglobin 12.0 - 16.0 g/dL 12.4 (None) 11.7(A) 11.8(A) 12.2  Hematocrit 36 - 46 % 37 (None) 36 36 38.2  White count - 6.5 (None) 6.9 7.0 7.1  Platelet count 150 - 399 K/L 238 (None) 265 263 348  Sodium 137 - 147 mmol/L (None) 142 (None) 140  140  Potassium 3.4 - 5.3 mmol/L (None) 3.8 (None) 4.0 4.3  Calcium 8.4 - 10.5 mg/dL (None) (None) (None) (None) 8.9  Phosphorus - (None) (None) (None) (None) (None)  Creatinine 0.5 - 1.1 mg/dL (None) 0.6 (None) 0.6 0.64  AST 13 - 35 U/L (None) 12(A) (None) 13 11  Alk Phos 25 - 125 U/L (None) 55 (None) 54 68  Bilirubin 0.2 - 1.2 mg/dL (None) (None) (None) (None) 0.3  Glucose - (None) 77 (None) 74 97  Cholesterol 0 - 200 mg/dL (None) (None) (None) (None) 209(H)  HDL cholesterol >=46 mg/dL (None) (None) (None) (None) 66  Triglycerides <150 mg/dL (None) (None) (None) (None) 160(H)  LDL Direct - (None) (None) (None) (None) (None)  LDL Calc 0 - 99 mg/dL (None) (None) (None) (None) 111(H)  Total protein 6.0 - 8.3 g/dL (None) (None) (None) (None) 5.7(L)  Albumin 3.5 - 5.2 g/dL (None) (None) (None) (None) 3.6   Lab Results  Component Value Date   TSH 2.19 09/28/2014   Lab Results  Component Value Date   BUN 16 09/28/2014   Lab Results  Component Value Date   HGBA1C 5.8* 07/06/2014        Assessment/Plan  1. Dementia of the Alzheimer's type Unchanged 2. Essential hypertension Controlled  3. Gastroesophageal reflux disease without esophagitis Asymptomatic  4. Hypothyroidism, unspecified hypothyroidism type Compensated  5. Periprosthetic fracture around internal prosthetic right hip joint, subsequent encounter Improvement in pain

## 2015-02-12 ENCOUNTER — Encounter: Payer: Self-pay | Admitting: Nurse Practitioner

## 2015-02-12 ENCOUNTER — Non-Acute Institutional Stay (SKILLED_NURSING_FACILITY): Payer: Medicare Other | Admitting: Nurse Practitioner

## 2015-02-12 DIAGNOSIS — K5900 Constipation, unspecified: Secondary | ICD-10-CM

## 2015-02-12 DIAGNOSIS — K219 Gastro-esophageal reflux disease without esophagitis: Secondary | ICD-10-CM | POA: Diagnosis not present

## 2015-02-12 DIAGNOSIS — E039 Hypothyroidism, unspecified: Secondary | ICD-10-CM

## 2015-02-12 DIAGNOSIS — G309 Alzheimer's disease, unspecified: Secondary | ICD-10-CM

## 2015-02-12 DIAGNOSIS — F0280 Dementia in other diseases classified elsewhere without behavioral disturbance: Secondary | ICD-10-CM

## 2015-02-12 DIAGNOSIS — I10 Essential (primary) hypertension: Secondary | ICD-10-CM

## 2015-02-12 NOTE — Progress Notes (Signed)
Patient ID: Crystal Petersen, female   DOB: 1921-11-25, 81 y.o.   MRN: [id number]  Location:  SNF [facility] Provider:  Marlana Latus NP  Code Status:  DNR Goals of care: Advanced Directive information    Chief Complaint  Patient presents with  . Medical Management of Chronic Issues     HPI: Patient is a 81 y.o. female seen in the SNF at [facility] today for evaluation of chronic medical conditions. Hx of GERD symptomatic, last TSH wnl 09/2014 while on Levothyroxine 180mcg, controlled blood pressure while taking Labetalol 100mg  bid, taking Namenda for memory loss, she is functioning adequately in SNF. Denied pain, constipation, or trouble sleeping at night.   Review of Systems  Constitutional: Negative for fever and chills.  HENT: Positive for hearing loss. Negative for congestion, ear discharge and ear pain.   Eyes: Negative for pain, discharge and redness.  Respiratory: Negative for cough, shortness of breath and wheezing.   Cardiovascular: Negative for chest pain and leg swelling.  Gastrointestinal: Negative for nausea, vomiting, abdominal pain and constipation.  Genitourinary: Positive for frequency. Negative for dysuria and urgency.  Musculoskeletal: Positive for back pain. Negative for myalgias and neck pain.  Skin: Negative for rash.  Neurological: Negative for dizziness, tremors, seizures, weakness and headaches.  Psychiatric/Behavioral: Positive for memory loss. Negative for suicidal ideas and hallucinations. The patient is nervous/anxious.     Past Medical History  Diagnosis Date  . Hypertension   . Nonspecific abnormal electrocardiogram (ECG) (EKG)   . Stricture and stenosis of esophagus   . Esophageal reflux   . Diverticulosis of colon (without mention of hemorrhage)   . Benign neoplasm of colon   . Macular degeneration   . Osteoarthritis     right hip  . Other and unspecified hyperlipidemia   . HTN (hypertension)   . Shingles   . Cystocele   . Gastroparesis     . Skin cancer (melanoma) ([place])   . Hypothyroidism   . Cancer ([place])     pre-melanoma  . Hemorrhoids   . Primary osteoarthritis of right hip 03/27/2014  . Acute blood loss anemia 03/29/2014    03/30/14 Hgb 7.3 04/04/14 Hgb 8.5 continue Fe bid.  05/04/14 Hgb 10.8   . Dementia of the Alzheimer's type 05/22/2014    05/19/14 MMSE 18/30 Namenda.    . Diastolic dysfunction 0/12/3816  . Diverticulosis of large intestine 06/28/2004  . Essential hypertension 07/05/2013  . Hyperlipidemia 07/05/2013  . Internal hemorrhoids without mention of complication 29/12/3714  . Prediabetes 11/17/2013  . Stricture and stenosis of esophagus 10/27/2007  . Vitamin D deficiency 11/17/2013    Patient Active Problem List   Diagnosis Date Noted  . Constipation 07/19/2014  . Dementia of the Alzheimer's type 05/22/2014  . Macular degeneration 05/22/2014  . Cystocele 05/22/2014  . Melanoma of skin ([place]) 05/22/2014  . Unstable gait 05/22/2014  . Nodule of left lung 05/22/2014  . Adenomatous colon polyp 05/22/2014  . Osteoporosis 05/22/2014  . History of herpes zoster 05/22/2014  . Gastroparesis 05/22/2014  . Deaf 05/22/2014  . Periprosthetic fracture around internal prosthetic right hip joint ([place]) 04/12/2014  . Primary osteoarthritis of right hip 03/27/2014  . Prediabetes 11/17/2013  . Vitamin D deficiency 11/17/2013  . Hyperlipidemia 07/05/2013  . Hypothyroidism 07/05/2013  . Essential hypertension 07/05/2013  . Diastolic dysfunction 96/78/9381  . Stricture and stenosis of esophagus 10/27/2007  . GERD 10/25/2007  . Diverticulosis of large intestine 06/28/2004    Allergies  Allergen Reactions  .  Ace Inhibitors     Elevated potassium  . Fosamax [Alendronate Sodium]   . Metoclopramide Hcl Other (See Comments)    Pt. Not sure  . Reglan [Metoclopramide]   . Statins Other (See Comments)    myalgia  . Sulfa Antibiotics   . Vesicare [Solifenacin]   . Vicodin [Hydrocodone-Acetaminophen] Other (See Comments)     "felt spaced out"  . Sulfonamide Derivatives Rash    Medications: Patient's Medications  New Prescriptions   No medications on file  Previous Medications   ASPIRIN EC 81 MG TABLET    Take 81 mg by mouth daily.   CALCIUM CARBONATE (CAL-GEST ANTACID) 500 MG CHEWABLE TABLET    Chew 1 tablet by mouth. Chew one tablet up to four times daily as needed for indigestion   CHOLECALCIFEROL (VITAMIN D) 2000 UNITS TABLET    Take 4,000 Units by mouth daily.   DOCUSATE SODIUM (COLACE) 100 MG CAPSULE    Take 100 mg by mouth 2 (two) times daily as needed.    LABETALOL (NORMODYNE) 100 MG TABLET    TAKE 1 TABLET BY MOUTH TWICE A DAY   LEVOTHYROXINE (SYNTHROID, LEVOTHROID) 100 MCG TABLET    Take 100 mcg by mouth daily before breakfast.   MEMANTINE (NAMENDA XR) 28 MG CP24 24 HR CAPSULE    Take 28 mg by mouth daily.   MULTIPLE VITAMINS-MINERALS (PRESERVISION/LUTEIN) CAPS    Take by mouth. Take one twice daily   POTASSIUM CHLORIDE SA (K-DUR,KLOR-CON) 20 MEQ TABLET    Take 20 mEq by mouth daily.   SENNOSIDES-DOCUSATE SODIUM (SENOKOT-S) 8.6-50 MG TABLET    Take 1 tablet by mouth. One at bedtime  Modified Medications   No medications on file  Discontinued Medications   No medications on file    Physical Exam: Filed Vitals:   02/12/15 1424  BP: 126/66  Pulse: 80  Temp: 98.9 F (37.2 C)  TempSrc: Tympanic  Resp: 20   There is no weight on file to calculate BMI.  Physical Exam  Constitutional: She is oriented to person, place, and time. She appears well-developed and well-nourished. No distress.  HENT:  Head: Normocephalic and atraumatic.  Right Ear: External ear normal.  Mouth/Throat: No oropharyngeal exudate.  Eyes: Conjunctivae and EOM are normal. Pupils are equal, round, and reactive to light. Right eye exhibits no discharge. Left eye exhibits no discharge. No scleral icterus.  Neck: Normal range of motion. Neck supple. No JVD present. No tracheal deviation present. No thyromegaly present.    Cardiovascular: Normal rate, regular rhythm, normal heart sounds and intact distal pulses.   No murmur heard. Pulmonary/Chest: Effort normal and breath sounds normal. No respiratory distress. She has no wheezes. She has no rales. She exhibits no tenderness.  Abdominal: Soft. Bowel sounds are normal. She exhibits no distension and no mass. There is no tenderness.  Musculoskeletal: Normal range of motion. She exhibits tenderness. She exhibits no edema.  Right hip pain with ROM.  Neurological: She is alert and oriented to person, place, and time. She has normal reflexes. No cranial nerve deficit. She exhibits normal muscle tone. Coordination normal.  Skin: Skin is dry. No rash noted. She is not diaphoretic. No pallor.  Right hip surgical incision healed.   Psychiatric: Her behavior is normal. Judgment and thought content normal. Her mood appears anxious. Her speech is not rapid and/or pressured, not delayed, not tangential and not slurred. Cognition and memory are impaired. She exhibits a depressed mood. She is communicative. She exhibits abnormal recent  memory. She exhibits normal remote memory.    Labs reviewed: Basic Metabolic Panel:  Recent Labs  02/22/14 1100  03/28/14 0458 03/29/14 1040  07/06/14 1726 07/20/14 09/28/14  NA 139  < > 138 132*  < > 140 140 142  K 3.7  < > 2.7* 3.5*  < > 4.3 4.0 3.8  CL 98  < > 97 96  --  105  --   --   CO2 28  < > 22 23  --  26  --   --   GLUCOSE 79  < > 103* 104*  --  97  --   --   BUN 16  < > 14 14  < > 20 19 16   CREATININE 0.80  < > 0.56 0.71  < > 0.64 0.6 0.6  CALCIUM 9.7  < > 7.9* 8.4  --  8.9  --   --   MG 1.9  --   --   --   --  1.9  --   --   < > = values in this interval not displayed.  Liver Function Tests:  Recent Labs  02/22/14 1100 03/20/14 1300  07/06/14 1726 07/20/14 09/28/14  AST 18 18  < > 11 13 12*  ALT 15 12  < > 8 8 8   ALKPHOS 70 71  < > 68 54 55  BILITOT 0.6 0.3  --  0.3  --   --   PROT 6.2 6.5  --  5.7*  --   --    ALBUMIN 4.0 3.7  --  3.6  --   --   < > = values in this interval not displayed.  CBC:  Recent Labs  02/22/14 1100 03/20/14 1300  03/29/14 0644 03/30/14 0535  07/06/14 1726 07/20/14 08/22/14 10/05/14  WBC 11.9* 9.6  < > 10.4 8.4  < > 7.1 7.0 6.9 6.5  NEUTROABS 9.0* 6.9  --   --   --   --  4.3  --   --   --   HGB 12.3 11.9*  < > 7.9* 7.3*  < > 12.2 11.8* 11.7* 12.4  HCT 36.3 37.0  < > 23.3* 22.7*  < > 38.2 36 36 37  MCV 81.4 83.7  < > 84.1 82.2  --  87.8  --   --   --   PLT 390 373  < > 203 234  < > 348 263 265 238  < > = values in this interval not displayed.  Lab Results  Component Value Date   TSH 2.19 09/28/2014   Lab Results  Component Value Date   HGBA1C 5.8* 07/06/2014   Lab Results  Component Value Date   CHOL 209* 07/06/2014   HDL 66 07/06/2014   LDLCALC 111* 07/06/2014   TRIG 160* 07/06/2014   CHOLHDL 3.2 07/06/2014    Significant Diagnostic Results since last visit: none  Patient Care Team: Unk Pinto, MD as PCP - General (Internal Medicine) Hurman Horn, MD as Consulting Physician (Ophthalmology) Burnell Blanks, MD as Consulting Physician (Cardiology) Inda Castle, MD as Consulting Physician (Gastroenterology) Jari Pigg, MD as Consulting Physician (Dermatology) Dorna Leitz, MD as Consulting Physician (Orthopedic Surgery)  Assessment/Plan Problem List Items Addressed This Visit    Hypothyroidism - Primary (Chronic)    Last TSH 2.188 09/2014, continue Levothyroxine 127mcg po daily.      GERD (Chronic)    stable      Essential hypertension (Chronic)  Controlled, continue Labetalol 100mg  bid.      Dementia of the Alzheimer's type (Chronic)    05/19/14 MMSE 18/30, continue Namenda, SNF for care needs      Constipation    Stable, continue Senokot S II nightly.           Family/ staff Communication: continue SNF for care needs  Labs/tests ordered:  None  [facility] Domenic Schoenberger NP Geriatrics [facility] [address], [place] [address] On Call:  [phone] & follow prompts after 5pm & weekends Office Phone:  [phone] Office Fax:  [phone]

## 2015-02-16 NOTE — Assessment & Plan Note (Signed)
Last TSH 2.188 09/2014, continue Levothyroxine 162mcg po daily.

## 2015-02-16 NOTE — Assessment & Plan Note (Signed)
stable °

## 2015-02-16 NOTE — Assessment & Plan Note (Signed)
Stable, continue Senokot S II nightly.  

## 2015-02-16 NOTE — Assessment & Plan Note (Signed)
05/19/14 MMSE 18/30, continue Namenda, SNF for care needs 

## 2015-02-16 NOTE — Assessment & Plan Note (Signed)
Controlled, continue Labetalol 100mg bid 

## 2015-02-25 IMAGING — RF DG HIP OPERATIVE*R*
1 series · 1 of 1 positions shown · non-contrast
Comparison: September 08, 2012

CLINICAL DATA: Status post arthroplasty

EXAM:
DG OPERATIVE RIGHT HIP 1-2 VIEWS

[Series 1: run · 1 of 1 slices shown]
[im 1/1]
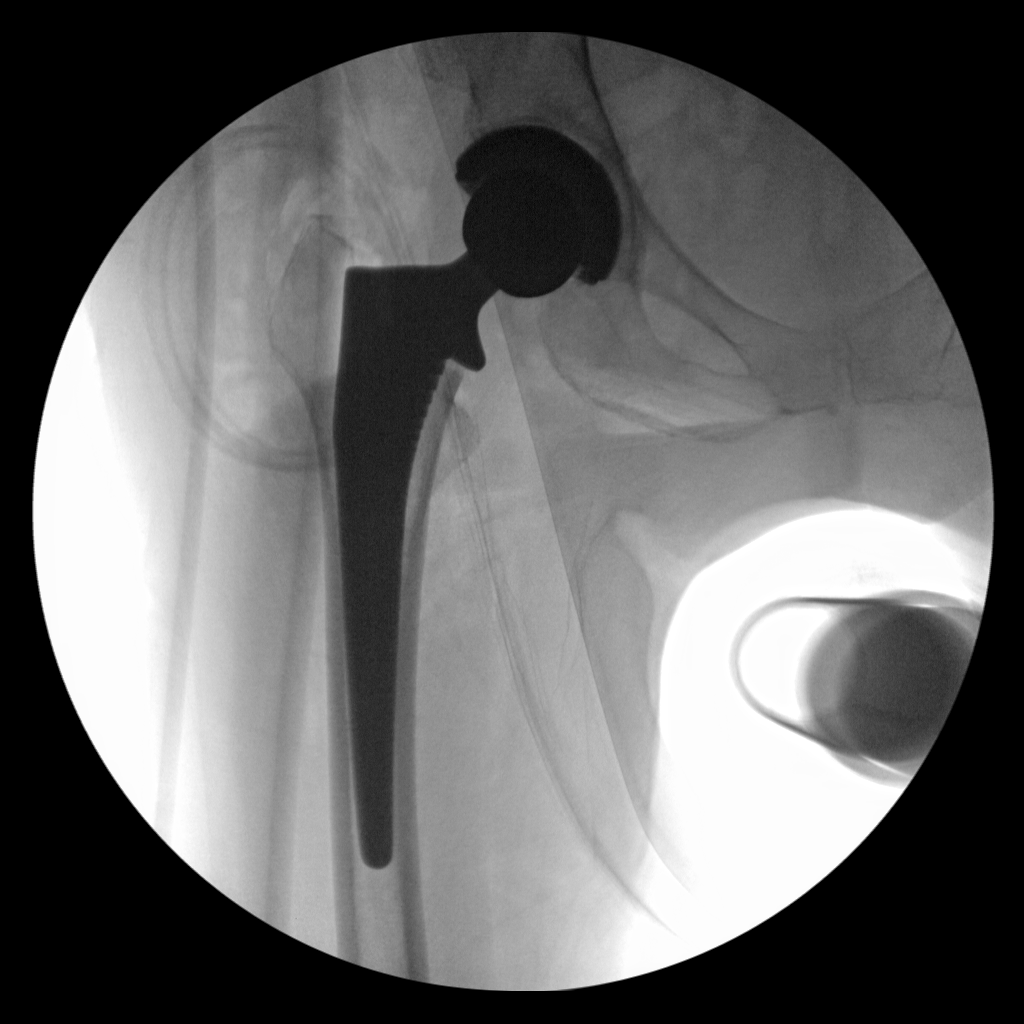

[1 of 1 positions shown; findings below may reference images not displayed]

FINDINGS: There is a total hip prosthesis on the right with prosthetic
components appearing well-seated. No fracture or dislocation.
IMPRESSION: Prosthetic components well-seated. No acute fracture or dislocation.

## 2015-02-25 IMAGING — CR DG PORTABLE PELVIS
1 series · 1 of 1 positions shown · non-contrast
Comparison: None.

CLINICAL DATA: Status post total hip arthroplasty

EXAM:
PORTABLE PELVIS 1-2 VIEWS

[AP]
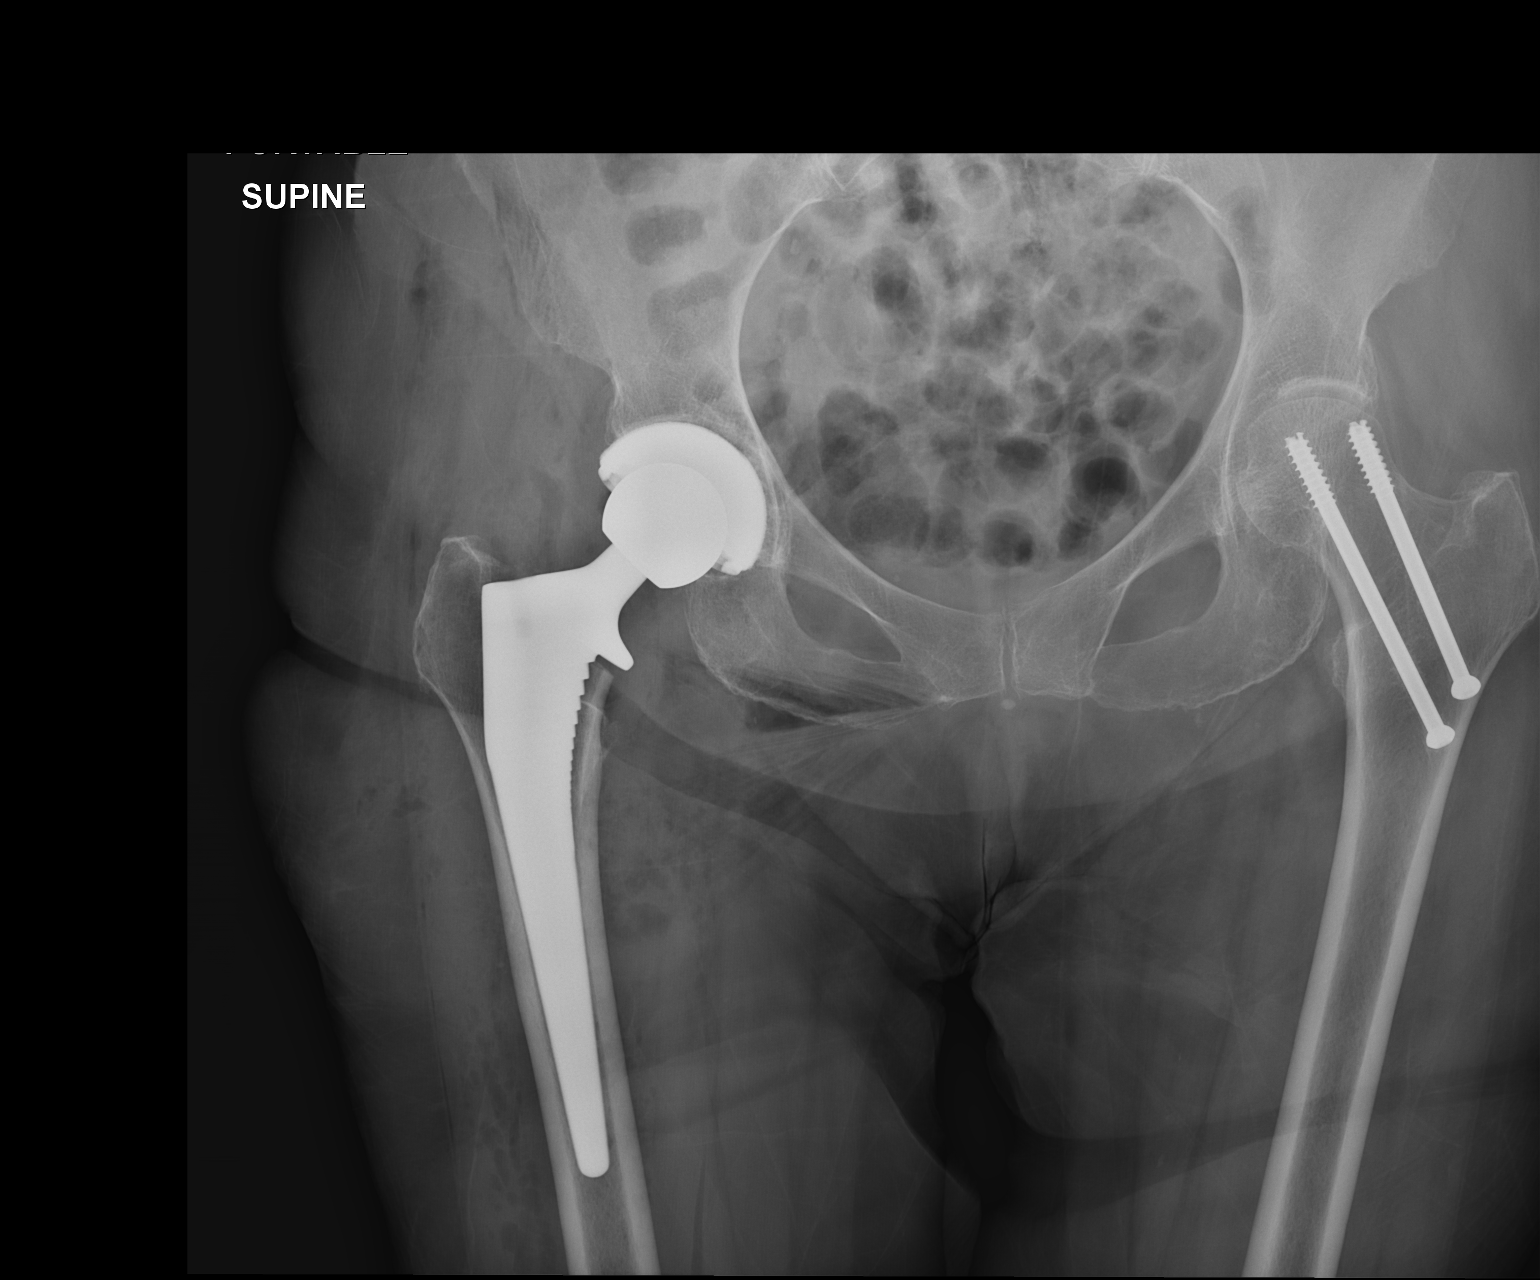

[1 of 1 positions shown; findings below may reference images not displayed]

FINDINGS: There is a total hip prosthesis on the right which is well seated.
There are 2 screws in the proximal left femur; prior fracture in
this area is healed. There is evidence of old healed trauma in the
medial pubis region on the left. No acute fracture or dislocation.
There is moderate osteoarthritic change in the left hip joint.
IMPRESSION: Total hip prosthesis on the right which is well seated.
Posttraumatic change on the left. Moderate narrowing left hip joint.
No acute fracture or dislocation.

## 2015-02-28 ENCOUNTER — Encounter: Payer: Self-pay | Admitting: Physician Assistant

## 2015-03-07 ENCOUNTER — Non-Acute Institutional Stay (SKILLED_NURSING_FACILITY): Payer: Medicare Other | Admitting: Nurse Practitioner

## 2015-03-07 DIAGNOSIS — E876 Hypokalemia: Secondary | ICD-10-CM | POA: Diagnosis not present

## 2015-03-07 DIAGNOSIS — I10 Essential (primary) hypertension: Secondary | ICD-10-CM | POA: Diagnosis not present

## 2015-03-07 DIAGNOSIS — E039 Hypothyroidism, unspecified: Secondary | ICD-10-CM | POA: Diagnosis not present

## 2015-03-07 DIAGNOSIS — K3184 Gastroparesis: Secondary | ICD-10-CM

## 2015-03-07 DIAGNOSIS — K5900 Constipation, unspecified: Secondary | ICD-10-CM | POA: Diagnosis not present

## 2015-03-07 DIAGNOSIS — F0280 Dementia in other diseases classified elsewhere without behavioral disturbance: Secondary | ICD-10-CM

## 2015-03-07 DIAGNOSIS — G309 Alzheimer's disease, unspecified: Secondary | ICD-10-CM | POA: Diagnosis not present

## 2015-03-07 NOTE — Progress Notes (Signed)
Patient ID: Crystal Petersen, female   DOB: 1921-08-18, 81 y.o.   MRN: [id number]  Location:  SNF [facility] Provider:  Marlana Latus NP  Code Status:  DNR Goals of care: Advanced Directive information    No chief complaint on file.    HPI: Patient is a 81 y.o. female seen in the SNF at [facility] today for evaluation of chronic medical conditions. Hx of GERD symptomatic, last TSH wnl 09/2014 while on Levothyroxine 131mcg, controlled blood pressure while taking Labetalol 100mg  bid, taking Namenda for memory loss, she is functioning adequately in SNF. Denied pain, constipation, or trouble sleeping at night.   Review of Systems  Constitutional: Negative for fever and chills.  HENT: Positive for hearing loss. Negative for congestion, ear discharge and ear pain.   Eyes: Negative for pain, discharge and redness.  Respiratory: Negative for cough, shortness of breath and wheezing.   Cardiovascular: Negative for chest pain and leg swelling.  Gastrointestinal: Negative for nausea, vomiting, abdominal pain and constipation.  Genitourinary: Positive for frequency. Negative for dysuria and urgency.  Musculoskeletal: Positive for back pain. Negative for myalgias and neck pain.  Skin: Negative for rash.  Neurological: Negative for dizziness, tremors, seizures, weakness and headaches.  Psychiatric/Behavioral: Positive for memory loss. Negative for suicidal ideas and hallucinations. The patient is nervous/anxious.     Past Medical History  Diagnosis Date  . Hypertension   . Nonspecific abnormal electrocardiogram (ECG) (EKG)   . Stricture and stenosis of esophagus   . Esophageal reflux   . Diverticulosis of colon (without mention of hemorrhage)   . Benign neoplasm of colon   . Macular degeneration   . Osteoarthritis     right hip  . Other and unspecified hyperlipidemia   . HTN (hypertension)   . Shingles   . Cystocele   . Gastroparesis   . Skin cancer (melanoma) ([place])   . Hypothyroidism     . Cancer ([place])     pre-melanoma  . Hemorrhoids   . Primary osteoarthritis of right hip 03/27/2014  . Acute blood loss anemia 03/29/2014    03/30/14 Hgb 7.3 04/04/14 Hgb 8.5 continue Fe bid.  05/04/14 Hgb 10.8   . Dementia of the Alzheimer's type 05/22/2014    05/19/14 MMSE 18/30 Namenda.    . Diastolic dysfunction [id number]  . Diverticulosis of large intestine 06/28/2004  . Essential hypertension 07/05/2013  . Hyperlipidemia 07/05/2013  . Internal hemorrhoids without mention of complication [id number]  . Prediabetes 11/17/2013  . Stricture and stenosis of esophagus 10/27/2007  . Vitamin D deficiency 11/17/2013    Patient Active Problem List   Diagnosis Date Noted  . Hypokalemia 03/15/2015  . Constipation 07/19/2014  . Dementia of the Alzheimer's type 05/22/2014  . Macular degeneration 05/22/2014  . Cystocele 05/22/2014  . Melanoma of skin ([place]) 05/22/2014  . Unstable gait 05/22/2014  . Nodule of left lung 05/22/2014  . Adenomatous colon polyp 05/22/2014  . Osteoporosis 05/22/2014  . History of herpes zoster 05/22/2014  . Gastroparesis 05/22/2014  . Deaf 05/22/2014  . Periprosthetic fracture around internal prosthetic right hip joint ([place]) 04/12/2014  . Primary osteoarthritis of right hip 03/27/2014  . Prediabetes 11/17/2013  . Vitamin D deficiency 11/17/2013  . Hyperlipidemia 07/05/2013  . Hypothyroidism 07/05/2013  . Essential hypertension 07/05/2013  . Diastolic dysfunction [id number]  . Stricture and stenosis of esophagus 10/27/2007  . GERD 10/25/2007  . Diverticulosis of large intestine 06/28/2004    Allergies  Allergen Reactions  . Ace Inhibitors  Elevated potassium  . Fosamax [Alendronate Sodium]   . Metoclopramide Hcl Other (See Comments)    Pt. Not sure  . Reglan [Metoclopramide]   . Statins Other (See Comments)    myalgia  . Sulfa Antibiotics   . Vesicare [Solifenacin]   . Vicodin [Hydrocodone-Acetaminophen] Other (See Comments)    "felt spaced out"  .  Sulfonamide Derivatives Rash    Medications: Patient's Medications  New Prescriptions   No medications on file  Previous Medications   ASPIRIN EC 81 MG TABLET    Take 81 mg by mouth daily.   CALCIUM CARBONATE (CAL-GEST ANTACID) 500 MG CHEWABLE TABLET    Chew 1 tablet by mouth. Chew one tablet up to four times daily as needed for indigestion   CHOLECALCIFEROL (VITAMIN D) 2000 UNITS TABLET    Take 4,000 Units by mouth daily.   DOCUSATE SODIUM (COLACE) 100 MG CAPSULE    Take 100 mg by mouth 2 (two) times daily as needed.    LABETALOL (NORMODYNE) 100 MG TABLET    TAKE 1 TABLET BY MOUTH TWICE A DAY   LEVOTHYROXINE (SYNTHROID, LEVOTHROID) 100 MCG TABLET    Take 100 mcg by mouth daily before breakfast.   MEMANTINE (NAMENDA XR) 28 MG CP24 24 HR CAPSULE    Take 28 mg by mouth daily.   MULTIPLE VITAMINS-MINERALS (PRESERVISION/LUTEIN) CAPS    Take by mouth. Take one twice daily   POTASSIUM CHLORIDE SA (K-DUR,KLOR-CON) 20 MEQ TABLET    Take 20 mEq by mouth daily.   SENNOSIDES-DOCUSATE SODIUM (SENOKOT-S) 8.6-50 MG TABLET    Take 1 tablet by mouth. One at bedtime  Modified Medications   No medications on file  Discontinued Medications   No medications on file    Physical Exam: Filed Vitals:   03/07/15 1540  BP: 145/82  Pulse: 70  Temp: 98.1 F (36.7 C)  TempSrc: Tympanic  Resp: 16   There is no weight on file to calculate BMI.  Physical Exam  Constitutional: She is oriented to person, place, and time. She appears well-developed and well-nourished. No distress.  HENT:  Head: Normocephalic and atraumatic.  Right Ear: External ear normal.  Mouth/Throat: No oropharyngeal exudate.  Eyes: Conjunctivae and EOM are normal. Pupils are equal, round, and reactive to light. Right eye exhibits no discharge. Left eye exhibits no discharge. No scleral icterus.  Neck: Normal range of motion. Neck supple. No JVD present. No tracheal deviation present. No thyromegaly present.  Cardiovascular: Normal  rate, regular rhythm, normal heart sounds and intact distal pulses.   No murmur heard. Pulmonary/Chest: Effort normal and breath sounds normal. No respiratory distress. She has no wheezes. She has no rales. She exhibits no tenderness.  Abdominal: Soft. Bowel sounds are normal. She exhibits no distension and no mass. There is no tenderness.  Musculoskeletal: Normal range of motion. She exhibits tenderness. She exhibits no edema.  Right hip pain with ROM.  Neurological: She is alert and oriented to person, place, and time. She has normal reflexes. No cranial nerve deficit. She exhibits normal muscle tone. Coordination normal.  Skin: Skin is dry. No rash noted. She is not diaphoretic. No pallor.  Right hip surgical incision healed.   Psychiatric: Her behavior is normal. Judgment and thought content normal. Her mood appears anxious. Her speech is not rapid and/or pressured, not delayed, not tangential and not slurred. Cognition and memory are impaired. She exhibits a depressed mood. She is communicative. She exhibits abnormal recent memory. She exhibits normal remote memory.  Labs reviewed: Basic Metabolic Panel:  Recent Labs  03/28/14 0458 03/29/14 1040  07/06/14 1726 07/20/14 09/28/14  NA 138 132*  < > 140 140 142  K 2.7* 3.5*  < > 4.3 4.0 3.8  CL 97 96  --  105  --   --   CO2 22 23  --  26  --   --   GLUCOSE 103* 104*  --  97  --   --   BUN 14 14  < > 20 19 16   CREATININE 0.56 0.71  < > 0.64 0.6 0.6  CALCIUM 7.9* 8.4  --  8.9  --   --   MG  --   --   --  1.9  --   --   < > = values in this interval not displayed.  Liver Function Tests:  Recent Labs  03/20/14 1300  07/06/14 1726 07/20/14 09/28/14  AST 18  < > 11 13 12*  ALT 12  < > 8 8 8   ALKPHOS 71  < > 68 54 55  BILITOT 0.3  --  0.3  --   --   PROT 6.5  --  5.7*  --   --   ALBUMIN 3.7  --  3.6  --   --   < > = values in this interval not displayed.  CBC:  Recent Labs  03/20/14 1300  03/29/14 0644 03/30/14 0535   07/06/14 1726 07/20/14 08/22/14 10/05/14  WBC 9.6  < > 10.4 8.4  < > 7.1 7.0 6.9 6.5  NEUTROABS 6.9  --   --   --   --  4.3  --   --   --   HGB 11.9*  < > 7.9* 7.3*  < > 12.2 11.8* 11.7* 12.4  HCT 37.0  < > 23.3* 22.7*  < > 38.2 36 36 37  MCV 83.7  < > 84.1 82.2  --  87.8  --   --   --   PLT 373  < > 203 234  < > 348 263 265 238  < > = values in this interval not displayed.  Lab Results  Component Value Date   TSH 2.19 09/28/2014   Lab Results  Component Value Date   HGBA1C 5.8* 07/06/2014   Lab Results  Component Value Date   CHOL 209* 07/06/2014   HDL 66 07/06/2014   LDLCALC 111* 07/06/2014   TRIG 160* 07/06/2014   CHOLHDL 3.2 07/06/2014    Significant Diagnostic Results since last visit: none  Patient Care Team: Unk Pinto, MD as PCP - General (Internal Medicine) Hurman Horn, MD as Consulting Physician (Ophthalmology) Burnell Blanks, MD as Consulting Physician (Cardiology) Inda Castle, MD as Consulting Physician (Gastroenterology) Jari Pigg, MD as Consulting Physician (Dermatology) Dorna Leitz, MD as Consulting Physician (Orthopedic Surgery)  Assessment/Plan Problem List Items Addressed This Visit    Hypothyroidism - Primary (Chronic)    Last TSH 2.188 09/2014, continue Levothyroxine 170mcg po daily. Update TSH and CBC      Essential hypertension (Chronic)    Controlled, continue Labetalol 100mg  bid.      Dementia of the Alzheimer's type (Chronic)    05/19/14 MMSE 18/30, continue Namenda, SNF for care needs      Gastroparesis (Chronic)    Stable, continue Famotidine 20mg       Constipation    Stable, continue Senokot S II nightly.       Hypokalemia    Taking Kcl  33meq daily, update CMP          Family/ staff Communication: continue SNF for care needs  Labs/tests ordered: CBC, CMP, TSH  ManXie Flannery Cavallero NP [facility] [address], [place] [address] On Call:  [phone] &  follow prompts after 5pm & weekends Office Phone:  [phone] Office Fax:  [phone]

## 2015-03-15 DIAGNOSIS — E876 Hypokalemia: Secondary | ICD-10-CM | POA: Insufficient documentation

## 2015-03-15 NOTE — Assessment & Plan Note (Signed)
Stable, continue Senokot S II nightly.  

## 2015-03-15 NOTE — Assessment & Plan Note (Signed)
Taking Kcl 33meq daily, update CMP

## 2015-03-15 NOTE — Assessment & Plan Note (Signed)
Stable, continue Famotidine 20mg  

## 2015-03-15 NOTE — Assessment & Plan Note (Signed)
Controlled, continue Labetalol 100mg bid 

## 2015-03-15 NOTE — Assessment & Plan Note (Signed)
05/19/14 MMSE 18/30, continue Namenda, SNF for care needs 

## 2015-03-15 NOTE — Assessment & Plan Note (Addendum)
Last TSH 2.188 09/2014, continue Levothyroxine 182mcg po daily. Update TSH and CBC

## 2015-03-23 ENCOUNTER — Encounter: Payer: Self-pay | Admitting: Internal Medicine

## 2015-03-23 ENCOUNTER — Non-Acute Institutional Stay (SKILLED_NURSING_FACILITY): Payer: Medicare Other | Admitting: Internal Medicine

## 2015-03-23 DIAGNOSIS — D649 Anemia, unspecified: Secondary | ICD-10-CM | POA: Diagnosis not present

## 2015-03-23 DIAGNOSIS — E039 Hypothyroidism, unspecified: Secondary | ICD-10-CM | POA: Diagnosis not present

## 2015-03-23 DIAGNOSIS — K219 Gastro-esophageal reflux disease without esophagitis: Secondary | ICD-10-CM

## 2015-03-23 DIAGNOSIS — F0280 Dementia in other diseases classified elsewhere without behavioral disturbance: Secondary | ICD-10-CM

## 2015-03-23 DIAGNOSIS — G309 Alzheimer's disease, unspecified: Secondary | ICD-10-CM | POA: Diagnosis not present

## 2015-03-23 DIAGNOSIS — K622 Anal prolapse: Secondary | ICD-10-CM

## 2015-03-23 DIAGNOSIS — I10 Essential (primary) hypertension: Secondary | ICD-10-CM | POA: Diagnosis not present

## 2015-03-23 DIAGNOSIS — E876 Hypokalemia: Secondary | ICD-10-CM | POA: Diagnosis not present

## 2015-03-23 NOTE — Progress Notes (Signed)
Patient ID: Crystal Petersen, female   DOB: 1922-02-02, 81 y.o.   MRN: [id number]    [place] Room Number: 74  Place of Service: SNF (31)     Allergies  Allergen Reactions  . Ace Inhibitors     Elevated potassium  . Fosamax [Alendronate Sodium]   . Metoclopramide Hcl Other (See Comments)    Pt. Not sure  . Reglan [Metoclopramide]   . Statins Other (See Comments)    myalgia  . Sulfa Antibiotics   . Vesicare [Solifenacin]   . Vicodin [Hydrocodone-Acetaminophen] Other (See Comments)    "felt spaced out"  . Sulfonamide Derivatives Rash    Chief Complaint  Patient presents with  . Acute Visit    Lab abnormalities: Anemia, hypokalemia    HPI:  Patient was seen on routine visitations Crystal Petersen on 03/15/2015. Routine lab was requested at that time. Upon return of this lab, she showed evidence of a mild anemia at hemoglobin 11.4 g percent with a normal MCV of 86.9 she also had a significant elevation of white blood cells 19,100. Complete metabolic panel showed low potassium at 3.3. Total protein and albumin were also slightly low. Calcium were scored low at 8.0. TSH normal at 1.804. Patient was not ill at the time blood was drawn and did not have a fever. There has been no cough or congestion. She denies dysuria.  Medications: Patient's Medications  New Prescriptions   No medications on file  Previous Medications   ASPIRIN EC 81 MG TABLET    Take 81 mg by mouth daily.   CALCIUM CARBONATE (CAL-GEST ANTACID) 500 MG CHEWABLE TABLET    Chew 1 tablet by mouth. Chew one tablet up to four times daily as needed for indigestion   CHOLECALCIFEROL (VITAMIN D) 2000 UNITS TABLET    Take 4,000 Units by mouth daily.   DOCUSATE SODIUM (COLACE) 100 MG CAPSULE    Take 100 mg by mouth 2 (two) times daily as needed.    LABETALOL (NORMODYNE) 100 MG TABLET    TAKE 1 TABLET BY MOUTH TWICE A DAY   LEVOTHYROXINE (SYNTHROID, LEVOTHROID) 100 MCG TABLET    Take 100 mcg by  mouth daily before breakfast.   MEMANTINE (NAMENDA XR) 28 MG CP24 24 HR CAPSULE    Take 28 mg by mouth daily.   MULTIPLE VITAMINS-MINERALS (PRESERVISION/LUTEIN) CAPS    Take by mouth. Take one twice daily   POTASSIUM CHLORIDE SA (K-DUR,KLOR-CON) 20 MEQ TABLET    Take 20 mEq by mouth daily.   SENNOSIDES-DOCUSATE SODIUM (SENOKOT-S) 8.6-50 MG TABLET    Take 1 tablet by mouth. One at bedtime  Modified Medications   No medications on file  Discontinued Medications   No medications on file     Review of Systems  Constitutional: Negative for fever, chills, diaphoresis, activity change, appetite change, fatigue and unexpected weight change.  HENT: Positive for hearing loss. Negative for congestion, ear discharge, ear pain, postnasal drip, rhinorrhea, sore throat, tinnitus, trouble swallowing and voice change.   Eyes: Negative for pain, discharge, redness, itching and visual disturbance.  Respiratory: Negative for cough, choking, shortness of breath and wheezing.   Cardiovascular: Negative for chest pain, palpitations and leg swelling.  Gastrointestinal: Negative for nausea, vomiting, abdominal pain, diarrhea, constipation and abdominal distention.  Endocrine: Negative for cold intolerance, heat intolerance, polydipsia, polyphagia and polyuria.       Compensated hypothyroidism. Potassium 3.3 on 03/20/2015.  Genitourinary: Positive for frequency. Negative for dysuria, urgency,  hematuria, flank pain, vaginal discharge, difficulty urinating and pelvic pain.       Anal prolapse. Small amount of bleeding recently.  Musculoskeletal: Positive for back pain. Negative for myalgias, arthralgias, gait problem, neck pain and neck stiffness.  Skin: Negative for color change, pallor and rash.  Allergic/Immunologic: Negative.   Neurological: Negative for dizziness, tremors, seizures, syncope, weakness, numbness and headaches.       Alzheimer's disease  Hematological: Negative for adenopathy. Does not  bruise/bleed easily.       Hemoglobin 11.4 on 03/20/2015. MCV 86.9.  Psychiatric/Behavioral: Positive for confusion. Negative for suicidal ideas, hallucinations, behavioral problems, sleep disturbance, dysphoric mood and agitation. The patient is nervous/anxious. The patient is not hyperactive.     Filed Vitals:   03/23/15 1114  BP: 120/70  Pulse: 80  Temp: 98 F (36.7 C)  Resp: 16  Height: _0  (1.549 m)  Weight: 133 lb 8 oz (60.555 kg)   Body mass index is 25.24 kg/(m^2).  Physical Exam  Constitutional: She is oriented to person, place, and time. She appears well-developed and well-nourished. No distress.  HENT:  Head: Normocephalic and atraumatic.  Right Ear: External ear normal.  Left Ear: External ear normal.  Nose: Nose normal.  Mouth/Throat: Oropharynx is clear and moist. No oropharyngeal exudate.  Bilateral loss of hearing and use of aids  Eyes: Conjunctivae and EOM are normal. Pupils are equal, round, and reactive to light. Right eye exhibits no discharge. Left eye exhibits no discharge. No scleral icterus.  Neck: Normal range of motion. Neck supple. No JVD present. No tracheal deviation present. No thyromegaly present.  Cardiovascular: Normal rate, regular rhythm, normal heart sounds and intact distal pulses.  Exam reveals no gallop and no friction rub.   No murmur heard. Pulmonary/Chest: Effort normal and breath sounds normal. No respiratory distress. She has no wheezes. She has no rales. She exhibits no tenderness.  Abdominal: Soft. Bowel sounds are normal. She exhibits no distension and no mass. There is no tenderness.  Musculoskeletal: Normal range of motion. She exhibits tenderness. She exhibits no edema.  Right hip pain with ROM.  Lymphadenopathy:    She has no cervical adenopathy.  Neurological: She is alert and oriented to person, place, and time. She has normal reflexes. No cranial nerve deficit. She exhibits normal muscle tone. Coordination normal.  Memory  impaired.  Skin: Skin is dry. No rash noted. She is not diaphoretic. No erythema. No pallor.  Right hip surgical incision healed.   Psychiatric: Her behavior is normal. Judgment and thought content normal. Her mood appears not anxious. Her speech is not rapid and/or pressured, not delayed, not tangential and not slurred. Cognition and memory are impaired. She does not exhibit a depressed mood. She is communicative. She exhibits abnormal recent memory and abnormal remote memory.     Labs reviewed: Lab Summary Latest Ref Rng 10/05/2014 09/28/2014 08/22/2014 07/20/2014 07/06/2014  Hemoglobin 12.0 - 16.0 g/dL 12.4 (None) 11.7(A) 11.8(A) 12.2  Hematocrit 36 - 46 % 37 (None) 36 36 38.2  White count - 6.5 (None) 6.9 7.0 7.1  Platelet count 150 - 399 K/L 238 (None) 265 263 348  Sodium 137 - 147 mmol/L (None) 142 (None) 140 140  Potassium 3.4 - 5.3 mmol/L (None) 3.8 (None) 4.0 4.3  Calcium 8.4 - 10.5 mg/dL (None) (None) (None) (None) 8.9  Phosphorus - (None) (None) (None) (None) (None)  Creatinine 0.5 - 1.1 mg/dL (None) 0.6 (None) 0.6 0.64  AST 13 - 35 U/L (None) 12(A) (  None) 13 11  Alk Phos 25 - 125 U/L (None) 55 (None) 54 68  Bilirubin 0.2 - 1.2 mg/dL (None) (None) (None) (None) 0.3  Glucose - (None) 77 (None) 74 97  Cholesterol 0 - 200 mg/dL (None) (None) (None) (None) 209(H)  HDL cholesterol >=46 mg/dL (None) (None) (None) (None) 66  Triglycerides <150 mg/dL (None) (None) (None) (None) 160(H)  LDL Direct - (None) (None) (None) (None) (None)  LDL Calc 0 - 99 mg/dL (None) (None) (None) (None) 111(H)  Total protein 6.0 - 8.3 g/dL (None) (None) (None) (None) 5.7(L)  Albumin 3.5 - 5.2 g/dL (None) (None) (None) (None) 3.6   Lab Results  Component Value Date   TSH 2.19 09/28/2014   Lab Results  Component Value Date   BUN 16 09/28/2014   Lab Results  Component Value Date   HGBA1C 5.8* 07/06/2014       Assessment/Plan  1. Anal prolapse Small amount of rectal bleeding noted in nursing  notes. Patient denies tenderness or discomfort.  2. Anemia, unspecified anemia type -CBC, reticulocyte, future  3. Hypokalemia -BMP, future  4. Dementia of the Alzheimer's type Stable  5. Essential hypertension Controlled  6. Gastroesophageal reflux disease without esophagitis Asymptomatic at this time  7. Hypothyroidism, unspecified hypothyroidism type Recent TSH normal

## 2015-03-27 LAB — BASIC METABOLIC PANEL
BUN: 11 mg/dL (ref 4–21)
CREATININE: 0.6 mg/dL (ref 0.5–1.1)
GLUCOSE: 79 mg/dL
POTASSIUM: 3.7 mmol/L (ref 3.4–5.3)
SODIUM: 140 mmol/L (ref 137–147)

## 2015-03-27 LAB — CBC AND DIFFERENTIAL
HCT: 24 % — AB (ref 36–46)
HEMOGLOBIN: 11.5 g/dL — AB (ref 12.0–16.0)
Platelets: 369 10*3/uL (ref 150–399)
WBC: 11.4 10*3/mL

## 2015-03-28 ENCOUNTER — Other Ambulatory Visit: Payer: Self-pay | Admitting: Nurse Practitioner

## 2015-03-28 DIAGNOSIS — D649 Anemia, unspecified: Secondary | ICD-10-CM

## 2015-03-28 DIAGNOSIS — E876 Hypokalemia: Secondary | ICD-10-CM

## 2015-04-07 ENCOUNTER — Emergency Department (HOSPITAL_COMMUNITY): Payer: Medicare Other

## 2015-04-07 ENCOUNTER — Inpatient Hospital Stay (HOSPITAL_COMMUNITY)
Admission: EM | Admit: 2015-04-07 | Discharge: 2015-04-09 | DRG: 292 | Disposition: A | Discharge status: Skilled Nursing Facility | Payer: Medicare Other | Attending: Internal Medicine | Admitting: Internal Medicine

## 2015-04-07 ENCOUNTER — Encounter (HOSPITAL_COMMUNITY): Payer: Self-pay | Admitting: Emergency Medicine

## 2015-04-07 DIAGNOSIS — Z9049 Acquired absence of other specified parts of digestive tract: Secondary | ICD-10-CM

## 2015-04-07 DIAGNOSIS — F0280 Dementia in other diseases classified elsewhere without behavioral disturbance: Secondary | ICD-10-CM | POA: Diagnosis present

## 2015-04-07 DIAGNOSIS — Z833 Family history of diabetes mellitus: Secondary | ICD-10-CM | POA: Diagnosis not present

## 2015-04-07 DIAGNOSIS — R079 Chest pain, unspecified: Secondary | ICD-10-CM | POA: Diagnosis present

## 2015-04-07 DIAGNOSIS — Z7982 Long term (current) use of aspirin: Secondary | ICD-10-CM

## 2015-04-07 DIAGNOSIS — Z800 Family history of malignant neoplasm of digestive organs: Secondary | ICD-10-CM

## 2015-04-07 DIAGNOSIS — R0602 Shortness of breath: Secondary | ICD-10-CM

## 2015-04-07 DIAGNOSIS — H3530 Unspecified macular degeneration: Secondary | ICD-10-CM | POA: Diagnosis present

## 2015-04-07 DIAGNOSIS — Z9889 Other specified postprocedural states: Secondary | ICD-10-CM

## 2015-04-07 DIAGNOSIS — Z882 Allergy status to sulfonamides status: Secondary | ICD-10-CM

## 2015-04-07 DIAGNOSIS — K219 Gastro-esophageal reflux disease without esophagitis: Secondary | ICD-10-CM | POA: Diagnosis present

## 2015-04-07 DIAGNOSIS — R0789 Other chest pain: Secondary | ICD-10-CM

## 2015-04-07 DIAGNOSIS — Z66 Do not resuscitate: Secondary | ICD-10-CM | POA: Diagnosis present

## 2015-04-07 DIAGNOSIS — Z79899 Other long term (current) drug therapy: Secondary | ICD-10-CM | POA: Diagnosis not present

## 2015-04-07 DIAGNOSIS — G309 Alzheimer's disease, unspecified: Secondary | ICD-10-CM | POA: Diagnosis present

## 2015-04-07 DIAGNOSIS — Z888 Allergy status to other drugs, medicaments and biological substances status: Secondary | ICD-10-CM

## 2015-04-07 DIAGNOSIS — Z96641 Presence of right artificial hip joint: Secondary | ICD-10-CM | POA: Diagnosis present

## 2015-04-07 DIAGNOSIS — J948 Other specified pleural conditions: Secondary | ICD-10-CM

## 2015-04-07 DIAGNOSIS — N390 Urinary tract infection, site not specified: Secondary | ICD-10-CM | POA: Diagnosis present

## 2015-04-07 DIAGNOSIS — E039 Hypothyroidism, unspecified: Secondary | ICD-10-CM | POA: Diagnosis present

## 2015-04-07 DIAGNOSIS — Z885 Allergy status to narcotic agent status: Secondary | ICD-10-CM | POA: Diagnosis not present

## 2015-04-07 DIAGNOSIS — Z85820 Personal history of malignant melanoma of skin: Secondary | ICD-10-CM

## 2015-04-07 DIAGNOSIS — E038 Other specified hypothyroidism: Secondary | ICD-10-CM

## 2015-04-07 DIAGNOSIS — J90 Pleural effusion, not elsewhere classified: Secondary | ICD-10-CM | POA: Diagnosis present

## 2015-04-07 DIAGNOSIS — I509 Heart failure, unspecified: Secondary | ICD-10-CM | POA: Diagnosis not present

## 2015-04-07 DIAGNOSIS — M1611 Unilateral primary osteoarthritis, right hip: Secondary | ICD-10-CM | POA: Diagnosis present

## 2015-04-07 DIAGNOSIS — I5033 Acute on chronic diastolic (congestive) heart failure: Secondary | ICD-10-CM | POA: Diagnosis present

## 2015-04-07 DIAGNOSIS — R319 Hematuria, unspecified: Secondary | ICD-10-CM

## 2015-04-07 DIAGNOSIS — Z8249 Family history of ischemic heart disease and other diseases of the circulatory system: Secondary | ICD-10-CM | POA: Diagnosis not present

## 2015-04-07 DIAGNOSIS — I10 Essential (primary) hypertension: Secondary | ICD-10-CM | POA: Diagnosis present

## 2015-04-07 DIAGNOSIS — B9620 Unspecified Escherichia coli [E. coli] as the cause of diseases classified elsewhere: Secondary | ICD-10-CM | POA: Diagnosis present

## 2015-04-07 DIAGNOSIS — I5032 Chronic diastolic (congestive) heart failure: Secondary | ICD-10-CM | POA: Diagnosis present

## 2015-04-07 LAB — I-STAT TROPONIN, ED
TROPONIN I, POC: 0 ng/mL (ref 0.00–0.08)
TROPONIN I, POC: 0 ng/mL (ref 0.00–0.08)

## 2015-04-07 LAB — COMPREHENSIVE METABOLIC PANEL
ALT: 9 U/L — ABNORMAL LOW (ref 14–54)
ANION GAP: 10 (ref 5–15)
AST: 16 U/L (ref 15–41)
Albumin: 3 g/dL — ABNORMAL LOW (ref 3.5–5.0)
Alkaline Phosphatase: 81 U/L (ref 38–126)
BILIRUBIN TOTAL: 0.7 mg/dL (ref 0.3–1.2)
BUN: 17 mg/dL (ref 6–20)
CHLORIDE: 104 mmol/L (ref 101–111)
CO2: 25 mmol/L (ref 22–32)
Calcium: 8.9 mg/dL (ref 8.9–10.3)
Creatinine, Ser: 0.75 mg/dL (ref 0.44–1.00)
Glucose, Bld: 102 mg/dL — ABNORMAL HIGH (ref 65–99)
POTASSIUM: 4.2 mmol/L (ref 3.5–5.1)
Sodium: 139 mmol/L (ref 135–145)
TOTAL PROTEIN: 6.2 g/dL — AB (ref 6.5–8.1)

## 2015-04-07 LAB — URINALYSIS, ROUTINE W REFLEX MICROSCOPIC
BILIRUBIN URINE: NEGATIVE
Glucose, UA: NEGATIVE mg/dL
KETONES UR: NEGATIVE mg/dL
NITRITE: POSITIVE — AB
PH: 6.5 (ref 5.0–8.0)
Protein, ur: NEGATIVE mg/dL
Specific Gravity, Urine: 1.01 (ref 1.005–1.030)

## 2015-04-07 LAB — CBC WITH DIFFERENTIAL/PLATELET
BASOS ABS: 0 10*3/uL (ref 0.0–0.1)
Basophils Relative: 0 %
EOS PCT: 7 %
Eosinophils Absolute: 0.6 10*3/uL (ref 0.0–0.7)
HEMATOCRIT: 38.1 % (ref 36.0–46.0)
Hemoglobin: 12 g/dL (ref 12.0–15.0)
LYMPHS PCT: 17 %
Lymphs Abs: 1.4 10*3/uL (ref 0.7–4.0)
MCH: 28.8 pg (ref 26.0–34.0)
MCHC: 31.5 g/dL (ref 30.0–36.0)
MCV: 91.6 fL (ref 78.0–100.0)
MONO ABS: 0.8 10*3/uL (ref 0.1–1.0)
MONOS PCT: 9 %
NEUTROS ABS: 5.7 10*3/uL (ref 1.7–7.7)
Neutrophils Relative %: 67 %
PLATELETS: 378 10*3/uL (ref 150–400)
RBC: 4.16 MIL/uL (ref 3.87–5.11)
RDW: 14 % (ref 11.5–15.5)
WBC: 8.5 10*3/uL (ref 4.0–10.5)

## 2015-04-07 LAB — BRAIN NATRIURETIC PEPTIDE: B Natriuretic Peptide: 117.8 pg/mL — ABNORMAL HIGH (ref 0.0–100.0)

## 2015-04-07 LAB — URINE MICROSCOPIC-ADD ON

## 2015-04-07 MED ORDER — ACETAMINOPHEN 325 MG PO TABS: 650.0000 mg | ORAL_TABLET | ORAL | Status: DC | PRN

## 2015-04-07 MED ORDER — SODIUM CHLORIDE 0.9 % IJ SOLN
3.0000 mL | Freq: Two times a day (BID) | INTRAMUSCULAR | Status: DC
  Administered 2015-04-07 – 2015-04-09 (×2): 3 mL via INTRAVENOUS

## 2015-04-07 MED ORDER — POTASSIUM CHLORIDE CRYS ER 10 MEQ PO TBCR
10.0000 meq | EXTENDED_RELEASE_TABLET | Freq: Every day | ORAL | Status: DC
  Administered 2015-04-07 – 2015-04-09 (×3): 10 meq via ORAL
  Filled 2015-04-07 (×3): qty 1

## 2015-04-07 MED ORDER — SODIUM CHLORIDE 0.9 % IJ SOLN: 3.0000 mL | INTRAMUSCULAR | Status: DC | PRN

## 2015-04-07 MED ORDER — HYDRALAZINE HCL 20 MG/ML IJ SOLN
5.0000 mg | Freq: Four times a day (QID) | INTRAMUSCULAR | Status: DC | PRN
  Administered 2015-04-07: 5 mg via INTRAVENOUS
  Filled 2015-04-07: qty 1

## 2015-04-07 MED ORDER — ONDANSETRON HCL 4 MG/2ML IJ SOLN: 4.0000 mg | Freq: Four times a day (QID) | INTRAMUSCULAR | Status: DC | PRN

## 2015-04-07 MED ORDER — CEPHALEXIN 250 MG PO CAPS
500.0000 mg | ORAL_CAPSULE | Freq: Once | ORAL | Status: AC
  Administered 2015-04-07: 500 mg via ORAL
  Filled 2015-04-07: qty 2

## 2015-04-07 MED ORDER — FUROSEMIDE 10 MG/ML IJ SOLN
40.0000 mg | Freq: Every day | INTRAMUSCULAR | Status: AC
  Administered 2015-04-07 – 2015-04-08 (×2): 40 mg via INTRAVENOUS
  Filled 2015-04-07 (×2): qty 4

## 2015-04-07 MED ORDER — ACETAMINOPHEN 325 MG PO TABS
650.0000 mg | ORAL_TABLET | Freq: Once | ORAL | Status: AC
  Administered 2015-04-07: 650 mg via ORAL
  Filled 2015-04-07: qty 2

## 2015-04-07 MED ORDER — SODIUM CHLORIDE 0.9 % IV SOLN: 250.0000 mL | INTRAVENOUS | Status: DC | PRN

## 2015-04-07 MED ORDER — IOHEXOL 300 MG/ML  SOLN
75.0000 mL | Freq: Once | INTRAMUSCULAR | Status: AC | PRN
  Administered 2015-04-07: 75 mL via INTRAVENOUS

## 2015-04-07 MED ORDER — PHENAZOPYRIDINE HCL 100 MG PO TABS
95.0000 mg | ORAL_TABLET | Freq: Once | ORAL | Status: AC
  Administered 2015-04-07: 100 mg via ORAL
  Filled 2015-04-07: qty 1

## 2015-04-07 NOTE — ED Notes (Signed)
Xray at bedside and pt's family at bedside

## 2015-04-07 NOTE — ED Notes (Signed)
Taken to CT at this time.  Physician aware that urine resulted.  Waiting for orders.

## 2015-04-07 NOTE — ED Notes (Signed)
Attempted report at this time.  Nurse to call back.  Pt from [facility] at [facility].  DNR form at the bedside.  Family at the bedside as well.

## 2015-04-07 NOTE — H&P (Signed)
[facility] Admission History and Physical       Crystal Petersen [id number] DOB: 1921/10/12 DOA: 04/07/2015  Referring physician: EDP PCP: Alesia Richards, MD  Specialists:   Chief Complaint: Chest Pain  HPI: Crystal Petersen is a 81 y.o. female with a history of Diastolic dysfunction, HTN, Hypothyroid and Alzheimer's Dementia who was sent  from the [facility] to the ED due to complaints of chest pain this afternoon, and ABD and low back pain this AM.  She was evalauted in the ED and was found to have a UTI, and was placed on Keflex.  A Chest X-ray revealed a Bilateral Pleuarl Effusions, with the left greater than right.  The initial cardiac workup has been negative.       Review of Systems:  Constitutional: No Weight Loss, No Weight Gain, Night Sweats, Fevers, Chills, Dizziness, Light Headedness, Fatigue, or Generalized Weakness HEENT: No Headaches, Difficulty Swallowing,Tooth/Dental Problems,Sore Throat,  No Sneezing, Rhinitis, Ear Ache, Nasal Congestion, or Post Nasal Drip,  Cardio-vascular:  +Chest pain, Orthopnea, PND, Edema in Lower Extremities, Anasarca, Dizziness, Palpitations  Resp: No Dyspnea, No DOE, No Productive Cough, No Non-Productive Cough, No Hemoptysis, No Wheezing.    GI: No Heartburn, Indigestion, Abdominal Pain, Nausea, Vomiting, Diarrhea, Constipation, Hematemesis, Hematochezia, Melena, Change in Bowel Habits,  Loss of Appetite  GU: No Dysuria, No Change in Color of Urine, No Urgency or Urinary Frequency, No Flank pain.  Musculoskeletal: No Joint Pain or Swelling, No Decreased Range of Motion,  +Back Pain.  Neurologic: No Syncope, No Seizures, Muscle Weakness, Paresthesia, Vision Disturbance or Loss, No Diplopia, No Vertigo, No Difficulty Walking,  Skin: No Rash or Lesions. Psych: No Change in Mood or Affect, No Depression or Anxiety, No Memory loss, No Confusion, or Hallucinations   Past Medical History  Diagnosis Date  . Hypertension    . Nonspecific abnormal electrocardiogram (ECG) (EKG)   . Stricture and stenosis of esophagus   . Esophageal reflux   . Diverticulosis of colon (without mention of hemorrhage)   . Benign neoplasm of colon   . Macular degeneration   . Osteoarthritis     right hip  . Other and unspecified hyperlipidemia   . HTN (hypertension)   . Shingles   . Cystocele   . Gastroparesis   . Skin cancer (melanoma) ([place])   . Hypothyroidism   . Cancer ([place])     pre-melanoma  . Hemorrhoids   . Primary osteoarthritis of right hip 03/27/2014  . Acute blood loss anemia 03/29/2014    03/30/14 Hgb 7.3 04/04/14 Hgb 8.5 continue Fe bid.  05/04/14 Hgb 10.8   . Dementia of the Alzheimer's type 05/22/2014    05/19/14 MMSE 18/30 Namenda.    . Diastolic dysfunction [id number]  . Diverticulosis of large intestine 06/28/2004  . Essential hypertension 07/05/2013  . Hyperlipidemia 07/05/2013  . Internal hemorrhoids without mention of complication [id number]  . Prediabetes 11/17/2013  . Stricture and stenosis of esophagus 10/27/2007  . Vitamin D deficiency 11/17/2013     Past Surgical History  Procedure Laterality Date  . Cholecystectomy    . Gallbladder surgery  2002  . Hemorrhoid surgery    . Appendectomy    . Thyroidectomy    . Left eye surgery    . Cataract extraction, bilateral    . Melenoma resection      right arm  . Bcc resection      legs/face  . Tonsillectomy    . Flexible sigmoidoscopy  05/12/2011  Procedure: FLEXIBLE SIGMOIDOSCOPY;  Surgeon: Inda Castle, MD;  Location: [facility];  Service: Endoscopy;  Laterality: N/A;  . Flexible sigmoidoscopy  09/10/2011    Procedure: FLEXIBLE SIGMOIDOSCOPY;  Surgeon: Inda Castle, MD;  Location: [facility];  Service: Endoscopy;  Laterality: N/A;  . Fracture surgery      left hip pin  . Flexible sigmoidoscopy N/A 08/16/2012    Procedure: FLEXIBLE SIGMOIDOSCOPY;  Surgeon: Inda Castle, MD;  Location: [facility];  Service: Endoscopy;  Laterality: N/A;  .  Hemorrhoid banding N/A 08/16/2012    Procedure: HEMORRHOID BANDING;  Surgeon: Inda Castle, MD;  Location: [facility];  Service: Endoscopy;  Laterality: N/A;  . Total hip arthroplasty Right 03/27/2014    Procedure: TOTAL HIP ARTHROPLASTY ANTERIOR APPROACH;  Surgeon: Alta Corning, MD;  Location: [facility];  Service: Orthopedics;  Laterality: Right;      Prior to Admission medications   Medication Sig Start Date End Date Taking? Authorizing Provider  aspirin EC 81 MG tablet Take 81 mg by mouth daily.   Yes Historical Provider, MD  calcium carbonate (CAL-GEST ANTACID) 500 MG chewable tablet Chew 1 tablet by mouth daily as needed for indigestion or heartburn.   Yes Historical Provider, MD  Cholecalciferol (VITAMIN D) 2000 UNITS tablet Take 4,000 Units by mouth daily.   Yes Historical Provider, MD  docusate sodium (COLACE) 100 MG capsule Take 100 mg by mouth 2 (two) times daily as needed for mild constipation.    Yes Historical Provider, MD  famotidine (PEPCID) 20 MG tablet Take 20 mg by mouth at bedtime.   Yes Historical Provider, MD  guaiFENesin (ROBITUSSIN) 100 MG/5ML SOLN Take 10 mLs by mouth every 6 (six) hours as needed for cough or to loosen phlegm.   Yes Historical Provider, MD  labetalol (NORMODYNE) 100 MG tablet TAKE 1 TABLET BY MOUTH TWICE A DAY 05/29/14  Yes Unk Pinto, MD  levothyroxine (SYNTHROID, LEVOTHROID) 100 MCG tablet Take 100 mcg by mouth daily before breakfast.   Yes Historical Provider, MD  memantine (NAMENDA XR) 28 MG CP24 24 hr capsule Take 28 mg by mouth daily.   Yes Historical Provider, MD  Multiple Vitamins-Minerals (PRESERVISION AREDS 2) CAPS Take 1 capsule by mouth 2 (two) times daily.   Yes Historical Provider, MD  potassium chloride SA (K-DUR,KLOR-CON) 20 MEQ tablet Take 20 mEq by mouth daily.   Yes Historical Provider, MD  sennosides-docusate sodium (SENOKOT-S) 8.6-50 MG tablet Take 2 tablets by mouth at bedtime. One at bedtime   Yes Historical Provider, MD      Allergies  Allergen Reactions  . Ace Inhibitors Other (See Comments)    Elevated potassium  . Statins Other (See Comments)    Myalgias   . Vicodin [Hydrocodone-Acetaminophen] Other (See Comments)    "felt spaced out"  . Fosamax [Alendronate Sodium] Other (See Comments)  . Metoclopramide Hcl Other (See Comments)  . Sulfa Antibiotics Rash  . Sulfonamide Derivatives Rash  . Vesicare [Solifenacin] Other (See Comments)    Social History:  reports that she has never smoked. She has never used smokeless tobacco. She reports that she does not drink alcohol or use illicit drugs.    Family History  Problem Relation Age of Onset  . Esophageal cancer Brother   . Heart disease Father   . Hypertension Father   . Heart attack Father   . Colon cancer Neg Hx   . Malignant hyperthermia Neg Hx   . Diabetes Daughter  Physical Exam:  GEN:  Pleasant Elderly Well Nourished and Well Developed 81 y.o. Caucasian female examined and in no acute distress; cooperative with exam Filed Vitals:   04/07/15 2015 04/07/15 2030 04/07/15 2100 04/07/15 2130  BP: 210/100 206/97 145/81 189/95  Pulse: 78 42 81 76  Temp:      TempSrc:      Resp: 22 20 22 19   Height:      SpO2: 96% 93% 93% 95%   Blood pressure 189/95, pulse 76, temperature 98 F (36.7 C), temperature source Oral, resp. rate 19, height 5\' 5"  (1.651 m), SpO2 95 %. PSYCH: She is alert and oriented x 2; does not appear anxious does not appear depressed; affect is normal HEENT: Normocephalic and Atraumatic, Mucous membranes pink; PERRLA; EOM intact; Fundi:  Benign;  No scleral icterus, Nares: Patent, Oropharynx: Clear, Fair Dentition,    Neck:  FROM, No Cervical Lymphadenopathy nor Thyromegaly or Carotid Bruit; No JVD; Breasts:: Not examined CHEST WALL: No tenderness CHEST: Normal respiration, clear to auscultation bilaterally HEART: Regular rate and rhythm; no murmurs rubs or gallops BACK: No kyphosis or scoliosis; No CVA  tenderness ABDOMEN: Positive Bowel Sounds, Soft Non-Tender, No Rebound or Guarding; No Masses, No Organomegaly. Rectal Exam: Not done EXTREMITIES: No Cyanosis, Clubbing, or Edema; No Ulcerations. Genitalia: not examined PULSES: 2+ and symmetric SKIN: Normal hydration no rash or ulceration CNS:  Alert and Oriented x 2, No Focal Deficits Vascular: pulses palpable throughout    Labs on Admission:  Basic Metabolic Panel:  Recent Labs Lab 04/07/15 1635  NA 139  K 4.2  CL 104  CO2 25  GLUCOSE 102*  BUN 17  CREATININE 0.75  CALCIUM 8.9   Liver Function Tests:  Recent Labs Lab 04/07/15 1635  AST 16  ALT 9*  ALKPHOS 81  BILITOT 0.7  PROT 6.2*  ALBUMIN 3.0*   No results for input(s): LIPASE, AMYLASE in the last 168 hours. No results for input(s): AMMONIA in the last 168 hours. CBC:  Recent Labs Lab 04/07/15 1635  WBC 8.5  NEUTROABS 5.7  HGB 12.0  HCT 38.1  MCV 91.6  PLT 378   Cardiac Enzymes: No results for input(s): CKTOTAL, CKMB, CKMBINDEX, TROPONINI in the last 168 hours.  BNP (last 3 results)  Recent Labs  04/07/15 1635  BNP 117.8*    ProBNP (last 3 results) No results for input(s): PROBNP in the last 8760 hours.  CBG: No results for input(s): GLUCAP in the last 168 hours.  Radiological Exams on Admission: Ct Chest W Contrast  04/07/2015  CLINICAL DATA:  Left side chest pain, shortness of breath for 4 days EXAM: CT CHEST WITH CONTRAST TECHNIQUE: Multidetector CT imaging of the chest was performed during intravenous contrast administration. CONTRAST:  46mL OMNIPAQUE IOHEXOL 300 MG/ML  SOLN COMPARISON:  Chest x-ray same day FINDINGS: Sagittal images of the spine shows diffuse osteopenia. Mild degenerative changes thoracic spine. Atherosclerotic calcifications of thoracic aorta are noted. There is no evidence of aortic dissection. Central pulmonary artery is unremarkable. No central pulmonary embolus is noted. There is moderate to large left pleural  effusion. Significant compressive atelectasis noted in left lower lobe. There is mild loculated anterior pleural effusion in lingula. Mild atelectasis or infiltrate in lingula. Calcified mediastinal and left hilar lymph nodes are noted probable due to prior granulomatous disease. There is small left pleural effusion and minimal left base posterior atelectasis. Central airways are patent. Trace anterior pericardial effusion. There is no pulmonary edema. The visualized upper abdomen  is unremarkable. IMPRESSION: 1. There is moderate to large left pleural effusion. Significant compressive atelectasis noted in left lower lobe. Small amount of fluid noted along the left fissure. Mild loculated pleural effusion in lingula. Small atelectasis or infiltrate in lingula. 2. No aortic aneurysm or dissection. Atherosclerotic calcifications of thoracic aorta. No central pulmonary embolus. 3. There is small right pleural effusion with right base posterior atelectasis. No pulmonary edema. 4. Calcified mediastinal and left hilar lymph node probable due to prior granulomatous disease. 5. Osteopenia and degenerative changes thoracic spine. Electronically Signed   By: Lahoma Crocker M.D.   On: 04/07/2015 20:05   Dg Chest Port 1 View  04/07/2015  CLINICAL DATA:  Pt here with c/o LEFT CP, does not radiate to arm. Pain has subsided some since earlier. Denies hx heart/lung issues. Hx HTN - on meds EXAM: PORTABLE CHEST - 1 VIEW COMPARISON:  03/20/2014 FINDINGS: Moderate left and smaller right pleural effusions, new since prior exam. Consolidation/atelectasis at the left lung base. Interstitial edema/infiltrates and probable central pulmonary vascular congestion, new since prior study. Heart size of prev to assess due to adjacent opacities. Atheromatous aorta. No pneumothorax. Left humeral surgical neck fracture as before. IMPRESSION: 1. New bilateral edema/infiltrates and asymmetric effusions left greater than right. Electronically Signed    By: Lucrezia Europe M.D.   On: 04/07/2015 17:16     EKG: Independently reviewed.     Assessment/Plan:      Pleural Effusions- Bilateral, and a chronic effusion which is Loculated   IV Lasix    Family would like to know options for Rx of Effusions defer to primary team    Diastolic Dysfunction- Probable Acute on Chronic CHF   IV Lasix per CHF Protocol   2 D ECHO in AM    Chest Pain   Cardiac Monitoring   Cycle Troponins    UTI-   Urine C+S sent   IV Rocephin    Essential HTN   Continue Labetalol RX   PRN IV Hydralazine for SBP > 170    Hypothyroid   Continue Levothyroxine Rx    Alzheimer's Dementia   On Memantidine    DVT Prophylaxis   SCDs      Code Status:      DO NOT RESUSCITATE (DNR)      Family Communication:   Daughter and Family at Bedside     Disposition Plan:    Inpatient Status        Time spent:  [place] [facility] Pager [phone]   If [place] Please Contact the Day Rounding Team MD for [facility]  If 7PM-7AM, Please Contact Night-Floor Coverage  [email or web address] Password [email or web address] 04/07/2015, 9:59 PM     ADDENDUM:   Patient was seen and examined on 04/07/2015

## 2015-04-07 NOTE — ED Provider Notes (Signed)
CSN: [id number]     Arrival date & time 04/07/15  1619 History   First MD Initiated Contact with Patient 04/07/15 1624     Chief Complaint  Patient presents with  . Chest Pain     (Consider location/radiation/quality/duration/timing/severity/associated sxs/prior Treatment) Patient is a 81 y.o. Petersen presenting with chest pain. The history is provided by the patient (Patient has been having chest pain. And mild shortness of breath).  Chest Pain Pain location:  L chest Pain quality: aching   Pain radiates to:  Does not radiate Pain radiates to the back: yes   Pain severity:  Moderate Onset quality:  Sudden Timing:  Intermittent Progression:  Waxing and waning Associated symptoms: no abdominal pain, no back pain, no cough, no fatigue and no headache     Past Medical History  Diagnosis Date  . Hypertension   . Nonspecific abnormal electrocardiogram (ECG) (EKG)   . Stricture and stenosis of esophagus   . Esophageal reflux   . Diverticulosis of colon (without mention of hemorrhage)   . Benign neoplasm of colon   . Macular degeneration   . Osteoarthritis     right hip  . Other and unspecified hyperlipidemia   . HTN (hypertension)   . Shingles   . Cystocele   . Gastroparesis   . Skin cancer (melanoma) ([place])   . Hypothyroidism   . Cancer ([place])     pre-melanoma  . Hemorrhoids   . Primary osteoarthritis of right hip 03/27/2014  . Acute blood loss anemia 03/29/2014    03/30/14 Hgb 7.3 04/04/14 Hgb 8.5 continue Fe bid.  05/04/14 Hgb 10.8   . Dementia of the Alzheimer's type 05/22/2014    05/19/14 MMSE 18/30 Namenda.    . Diastolic dysfunction [id number]  . Diverticulosis of large intestine 06/28/2004  . Essential hypertension 07/05/2013  . Hyperlipidemia 07/05/2013  . Internal hemorrhoids without mention of complication [id number]  . Prediabetes 11/17/2013  . Stricture and stenosis of esophagus 10/27/2007  . Vitamin D deficiency 11/17/2013   Past Surgical History  Procedure  Laterality Date  . Cholecystectomy    . Gallbladder surgery  2002  . Hemorrhoid surgery    . Appendectomy    . Thyroidectomy    . Left eye surgery    . Cataract extraction, bilateral    . Melenoma resection      right arm  . Bcc resection      legs/face  . Tonsillectomy    . Flexible sigmoidoscopy  05/12/2011    Procedure: FLEXIBLE SIGMOIDOSCOPY;  Surgeon: Inda Castle, MD;  Location: [facility];  Service: Endoscopy;  Laterality: N/A;  . Flexible sigmoidoscopy  09/10/2011    Procedure: FLEXIBLE SIGMOIDOSCOPY;  Surgeon: Inda Castle, MD;  Location: [facility];  Service: Endoscopy;  Laterality: N/A;  . Fracture surgery      left hip pin  . Flexible sigmoidoscopy N/A 08/16/2012    Procedure: FLEXIBLE SIGMOIDOSCOPY;  Surgeon: Inda Castle, MD;  Location: [facility];  Service: Endoscopy;  Laterality: N/A;  . Hemorrhoid banding N/A 08/16/2012    Procedure: HEMORRHOID BANDING;  Surgeon: Inda Castle, MD;  Location: [facility];  Service: Endoscopy;  Laterality: N/A;  . Total hip arthroplasty Right 03/27/2014    Procedure: TOTAL HIP ARTHROPLASTY ANTERIOR APPROACH;  Surgeon: Alta Corning, MD;  Location: [facility];  Service: Orthopedics;  Laterality: Right;   Family History  Problem Relation Age of Onset  . Esophageal cancer Brother   . Heart disease Father   .  Hypertension Father   . Heart attack Father   . Colon cancer Neg Hx   . Malignant hyperthermia Neg Hx   . Diabetes Daughter    Social History  Substance Use Topics  . Smoking status: Never Smoker   . Smokeless tobacco: Never Used  . Alcohol Use: No   OB History    No data available     Review of Systems  Constitutional: Negative for appetite change and fatigue.  HENT: Negative for congestion, ear discharge and sinus pressure.   Eyes: Negative for discharge.  Respiratory: Negative for cough.   Cardiovascular: Positive for chest pain.  Gastrointestinal: Negative for abdominal pain and diarrhea.   Genitourinary: Negative for frequency and hematuria.  Musculoskeletal: Negative for back pain.  Skin: Negative for rash.  Neurological: Negative for seizures and headaches.  Psychiatric/Behavioral: Negative for hallucinations.      Allergies  Ace inhibitors; Statins; Vicodin; Fosamax; Metoclopramide hcl; Sulfa antibiotics; Sulfonamide derivatives; and Vesicare  Home Medications   Prior to Admission medications   Medication Sig Start Date End Date Taking? Authorizing Provider  aspirin EC Crystal MG tablet Take Crystal mg by mouth daily.   Yes Historical Provider, MD  calcium carbonate (CAL-GEST ANTACID) 500 MG chewable tablet Chew 1 tablet by mouth daily as needed for indigestion or heartburn.   Yes Historical Provider, MD  Cholecalciferol (VITAMIN D) 2000 UNITS tablet Take 4,000 Units by mouth daily.   Yes Historical Provider, MD  docusate sodium (COLACE) 100 MG capsule Take 100 mg by mouth 2 (two) times daily as needed for mild constipation.    Yes Historical Provider, MD  famotidine (PEPCID) 20 MG tablet Take 20 mg by mouth at bedtime.   Yes Historical Provider, MD  guaiFENesin (ROBITUSSIN) 100 MG/5ML SOLN Take 10 mLs by mouth every 6 (six) hours as needed for cough or to loosen phlegm.   Yes Historical Provider, MD  labetalol (NORMODYNE) 100 MG tablet TAKE 1 TABLET BY MOUTH TWICE A DAY 05/29/14  Yes Unk Pinto, MD  levothyroxine (SYNTHROID, LEVOTHROID) 100 MCG tablet Take 100 mcg by mouth daily before breakfast.   Yes Historical Provider, MD  memantine (NAMENDA XR) 28 MG CP24 24 hr capsule Take 28 mg by mouth daily.   Yes Historical Provider, MD  Multiple Vitamins-Minerals (PRESERVISION AREDS 2) CAPS Take 1 capsule by mouth 2 (two) times daily.   Yes Historical Provider, MD  potassium chloride SA (K-DUR,KLOR-CON) 20 MEQ tablet Take 20 mEq by mouth daily.   Yes Historical Provider, MD  sennosides-docusate sodium (SENOKOT-S) 8.6-50 MG tablet Take 2 tablets by mouth at bedtime. One at bedtime    Yes Historical Provider, MD   BP 145/Crystal mmHg  Pulse Crystal  Temp(Src) 98 F (36.7 C) (Oral)  Resp 22  Ht 5\' 5"  (1.651 m)  SpO2 93% Physical Exam  Constitutional: She appears well-developed.  HENT:  Head: Normocephalic.  Eyes: Conjunctivae and EOM are normal. No scleral icterus.  Neck: Neck supple. No thyromegaly present.  Cardiovascular: Normal rate and regular rhythm.  Exam reveals no gallop and no friction rub.   No murmur heard. Pulmonary/Chest: No stridor. She has no wheezes. She has no rales. She exhibits no tenderness.  Abdominal: She exhibits no distension. There is no tenderness. There is no rebound.  Musculoskeletal: Normal range of motion. She exhibits no edema.  Lymphadenopathy:    She has no cervical adenopathy.  Neurological: She is alert. She exhibits normal muscle tone. Coordination normal.  Skin: No rash noted. No erythema.  Psychiatric: She has a normal mood and affect. Her behavior is normal.    ED Course  Procedures (including critical care time) Labs Review Labs Reviewed  COMPREHENSIVE METABOLIC PANEL - Abnormal; Notable for the following:    Glucose, Bld 102 (*)    Total Protein 6.2 (*)    Albumin 3.0 (*)    ALT 9 (*)    All other components within normal limits  BRAIN NATRIURETIC PEPTIDE - Abnormal; Notable for the following:    B Natriuretic Peptide 117.8 (*)    All other components within normal limits  URINALYSIS, ROUTINE W REFLEX MICROSCOPIC (NOT AT [facility]) - Abnormal; Notable for the following:    APPearance CLOUDY (*)    Hgb urine dipstick TRACE (*)    Nitrite POSITIVE (*)    Leukocytes, UA SMALL (*)    All other components within normal limits  URINE MICROSCOPIC-ADD ON - Abnormal; Notable for the following:    Squamous Epithelial / LPF 0-5 (*)    Bacteria, UA MANY (*)    All other components within normal limits  URINE CULTURE  CBC WITH DIFFERENTIAL/PLATELET  I-STAT TROPOININ, ED  I-STAT TROPOININ, ED    Imaging Review Ct Chest W  Contrast  04/07/2015  CLINICAL DATA:  Left side chest pain, shortness of breath for 4 days EXAM: CT CHEST WITH CONTRAST TECHNIQUE: Multidetector CT imaging of the chest was performed during intravenous contrast administration. CONTRAST:  49mL OMNIPAQUE IOHEXOL 300 MG/ML  SOLN COMPARISON:  Chest x-ray same day FINDINGS: Sagittal images of the spine shows diffuse osteopenia. Mild degenerative changes thoracic spine. Atherosclerotic calcifications of thoracic aorta are noted. There is no evidence of aortic dissection. Central pulmonary artery is unremarkable. No central pulmonary embolus is noted. There is moderate to large left pleural effusion. Significant compressive atelectasis noted in left lower lobe. There is mild loculated anterior pleural effusion in lingula. Mild atelectasis or infiltrate in lingula. Calcified mediastinal and left hilar lymph nodes are noted probable due to prior granulomatous disease. There is small left pleural effusion and minimal left base posterior atelectasis. Central airways are patent. Trace anterior pericardial effusion. There is no pulmonary edema. The visualized upper abdomen is unremarkable. IMPRESSION: 1. There is moderate to large left pleural effusion. Significant compressive atelectasis noted in left lower lobe. Small amount of fluid noted along the left fissure. Mild loculated pleural effusion in lingula. Small atelectasis or infiltrate in lingula. 2. No aortic aneurysm or dissection. Atherosclerotic calcifications of thoracic aorta. No central pulmonary embolus. 3. There is small right pleural effusion with right base posterior atelectasis. No pulmonary edema. 4. Calcified mediastinal and left hilar lymph node probable due to prior granulomatous disease. 5. Osteopenia and degenerative changes thoracic spine. Electronically Signed   By: Lahoma Crocker M.D.   On: 04/07/2015 20:05   Dg Chest Port 1 View  04/07/2015  CLINICAL DATA:  Pt here with c/o LEFT CP, does not radiate  to arm. Pain has subsided some since earlier. Denies hx heart/lung issues. Hx HTN - on meds EXAM: PORTABLE CHEST - 1 VIEW COMPARISON:  03/20/2014 FINDINGS: Moderate left and smaller right pleural effusions, new since prior exam. Consolidation/atelectasis at the left lung base. Interstitial edema/infiltrates and probable central pulmonary vascular congestion, new since prior study. Heart size of prev to assess due to adjacent opacities. Atheromatous aorta. No pneumothorax. Left humeral surgical neck fracture as before. IMPRESSION: 1. New bilateral edema/infiltrates and asymmetric effusions left greater than right. Electronically Signed   By: Eden Emms.D.  On: 04/07/2015 17:16   I have personally reviewed and evaluated these images and lab results as part of my medical decision-making.   EKG Interpretation None      MDM   Final diagnoses:  Chest pain at rest    Patient pleural effusion and urinary tract infection she will be admitted for the chest pain, will have antibiotics for her UTI    Milton Ferguson, MD 04/07/15 2117

## 2015-04-07 NOTE — ED Notes (Signed)
Pt is from assisted living, has had CP (side shoulder pain). Pt takes tylenol for pain. Pt's son unaware that this pain had been going on- called EMS. Denies N/V/SOB. NSR HR 70, BP 180/90. Pt from [place]- pt is at mental baseline, dementia but is alert

## 2015-04-08 ENCOUNTER — Inpatient Hospital Stay (HOSPITAL_COMMUNITY): Payer: Medicare Other

## 2015-04-08 DIAGNOSIS — I509 Heart failure, unspecified: Secondary | ICD-10-CM

## 2015-04-08 LAB — CBC
HCT: 41.1 % (ref 36.0–46.0)
HEMOGLOBIN: 13.1 g/dL (ref 12.0–15.0)
MCH: 29 pg (ref 26.0–34.0)
MCHC: 31.9 g/dL (ref 30.0–36.0)
MCV: 90.9 fL (ref 78.0–100.0)
Platelets: 436 10*3/uL — ABNORMAL HIGH (ref 150–400)
RBC: 4.52 MIL/uL (ref 3.87–5.11)
RDW: 13.9 % (ref 11.5–15.5)
WBC: 13.7 10*3/uL — ABNORMAL HIGH (ref 4.0–10.5)

## 2015-04-08 LAB — BASIC METABOLIC PANEL
Anion gap: 13 (ref 5–15)
BUN: 12 mg/dL (ref 6–20)
CALCIUM: 8.9 mg/dL (ref 8.9–10.3)
CHLORIDE: 98 mmol/L — AB (ref 101–111)
CO2: 27 mmol/L (ref 22–32)
CREATININE: 0.76 mg/dL (ref 0.44–1.00)
GFR calc Af Amer: >60 mL/min (ref >60)
GFR calc non Af Amer: >60 mL/min (ref >60)
Glucose, Bld: 146 mg/dL — ABNORMAL HIGH (ref 65–99)
Potassium: 3.8 mmol/L (ref 3.5–5.1)
SODIUM: 138 mmol/L (ref 135–145)

## 2015-04-08 LAB — TROPONIN I
Troponin I: <0.03 ng/mL (ref <0.031)
Troponin I: <0.03 ng/mL (ref <0.031)
Troponin I: <0.03 ng/mL (ref <0.031)

## 2015-04-08 LAB — TSH: TSH: 3.413 u[IU]/mL (ref 0.350–4.500)

## 2015-04-08 MED ORDER — SODIUM CHLORIDE 0.9 % IJ SOLN
3.0000 mL | Freq: Two times a day (BID) | INTRAMUSCULAR | Status: DC
  Administered 2015-04-09: 3 mL via INTRAVENOUS

## 2015-04-08 MED ORDER — POTASSIUM CHLORIDE CRYS ER 20 MEQ PO TBCR: 20.0000 meq | EXTENDED_RELEASE_TABLET | Freq: Every day | ORAL | Status: DC

## 2015-04-08 MED ORDER — SENNOSIDES-DOCUSATE SODIUM 8.6-50 MG PO TABS
2.0000 | ORAL_TABLET | Freq: Every day | ORAL | Status: DC
  Administered 2015-04-08 (×2): 2 via ORAL
  Filled 2015-04-08 (×2): qty 2

## 2015-04-08 MED ORDER — ONDANSETRON HCL 4 MG/2ML IJ SOLN: 4.0000 mg | Freq: Four times a day (QID) | INTRAMUSCULAR | Status: DC | PRN

## 2015-04-08 MED ORDER — ASPIRIN EC 325 MG PO TBEC
325.0000 mg | DELAYED_RELEASE_TABLET | Freq: Every day | ORAL | Status: DC
  Administered 2015-04-08 – 2015-04-09 (×2): 325 mg via ORAL
  Filled 2015-04-08 (×2): qty 1

## 2015-04-08 MED ORDER — ALUM & MAG HYDROXIDE-SIMETH 200-200-20 MG/5ML PO SUSP: 30.0000 mL | Freq: Four times a day (QID) | ORAL | Status: DC | PRN

## 2015-04-08 MED ORDER — LABETALOL HCL 100 MG PO TABS
100.0000 mg | ORAL_TABLET | Freq: Two times a day (BID) | ORAL | Status: DC
  Administered 2015-04-08 (×2): 100 mg via ORAL
  Filled 2015-04-08 (×2): qty 1

## 2015-04-08 MED ORDER — CALCIUM CARBONATE ANTACID 500 MG PO CHEW: 1.0000 | CHEWABLE_TABLET | Freq: Every day | ORAL | Status: DC | PRN

## 2015-04-08 MED ORDER — ENOXAPARIN SODIUM 40 MG/0.4ML ~~LOC~~ SOLN
40.0000 mg | Freq: Once | SUBCUTANEOUS | Status: AC
  Administered 2015-04-08: 40 mg via SUBCUTANEOUS
  Filled 2015-04-08: qty 0.4

## 2015-04-08 MED ORDER — DOCUSATE SODIUM 100 MG PO CAPS: 100.0000 mg | ORAL_CAPSULE | Freq: Two times a day (BID) | ORAL | Status: DC | PRN

## 2015-04-08 MED ORDER — HYDROMORPHONE HCL 1 MG/ML IJ SOLN
0.5000 mg | INTRAMUSCULAR | Status: DC | PRN
  Filled 2015-04-08: qty 1

## 2015-04-08 MED ORDER — HYDRALAZINE HCL 20 MG/ML IJ SOLN: 5.0000 mg | Freq: Four times a day (QID) | INTRAMUSCULAR | Status: DC | PRN

## 2015-04-08 MED ORDER — ACETAMINOPHEN 650 MG RE SUPP: 650.0000 mg | Freq: Four times a day (QID) | RECTAL | Status: DC | PRN

## 2015-04-08 MED ORDER — LEVOTHYROXINE SODIUM 100 MCG PO TABS
100.0000 ug | ORAL_TABLET | Freq: Every day | ORAL | Status: DC
  Administered 2015-04-08 – 2015-04-09 (×2): 100 ug via ORAL
  Filled 2015-04-08 (×2): qty 1

## 2015-04-08 MED ORDER — MEMANTINE HCL ER 28 MG PO CP24
28.0000 mg | ORAL_CAPSULE | Freq: Every day | ORAL | Status: DC
  Administered 2015-04-08 – 2015-04-09 (×2): 28 mg via ORAL
  Filled 2015-04-08 (×2): qty 1

## 2015-04-08 MED ORDER — ONDANSETRON HCL 4 MG PO TABS: 4.0000 mg | ORAL_TABLET | Freq: Four times a day (QID) | ORAL | Status: DC | PRN

## 2015-04-08 MED ORDER — FAMOTIDINE 20 MG PO TABS
20.0000 mg | ORAL_TABLET | Freq: Every day | ORAL | Status: DC
  Administered 2015-04-08 (×2): 20 mg via ORAL
  Filled 2015-04-08 (×2): qty 1

## 2015-04-08 MED ORDER — ACETAMINOPHEN 325 MG PO TABS
650.0000 mg | ORAL_TABLET | Freq: Four times a day (QID) | ORAL | Status: DC | PRN
  Administered 2015-04-09: 650 mg via ORAL
  Filled 2015-04-08 (×2): qty 2

## 2015-04-08 MED ORDER — VITAMIN D 1000 UNITS PO TABS
4000.0000 [IU] | ORAL_TABLET | Freq: Every day | ORAL | Status: DC
  Administered 2015-04-08 – 2015-04-09 (×2): 4000 [IU] via ORAL
  Filled 2015-04-08 (×2): qty 4

## 2015-04-08 MED ORDER — LABETALOL HCL 200 MG PO TABS
200.0000 mg | ORAL_TABLET | Freq: Two times a day (BID) | ORAL | Status: DC
  Administered 2015-04-08 – 2015-04-09 (×2): 200 mg via ORAL
  Filled 2015-04-08 (×2): qty 1

## 2015-04-08 MED ORDER — CEPHALEXIN 500 MG PO CAPS
500.0000 mg | ORAL_CAPSULE | Freq: Two times a day (BID) | ORAL | Status: DC
  Administered 2015-04-08 – 2015-04-09 (×2): 500 mg via ORAL
  Filled 2015-04-08 (×2): qty 1

## 2015-04-08 MED ORDER — PROSIGHT PO TABS
1.0000 | ORAL_TABLET | Freq: Two times a day (BID) | ORAL | Status: DC
  Administered 2015-04-08 – 2015-04-09 (×3): 1 via ORAL
  Filled 2015-04-08 (×4): qty 1

## 2015-04-08 MED ORDER — SODIUM CHLORIDE 0.9 % IV SOLN
INTRAVENOUS | Status: DC
  Administered 2015-04-08 (×2): via INTRAVENOUS

## 2015-04-08 NOTE — Progress Notes (Signed)
Echocardiogram 2D Echocardiogram has been performed.  Crystal Petersen 04/08/2015, 9:33 AM

## 2015-04-08 NOTE — Progress Notes (Signed)
Patient admitted and oriented to room. Patient complaining of chest pain from pleural effusion, elevated bed. Son and daughter accompanied from the ED. Call light within reach.

## 2015-04-08 NOTE — Progress Notes (Addendum)
[facility] PROGRESS NOTE  Crystal Petersen [id number] DOB: 1921/12/08 DOA: 04/07/2015 PCP: Alesia Richards, MD  Length of stay: 1   Assessment/Plan: Active Problems:   Hypothyroidism   Essential hypertension   Dementia of the Alzheimer's type   Chest pain   Pleural effusion, left   Diastolic CHF, acute on chronic (HCC)   UTI (urinary tract infection)   HPI: Crystal Petersen is a 81 y.o. female with a history of Diastolic dysfunction, HTN, Hypothyroid and Alzheimer's Dementia who was sent from the [facility] to the ED due to complaints of chest pain this afternoon, and ABD and low back pain this AM. She was evalauted in the ED and was found to have a UTI, and was placed on Keflex. A Chest X-ray revealed a Bilateral Pleuarl Effusions, with the left greater than right. The initial cardiac workup has been negative.   Assessment and plan UTI-continue IV Rocephin, now switched to Keflex, follow urine culture results   Chest pain likely musculoskeletal, cardiac enzymes negative, CT scan shows moderate to large left-sided pleural effusion that would need further evaluation with a thoracentesis No pneumonia No PE 2-D echo within normal limitsnormal wall motion abnormalities, EF 0000000, grade 1 diastolic dysfunction  Hypertension-uncontrolled, increase labetalol, etc.  Hypothyroidism, continue Synthroid and check TSH   DVT prophylaxsis Lovenox  Code Status:      Code Status Orders        Start     Ordered   04/08/15 0056  Do not attempt resuscitation (DNR)   Continuous    Question Answer Comment  In the event of cardiac or respiratory ARREST Do not call a "code blue"   In the event of cardiac or respiratory ARREST Do not perform Intubation, CPR, defibrillation or ACLS   In the event of cardiac or respiratory ARREST Use medication by any route, position, wound care, and other measures to relive pain and suffering. May use oxygen, suction and  manual treatment of airway obstruction as needed for comfort.      04/08/15 0055    Advance Directive Documentation        Most Recent Value   Type of Advance Directive  Out of facility DNR (pink MOST or yellow form)   Pre-existing out of facility DNR order (yellow form or pink MOST form)     "MOST" Form in Place?        Family Communication: Discussed in detail with the patient, all imaging results, lab results explained to the patient . Discussed with the patient's daughter and updated her on the phone  Disposition Plan:  As above     Consultants:  None  Procedures:  None  Antibiotics: Anti-infectives    Start     Dose/Rate Route Frequency Ordered Stop   04/07/15 1945  cephALEXin (KEFLEX) capsule 500 mg     500 mg Oral  Once 04/07/15 1940 04/07/15 2007         HPI/Subjective: Chest pain-free, no shortness of breath, hypertensive,  Objective: Filed Vitals:   04/07/15 2145 04/07/15 2201 04/08/15 0500 04/08/15 1101  BP: 192/101  137/69 155/76  Pulse: 80  82 84  Temp:  98.8 F (37.1 C) 97.5 F (36.4 C) 97.8 F (36.6 C)  TempSrc:  Oral Oral Oral  Resp: 20  18   Height:      Weight:   61.236 kg (135 lb)   SpO2: 94%  96% 94%    Intake/Output Summary (Last  24 hours) at 04/08/15 1142 Last data filed at 04/08/15 0900  Gross per 24 hour  Intake      0 ml  Output    400 ml  Net   -400 ml    Exam:  General: No acute respiratory distress Lungs: Clear to auscultation bilaterally without wheezes or crackles Cardiovascular: Regular rate and rhythm without murmur gallop or rub normal S1 and S2 Abdomen: Nontender, nondistended, soft, bowel sounds positive, no rebound, no ascites, no appreciable mass Extremities: No significant cyanosis, clubbing, or edema bilateral lower extremities     Data Review   Micro Results No results found for this or any previous visit (from the past 240 hour(s)).  Radiology Reports Ct Chest W Contrast  04/07/2015  CLINICAL  DATA:  Left side chest pain, shortness of breath for 4 days EXAM: CT CHEST WITH CONTRAST TECHNIQUE: Multidetector CT imaging of the chest was performed during intravenous contrast administration. CONTRAST:  64mL OMNIPAQUE IOHEXOL 300 MG/ML  SOLN COMPARISON:  Chest x-ray same day FINDINGS: Sagittal images of the spine shows diffuse osteopenia. Mild degenerative changes thoracic spine. Atherosclerotic calcifications of thoracic aorta are noted. There is no evidence of aortic dissection. Central pulmonary artery is unremarkable. No central pulmonary embolus is noted. There is moderate to large left pleural effusion. Significant compressive atelectasis noted in left lower lobe. There is mild loculated anterior pleural effusion in lingula. Mild atelectasis or infiltrate in lingula. Calcified mediastinal and left hilar lymph nodes are noted probable due to prior granulomatous disease. There is small left pleural effusion and minimal left base posterior atelectasis. Central airways are patent. Trace anterior pericardial effusion. There is no pulmonary edema. The visualized upper abdomen is unremarkable. IMPRESSION: 1. There is moderate to large left pleural effusion. Significant compressive atelectasis noted in left lower lobe. Small amount of fluid noted along the left fissure. Mild loculated pleural effusion in lingula. Small atelectasis or infiltrate in lingula. 2. No aortic aneurysm or dissection. Atherosclerotic calcifications of thoracic aorta. No central pulmonary embolus. 3. There is small right pleural effusion with right base posterior atelectasis. No pulmonary edema. 4. Calcified mediastinal and left hilar lymph node probable due to prior granulomatous disease. 5. Osteopenia and degenerative changes thoracic spine. Electronically Signed   By: Lahoma Crocker M.D.   On: 04/07/2015 20:05   Dg Chest Port 1 View  04/07/2015  CLINICAL DATA:  Pt here with c/o LEFT CP, does not radiate to arm. Pain has subsided some  since earlier. Denies hx heart/lung issues. Hx HTN - on meds EXAM: PORTABLE CHEST - 1 VIEW COMPARISON:  03/20/2014 FINDINGS: Moderate left and smaller right pleural effusions, new since prior exam. Consolidation/atelectasis at the left lung base. Interstitial edema/infiltrates and probable central pulmonary vascular congestion, new since prior study. Heart size of prev to assess due to adjacent opacities. Atheromatous aorta. No pneumothorax. Left humeral surgical neck fracture as before. IMPRESSION: 1. New bilateral edema/infiltrates and asymmetric effusions left greater than right. Electronically Signed   By: Lucrezia Europe M.D.   On: 04/07/2015 17:16     CBC  Recent Labs Lab 04/07/15 1635 04/08/15 0733  WBC 8.5 13.7*  HGB 12.0 13.1  HCT 38.1 41.1  PLT 378 436*  MCV 91.6 90.9  MCH 28.8 29.0  MCHC 31.5 31.9  RDW 14.0 13.9  LYMPHSABS 1.4  --   MONOABS 0.8  --   EOSABS 0.6  --   BASOSABS 0.0  --     Chemistries   Recent Labs Lab  04/07/15 1635 04/08/15 0733  NA 139 138  K 4.2 3.8  CL 104 98*  CO2 25 27  GLUCOSE 102* 146*  BUN 17 12  CREATININE 0.75 0.76  CALCIUM 8.9 8.9  AST 16  --   ALT 9*  --   ALKPHOS 81  --   BILITOT 0.7  --    ------------------------------------------------------------------------------------------------------------------ estimated creatinine clearance is 41.2 mL/min (by C-G formula based on Cr of 0.76). ------------------------------------------------------------------------------------------------------------------ No results for input(s): HGBA1C in the last 72 hours. ------------------------------------------------------------------------------------------------------------------ No results for input(s): CHOL, HDL, LDLCALC, TRIG, CHOLHDL, LDLDIRECT in the last 72 hours. ------------------------------------------------------------------------------------------------------------------ No results for input(s): TSH, T4TOTAL, T3FREE, THYROIDAB in the  last 72 hours.  Invalid input(s): FREET3 ------------------------------------------------------------------------------------------------------------------ No results for input(s): VITAMINB12, FOLATE, FERRITIN, TIBC, IRON, RETICCTPCT in the last 72 hours.  Coagulation profile No results for input(s): INR, PROTIME in the last 168 hours.  No results for input(s): DDIMER in the last 72 hours.  Cardiac Enzymes  Recent Labs Lab 04/08/15 0125 04/08/15 0733  TROPONINI <0.03 <0.03   ------------------------------------------------------------------------------------------------------------------ Invalid input(s): POCBNP   CBG: No results for input(s): GLUCAP in the last 168 hours.     Studies: Ct Chest W Contrast  04/07/2015  CLINICAL DATA:  Left side chest pain, shortness of breath for 4 days EXAM: CT CHEST WITH CONTRAST TECHNIQUE: Multidetector CT imaging of the chest was performed during intravenous contrast administration. CONTRAST:  17mL OMNIPAQUE IOHEXOL 300 MG/ML  SOLN COMPARISON:  Chest x-ray same day FINDINGS: Sagittal images of the spine shows diffuse osteopenia. Mild degenerative changes thoracic spine. Atherosclerotic calcifications of thoracic aorta are noted. There is no evidence of aortic dissection. Central pulmonary artery is unremarkable. No central pulmonary embolus is noted. There is moderate to large left pleural effusion. Significant compressive atelectasis noted in left lower lobe. There is mild loculated anterior pleural effusion in lingula. Mild atelectasis or infiltrate in lingula. Calcified mediastinal and left hilar lymph nodes are noted probable due to prior granulomatous disease. There is small left pleural effusion and minimal left base posterior atelectasis. Central airways are patent. Trace anterior pericardial effusion. There is no pulmonary edema. The visualized upper abdomen is unremarkable. IMPRESSION: 1. There is moderate to large left pleural effusion.  Significant compressive atelectasis noted in left lower lobe. Small amount of fluid noted along the left fissure. Mild loculated pleural effusion in lingula. Small atelectasis or infiltrate in lingula. 2. No aortic aneurysm or dissection. Atherosclerotic calcifications of thoracic aorta. No central pulmonary embolus. 3. There is small right pleural effusion with right base posterior atelectasis. No pulmonary edema. 4. Calcified mediastinal and left hilar lymph node probable due to prior granulomatous disease. 5. Osteopenia and degenerative changes thoracic spine. Electronically Signed   By: Lahoma Crocker M.D.   On: 04/07/2015 20:05   Dg Chest Port 1 View  04/07/2015  CLINICAL DATA:  Pt here with c/o LEFT CP, does not radiate to arm. Pain has subsided some since earlier. Denies hx heart/lung issues. Hx HTN - on meds EXAM: PORTABLE CHEST - 1 VIEW COMPARISON:  03/20/2014 FINDINGS: Moderate left and smaller right pleural effusions, new since prior exam. Consolidation/atelectasis at the left lung base. Interstitial edema/infiltrates and probable central pulmonary vascular congestion, new since prior study. Heart size of prev to assess due to adjacent opacities. Atheromatous aorta. No pneumothorax. Left humeral surgical neck fracture as before. IMPRESSION: 1. New bilateral edema/infiltrates and asymmetric effusions left greater than right. Electronically Signed   By: Lucrezia Europe M.D.   On:  04/07/2015 17:16      Lab Results  Component Value Date   HGBA1C 5.8* 07/06/2014   Lab Results  Component Value Date   LDLCALC 111* 07/06/2014   CREATININE 0.76 04/08/2015       Scheduled Meds: . aspirin EC  325 mg Oral Daily  . cholecalciferol  4,000 Units Oral Daily  . famotidine  20 mg Oral QHS  . labetalol  100 mg Oral BID  . levothyroxine  100 mcg Oral QAC breakfast  . memantine  28 mg Oral Daily  . multivitamin  1 tablet Oral BID  . potassium chloride  10 mEq Oral Daily  . senna-docusate  2 tablet Oral  QHS  . sodium chloride  3 mL Intravenous Q12H  . sodium chloride  3 mL Intravenous Q12H   Continuous Infusions: . sodium chloride 50 mL/hr at 04/08/15 0136    Active Problems:   Hypothyroidism   Essential hypertension   Dementia of the Alzheimer's type   Chest pain   Pleural effusion, left   Diastolic CHF, acute on chronic (HCC)   UTI (urinary tract infection)    Time spent: 45 minutes   [facility] Pager [phone]. If 7PM-7AM, please contact night-coverage at [email or web address], password [facility] 04/08/2015, 11:42 AM  LOS: 1 day

## 2015-04-09 ENCOUNTER — Inpatient Hospital Stay (HOSPITAL_COMMUNITY): Payer: Medicare Other

## 2015-04-09 LAB — GRAM STAIN

## 2015-04-09 LAB — COMPREHENSIVE METABOLIC PANEL
ALT: 8 U/L — ABNORMAL LOW (ref 14–54)
ANION GAP: 11 (ref 5–15)
AST: 13 U/L — ABNORMAL LOW (ref 15–41)
Albumin: 2.8 g/dL — ABNORMAL LOW (ref 3.5–5.0)
Alkaline Phosphatase: 69 U/L (ref 38–126)
BUN: 19 mg/dL (ref 6–20)
CALCIUM: 8.5 mg/dL — AB (ref 8.9–10.3)
CHLORIDE: 102 mmol/L (ref 101–111)
CO2: 25 mmol/L (ref 22–32)
CREATININE: 0.74 mg/dL (ref 0.44–1.00)
Glucose, Bld: 91 mg/dL (ref 65–99)
Potassium: 3.2 mmol/L — ABNORMAL LOW (ref 3.5–5.1)
SODIUM: 138 mmol/L (ref 135–145)
Total Bilirubin: 0.5 mg/dL (ref 0.3–1.2)
Total Protein: 5.3 g/dL — ABNORMAL LOW (ref 6.5–8.1)

## 2015-04-09 LAB — CBC
HCT: 35.2 % — ABNORMAL LOW (ref 36.0–46.0)
Hemoglobin: 11.1 g/dL — ABNORMAL LOW (ref 12.0–15.0)
MCH: 28.5 pg (ref 26.0–34.0)
MCHC: 31.5 g/dL (ref 30.0–36.0)
MCV: 90.3 fL (ref 78.0–100.0)
PLATELETS: 371 10*3/uL (ref 150–400)
RBC: 3.9 MIL/uL (ref 3.87–5.11)
RDW: 14.2 % (ref 11.5–15.5)
WBC: 9.7 10*3/uL (ref 4.0–10.5)

## 2015-04-09 LAB — LACTATE DEHYDROGENASE: LDH: 174 U/L (ref 98–192)

## 2015-04-09 LAB — BODY FLUID CELL COUNT WITH DIFFERENTIAL
EOS FL: 3 %
LYMPHS FL: 41 %
Monocyte-Macrophage-Serous Fluid: 34 % — ABNORMAL LOW (ref 50–90)
NEUTROPHIL FLUID: 22 % (ref 0–25)
Total Nucleated Cell Count, Fluid: 1260 cu mm — ABNORMAL HIGH (ref 0–1000)

## 2015-04-09 LAB — GLUCOSE, SEROUS FLUID: GLUCOSE FL: 97 mg/dL

## 2015-04-09 LAB — PROTEIN, BODY FLUID: TOTAL PROTEIN, FLUID: 3.8 g/dL

## 2015-04-09 LAB — LACTATE DEHYDROGENASE, PLEURAL OR PERITONEAL FLUID: LD FL: 164 U/L — AB (ref 3–23)

## 2015-04-09 MED ORDER — LEVOFLOXACIN 500 MG PO TABS: 500.0000 mg | ORAL_TABLET | Freq: Every day | ORAL | Status: DC

## 2015-04-09 MED ORDER — CEPHALEXIN 500 MG PO CAPS: 500.0000 mg | ORAL_CAPSULE | Freq: Three times a day (TID) | ORAL | Status: DC

## 2015-04-09 MED ORDER — LABETALOL HCL 200 MG PO TABS: 200.0000 mg | ORAL_TABLET | Freq: Two times a day (BID) | ORAL | Status: DC

## 2015-04-09 MED ORDER — LIDOCAINE HCL (PF) 1 % IJ SOLN
INTRAMUSCULAR | Status: AC
  Filled 2015-04-09: qty 10

## 2015-04-09 MED ORDER — POTASSIUM CHLORIDE CRYS ER 10 MEQ PO TBCR: 40.0000 meq | EXTENDED_RELEASE_TABLET | Freq: Every day | ORAL | Status: DC

## 2015-04-09 MED ORDER — POTASSIUM CHLORIDE CRYS ER 20 MEQ PO TBCR
40.0000 meq | EXTENDED_RELEASE_TABLET | ORAL | Status: DC
  Administered 2015-04-09: 40 meq via ORAL

## 2015-04-09 MED ORDER — ACETAMINOPHEN 325 MG PO TABS: 650.0000 mg | ORAL_TABLET | Freq: Four times a day (QID) | ORAL | Status: DC | PRN

## 2015-04-09 NOTE — Evaluation (Signed)
Physical Therapy Evaluation Patient Details Name: Crystal Petersen MRN: [id number] DOB: Jan 07, 1922 Today's Date: 04/09/2015   History of Present Illness  pt is a 81 y/o female with h/o OA, HTN, dementia, diastolic dysfunction admitted with chest, low back and abd pain.  Found to have a UTI in the ED; chest xray showing bilateral pleural effusions.  Clinical Impression   pt presents as safe to return to SNF at a functional level similar to baseline, but could benefit from PT to improve strength, conditioning and indepedence. There are no further acute PT needs.  Will sign off at this time.     Follow Up Recommendations SNF    Equipment Recommendations  None recommended by PT    Recommendations for Other Services       Precautions / Restrictions Precautions Precautions: Fall      Mobility  Bed Mobility Overal bed mobility: Needs Assistance Bed Mobility: Supine to Sit     Supine to sit: Min guard     General bed mobility comments: HOB relatively flat.  Transfers Overall transfer level: Needs assistance   Transfers: Sit to/from Stand Sit to Stand: Min guard         General transfer comment: cues for hand placement and safety.  pt likely frequently pulls up on the RW  Ambulation/Gait Ambulation/Gait assistance: Min guard Ambulation Distance (Feet): 70 Feet Assistive device: Rolling walker (2 wheeled) Gait Pattern/deviations: Step-through pattern;Decreased stride length   Gait velocity interpretation: Below normal speed for age/gender    Stairs            Wheelchair Mobility    Modified Rankin (Stroke Patients Only)       Balance Overall balance assessment: Needs assistance Sitting-balance support: No upper extremity supported Sitting balance-Leahy Scale: Fair     Standing balance support: No upper extremity supported Standing balance-Leahy Scale: Poor Standing balance comment: reliant on the RW                              Pertinent Vitals/Pain Pain Assessment: Faces Faces Pain Scale: Hurts even more Pain Location: left chest  ?aspiration site. Pain Descriptors / Indicators: Aching;Burning;Grimacing;Moaning Pain Intervention(s): Monitored during session    Home Living Family/patient expects to be discharged to:: Skilled nursing facility                      Prior Function Level of Independence: Needs assistance   Gait / Transfers Assistance Needed: uses RW and mobilizes without assist or supervision  ADL's / Homemaking Assistance Needed: help with bathing and dressing, toiltet without assist        Hand Dominance        Extremity/Trunk Assessment   Upper Extremity Assessment: Defer to OT evaluation           Lower Extremity Assessment: Overall WFL for tasks assessed;Generalized weakness (proximal weaknesses)         Communication   Communication: No difficulties  Cognition Arousal/Alertness: Awake/alert;Lethargic Behavior During Therapy: Flat affect Overall Cognitive Status: History of cognitive impairments - at baseline                      General Comments      Exercises        Assessment/Plan    PT Assessment All further PT needs can be met in the next venue of care  PT Diagnosis Generalized weakness   PT Problem List  Decreased strength;Decreased balance;Decreased mobility  PT Treatment Interventions     PT Goals (Current goals can be found in the Care Plan section) Acute Rehab PT Goals PT Goal Formulation: All assessment and education complete, DC therapy    Frequency     Barriers to discharge        Co-evaluation PT/OT/SLP Co-Evaluation/Treatment: Yes Reason for Co-Treatment: Necessary to address cognition/behavior during functional activity PT goals addressed during session: Mobility/safety with mobility         End of Session   Activity Tolerance: Patient tolerated treatment well Patient left: in bed;with call bell/phone  within reach;with family/visitor present Nurse Communication: Mobility status         Time: 8675-4492 PT Time Calculation (min) (ACUTE ONLY): 16 min   Charges:   PT Evaluation $Initial PT Evaluation Tier I: 1 Procedure     PT G Codes:        Angeldejesus Callaham, Tessie Fass 04/09/2015, 2:26 PM  04/09/2015  Donnella Sham, [place] [phone]  (pager)

## 2015-04-09 NOTE — Progress Notes (Signed)
Discharged to snf with family office visits in place teaching done

## 2015-04-09 NOTE — Procedures (Signed)
[place] Left thoracentesis Samples sent for requested labs CXR to folllow No complication No blood loss. See complete dictation in [facility].

## 2015-04-09 NOTE — Evaluation (Signed)
Occupational Therapy Evaluation Patient Details Name: Crystal Petersen MRN: [id number] DOB: 28-Apr-1921 Today's Date: 04/09/2015    History of Present Illness pt is a 81 y/o female with h/o OA, HTN, dementia, diastolic dysfunction admitted with chest, low back and abd pain.  Found to have a UTI in the ED; chest xray showing bilateral pleural effusions.   Clinical Impression   Patient evaluated by Occupational Therapy with no further acute OT needs identified. All education has been completed and the patient has no further questions. All further needs can be addressed at SNF. See below for any follow-up Occupational Therapy or equipment needs. OT is signing off. Thank you for this referral.      Follow Up Recommendations  SNF;Supervision/Assistance - 24 hour    Equipment Recommendations  None recommended by OT    Recommendations for Other Services       Precautions / Restrictions Precautions Precautions: Fall Restrictions Weight Bearing Restrictions: No      Mobility Bed Mobility Overal bed mobility: Needs Assistance Bed Mobility: Supine to Sit     Supine to sit: Min guard     General bed mobility comments: HOB relatively flat.  Transfers Overall transfer level: Needs assistance   Transfers: Sit to/from Stand Sit to Stand: Min guard         General transfer comment: cues for hand placement and safety.  pt likely frequently pulls up on the RW    Balance Overall balance assessment: Needs assistance Sitting-balance support: No upper extremity supported Sitting balance-Leahy Scale: Fair     Standing balance support: No upper extremity supported Standing balance-Leahy Scale: Poor Standing balance comment: reliant on the RW                            ADL Overall ADL's : Needs assistance/impaired Eating/Feeding: Set up;Sitting   Grooming: Wash/dry hands;Wash/dry face;Oral care;Brushing hair;Standing;Min guard   Upper Body Bathing: Moderate  assistance;Sitting   Lower Body Bathing: Sit to/from stand;Maximal assistance   Upper Body Dressing : Maximal assistance;Sitting   Lower Body Dressing: Maximal assistance;Sit to/from stand   Toilet Transfer: Min guard;Ambulation;Comfort height toilet;Grab bars;RW   Toileting- Water quality scientist and Hygiene: Min guard;Sit to/from stand       Functional mobility during ADLs: Min guard;Rolling walker General ADL Comments: limited by pain      Vision     Perception     Praxis      Pertinent Vitals/Pain Pain Assessment: Faces Faces Pain Scale: Hurts even more Pain Location: left chest  Pain Descriptors / Indicators: Aching Pain Intervention(s): Monitored during session     Hand Dominance     Extremity/Trunk Assessment Upper Extremity Assessment Upper Extremity Assessment: Generalized weakness   Lower Extremity Assessment Lower Extremity Assessment: Defer to PT evaluation       Communication Communication Communication: No difficulties   Cognition Arousal/Alertness: Awake/alert;Lethargic Behavior During Therapy: Flat affect Overall Cognitive Status: History of cognitive impairments - at baseline                     General Comments       Exercises       Shoulder Instructions      Home Living Family/patient expects to be discharged to:: Skilled nursing facility  Prior Functioning/Environment Level of Independence: Needs assistance  Gait / Transfers Assistance Needed: uses RW and mobilizes without assist or supervision ADL's / Homemaking Assistance Needed: help with bathing and dressing, toiltet without assist        OT Diagnosis: Generalized weakness;Acute pain   OT Problem List: Decreased strength;Decreased activity tolerance;Impaired balance (sitting and/or standing);Decreased cognition;Pain   OT Treatment/Interventions:      OT Goals(Current goals can be found in the care plan  section) Acute Rehab OT Goals Patient Stated Goal: did not state  OT Goal Formulation: All assessment and education complete, DC therapy  OT Frequency:     Barriers to D/C:            Co-evaluation PT/OT/SLP Co-Evaluation/Treatment: Yes Reason for Co-Treatment: For patient/therapist safety PT goals addressed during session: Mobility/safety with mobility OT goals addressed during session: ADL's and self-care      End of Session Equipment Utilized During Treatment: Rolling walker Nurse Communication: Mobility status;Patient requests pain meds  Activity Tolerance: Patient limited by pain Patient left: in bed;with call bell/phone within reach;with bed alarm set;with family/visitor present   Time: [id number] OT Time Calculation (min): 16 min Charges:  OT General Charges $OT Visit: 1 Procedure OT Evaluation $Initial OT Evaluation Tier I: 1 Procedure G-Codes:    Aviyah Swetz, Ellard Artis M 04-13-15, 2:40 PM

## 2015-04-09 NOTE — NC FL2 (Signed)
[place] LEVEL OF CARE SCREENING TOOL     IDENTIFICATION  Patient Name: Crystal Petersen Birthdate: June 15, 1921 Sex: female Admission Date (Current Location): 04/07/2015  [place] and [place] Number:  Kathleen Argue [id number] [place] and Address:  [facility] [place], [place] [address], [place], [place] [address]      Provider Number: [id number]  Attending Physician Name and Address:  Reyne Dumas, MD  Relative Name and Phone Number:       Current Level of Care: Hospital Recommended Level of Care: [place] Prior Approval Number:    Date Approved/Denied:   PASRR Number: [id number]  Discharge Plan: SNF    Current Diagnoses: Patient Active Problem List   Diagnosis Date Noted  . Chest pain 04/07/2015  . Pleural effusion, left 04/07/2015  . Diastolic CHF, acute on chronic (HCC) 04/07/2015  . UTI (urinary tract infection) 04/07/2015  . Anal prolapse 03/23/2015  . Anemia 03/23/2015  . Hypokalemia 03/15/2015  . Constipation 07/19/2014  . Dementia of the Alzheimer's type 05/22/2014  . Macular degeneration 05/22/2014  . Cystocele 05/22/2014  . Melanoma of skin ([place]) 05/22/2014  . Unstable gait 05/22/2014  . Nodule of left lung 05/22/2014  . Adenomatous colon polyp 05/22/2014  . Osteoporosis 05/22/2014  . History of herpes zoster 05/22/2014  . Gastroparesis 05/22/2014  . Deaf 05/22/2014  . Periprosthetic fracture around internal prosthetic right hip joint ([place]) 04/12/2014  . Primary osteoarthritis of right hip 03/27/2014  . Prediabetes 11/17/2013  . Vitamin D deficiency 11/17/2013  . Hyperlipidemia 07/05/2013  . Hypothyroidism 07/05/2013  . Essential hypertension 07/05/2013  . Diastolic dysfunction 123456  . Stricture and stenosis of esophagus 10/27/2007  . GERD 10/25/2007  . Diverticulosis of large intestine 06/28/2004    Orientation RESPIRATION BLADDER Height & Weight    Self, Place  Normal Incontinent 5\' 5"  (165.1  cm) 129 lbs.  BEHAVIORAL SYMPTOMS/MOOD NEUROLOGICAL BOWEL NUTRITION STATUS  Other (Comment) (n/a)  (n/a) Incontinent Diet (low sodium heart healthy)  AMBULATORY STATUS COMMUNICATION OF NEEDS Skin   Limited Assist Verbally Normal                       Personal Care Assistance Level of Assistance  Bathing, Feeding, Dressing Bathing Assistance: Maximum assistance Feeding assistance: Limited assistance Dressing Assistance: Maximum assistance     Functional Limitations Info  Sight, Hearing, Speech Sight Info: Adequate Hearing Info: Impaired Speech Info: Adequate    SPECIAL CARE FACTORS FREQUENCY                       Contractures      Additional Factors Info  Code Status, Allergies Code Status Info: DNR Allergies Info: Ace inhibitors, Statins, Vicodin, Fosamax, Metoclopramide Hcl, Sulfa Antibiotics, Sulfonamide Derivatives, Vesicare           Current Medications (04/09/2015):  This is the current hospital active medication list Current Facility-Administered Medications  Medication Dose Route Frequency Provider Last Rate Last Dose  . 0.9 %  sodium chloride infusion   Intravenous Continuous Theressa Millard, MD 50 mL/hr at 04/08/15 1841    . 0.9 %  sodium chloride infusion  250 mL Intravenous PRN Theressa Millard, MD      . acetaminophen (TYLENOL) tablet 650 mg  650 mg Oral Q6H PRN Theressa Millard, MD   650 mg at 04/09/15 1140   Or  . acetaminophen (TYLENOL) suppository 650 mg  650 mg Rectal Q6H PRN Harvette C  Arnoldo Morale, MD      . alum & mag hydroxide-simeth (MAALOX/MYLANTA) 200-200-20 MG/5ML suspension 30 mL  30 mL Oral Q6H PRN Theressa Millard, MD      . aspirin EC tablet 325 mg  325 mg Oral Daily Theressa Millard, MD   325 mg at 04/09/15 1046  . calcium carbonate (TUMS - dosed in mg elemental calcium) chewable tablet 200 mg of elemental calcium  1 tablet Oral Daily PRN Theressa Millard, MD      . cephALEXin (KEFLEX) capsule 500 mg  500 mg Oral Q12H  Laura A Harduk, PA-C   500 mg at 04/09/15 1046  . cholecalciferol (VITAMIN D) tablet 4,000 Units  4,000 Units Oral Daily Theressa Millard, MD   4,000 Units at 04/09/15 1045  . docusate sodium (COLACE) capsule 100 mg  100 mg Oral BID PRN Theressa Millard, MD      . famotidine (PEPCID) tablet 20 mg  20 mg Oral QHS Theressa Millard, MD   20 mg at 04/08/15 2325  . hydrALAZINE (APRESOLINE) injection 5 mg  5 mg Intravenous Q6H PRN Theressa Millard, MD   5 mg at 04/07/15 2319  . hydrALAZINE (APRESOLINE) injection 5 mg  5 mg Intravenous Q6H PRN Reyne Dumas, MD      . HYDROmorphone (DILAUDID) injection 0.5-1 mg  0.5-1 mg Intravenous Q3H PRN Theressa Millard, MD      . labetalol (NORMODYNE) tablet 200 mg  200 mg Oral BID Reyne Dumas, MD   200 mg at 04/09/15 1047  . levothyroxine (SYNTHROID, LEVOTHROID) tablet 100 mcg  100 mcg Oral QAC breakfast Theressa Millard, MD   100 mcg at 04/09/15 0605  . lidocaine (PF) (XYLOCAINE) 1 % injection           . memantine (NAMENDA XR) 24 hr capsule 28 mg  28 mg Oral Daily Theressa Millard, MD   28 mg at 04/09/15 1046  . multivitamin (PROSIGHT) tablet 1 tablet  1 tablet Oral BID Theressa Millard, MD   1 tablet at 04/09/15 1046  . ondansetron (ZOFRAN) tablet 4 mg  4 mg Oral Q6H PRN Theressa Millard, MD       Or  . ondansetron (ZOFRAN) injection 4 mg  4 mg Intravenous Q6H PRN Harvette Evonnie Dawes, MD      . potassium chloride SA (K-DUR,KLOR-CON) CR tablet 40 mEq  40 mEq Oral Q2H Reyne Dumas, MD      . senna-docusate (Senokot-S) tablet 2 tablet  2 tablet Oral QHS Theressa Millard, MD   2 tablet at 04/08/15 2325  . sodium chloride 0.9 % injection 3 mL  3 mL Intravenous Q12H Theressa Millard, MD   3 mL at 04/09/15 1000  . sodium chloride 0.9 % injection 3 mL  3 mL Intravenous Q12H Theressa Millard, MD   3 mL at 04/09/15 1000  . sodium chloride 0.9 % injection 3 mL  3 mL Intravenous PRN Theressa Millard, MD         Discharge Medications: Please see  discharge summary for a list of discharge medications.  Relevant Imaging Results:  Relevant Lab Results:   Additional Information  Return to [facility].   Benay Pike [place], [place]

## 2015-04-09 NOTE — Discharge Summary (Signed)
Physician Discharge Summary  Crystal Petersen MRN: [id number] DOB/AGE: February 23, 1922 81 y.o.  PCP: Alesia Richards, MD   Admit date: 04/07/2015 Discharge date: 04/09/2015  Discharge Diagnoses:     Active Problems:   Hypothyroidism   Essential hypertension   Dementia of the Alzheimer's type   Chest pain   Pleural effusion, left   Diastolic CHF, acute on chronic (HCC)   UTI (urinary tract infection)    Follow-up recommendations Follow-up with PCP in 3-5 days , including all  additional recommended appointments as below Follow-up CBC, CMP in 3-5 days Exudative pleural effusion, follow cytology and culture results of the pleural effusion Follow-up on the results of urine culture     Medication List    TAKE these medications        aspirin EC 81 MG tablet  Take 81 mg by mouth daily.     CAL-GEST ANTACID 500 MG chewable tablet  Generic drug:  calcium carbonate  Chew 1 tablet by mouth daily as needed for indigestion or heartburn.     cephALEXin 500 MG capsule  Commonly known as:  KEFLEX  Take 1 capsule (500 mg total) by mouth 3 (three) times daily.     docusate sodium 100 MG capsule  Commonly known as:  COLACE  Take 100 mg by mouth 2 (two) times daily as needed for mild constipation.     famotidine 20 MG tablet  Commonly known as:  PEPCID  Take 20 mg by mouth at bedtime.     guaiFENesin 100 MG/5ML Soln  Commonly known as:  ROBITUSSIN  Take 10 mLs by mouth every 6 (six) hours as needed for cough or to loosen phlegm.     labetalol 100 MG tablet  Commonly known as:  NORMODYNE  TAKE 1 TABLET BY MOUTH TWICE A DAY     levothyroxine 100 MCG tablet  Commonly known as:  SYNTHROID, LEVOTHROID  Take 100 mcg by mouth daily before breakfast.     memantine 28 MG Cp24 24 hr capsule  Commonly known as:  NAMENDA XR  Take 28 mg by mouth daily.     potassium chloride 10 MEQ tablet  Commonly known as:  K-DUR,KLOR-CON  Take 4 tablets (40 mEq total) by mouth daily.     PRESERVISION AREDS 2 Caps  Take 1 capsule by mouth 2 (two) times daily.     sennosides-docusate sodium 8.6-50 MG tablet  Commonly known as:  SENOKOT-S  Take 2 tablets by mouth at bedtime. One at bedtime     Vitamin D 2000 UNITS tablet  Take 4,000 Units by mouth daily.         Discharge Condition: *Stable   Discharge Instructions       Discharge Instructions    Diet - low sodium heart healthy    Complete by:  As directed      Diet - low sodium heart healthy    Complete by:  As directed      Increase activity slowly    Complete by:  As directed      Increase activity slowly    Complete by:  As directed                Disposition: 03-Skilled Nursing Facility   Consults:  None   Significant Diagnostic Studies:  Dg Chest 1 View  04/09/2015  CLINICAL DATA:  Follow up left pleural effusion. Thoracentesis today. EXAM: CHEST 1 VIEW COMPARISON:  Radiographs and CT 04/07/2015. FINDINGS: 0906 hours. Interval near-complete evacuation of  previously demonstrated large left pleural effusion. There has been re-expansion of the left lung. Pulmonary edema has resolved. There is no pneumothorax. The heart size and mediastinal contours are stable. Posttraumatic deformity of the proximal left humerus again noted. IMPRESSION: Interval near-complete evacuation of left-sided pleural effusion. No pneumothorax. Electronically Signed   By: Richardean Sale M.D.   On: 04/09/2015 10:01   Ct Chest W Contrast  04/07/2015  CLINICAL DATA:  Left side chest pain, shortness of breath for 4 days EXAM: CT CHEST WITH CONTRAST TECHNIQUE: Multidetector CT imaging of the chest was performed during intravenous contrast administration. CONTRAST:  14m OMNIPAQUE IOHEXOL 300 MG/ML  SOLN COMPARISON:  Chest x-ray same day FINDINGS: Sagittal images of the spine shows diffuse osteopenia. Mild degenerative changes thoracic spine. Atherosclerotic calcifications of thoracic aorta are noted. There is no evidence of  aortic dissection. Central pulmonary artery is unremarkable. No central pulmonary embolus is noted. There is moderate to large left pleural effusion. Significant compressive atelectasis noted in left lower lobe. There is mild loculated anterior pleural effusion in lingula. Mild atelectasis or infiltrate in lingula. Calcified mediastinal and left hilar lymph nodes are noted probable due to prior granulomatous disease. There is small left pleural effusion and minimal left base posterior atelectasis. Central airways are patent. Trace anterior pericardial effusion. There is no pulmonary edema. The visualized upper abdomen is unremarkable. IMPRESSION: 1. There is moderate to large left pleural effusion. Significant compressive atelectasis noted in left lower lobe. Small amount of fluid noted along the left fissure. Mild loculated pleural effusion in lingula. Small atelectasis or infiltrate in lingula. 2. No aortic aneurysm or dissection. Atherosclerotic calcifications of thoracic aorta. No central pulmonary embolus. 3. There is small right pleural effusion with right base posterior atelectasis. No pulmonary edema. 4. Calcified mediastinal and left hilar lymph node probable due to prior granulomatous disease. 5. Osteopenia and degenerative changes thoracic spine. Electronically Signed   By: LLahoma CrockerM.D.   On: 04/07/2015 20:05   Dg Chest Port 1 View  04/07/2015  CLINICAL DATA:  Pt here with c/o LEFT CP, does not radiate to arm. Pain has subsided some since earlier. Denies hx heart/lung issues. Hx HTN - on meds EXAM: PORTABLE CHEST - 1 VIEW COMPARISON:  03/20/2014 FINDINGS: Moderate left and smaller right pleural effusions, new since prior exam. Consolidation/atelectasis at the left lung base. Interstitial edema/infiltrates and probable central pulmonary vascular congestion, new since prior study. Heart size of prev to assess due to adjacent opacities. Atheromatous aorta. No pneumothorax. Left humeral surgical neck  fracture as before. IMPRESSION: 1. New bilateral edema/infiltrates and asymmetric effusions left greater than right. Electronically Signed   By: DLucrezia EuropeM.D.   On: 04/07/2015 17:16   UKoreaThoracentesis Asp Pleural Space W/img Guide  04/09/2015  CLINICAL DATA:  Shortness of Breath, large left pleural effusion EXAM: EXAM THORACENTESIS WITH ULTRASOUND TECHNIQUE: The procedure, risks (including but not limited to bleeding, infection, organ damage ), benefits, and alternatives were explained to the patient. Questions regarding the procedure were encouraged and answered. The patient understands and consents to the procedure. Survey ultrasound of the left hemithorax was performed and an appropriate skin entry site was localized. Site was marked, prepped with Betadine, draped in usual sterile fashion, infiltrated locally with 1% lidocaine. The Saf-T-Centesis needle was advanced into the pleural space. Clear yellow fluid returned. 850 mL was removed. A sample sent for the requested lab studies. Post procedure imaging shows no residual fluid. The patient tolerated procedure well. COMPLICATIONS:  COMPLICATIONS None immediate IMPRESSION: Technically successful ultrasound-guided left thoracentesis. Follow-up chest radiograph pending. Electronically Signed   By: Lucrezia Europe M.D.   On: 04/09/2015 10:12        Filed Weights   04/08/15 0500 04/09/15 0451  Weight: 61.236 kg (135 lb) 58.877 kg (129 lb 12.8 oz)     Microbiology: Recent Results (from the past 240 hour(s))  Urine culture     Status: None (Preliminary result)   Collection Time: 04/07/15  6:47 PM  Result Value Ref Range Status   Specimen Description URINE, CLEAN CATCH  Final   Special Requests Normal  Final   Culture >=100,000 COLONIES/mL ESCHERICHIA COLI  Final   Report Status PENDING  Incomplete  Culture, body fluid-bottle     Status: None (Preliminary result)   Collection Time: 04/09/15  9:18 AM  Result Value Ref Range Status   Specimen  Description FLUID PLEURAL LEFT  Final   Special Requests BOTTLES DRAWN AEROBIC AND ANAEROBIC 10MLS  Final   Culture PENDING  Incomplete   Report Status PENDING  Incomplete  Gram stain     Status: None   Collection Time: 04/09/15  9:18 AM  Result Value Ref Range Status   Specimen Description FLUID PLEURAL LEFT  Final   Special Requests NONE  Final   Gram Stain   Final    MODERATE WBC PRESENT, PREDOMINANTLY MONONUCLEAR NO ORGANISMS SEEN    Report Status 04/09/2015 FINAL  Final       Blood Culture    Component Value Date/Time   SDES FLUID PLEURAL LEFT 04/09/2015 0918   SDES FLUID PLEURAL LEFT 04/09/2015 0918   SPECREQUEST BOTTLES DRAWN AEROBIC AND ANAEROBIC 10MLS 04/09/2015 0918   SPECREQUEST NONE 04/09/2015 0918   CULT PENDING 04/09/2015 0918   REPTSTATUS PENDING 04/09/2015 0918   REPTSTATUS 04/09/2015 FINAL 04/09/2015 0918      Labs: Results for orders placed or performed during the hospital encounter of 04/07/15 (from the past 48 hour(s))  CBC with Differential/Platelet     Status: None   Collection Time: 04/07/15  4:35 PM  Result Value Ref Range   WBC 8.5 4.0 - 10.5 K/uL   RBC 4.16 3.87 - 5.11 MIL/uL   Hemoglobin 12.0 12.0 - 15.0 g/dL   HCT 38.1 36.0 - 46.0 %   MCV 91.6 78.0 - 100.0 fL   MCH 28.8 26.0 - 34.0 pg   MCHC 31.5 30.0 - 36.0 g/dL   RDW 14.0 11.5 - 15.5 %   Platelets 378 150 - 400 K/uL   Neutrophils Relative % 67 %   Neutro Abs 5.7 1.7 - 7.7 K/uL   Lymphocytes Relative 17 %   Lymphs Abs 1.4 0.7 - 4.0 K/uL   Monocytes Relative 9 %   Monocytes Absolute 0.8 0.1 - 1.0 K/uL   Eosinophils Relative 7 %   Eosinophils Absolute 0.6 0.0 - 0.7 K/uL   Basophils Relative 0 %   Basophils Absolute 0.0 0.0 - 0.1 K/uL  Comprehensive metabolic panel     Status: Abnormal   Collection Time: 04/07/15  4:35 PM  Result Value Ref Range   Sodium 139 135 - 145 mmol/L   Potassium 4.2 3.5 - 5.1 mmol/L   Chloride 104 101 - 111 mmol/L   CO2 25 22 - 32 mmol/L   Glucose, Bld  102 (H) 65 - 99 mg/dL   BUN 17 6 - 20 mg/dL   Creatinine, Ser 0.75 0.44 - 1.00 mg/dL   Calcium 8.9 8.9 - 10.3  mg/dL   Total Protein 6.2 (L) 6.5 - 8.1 g/dL   Albumin 3.0 (L) 3.5 - 5.0 g/dL   AST 16 15 - 41 U/L   ALT 9 (L) 14 - 54 U/L   Alkaline Phosphatase 81 38 - 126 U/L   Total Bilirubin 0.7 0.3 - 1.2 mg/dL   GFR calc non Af Amer >60 >60 mL/min   GFR calc Af Amer >60 >60 mL/min    Comment: (NOTE) The eGFR has been calculated using the CKD EPI equation. This calculation has not been validated in all clinical situations. eGFR's persistently <60 mL/min signify possible Chronic Kidney Disease.    Anion gap 10 5 - 15  Brain natriuretic peptide     Status: Abnormal   Collection Time: 04/07/15  4:35 PM  Result Value Ref Range   B Natriuretic Peptide 117.8 (H) 0.0 - 100.0 pg/mL  I-stat troponin, ED     Status: None   Collection Time: 04/07/15  4:49 PM  Result Value Ref Range   Troponin i, poc 0.00 0.00 - 0.08 ng/mL   Comment 3            Comment: Due to the release kinetics of cTnI, a negative result within the first hours of the onset of symptoms does not rule out myocardial infarction with certainty. If myocardial infarction is still suspected, repeat the test at appropriate intervals.   Urinalysis, Routine w reflex microscopic (not at [facility])     Status: Abnormal   Collection Time: 04/07/15  6:47 PM  Result Value Ref Range   Color, Urine YELLOW YELLOW   APPearance CLOUDY (A) CLEAR   Specific Gravity, Urine 1.010 1.005 - 1.030   pH 6.5 5.0 - 8.0   Glucose, UA NEGATIVE NEGATIVE mg/dL   Hgb urine dipstick TRACE (A) NEGATIVE   Bilirubin Urine NEGATIVE NEGATIVE   Ketones, ur NEGATIVE NEGATIVE mg/dL   Protein, ur NEGATIVE NEGATIVE mg/dL   Nitrite POSITIVE (A) NEGATIVE   Leukocytes, UA SMALL (A) NEGATIVE  Urine microscopic-add on     Status: Abnormal   Collection Time: 04/07/15  6:47 PM  Result Value Ref Range   Squamous Epithelial / LPF 0-5 (A) NONE SEEN   WBC, UA 6-30 0 - 5  WBC/hpf   RBC / HPF 0-5 0 - 5 RBC/hpf   Bacteria, UA MANY (A) NONE SEEN  Urine culture     Status: None (Preliminary result)   Collection Time: 04/07/15  6:47 PM  Result Value Ref Range   Specimen Description URINE, CLEAN CATCH    Special Requests Normal    Culture >=100,000 COLONIES/mL ESCHERICHIA COLI    Report Status PENDING   I-stat troponin, ED     Status: None   Collection Time: 04/07/15  8:30 PM  Result Value Ref Range   Troponin i, poc 0.00 0.00 - 0.08 ng/mL   Comment 3            Comment: Due to the release kinetics of cTnI, a negative result within the first hours of the onset of symptoms does not rule out myocardial infarction with certainty. If myocardial infarction is still suspected, repeat the test at appropriate intervals.   Troponin I     Status: None   Collection Time: 04/08/15  1:25 AM  Result Value Ref Range   Troponin I <0.03 <0.031 ng/mL    Comment:        NO INDICATION OF MYOCARDIAL INJURY.   Basic metabolic panel  Status: Abnormal   Collection Time: 04/08/15  7:33 AM  Result Value Ref Range   Sodium 138 135 - 145 mmol/L   Potassium 3.8 3.5 - 5.1 mmol/L   Chloride 98 (L) 101 - 111 mmol/L   CO2 27 22 - 32 mmol/L   Glucose, Bld 146 (H) 65 - 99 mg/dL   BUN 12 6 - 20 mg/dL   Creatinine, Ser 0.76 0.44 - 1.00 mg/dL   Calcium 8.9 8.9 - 10.3 mg/dL   GFR calc non Af Amer >60 >60 mL/min   GFR calc Af Amer >60 >60 mL/min    Comment: (NOTE) The eGFR has been calculated using the CKD EPI equation. This calculation has not been validated in all clinical situations. eGFR's persistently <60 mL/min signify possible Chronic Kidney Disease.    Anion gap 13 5 - 15  Troponin I     Status: None   Collection Time: 04/08/15  7:33 AM  Result Value Ref Range   Troponin I <0.03 <0.031 ng/mL    Comment:        NO INDICATION OF MYOCARDIAL INJURY.   CBC     Status: Abnormal   Collection Time: 04/08/15  7:33 AM  Result Value Ref Range   WBC 13.7 (H) 4.0 - 10.5  K/uL   RBC 4.52 3.87 - 5.11 MIL/uL   Hemoglobin 13.1 12.0 - 15.0 g/dL   HCT 41.1 36.0 - 46.0 %   MCV 90.9 78.0 - 100.0 fL   MCH 29.0 26.0 - 34.0 pg   MCHC 31.9 30.0 - 36.0 g/dL   RDW 13.9 11.5 - 15.5 %   Platelets 436 (H) 150 - 400 K/uL  Troponin I     Status: None   Collection Time: 04/08/15  1:30 PM  Result Value Ref Range   Troponin I <0.03 <0.031 ng/mL    Comment:        NO INDICATION OF MYOCARDIAL INJURY.   TSH     Status: None   Collection Time: 04/08/15  1:30 PM  Result Value Ref Range   TSH 3.413 0.350 - 4.500 uIU/mL  CBC     Status: Abnormal   Collection Time: 04/09/15  5:00 AM  Result Value Ref Range   WBC 9.7 4.0 - 10.5 K/uL   RBC 3.90 3.87 - 5.11 MIL/uL   Hemoglobin 11.1 (L) 12.0 - 15.0 g/dL   HCT 35.2 (L) 36.0 - 46.0 %   MCV 90.3 78.0 - 100.0 fL   MCH 28.5 26.0 - 34.0 pg   MCHC 31.5 30.0 - 36.0 g/dL   RDW 14.2 11.5 - 15.5 %   Platelets 371 150 - 400 K/uL  Comprehensive metabolic panel     Status: Abnormal   Collection Time: 04/09/15  5:00 AM  Result Value Ref Range   Sodium 138 135 - 145 mmol/L   Potassium 3.2 (L) 3.5 - 5.1 mmol/L   Chloride 102 101 - 111 mmol/L   CO2 25 22 - 32 mmol/L   Glucose, Bld 91 65 - 99 mg/dL   BUN 19 6 - 20 mg/dL   Creatinine, Ser 0.74 0.44 - 1.00 mg/dL   Calcium 8.5 (L) 8.9 - 10.3 mg/dL   Total Protein 5.3 (L) 6.5 - 8.1 g/dL   Albumin 2.8 (L) 3.5 - 5.0 g/dL   AST 13 (L) 15 - 41 U/L   ALT 8 (L) 14 - 54 U/L   Alkaline Phosphatase 69 38 - 126 U/L   Total Bilirubin 0.5 0.3 -  1.2 mg/dL   GFR calc non Af Amer >60 >60 mL/min   GFR calc Af Amer >60 >60 mL/min    Comment: (NOTE) The eGFR has been calculated using the CKD EPI equation. This calculation has not been validated in all clinical situations. eGFR's persistently <60 mL/min signify possible Chronic Kidney Disease.    Anion gap 11 5 - 15  Lactate dehydrogenase (CSF, pleural or peritoneal fluid)     Status: Abnormal   Collection Time: 04/09/15  9:16 AM  Result Value Ref  Range   LD, Fluid 164 (H) 3 - 23 U/L    Comment: (NOTE) Results should be evaluated in conjunction with serum values    Fluid Type-FLDH Pleural, L   Protein, pleural or peritoneal fluid     Status: None   Collection Time: 04/09/15  9:16 AM  Result Value Ref Range   Total protein, fluid 3.8 g/dL    Comment: (NOTE) No normal range established for this test Results should be evaluated in conjunction with serum values    Fluid Type-FTP Pleural, L   Body fluid cell count with differential     Status: Abnormal   Collection Time: 04/09/15  9:16 AM  Result Value Ref Range   Fluid Type-FCT Pleural, L    Color, Fluid YELLOW (A) YELLOW   Appearance, Fluid HAZY (A) CLEAR   WBC, Fluid 1260 (H) 0 - 1000 cu mm   Neutrophil Count, Fluid 22 0 - 25 %   Lymphs, Fluid 41 %   Monocyte-Macrophage-Serous Fluid 34 (L) 50 - 90 %   Eos, Fluid 3 %   Other Cells, Fluid MESOTHELIAL CELLS %  Glucose, pleural or peritoneal fluid     Status: None   Collection Time: 04/09/15  9:16 AM  Result Value Ref Range   Glucose, Fluid 97 mg/dL    Comment: (NOTE) No normal range established for this test Results should be evaluated in conjunction with serum values    Fluid Type-FGLU Pleural, L   Culture, body fluid-bottle     Status: None (Preliminary result)   Collection Time: 04/09/15  9:18 AM  Result Value Ref Range   Specimen Description FLUID PLEURAL LEFT    Special Requests BOTTLES DRAWN AEROBIC AND ANAEROBIC 10MLS    Culture PENDING    Report Status PENDING   Gram stain     Status: None   Collection Time: 04/09/15  9:18 AM  Result Value Ref Range   Specimen Description FLUID PLEURAL LEFT    Special Requests NONE    Gram Stain      MODERATE WBC PRESENT, PREDOMINANTLY MONONUCLEAR NO ORGANISMS SEEN    Report Status 04/09/2015 FINAL      Lipid Panel     Component Value Date/Time   CHOL 209* 07/06/2014 1726   TRIG 160* 07/06/2014 1726   HDL 66 07/06/2014 1726   CHOLHDL 3.2 07/06/2014 1726   VLDL  32 07/06/2014 1726   LDLCALC 111* 07/06/2014 1726     Lab Results  Component Value Date   HGBA1C 5.8* 07/06/2014     Lab Results  Component Value Date   LDLCALC 111* 07/06/2014   CREATININE 0.74 04/09/2015     HPI: Crystal Petersen is a 81 y.o. female with a history of Diastolic dysfunction, HTN, Hypothyroid and Alzheimer's Dementia who was sent from the [facility] to the ED due to complaints of chest pain this afternoon, and ABD and low back pain this AM. She was evalauted in the  ED and was found to have a UTI, and was placed on Keflex. A Chest X-ray revealed a Bilateral Pleuarl Effusions, with the left greater than right. The initial cardiac workup has been negative.   Assessment and plan Escherichia coli  UTI-initially started on IV Rocephin, now switched to Levaquin, 10 days  Exudative left pleural effusion-status post removal of 850 ml of pleural fluid Please follow up on the results of pleural fluid culture and cytology  Chest pain likely musculoskeletal vs secondary to left-sided pleural effusion which appears to be exudative , cardiac enzymes negative, CT scan shows moderate to large left-sided pleural effusion that would need further evaluation , status post thoracentesis No pneumonia on follow-up chest x-ray No PE in the central pulmonary artery, 2-D echo within normal limitsnormal, no wall motion abnormalities, EF 50-72%, grade 1 diastolic dysfunction Chest pain likely secondary to exudative pleural effusion Patient to be treated with an extended course of antibiotics , Levaquin 10 days  Hypertension-uncontrolled, increased labetalol, etc.  Hypothyroidism, continue Synthroid , TSH 3.41    Discharge Exam:    Blood pressure 161/83, pulse 79, temperature 98.7 F (37.1 C), temperature source Oral, resp. rate 18, height 5' 5"  (1.651 m), weight 58.877 kg (129 lb 12.8 oz), SpO2 93 %.  General: No acute respiratory distress Lungs: Clear to auscultation  bilaterally without wheezes or crackles Cardiovascular: Regular rate and rhythm without murmur gallop or rub normal S1 and S2 Abdomen: Nontender, nondistended, soft, bowel sounds positive, no rebound, no ascites, no appreciable mass Extremities: No significant cyanosis, clubbing, or edema bilateral lower extremities    Follow-up Information    Follow up with Alesia Richards, MD. Schedule an appointment as soon as possible for a visit in 3 days.   Specialty:  Internal Medicine   Contact information:   [address] [place] [address] [phone]       Signed: Reyne Dumas 04/09/2015, 12:49 PM        Time spent >45 mins

## 2015-04-09 NOTE — Progress Notes (Signed)
Resident of [facility]- and current resides in dementia care at the facility.  . CSW notified that patient's d/c today and completed Fl2. DC summary sent to facility for review..  Patient's son plans to transport patient via car with help of their daughter.  Nursing notified to call report.  No further CSW needs identified.  CSW signing off.  Lorie Phenix. Pauline Good, [place]

## 2015-04-10 ENCOUNTER — Encounter: Payer: Self-pay | Admitting: Nurse Practitioner

## 2015-04-10 ENCOUNTER — Non-Acute Institutional Stay (SKILLED_NURSING_FACILITY): Payer: Medicare Other | Admitting: Nurse Practitioner

## 2015-04-10 DIAGNOSIS — N390 Urinary tract infection, site not specified: Secondary | ICD-10-CM

## 2015-04-10 DIAGNOSIS — E890 Postprocedural hypothyroidism: Secondary | ICD-10-CM | POA: Diagnosis not present

## 2015-04-10 DIAGNOSIS — G309 Alzheimer's disease, unspecified: Secondary | ICD-10-CM

## 2015-04-10 DIAGNOSIS — F0280 Dementia in other diseases classified elsewhere without behavioral disturbance: Secondary | ICD-10-CM

## 2015-04-10 DIAGNOSIS — K219 Gastro-esophageal reflux disease without esophagitis: Secondary | ICD-10-CM

## 2015-04-10 DIAGNOSIS — R079 Chest pain, unspecified: Secondary | ICD-10-CM | POA: Diagnosis not present

## 2015-04-10 DIAGNOSIS — J948 Other specified pleural conditions: Secondary | ICD-10-CM | POA: Diagnosis not present

## 2015-04-10 DIAGNOSIS — K5900 Constipation, unspecified: Secondary | ICD-10-CM

## 2015-04-10 DIAGNOSIS — I10 Essential (primary) hypertension: Secondary | ICD-10-CM

## 2015-04-10 DIAGNOSIS — E876 Hypokalemia: Secondary | ICD-10-CM | POA: Diagnosis not present

## 2015-04-10 DIAGNOSIS — J90 Pleural effusion, not elsewhere classified: Secondary | ICD-10-CM

## 2015-04-10 LAB — URINE CULTURE
Culture: >=100000
SPECIAL REQUESTS: NORMAL

## 2015-04-10 NOTE — Progress Notes (Signed)
Patient ID: Crystal Petersen, female   DOB: [birth year], 81 y.o.   MRN: [id number]  Location:  SNF [facility] Provider:  Marlana Latus NP  Code Status:  DNR Goals of care: Advanced Directive information    Chief Complaint  Patient presents with  . Medical Management of Chronic Issues     HPI: Patient is a 81 y.o. female seen in the SNF at [facility] today for evaluation of Hx of GERD symptomatic, last TSH wnl 09/2014 while on Levothyroxine 157mcg, controlled blood pressure while taking Labetalol 100mg  bid, taking Namenda for memory loss, she is functioning adequately in SNF. Denied pain, constipation, or trouble sleeping at night.     04/07/15-04/09/15 hospitalized for chest pain,  ABD and low back pain. She was found to have UTI, and was placed on Keflex. A Chest X-ray revealed a Bilateral Pleuarl Effusions, with the left greater than right. The initial cardiac workup has been negative.   Review of Systems  Constitutional: Negative for fever and chills.  HENT: Positive for hearing loss. Negative for congestion, ear discharge and ear pain.   Eyes: Negative for pain, discharge and redness.  Respiratory: Negative for cough, shortness of breath and wheezing.   Cardiovascular: Negative for chest pain and leg swelling.  Gastrointestinal: Negative for nausea, vomiting, abdominal pain and constipation.  Genitourinary: Positive for frequency. Negative for dysuria and urgency.  Musculoskeletal: Positive for back pain. Negative for myalgias and neck pain.  Skin: Negative for rash.  Neurological: Negative for dizziness, tremors, seizures, weakness and headaches.  Psychiatric/Behavioral: Positive for memory loss. Negative for suicidal ideas and hallucinations. The patient is nervous/anxious.     Past Medical History  Diagnosis Date  . Hypertension   . Nonspecific abnormal electrocardiogram (ECG) (EKG)   . Stricture and stenosis of esophagus   . Esophageal reflux   . Diverticulosis of  colon (without mention of hemorrhage)   . Benign neoplasm of colon   . Macular degeneration   . Osteoarthritis     right hip  . Other and unspecified hyperlipidemia   . HTN (hypertension)   . Shingles   . Cystocele   . Gastroparesis   . Skin cancer (melanoma) ([place])   . Hypothyroidism   . Cancer ([place])     pre-melanoma  . Hemorrhoids   . Primary osteoarthritis of right hip 03/27/2014  . Acute blood loss anemia 03/29/2014    03/30/14 Hgb 7.3 04/04/14 Hgb 8.5 continue Fe bid.  05/04/14 Hgb 10.8   . Dementia of the Alzheimer's type 05/22/2014    05/19/14 MMSE 18/30 Namenda.    . Diastolic dysfunction [id number]  . Diverticulosis of large intestine 06/28/2004  . Essential hypertension 07/05/2013  . Hyperlipidemia 07/05/2013  . Internal hemorrhoids without mention of complication [id number]  . Prediabetes 11/17/2013  . Stricture and stenosis of esophagus 10/27/2007  . Vitamin D deficiency 11/17/2013    Patient Active Problem List   Diagnosis Date Noted  . Chest pain 04/07/2015  . Pleural effusion, left 04/07/2015  . Diastolic CHF, acute on chronic (HCC) 04/07/2015  . UTI (urinary tract infection) 04/07/2015  . Anal prolapse 03/23/2015  . Anemia 03/23/2015  . Hypokalemia 03/15/2015  . Constipation 07/19/2014  . Dementia of the Alzheimer's type 05/22/2014  . Macular degeneration 05/22/2014  . Cystocele 05/22/2014  . Melanoma of skin ([place]) 05/22/2014  . Unstable gait 05/22/2014  . Nodule of left lung 05/22/2014  . Adenomatous colon polyp 05/22/2014  . Osteoporosis 05/22/2014  . History of herpes zoster  05/22/2014  . Gastroparesis 05/22/2014  . Deaf 05/22/2014  . Periprosthetic fracture around internal prosthetic right hip joint ([place]) 04/12/2014  . Primary osteoarthritis of right hip 03/27/2014  . Prediabetes 11/17/2013  . Vitamin D deficiency 11/17/2013  . Hyperlipidemia 07/05/2013  . Hypothyroidism 07/05/2013  . Essential hypertension 07/05/2013  . Diastolic dysfunction 123456    . Stricture and stenosis of esophagus 10/27/2007  . GERD 10/25/2007  . Diverticulosis of large intestine 06/28/2004    Allergies  Allergen Reactions  . Ace Inhibitors Other (See Comments)    Elevated potassium  . Statins Other (See Comments)    Myalgias   . Vicodin [Hydrocodone-Acetaminophen] Other (See Comments)    "felt spaced out"  . Fosamax [Alendronate Sodium] Other (See Comments)  . Metoclopramide Hcl Other (See Comments)  . Sulfa Antibiotics Rash  . Sulfonamide Derivatives Rash  . Vesicare [Solifenacin] Other (See Comments)    Medications: Patient's Medications  New Prescriptions   No medications on file  Previous Medications   ACETAMINOPHEN (TYLENOL) 325 MG TABLET    Take 2 tablets (650 mg total) by mouth every 6 (six) hours as needed.   ASPIRIN EC 81 MG TABLET    Take 81 mg by mouth daily.   CALCIUM CARBONATE (CAL-GEST ANTACID) 500 MG CHEWABLE TABLET    Chew 1 tablet by mouth daily as needed for indigestion or heartburn.   CHOLECALCIFEROL (VITAMIN D) 2000 UNITS TABLET    Take 4,000 Units by mouth daily.   DOCUSATE SODIUM (COLACE) 100 MG CAPSULE    Take 100 mg by mouth 2 (two) times daily as needed for mild constipation.    FAMOTIDINE (PEPCID) 20 MG TABLET    Take 20 mg by mouth at bedtime.   GUAIFENESIN (ROBITUSSIN) 100 MG/5ML SOLN    Take 10 mLs by mouth every 6 (six) hours as needed for cough or to loosen phlegm.   LABETALOL (NORMODYNE) 200 MG TABLET    Take 1 tablet (200 mg total) by mouth 2 (two) times daily.   LEVOFLOXACIN (LEVAQUIN) 500 MG TABLET    Take 1 tablet (500 mg total) by mouth daily.   LEVOTHYROXINE (SYNTHROID, LEVOTHROID) 100 MCG TABLET    Take 100 mcg by mouth daily before breakfast.   MEMANTINE (NAMENDA XR) 28 MG CP24 24 HR CAPSULE    Take 28 mg by mouth daily.   MULTIPLE VITAMINS-MINERALS (PRESERVISION AREDS 2) CAPS    Take 1 capsule by mouth 2 (two) times daily.   POTASSIUM CHLORIDE (K-DUR,KLOR-CON) 10 MEQ TABLET    Take 4 tablets (40 mEq total)  by mouth daily.   SENNOSIDES-DOCUSATE SODIUM (SENOKOT-S) 8.6-50 MG TABLET    Take 2 tablets by mouth at bedtime. One at bedtime  Modified Medications   No medications on file  Discontinued Medications   No medications on file    Physical Exam: Filed Vitals:   04/10/15 1714  BP: 123/72  Pulse: 72  Temp: 98.5 F (36.9 C)  TempSrc: Tympanic  Resp: 18   There is no weight on file to calculate BMI.  Physical Exam  Constitutional: She is oriented to person, place, and time. She appears well-developed and well-nourished. No distress.  HENT:  Head: Normocephalic and atraumatic.  Right Ear: External ear normal.  Mouth/Throat: No oropharyngeal exudate.  Eyes: Conjunctivae and EOM are normal. Pupils are equal, round, and reactive to light. Right eye exhibits no discharge. Left eye exhibits no discharge. No scleral icterus.  Neck: Normal range of motion. Neck supple. No JVD present.  No tracheal deviation present. No thyromegaly present.  Cardiovascular: Normal rate, regular rhythm, normal heart sounds and intact distal pulses.   No murmur heard. Pulmonary/Chest: Effort normal and breath sounds normal. No respiratory distress. She has no wheezes. She has no rales. She exhibits no tenderness.  Abdominal: Soft. Bowel sounds are normal. She exhibits no distension and no mass. There is no tenderness.  Musculoskeletal: Normal range of motion. She exhibits tenderness. She exhibits no edema.  Right hip pain with ROM.  Neurological: She is alert and oriented to person, place, and time. She has normal reflexes. No cranial nerve deficit. She exhibits normal muscle tone. Coordination normal.  Skin: Skin is dry. No rash noted. She is not diaphoretic. No pallor.  Right hip surgical incision healed.   Psychiatric: Her behavior is normal. Judgment and thought content normal. Her mood appears anxious. Her speech is not rapid and/or pressured, not delayed, not tangential and not slurred. Cognition and memory  are impaired. She exhibits a depressed mood. She is communicative. She exhibits abnormal recent memory. She exhibits normal remote memory.    Labs reviewed: Basic Metabolic Panel:  Recent Labs  07/06/14 1726  04/07/15 1635 04/08/15 0733 04/09/15 0500  NA 140  < > 139 138 138  K 4.3  < > 4.2 3.8 3.2*  CL 105  --  104 98* 102  CO2 26  --  25 27 25   GLUCOSE 97  --  102* 146* 91  BUN 20  < > 17 12 19   CREATININE 0.64  < > 0.75 0.76 0.74  CALCIUM 8.9  --  8.9 8.9 8.5*  MG 1.9  --   --   --   --   < > = values in this interval not displayed.  Liver Function Tests:  Recent Labs  07/06/14 1726  09/28/14 04/07/15 1635 04/09/15 0500  AST 11  < > 12* 16 13*  ALT 8  < > 8 9* 8*  ALKPHOS 68  < > 55 81 69  BILITOT 0.3  --   --  0.7 0.5  PROT 5.7*  --   --  6.2* 5.3*  ALBUMIN 3.6  --   --  3.0* 2.8*  < > = values in this interval not displayed.  CBC:  Recent Labs  07/06/14 1726  04/07/15 1635 04/08/15 0733 04/09/15 0500  WBC 7.1  < > 8.5 13.7* 9.7  NEUTROABS 4.3  --  5.7  --   --   HGB 12.2  < > 12.0 13.1 11.1*  HCT 38.2  < > 38.1 41.1 35.2*  MCV 87.8  --  91.6 90.9 90.3  PLT 348  < > 378 436* 371  < > = values in this interval not displayed.  Lab Results  Component Value Date   TSH 3.413 04/08/2015   Lab Results  Component Value Date   HGBA1C 5.8* 07/06/2014   Lab Results  Component Value Date   CHOL 209* 07/06/2014   HDL 66 07/06/2014   LDLCALC 111* 07/06/2014   TRIG 160* 07/06/2014   CHOLHDL 3.2 07/06/2014    Significant Diagnostic Results since last visit: none  Patient Care Team: Unk Pinto, MD as PCP - General (Internal Medicine) Hurman Horn, MD as Consulting Physician (Ophthalmology) Burnell Blanks, MD as Consulting Physician (Cardiology) Inda Castle, MD as Consulting Physician (Gastroenterology) Jari Pigg, MD as Consulting Physician (Dermatology) Dorna Leitz, MD as Consulting Physician (Orthopedic  Surgery)  Assessment/Plan Problem List Items Addressed This  Visit    UTI (urinary tract infection) - Primary    Escherichia coli UTI-initially started on IV Rocephin, now switched to Levaquin, 10 days       Pleural effusion, left    Exudative left pleural effusion-status post removal of 850 ml of pleural fluid Please follow up on the results of pleural fluid culture and cytology       Hypothyroidism (Chronic)    Last TSH 2.188 09/2014, continue Levothyroxine 140mcg po daily      Hypokalemia    Taking Kcl 83meq daily      GERD (Chronic)    Stable, continue Famotidine 20mg  daily.       Essential hypertension (Chronic)    Controlled, continue Labetalol 100mg  bid.      Dementia of the Alzheimer's type (Chronic)    05/19/14 MMSE 18/30, continue Namenda, SNF for care needs      Constipation    Stable, continue Senokot S II nightly      Chest pain    Chest pain likely musculoskeletal vs secondary to left-sided pleural effusion which appears to be exudative , cardiac enzymes negative, CT scan shows moderate to large left-sided pleural effusion that would need further evaluation , status post thoracentesis No pneumonia on follow-up chest x-ray No PE in the central pulmonary artery, 2-D echo within normal limitsnormal, no wall motion abnormalities, EF 0000000, grade 1 diastolic dysfunction Chest pain likely secondary to exudative pleural effusion Patient to be treated with an extended course of antibiotics , Levaquin 10 days          Family/ staff Communication: continue SNF for care needs  Labs/tests ordered: none  [facility] Tyqwan Pink NP [facility] [address], [place] [address] On Call:  [phone] & follow prompts after 5pm & weekends Office Phone:  [phone] Office Fax:  [phone]

## 2015-04-11 ENCOUNTER — Encounter: Payer: Self-pay | Admitting: Nurse Practitioner

## 2015-04-11 LAB — PH, BODY FLUID: pH, Body Fluid: 7.7

## 2015-04-11 LAB — PATHOLOGIST SMEAR REVIEW

## 2015-04-11 NOTE — Assessment & Plan Note (Signed)
Escherichia coli UTI-initially started on IV Rocephin, now switched to Levaquin, 10 days

## 2015-04-11 NOTE — Assessment & Plan Note (Signed)
Stable, continue Famotidine 20mg daily.   

## 2015-04-11 NOTE — Assessment & Plan Note (Signed)
Controlled, continue Labetalol 100mg bid 

## 2015-04-11 NOTE — Assessment & Plan Note (Signed)
Exudative left pleural effusion-status post removal of 850 ml of pleural fluid Please follow up on the results of pleural fluid culture and cytology

## 2015-04-11 NOTE — Assessment & Plan Note (Signed)
05/19/14 MMSE 18/30, continue Namenda, SNF for care needs 

## 2015-04-11 NOTE — Assessment & Plan Note (Signed)
Taking Kcl 1meq daily

## 2015-04-11 NOTE — Assessment & Plan Note (Signed)
Chest pain likely musculoskeletal vs secondary to left-sided pleural effusion which appears to be exudative , cardiac enzymes negative, CT scan shows moderate to large left-sided pleural effusion that would need further evaluation , status post thoracentesis No pneumonia on follow-up chest x-ray No PE in the central pulmonary artery, 2-D echo within normal limitsnormal, no wall motion abnormalities, EF 0000000, grade 1 diastolic dysfunction Chest pain likely secondary to exudative pleural effusion Patient to be treated with an extended course of antibiotics , Levaquin 10 days

## 2015-04-11 NOTE — Assessment & Plan Note (Signed)
Stable, continue Senokot S II nightly.  

## 2015-04-11 NOTE — Assessment & Plan Note (Signed)
Last TSH 2.188 09/2014, continue Levothyroxine 100mcg po daily. 

## 2015-04-12 ENCOUNTER — Ambulatory Visit: Payer: Self-pay | Admitting: Internal Medicine

## 2015-04-14 LAB — CULTURE, BODY FLUID W GRAM STAIN -BOTTLE: Culture: NO GROWTH

## 2015-04-14 LAB — CULTURE, BODY FLUID-BOTTLE

## 2015-04-16 DIAGNOSIS — R079 Chest pain, unspecified: Secondary | ICD-10-CM | POA: Diagnosis not present

## 2015-04-17 DIAGNOSIS — E222 Syndrome of inappropriate secretion of antidiuretic hormone: Secondary | ICD-10-CM | POA: Diagnosis not present

## 2015-04-17 LAB — BASIC METABOLIC PANEL
BUN: 16 mg/dL (ref 4–21)
Creatinine: 0.8 mg/dL (ref 0.5–1.1)
Glucose: 82 mg/dL
Potassium: 3.7 mmol/L (ref 3.4–5.3)
SODIUM: 138 mmol/L (ref 137–147)

## 2015-04-17 LAB — HEPATIC FUNCTION PANEL
ALK PHOS: 63 U/L (ref 25–125)
ALT: 9 U/L (ref 7–35)
AST: 15 U/L (ref 13–35)
BILIRUBIN, TOTAL: 0.4 mg/dL

## 2015-04-17 LAB — CBC AND DIFFERENTIAL
HEMATOCRIT: 34 % — AB (ref 36–46)
HEMOGLOBIN: 11.2 g/dL — AB (ref 12.0–16.0)
PLATELETS: 338 10*3/uL (ref 150–399)
WBC: 7.7 10^3/mL

## 2015-04-18 ENCOUNTER — Other Ambulatory Visit: Payer: Self-pay | Admitting: Nurse Practitioner

## 2015-04-18 DIAGNOSIS — D649 Anemia, unspecified: Secondary | ICD-10-CM

## 2015-04-18 DIAGNOSIS — E876 Hypokalemia: Secondary | ICD-10-CM

## 2015-05-02 ENCOUNTER — Non-Acute Institutional Stay (SKILLED_NURSING_FACILITY): Payer: Medicare Other | Admitting: Nurse Practitioner

## 2015-05-02 ENCOUNTER — Encounter: Payer: Self-pay | Admitting: Nurse Practitioner

## 2015-05-02 DIAGNOSIS — E039 Hypothyroidism, unspecified: Secondary | ICD-10-CM

## 2015-05-02 DIAGNOSIS — I5033 Acute on chronic diastolic (congestive) heart failure: Secondary | ICD-10-CM | POA: Diagnosis not present

## 2015-05-02 DIAGNOSIS — I10 Essential (primary) hypertension: Secondary | ICD-10-CM | POA: Diagnosis not present

## 2015-05-02 DIAGNOSIS — K219 Gastro-esophageal reflux disease without esophagitis: Secondary | ICD-10-CM

## 2015-05-02 DIAGNOSIS — J948 Other specified pleural conditions: Secondary | ICD-10-CM | POA: Diagnosis not present

## 2015-05-02 DIAGNOSIS — D649 Anemia, unspecified: Secondary | ICD-10-CM

## 2015-05-02 DIAGNOSIS — K5900 Constipation, unspecified: Secondary | ICD-10-CM | POA: Diagnosis not present

## 2015-05-02 DIAGNOSIS — N390 Urinary tract infection, site not specified: Secondary | ICD-10-CM

## 2015-05-02 DIAGNOSIS — G309 Alzheimer's disease, unspecified: Secondary | ICD-10-CM

## 2015-05-02 DIAGNOSIS — F0280 Dementia in other diseases classified elsewhere without behavioral disturbance: Secondary | ICD-10-CM

## 2015-05-02 DIAGNOSIS — J90 Pleural effusion, not elsewhere classified: Secondary | ICD-10-CM

## 2015-05-02 NOTE — Assessment & Plan Note (Signed)
Controlled, continue Labetalol 100mg bid 

## 2015-05-02 NOTE — Assessment & Plan Note (Signed)
03/27/15 Hgb 11.5 04/17/15 Hgb 11.2

## 2015-05-02 NOTE — Assessment & Plan Note (Signed)
Stable, continue Senokot S II nightly.  

## 2015-05-02 NOTE — Assessment & Plan Note (Signed)
Clinically compensated.

## 2015-05-02 NOTE — Progress Notes (Signed)
Patient ID: Crystal Petersen, female   DOB: 10-06-1921, 82 y.o.   MRN: [id number]  Location:  SNF [facility] Provider:  Marlana Latus NP  Code Status:  DNR Goals of care: Advanced Directive information    Chief Complaint  Patient presents with  . Medical Management of Chronic Issues     HPI: Patient is a 82 y.o. female seen in the SNF at [facility] today for evaluation of Hx of GERD symptomatic, last TSH wnl 09/2014 while on Levothyroxine 184mcg, controlled blood pressure while taking Labetalol 100mg  bid, taking Namenda for memory loss, she is functioning adequately in SNF. Denied pain, constipation, or trouble sleeping at night.     04/07/15-04/09/15 hospitalized for chest pain,  ABD and low back pain. She was found to have UTI, and was placed on Keflex. A Chest X-ray revealed a Bilateral Pleuarl Effusions, with the left greater than right. The initial cardiac workup has been negative.   Review of Systems  Constitutional: Negative for fever and chills.  HENT: Positive for hearing loss. Negative for congestion, ear discharge and ear pain.   Eyes: Negative for pain, discharge and redness.  Respiratory: Negative for cough, shortness of breath and wheezing.   Cardiovascular: Negative for chest pain and leg swelling.  Gastrointestinal: Negative for nausea, vomiting, abdominal pain and constipation.  Genitourinary: Positive for frequency. Negative for dysuria and urgency.  Musculoskeletal: Positive for back pain. Negative for myalgias and neck pain.  Skin: Negative for rash.  Neurological: Negative for dizziness, tremors, seizures, weakness and headaches.  Psychiatric/Behavioral: Positive for memory loss. Negative for suicidal ideas and hallucinations. The patient is nervous/anxious.     Past Medical History  Diagnosis Date  . Hypertension   . Nonspecific abnormal electrocardiogram (ECG) (EKG)   . Stricture and stenosis of esophagus   . Esophageal reflux   . Diverticulosis of  colon (without mention of hemorrhage)   . Benign neoplasm of colon   . Macular degeneration   . Osteoarthritis     right hip  . Other and unspecified hyperlipidemia   . HTN (hypertension)   . Shingles   . Cystocele   . Gastroparesis   . Skin cancer (melanoma) ([place])   . Hypothyroidism   . Cancer ([place])     pre-melanoma  . Hemorrhoids   . Primary osteoarthritis of right hip 03/27/2014  . Acute blood loss anemia 03/29/2014    03/30/14 Hgb 7.3 04/04/14 Hgb 8.5 continue Fe bid.  05/04/14 Hgb 10.8   . Dementia of the Alzheimer's type 05/22/2014    05/19/14 MMSE 18/30 Namenda.    . Diastolic dysfunction [id number]  . Diverticulosis of large intestine 06/28/2004  . Essential hypertension 07/05/2013  . Hyperlipidemia 07/05/2013  . Internal hemorrhoids without mention of complication [id number]  . Prediabetes 11/17/2013  . Stricture and stenosis of esophagus 10/27/2007  . Vitamin D deficiency 11/17/2013    Patient Active Problem List   Diagnosis Date Noted  . Chest pain 04/07/2015  . Pleural effusion, left 04/07/2015  . Diastolic CHF, acute on chronic (HCC) 04/07/2015  . UTI (urinary tract infection) 04/07/2015  . Anal prolapse 03/23/2015  . Anemia 03/23/2015  . Hypokalemia 03/15/2015  . Constipation 07/19/2014  . Dementia of the Alzheimer's type 05/22/2014  . Macular degeneration 05/22/2014  . Cystocele 05/22/2014  . Melanoma of skin ([place]) 05/22/2014  . Unstable gait 05/22/2014  . Nodule of left lung 05/22/2014  . Adenomatous colon polyp 05/22/2014  . Osteoporosis 05/22/2014  . History of herpes zoster  05/22/2014  . Gastroparesis 05/22/2014  . Deaf 05/22/2014  . Periprosthetic fracture around internal prosthetic right hip joint ([place]) 04/12/2014  . Primary osteoarthritis of right hip 03/27/2014  . Prediabetes 11/17/2013  . Vitamin D deficiency 11/17/2013  . Hyperlipidemia 07/05/2013  . Hypothyroidism 07/05/2013  . Essential hypertension 07/05/2013  . Diastolic dysfunction 123456    . Stricture and stenosis of esophagus 10/27/2007  . GERD 10/25/2007  . Diverticulosis of large intestine 06/28/2004    Allergies  Allergen Reactions  . Ace Inhibitors Other (See Comments)    Elevated potassium  . Statins Other (See Comments)    Myalgias   . Vicodin [Hydrocodone-Acetaminophen] Other (See Comments)    "felt spaced out"  . Fosamax [Alendronate Sodium] Other (See Comments)  . Metoclopramide Hcl Other (See Comments)  . Sulfa Antibiotics Rash  . Sulfonamide Derivatives Rash  . Vesicare [Solifenacin] Other (See Comments)    Medications: Patient's Medications  New Prescriptions   No medications on file  Previous Medications   ACETAMINOPHEN (TYLENOL) 325 MG TABLET    Take 2 tablets (650 mg total) by mouth every 6 (six) hours as needed.   ASPIRIN EC 81 MG TABLET    Take 81 mg by mouth daily.   CALCIUM CARBONATE (CAL-GEST ANTACID) 500 MG CHEWABLE TABLET    Chew 1 tablet by mouth daily as needed for indigestion or heartburn.   CHOLECALCIFEROL (VITAMIN D) 2000 UNITS TABLET    Take 4,000 Units by mouth daily.   DOCUSATE SODIUM (COLACE) 100 MG CAPSULE    Take 100 mg by mouth 2 (two) times daily as needed for mild constipation.    FAMOTIDINE (PEPCID) 20 MG TABLET    Take 20 mg by mouth at bedtime.   GUAIFENESIN (ROBITUSSIN) 100 MG/5ML SOLN    Take 10 mLs by mouth every 6 (six) hours as needed for cough or to loosen phlegm.   LABETALOL (NORMODYNE) 200 MG TABLET    Take 1 tablet (200 mg total) by mouth 2 (two) times daily.   LEVOFLOXACIN (LEVAQUIN) 500 MG TABLET    Take 1 tablet (500 mg total) by mouth daily.   LEVOTHYROXINE (SYNTHROID, LEVOTHROID) 100 MCG TABLET    Take 100 mcg by mouth daily before breakfast.   MEMANTINE (NAMENDA XR) 28 MG CP24 24 HR CAPSULE    Take 28 mg by mouth daily.   MULTIPLE VITAMINS-MINERALS (PRESERVISION AREDS 2) CAPS    Take 1 capsule by mouth 2 (two) times daily.   POTASSIUM CHLORIDE (K-DUR,KLOR-CON) 10 MEQ TABLET    Take 4 tablets (40 mEq total)  by mouth daily.   SENNOSIDES-DOCUSATE SODIUM (SENOKOT-S) 8.6-50 MG TABLET    Take 2 tablets by mouth at bedtime. One at bedtime  Modified Medications   No medications on file  Discontinued Medications   No medications on file    Physical Exam: Filed Vitals:   05/02/15 1132  BP: 166/72  Pulse: 68  Temp: 98.2 F (36.8 C)  TempSrc: Tympanic  Resp: 20   There is no weight on file to calculate BMI.  Physical Exam  Constitutional: She is oriented to person, place, and time. She appears well-developed and well-nourished. No distress.  HENT:  Head: Normocephalic and atraumatic.  Right Ear: External ear normal.  Mouth/Throat: No oropharyngeal exudate.  Eyes: Conjunctivae and EOM are normal. Pupils are equal, round, and reactive to light. Right eye exhibits no discharge. Left eye exhibits no discharge. No scleral icterus.  Neck: Normal range of motion. Neck supple. No JVD present.  No tracheal deviation present. No thyromegaly present.  Cardiovascular: Normal rate, regular rhythm, normal heart sounds and intact distal pulses.   No murmur heard. Pulmonary/Chest: Effort normal and breath sounds normal. No respiratory distress. She has no wheezes. She has no rales. She exhibits no tenderness.  Abdominal: Soft. Bowel sounds are normal. She exhibits no distension and no mass. There is no tenderness.  Musculoskeletal: Normal range of motion. She exhibits tenderness. She exhibits no edema.  Right hip pain with ROM.  Neurological: She is alert and oriented to person, place, and time. She has normal reflexes. No cranial nerve deficit. She exhibits normal muscle tone. Coordination normal.  Skin: Skin is dry. No rash noted. She is not diaphoretic. No pallor.  Right hip surgical incision healed.   Psychiatric: Her behavior is normal. Judgment and thought content normal. Her mood appears anxious. Her speech is not rapid and/or pressured, not delayed, not tangential and not slurred. Cognition and memory  are impaired. She exhibits a depressed mood. She is communicative. She exhibits abnormal recent memory. She exhibits normal remote memory.    Labs reviewed: Basic Metabolic Panel:  Recent Labs  07/06/14 1726  04/07/15 1635 04/08/15 0733 04/09/15 0500 04/17/15  NA 140  < > 139 138 138 138  K 4.3  < > 4.2 3.8 3.2* 3.7  CL 105  --  104 98* 102  --   CO2 26  --  25 27 25   --   GLUCOSE 97  --  102* 146* 91  --   BUN 20  < > 17 12 19 16   CREATININE 0.64  < > 0.75 0.76 0.74 0.8  CALCIUM 8.9  --  8.9 8.9 8.5*  --   MG 1.9  --   --   --   --   --   < > = values in this interval not displayed.  Liver Function Tests:  Recent Labs  07/06/14 1726  04/07/15 1635 04/09/15 0500 04/17/15  AST 11  < > 16 13* 15  ALT 8  < > 9* 8* 9  ALKPHOS 68  < > 81 69 63  BILITOT 0.3  --  0.7 0.5  --   PROT 5.7*  --  6.2* 5.3*  --   ALBUMIN 3.6  --  3.0* 2.8*  --   < > = values in this interval not displayed.  CBC:  Recent Labs  07/06/14 1726  04/07/15 1635 04/08/15 0733 04/09/15 0500 04/17/15  WBC 7.1  < > 8.5 13.7* 9.7 7.7  NEUTROABS 4.3  --  5.7  --   --   --   HGB 12.2  < > 12.0 13.1 11.1* 11.2*  HCT 38.2  < > 38.1 41.1 35.2* 34*  MCV 87.8  --  91.6 90.9 90.3  --   PLT 348  < > 378 436* 371 338  < > = values in this interval not displayed.  Lab Results  Component Value Date   TSH 3.413 04/08/2015   Lab Results  Component Value Date   HGBA1C 5.8* 07/06/2014   Lab Results  Component Value Date   CHOL 209* 07/06/2014   HDL 66 07/06/2014   LDLCALC 111* 07/06/2014   TRIG 160* 07/06/2014   CHOLHDL 3.2 07/06/2014    Significant Diagnostic Results since last visit: none  Patient Care Team: Unk Pinto, MD as PCP - General (Internal Medicine) Hurman Horn, MD as Consulting Physician (Ophthalmology) Burnell Blanks, MD as Consulting Physician (Cardiology)  Inda Castle, MD as Consulting Physician (Gastroenterology) Jari Pigg, MD as Consulting Physician  (Dermatology) Dorna Leitz, MD as Consulting Physician (Orthopedic Surgery)  Assessment/Plan Problem List Items Addressed This Visit    UTI (urinary tract infection) - Primary    Fully treated Escherichia coli UTI-initially started on IV Rocephin and Levaquin, 10 days      Pleural effusion, left    Exudative left pleural effusion-status post removal of 850 ml of pleural fluid Inflammation pleural fluid culture and cytology 04/16/15 CXR left basilar opacity likely representing infectious process, chronic chest findings are present.        Hypothyroidism (Chronic)    Last TSH 2.188 09/2014, continue Levothyroxine 147mcg po daily      GERD (Chronic)    Stable, continue Famotidine 20mg  daily.       Essential hypertension (Chronic)    Controlled, continue Labetalol 100mg  bid.      Diastolic CHF, acute on chronic (HCC)    Clinically compensated.       Dementia of the Alzheimer's type (Chronic)    05/19/14 MMSE 18/30, continue Namenda, SNF for care needs      Constipation    Stable, continue Senokot S II nightly      Anemia (Chronic)    03/27/15 Hgb 11.5 04/17/15 Hgb 11.2          Family/ staff Communication: continue SNF for care needs  Labs/tests ordered: none  [facility] Allisson Schindel NP Geriatric[facility] [address], [place] [address] On Call:  [phone] & follow prompts after 5pm & weekends Office Phone:  [phone] Office Fax:  [phone]

## 2015-05-02 NOTE — Assessment & Plan Note (Signed)
Exudative left pleural effusion-status post removal of 850 ml of pleural fluid Inflammation pleural fluid culture and cytology 04/16/15 CXR left basilar opacity likely representing infectious process, chronic chest findings are present.

## 2015-05-02 NOTE — Assessment & Plan Note (Signed)
Last TSH 2.188 09/2014, continue Levothyroxine 100mcg po daily. 

## 2015-05-02 NOTE — Assessment & Plan Note (Signed)
05/19/14 MMSE 18/30, continue Namenda, SNF for care needs 

## 2015-05-02 NOTE — Assessment & Plan Note (Signed)
Fully treated Escherichia coli UTI-initially started on IV Rocephin and Levaquin, 10 days

## 2015-05-02 NOTE — Assessment & Plan Note (Signed)
Stable, continue Famotidine 20mg daily.   

## 2015-05-08 DIAGNOSIS — H353221 Exudative age-related macular degeneration, left eye, with active choroidal neovascularization: Secondary | ICD-10-CM | POA: Diagnosis not present

## 2015-05-28 ENCOUNTER — Encounter: Payer: Self-pay | Admitting: Nurse Practitioner

## 2015-05-28 ENCOUNTER — Non-Acute Institutional Stay (SKILLED_NURSING_FACILITY): Payer: Medicare Other | Admitting: Nurse Practitioner

## 2015-05-28 DIAGNOSIS — E876 Hypokalemia: Secondary | ICD-10-CM

## 2015-05-28 DIAGNOSIS — K5900 Constipation, unspecified: Secondary | ICD-10-CM

## 2015-05-28 DIAGNOSIS — D649 Anemia, unspecified: Secondary | ICD-10-CM

## 2015-05-28 DIAGNOSIS — F0280 Dementia in other diseases classified elsewhere without behavioral disturbance: Secondary | ICD-10-CM

## 2015-05-28 DIAGNOSIS — E039 Hypothyroidism, unspecified: Secondary | ICD-10-CM | POA: Diagnosis not present

## 2015-05-28 DIAGNOSIS — I10 Essential (primary) hypertension: Secondary | ICD-10-CM

## 2015-05-28 DIAGNOSIS — G309 Alzheimer's disease, unspecified: Secondary | ICD-10-CM

## 2015-05-28 DIAGNOSIS — J90 Pleural effusion, not elsewhere classified: Secondary | ICD-10-CM

## 2015-05-28 DIAGNOSIS — K219 Gastro-esophageal reflux disease without esophagitis: Secondary | ICD-10-CM | POA: Diagnosis not present

## 2015-05-28 DIAGNOSIS — J948 Other specified pleural conditions: Secondary | ICD-10-CM | POA: Diagnosis not present

## 2015-05-28 DIAGNOSIS — I5033 Acute on chronic diastolic (congestive) heart failure: Secondary | ICD-10-CM

## 2015-05-28 NOTE — Assessment & Plan Note (Signed)
Stable, continue Senokot S II nightly.  

## 2015-05-28 NOTE — Assessment & Plan Note (Signed)
Taking Kcl 73meq daily, 04/17/15 K 3.7

## 2015-05-28 NOTE — Assessment & Plan Note (Signed)
03/27/15 Hgb 11.5 04/17/15 Hgb 11.2

## 2015-05-28 NOTE — Assessment & Plan Note (Signed)
Last TSH 2.188 09/2014, continue Levothyroxine 133mcg po daily, update TSH

## 2015-05-28 NOTE — Assessment & Plan Note (Signed)
05/19/14 MMSE 18/30, continue Namenda, SNF for care needs 

## 2015-05-28 NOTE — Assessment & Plan Note (Signed)
Exudative left pleural effusion-status post removal of 850 ml of pleural fluid Inflammation pleural fluid culture and cytology 04/16/15 CXR left basilar opacity likely representing infectious process, chronic chest findings are present.

## 2015-05-28 NOTE — Progress Notes (Signed)
Patient ID: Crystal Petersen, female   DOB: [birth year], 82 y.o.   MRN: [id number]  Location:  SNF [facility] Provider:  Marlana Latus NP  Code Status:  DNR Goals of care: Advanced Directive information Does patient have an advance directive?: Yes, Type of Advance Directive: Out of facility DNR (pink MOST or yellow form)  Chief Complaint  Patient presents with  . Medical Management of Chronic Issues    Routine Visit     HPI: Patient is a 82 y.o. female seen in the SNF at [facility] today for evaluation of Hx of GERD symptomatic, last TSH wnl 09/2014 while on Levothyroxine 160mcg, controlled blood pressure while taking Labetalol 100mg  bid, taking Namenda for memory loss, she is functioning adequately in SNF. Denied pain, constipation, or trouble sleeping at night.     04/07/15-04/09/15 hospitalized for chest pain,  ABD and low back pain. She was found to have UTI, and was placed on Keflex. A Chest X-ray revealed a Bilateral Pleuarl Effusions, with the left greater than right. The initial cardiac workup has been negative.   Review of Systems  Constitutional: Negative for fever and chills.  HENT: Positive for hearing loss. Negative for congestion, ear discharge and ear pain.   Eyes: Negative for pain, discharge and redness.  Respiratory: Negative for cough, shortness of breath and wheezing.   Cardiovascular: Negative for chest pain and leg swelling.  Gastrointestinal: Negative for nausea, vomiting, abdominal pain and constipation.  Genitourinary: Positive for frequency. Negative for dysuria and urgency.  Musculoskeletal: Positive for back pain. Negative for myalgias and neck pain.  Skin: Negative for rash.  Neurological: Negative for dizziness, tremors, seizures, weakness and headaches.  Psychiatric/Behavioral: Positive for memory loss. Negative for suicidal ideas and hallucinations. The patient is nervous/anxious.     Past Medical History  Diagnosis Date  . Hypertension   .  Nonspecific abnormal electrocardiogram (ECG) (EKG)   . Stricture and stenosis of esophagus   . Esophageal reflux   . Diverticulosis of colon (without mention of hemorrhage)   . Benign neoplasm of colon   . Macular degeneration   . Osteoarthritis     right hip  . Other and unspecified hyperlipidemia   . HTN (hypertension)   . Shingles   . Cystocele   . Gastroparesis   . Skin cancer (melanoma) ([place])   . Hypothyroidism   . Cancer ([place])     pre-melanoma  . Hemorrhoids   . Primary osteoarthritis of right hip 03/27/2014  . Acute blood loss anemia 03/29/2014    03/30/14 Hgb 7.3 04/04/14 Hgb 8.5 continue Fe bid.  05/04/14 Hgb 10.8   . Dementia of the Alzheimer's type 05/22/2014    05/19/14 MMSE 18/30 Namenda.    . Diastolic dysfunction [id number]  . Diverticulosis of large intestine 06/28/2004  . Essential hypertension 07/05/2013  . Hyperlipidemia 07/05/2013  . Internal hemorrhoids without mention of complication [id number]  . Prediabetes 11/17/2013  . Stricture and stenosis of esophagus 10/27/2007  . Vitamin D deficiency 11/17/2013    Patient Active Problem List   Diagnosis Date Noted  . Chest pain 04/07/2015  . Pleural effusion, left 04/07/2015  . Diastolic CHF, acute on chronic (HCC) 04/07/2015  . UTI (urinary tract infection) 04/07/2015  . Anal prolapse 03/23/2015  . Anemia 03/23/2015  . Hypokalemia 03/15/2015  . Constipation 07/19/2014  . Dementia of the Alzheimer's type 05/22/2014  . Macular degeneration 05/22/2014  . Cystocele 05/22/2014  . Melanoma of skin ([place]) 05/22/2014  . Unstable gait 05/22/2014  .  Nodule of left lung 05/22/2014  . Adenomatous colon polyp 05/22/2014  . Osteoporosis 05/22/2014  . History of herpes zoster 05/22/2014  . Gastroparesis 05/22/2014  . Deaf 05/22/2014  . Periprosthetic fracture around internal prosthetic right hip joint ([place]) 04/12/2014  . Primary osteoarthritis of right hip 03/27/2014  . Prediabetes 11/17/2013  . Vitamin D deficiency  11/17/2013  . Hyperlipidemia 07/05/2013  . Hypothyroidism 07/05/2013  . Essential hypertension 07/05/2013  . Diastolic dysfunction [id number]  . Stricture and stenosis of esophagus 10/27/2007  . GERD 10/25/2007  . Diverticulosis of large intestine 06/28/2004    Allergies  Allergen Reactions  . Ace Inhibitors Other (See Comments)    Elevated potassium  . Statins Other (See Comments)    Myalgias   . Vicodin [Hydrocodone-Acetaminophen] Other (See Comments)    "felt spaced out"  . Fosamax [Alendronate Sodium] Other (See Comments)  . Metoclopramide Hcl Other (See Comments)  . Sulfa Antibiotics Rash  . Sulfonamide Derivatives Rash  . Vesicare [Solifenacin] Other (See Comments)    Medications: Patient's Medications  New Prescriptions   No medications on file  Previous Medications   ACETAMINOPHEN (TYLENOL) 500 MG TABLET    Take 1,000 mg by mouth 3 (three) times daily as needed for mild pain or moderate pain.   ASPIRIN EC 81 MG TABLET    Take 81 mg by mouth daily.   CALCIUM CARBONATE (CAL-GEST ANTACID) 500 MG CHEWABLE TABLET    Chew 1 tablet by mouth daily as needed for indigestion or heartburn.   CHOLECALCIFEROL (VITAMIN D) 2000 UNITS TABLET    Take 4,000 Units by mouth daily.   DOCUSATE SODIUM (COLACE) 100 MG CAPSULE    Take 100 mg by mouth 2 (two) times daily as needed for mild constipation.    FAMOTIDINE (PEPCID) 20 MG TABLET    Take 20 mg by mouth at bedtime.   GUAIFENESIN (ROBITUSSIN) 100 MG/5ML SOLN    Take 10 mLs by mouth every 6 (six) hours as needed for cough or to loosen phlegm.   LABETALOL (NORMODYNE) 100 MG TABLET    Take 100 mg by mouth 2 (two) times daily.   LEVOTHYROXINE (SYNTHROID, LEVOTHROID) 100 MCG TABLET    Take 100 mcg by mouth daily before breakfast.   MEMANTINE (NAMENDA XR) 28 MG CP24 24 HR CAPSULE    Take 28 mg by mouth daily.   MULTIPLE VITAMINS-MINERALS (PRESERVISION AREDS 2) CAPS    Take 1 capsule by mouth 2 (two) times daily.   POTASSIUM CHLORIDE SA  (K-DUR,KLOR-CON) 20 MEQ TABLET    Take 40 mEq by mouth daily.   PROTEIN SUPPLEMENT (RESOURCE BENEPROTEIN) POWD    Take 1 scoop by mouth daily at 8 pm.   SENNOSIDES-DOCUSATE SODIUM (SENOKOT-S) 8.6-50 MG TABLET    Take 2 tablets by mouth at bedtime.   Modified Medications   No medications on file  Discontinued Medications   ACETAMINOPHEN (TYLENOL) 325 MG TABLET    Take 2 tablets (650 mg total) by mouth every 6 (six) hours as needed.   LABETALOL (NORMODYNE) 200 MG TABLET    Take 1 tablet (200 mg total) by mouth 2 (two) times daily.   LEVOFLOXACIN (LEVAQUIN) 500 MG TABLET    Take 1 tablet (500 mg total) by mouth daily.   POTASSIUM CHLORIDE (K-DUR,KLOR-CON) 10 MEQ TABLET    Take 4 tablets (40 mEq total) by mouth daily.    Physical Exam: Filed Vitals:   05/28/15 0907  BP: 139/62  Pulse: 76  Temp: 98.8 F (37.1  C)  TempSrc: Oral  Resp: 20  Height: 5\' 1"  (1.549 m)  Weight: 131 lb 1.6 oz (59.467 kg)   Body mass index is 24.78 kg/(m^2).  Physical Exam  Constitutional: She is oriented to person, place, and time. She appears well-developed and well-nourished. No distress.  HENT:  Head: Normocephalic and atraumatic.  Right Ear: External ear normal.  Mouth/Throat: No oropharyngeal exudate.  Eyes: Conjunctivae and EOM are normal. Pupils are equal, round, and reactive to light. Right eye exhibits no discharge. Left eye exhibits no discharge. No scleral icterus.  Neck: Normal range of motion. Neck supple. No JVD present. No tracheal deviation present. No thyromegaly present.  Cardiovascular: Normal rate, regular rhythm, normal heart sounds and intact distal pulses.   No murmur heard. Pulmonary/Chest: Effort normal and breath sounds normal. No respiratory distress. She has no wheezes. She has no rales. She exhibits no tenderness.  Abdominal: Soft. Bowel sounds are normal. She exhibits no distension and no mass. There is no tenderness.  Musculoskeletal: Normal range of motion. She exhibits  tenderness. She exhibits no edema.  Right hip pain with ROM.  Neurological: She is alert and oriented to person, place, and time. She has normal reflexes. No cranial nerve deficit. She exhibits normal muscle tone. Coordination normal.  Skin: Skin is dry. No rash noted. She is not diaphoretic. No pallor.  Right hip surgical incision healed.   Psychiatric: Her behavior is normal. Judgment and thought content normal. Her mood appears anxious. Her speech is not rapid and/or pressured, not delayed, not tangential and not slurred. Cognition and memory are impaired. She exhibits a depressed mood. She is communicative. She exhibits abnormal recent memory. She exhibits normal remote memory.    Labs reviewed: Basic Metabolic Panel:  Recent Labs  07/06/14 1726  04/07/15 1635 04/08/15 0733 04/09/15 0500 04/17/15  NA 140  < > 139 138 138 138  K 4.3  < > 4.2 3.8 3.2* 3.7  CL 105  --  104 98* 102  --   CO2 26  --  25 27 25   --   GLUCOSE 97  --  102* 146* 91  --   BUN 20  < > 17 12 19 16   CREATININE 0.64  < > 0.75 0.76 0.74 0.8  CALCIUM 8.9  --  8.9 8.9 8.5*  --   MG 1.9  --   --   --   --   --   < > = values in this interval not displayed.  Liver Function Tests:  Recent Labs  07/06/14 1726  04/07/15 1635 04/09/15 0500 04/17/15  AST 11  < > 16 13* 15  ALT 8  < > 9* 8* 9  ALKPHOS 68  < > 81 69 63  BILITOT 0.3  --  0.7 0.5  --   PROT 5.7*  --  6.2* 5.3*  --   ALBUMIN 3.6  --  3.0* 2.8*  --   < > = values in this interval not displayed.  CBC:  Recent Labs  07/06/14 1726  04/07/15 1635 04/08/15 0733 04/09/15 0500 04/17/15  WBC 7.1  < > 8.5 13.7* 9.7 7.7  NEUTROABS 4.3  --  5.7  --   --   --   HGB 12.2  < > 12.0 13.1 11.1* 11.2*  HCT 38.2  < > 38.1 41.1 35.2* 34*  MCV 87.8  --  91.6 90.9 90.3  --   PLT 348  < > 378 436* 371 338  < > =  values in this interval not displayed.  Lab Results  Component Value Date   TSH 3.413 04/08/2015   Lab Results  Component Value Date    HGBA1C 5.8* 07/06/2014   Lab Results  Component Value Date   CHOL 209* 07/06/2014   HDL 66 07/06/2014   LDLCALC 111* 07/06/2014   TRIG 160* 07/06/2014   CHOLHDL 3.2 07/06/2014    Significant Diagnostic Results since last visit: none  Patient Care Team: Unk Pinto, MD as PCP - General (Internal Medicine) Hurman Horn, MD as Consulting Physician (Ophthalmology) Burnell Blanks, MD as Consulting Physician (Cardiology) Inda Castle, MD as Consulting Physician (Gastroenterology) Jari Pigg, MD as Consulting Physician (Dermatology) Dorna Leitz, MD as Consulting Physician (Orthopedic Surgery)  Assessment/Plan Problem List Items Addressed This Visit    GERD - Primary (Chronic)    Stable, continue Famotidine 20mg  daily.       Hypothyroidism (Chronic)    Last TSH 2.188 09/2014, continue Levothyroxine 17mcg po daily, update TSH      Relevant Medications   labetalol (NORMODYNE) 100 MG tablet   Essential hypertension (Chronic)    Controlled, continue Labetalol 100mg  bid.      Relevant Medications   labetalol (NORMODYNE) 100 MG tablet   Dementia of the Alzheimer's type (Chronic)    05/19/14 MMSE 18/30, continue Namenda, SNF for care needs      Anemia (Chronic)    03/27/15 Hgb 11.5 04/17/15 Hgb 11.2      Constipation    Stable, continue Senokot S II nightly      Hypokalemia    Taking Kcl 76meq daily, 04/17/15 K 3.7      Pleural effusion, left    Exudative left pleural effusion-status post removal of 850 ml of pleural fluid Inflammation pleural fluid culture and cytology 04/16/15 CXR left basilar opacity likely representing infectious process, chronic chest findings are present.        Diastolic CHF, acute on chronic (HCC)    Clinically compensated.       Relevant Medications   labetalol (NORMODYNE) 100 MG tablet       Family/ staff Communication: continue SNF for care needs  Labs/tests ordered: TSH  [facility] Brent Noto NP [facility] [address], [place] [address] On Call:  [phone] & follow prompts after 5pm & weekends Office Phone:  [phone] Office Fax:  [phone]

## 2015-05-28 NOTE — Assessment & Plan Note (Signed)
Stable, continue Famotidine 20mg daily.   

## 2015-05-28 NOTE — Assessment & Plan Note (Signed)
Clinically compensated.

## 2015-05-28 NOTE — Assessment & Plan Note (Signed)
Controlled, continue Labetalol 100mg bid 

## 2015-05-29 DIAGNOSIS — E039 Hypothyroidism, unspecified: Secondary | ICD-10-CM | POA: Diagnosis not present

## 2015-05-29 LAB — TSH: TSH: 2.43 u[IU]/mL (ref 0.41–5.90)

## 2015-06-06 ENCOUNTER — Encounter: Payer: Self-pay | Admitting: Nurse Practitioner

## 2015-06-06 ENCOUNTER — Non-Acute Institutional Stay (SKILLED_NURSING_FACILITY): Payer: Medicare Other | Admitting: Nurse Practitioner

## 2015-06-06 DIAGNOSIS — D649 Anemia, unspecified: Secondary | ICD-10-CM

## 2015-06-06 DIAGNOSIS — G309 Alzheimer's disease, unspecified: Secondary | ICD-10-CM | POA: Diagnosis not present

## 2015-06-06 DIAGNOSIS — K219 Gastro-esophageal reflux disease without esophagitis: Secondary | ICD-10-CM

## 2015-06-06 DIAGNOSIS — H00013 Hordeolum externum right eye, unspecified eyelid: Secondary | ICD-10-CM

## 2015-06-06 DIAGNOSIS — E039 Hypothyroidism, unspecified: Secondary | ICD-10-CM | POA: Diagnosis not present

## 2015-06-06 DIAGNOSIS — F0280 Dementia in other diseases classified elsewhere without behavioral disturbance: Secondary | ICD-10-CM

## 2015-06-06 DIAGNOSIS — I10 Essential (primary) hypertension: Secondary | ICD-10-CM

## 2015-06-06 DIAGNOSIS — H00019 Hordeolum externum unspecified eye, unspecified eyelid: Secondary | ICD-10-CM | POA: Insufficient documentation

## 2015-06-06 DIAGNOSIS — J90 Pleural effusion, not elsewhere classified: Secondary | ICD-10-CM

## 2015-06-06 DIAGNOSIS — K5900 Constipation, unspecified: Secondary | ICD-10-CM

## 2015-06-06 DIAGNOSIS — I5033 Acute on chronic diastolic (congestive) heart failure: Secondary | ICD-10-CM | POA: Diagnosis not present

## 2015-06-06 DIAGNOSIS — J948 Other specified pleural conditions: Secondary | ICD-10-CM

## 2015-06-06 DIAGNOSIS — E876 Hypokalemia: Secondary | ICD-10-CM | POA: Diagnosis not present

## 2015-06-06 NOTE — Assessment & Plan Note (Signed)
Last TSH 2.188 09/2014, 05/29/15 TSH 2.43 continue Levothyroxine 182mcg po daily

## 2015-06-06 NOTE — Assessment & Plan Note (Signed)
lower eyelids redness, a sty seen right lower eyelids, mild ectropion noted, not injected, no discharge seen.

## 2015-06-06 NOTE — Assessment & Plan Note (Signed)
05/19/14 MMSE 18/30, continue Namenda, SNF for care needs 

## 2015-06-06 NOTE — Assessment & Plan Note (Signed)
Stable, continue Famotidine 20mg daily.   

## 2015-06-06 NOTE — Assessment & Plan Note (Signed)
Exudative left pleural effusion-status post removal of 850 ml of pleural fluid Inflammation pleural fluid culture and cytology 04/16/15 CXR left basilar opacity likely representing infectious process, chronic chest findings are present.

## 2015-06-06 NOTE — Progress Notes (Signed)
Patient ID: Crystal Petersen, female   DOB: January 25, 1922, 82 y.o.   MRN: [id number]  Location:  [facility] Room Number: 11 Place of Service:  SNF (31) Provider: Lennie Odor Tenise Stetler NP  Alesia Richards, MD  Patient Care Team: Unk Pinto, MD as PCP - General (Internal Medicine) Hurman Horn, MD as Consulting Physician (Ophthalmology) Burnell Blanks, MD as Consulting Physician (Cardiology) Inda Castle, MD as Consulting Physician (Gastroenterology) Jari Pigg, MD as Consulting Physician (Dermatology) Dorna Leitz, MD as Consulting Physician (Orthopedic Surgery)  Extended Emergency Contact Information Primary Emergency Contact: [facility] Address: [address]          [place], [place] [address] [place] of [place] Phone: [phone] Relation: Daughter Secondary Emergency Contact: Melisa, [place], [place] [address] [place] Phone: [phone] Relation: Son  Code Status: DNR Goals of care: Advanced Directive information Advanced Directives 06/06/2015  Does patient have an advance directive? Yes  Type of Advance Directive Out of facility DNR (pink MOST or yellow form)  Does patient want to make changes to advanced directive? No - Patient declined  Copy of advanced directive(s) in chart? Yes     Chief Complaint  Patient presents with  . Acute Visit    eye reddness    HPI:  Pt is a 82 y.o. female seen today for an acute visit for lower eyelids redness, a sty seen right lower eyelids, mild ectropion noted, not injected, no discharge seen.    Past Medical History  Diagnosis Date  . Hypertension   . Nonspecific abnormal electrocardiogram (ECG) (EKG)   . Stricture and stenosis of esophagus   . Esophageal reflux   . Diverticulosis of colon (without mention of hemorrhage)   . Benign neoplasm of colon   . Macular degeneration   . Osteoarthritis     right hip  . Other and unspecified hyperlipidemia     . HTN (hypertension)   . Shingles   . Cystocele   . Gastroparesis   . Skin cancer (melanoma) ([facility])   . Hypothyroidism   . Cancer ([facility])     pre-melanoma  . Hemorrhoids   . Primary osteoarthritis of right hip 03/27/2014  . Acute blood loss anemia 03/29/2014    03/30/14 Hgb 7.3 04/04/14 Hgb 8.5 continue Fe bid.  05/04/14 Hgb 10.8   . Dementia of the Alzheimer's type 05/22/2014    05/19/14 MMSE 18/30 Namenda.    . Diastolic dysfunction [id number]  . Diverticulosis of large intestine 06/28/2004  . Essential hypertension 07/05/2013  . Hyperlipidemia 07/05/2013  . Internal hemorrhoids without mention of complication [id number]  . Prediabetes 11/17/2013  . Stricture and stenosis of esophagus 10/27/2007  . Vitamin D deficiency 11/17/2013   Past Surgical History  Procedure Laterality Date  . Cholecystectomy    . Gallbladder surgery  2002  . Hemorrhoid surgery    . Appendectomy    . Thyroidectomy    . Left eye surgery    . Cataract extraction, bilateral    . Melenoma resection      right arm  . Bcc resection      legs/face  . Tonsillectomy    . Flexible sigmoidoscopy  05/12/2011    Procedure: FLEXIBLE SIGMOIDOSCOPY;  Surgeon: Inda Castle, MD;  Location: [facility];  Service: Endoscopy;  Laterality: N/A;  . Flexible sigmoidoscopy  09/10/2011    Procedure: FLEXIBLE SIGMOIDOSCOPY;  Surgeon: Inda Castle, MD;  Location:  [facility];  Service: Endoscopy;  Laterality: N/A;  . Fracture surgery      left hip pin  . Flexible sigmoidoscopy N/A 08/16/2012    Procedure: FLEXIBLE SIGMOIDOSCOPY;  Surgeon: Inda Castle, MD;  Location: [facility];  Service: Endoscopy;  Laterality: N/A;  . Hemorrhoid banding N/A 08/16/2012    Procedure: HEMORRHOID BANDING;  Surgeon: Inda Castle, MD;  Location: [facility];  Service: Endoscopy;  Laterality: N/A;  . Total hip arthroplasty Right 03/27/2014    Procedure: TOTAL HIP ARTHROPLASTY ANTERIOR APPROACH;  Surgeon: Alta Corning, MD;  Location: [facility];   Service: Orthopedics;  Laterality: Right;    Allergies  Allergen Reactions  . Ace Inhibitors Other (See Comments)    Elevated potassium  . Statins Other (See Comments)    Myalgias   . Vicodin [Hydrocodone-Acetaminophen] Other (See Comments)    "felt spaced out"  . Fosamax [Alendronate Sodium] Other (See Comments)  . Metoclopramide Hcl Other (See Comments)  . Sulfa Antibiotics Rash  . Sulfonamide Derivatives Rash  . Vesicare [Solifenacin] Other (See Comments)      Medication List       This list is accurate as of: 06/06/15 11:34 AM.  Always use your most recent med list.               acetaminophen 500 MG tablet  Commonly known as:  TYLENOL  Take 1,000 mg by mouth 3 (three) times daily as needed for mild pain or moderate pain.     aspirin EC 81 MG tablet  Take 81 mg by mouth daily.     CAL-GEST ANTACID 500 MG chewable tablet  Generic drug:  calcium carbonate  Chew 1 tablet by mouth daily as needed for indigestion or heartburn.     docusate sodium 100 MG capsule  Commonly known as:  COLACE  Take 100 mg by mouth 2 (two) times daily as needed for mild constipation.     famotidine 20 MG tablet  Commonly known as:  PEPCID  Take 20 mg by mouth at bedtime.     guaiFENesin 100 MG/5ML Soln  Commonly known as:  ROBITUSSIN  Take 10 mLs by mouth every 6 (six) hours as needed for cough or to loosen phlegm.     labetalol 100 MG tablet  Commonly known as:  NORMODYNE  Take 100 mg by mouth 2 (two) times daily.     levothyroxine 100 MCG tablet  Commonly known as:  SYNTHROID, LEVOTHROID  Take 100 mcg by mouth daily before breakfast.     memantine 28 MG Cp24 24 hr capsule  Commonly known as:  NAMENDA XR  Take 28 mg by mouth daily.     potassium chloride SA 20 MEQ tablet  Commonly known as:  K-DUR,KLOR-CON  Take 40 mEq by mouth daily.     PRESERVISION AREDS 2 Caps  Take 1 capsule by mouth 2 (two) times daily.     protein supplement Powd  Take 1 scoop by mouth daily at  8 pm.     sennosides-docusate sodium 8.6-50 MG tablet  Commonly known as:  SENOKOT-S  Take 2 tablets by mouth at bedtime.     Vitamin D 2000 units tablet  Take 4,000 Units by mouth daily.        Review of Systems  Constitutional: Negative for fever and chills.  HENT: Positive for hearing loss. Negative for congestion, ear discharge and ear pain.   Eyes: Negative for pain, discharge and redness.  Lower eyelids redness, a sty seen right lower eyelids, mild ectropion noted, not injected, no discharge seen.    Respiratory: Negative for cough, shortness of breath and wheezing.   Cardiovascular: Negative for chest pain and leg swelling.  Gastrointestinal: Negative for nausea, vomiting, abdominal pain and constipation.  Genitourinary: Positive for frequency. Negative for dysuria and urgency.  Musculoskeletal: Positive for back pain. Negative for myalgias and neck pain.  Skin: Negative for rash.  Neurological: Negative for dizziness, tremors, seizures, weakness and headaches.  Psychiatric/Behavioral: Negative for suicidal ideas and hallucinations. The patient is nervous/anxious.     Immunization History  Administered Date(s) Administered  . Influenza, High Dose Seasonal PF 12/27/2013  . Influenza-Unspecified 01/12/2013, 01/02/2015  . Pneumococcal-Unspecified 07/01/2012  . Td 05/27/2004  . Tdap 02/21/2012  . Zoster 04/15/2007   Pertinent  Health Maintenance Due  Topic Date Due  . PNA vac Low Risk Adult (2 of 2 - PCV13) 07/01/2013  . INFLUENZA VACCINE  11/13/2015  . DEXA SCAN  Completed   Fall Risk  05/22/2014 10/06/2013 06/02/2013  Falls in the past year? No No No  Risk for fall due to : - History of fall(s);Impaired balance/gait;Impaired mobility Impaired mobility   Functional Status Survey:    Filed Vitals:   06/06/15 1021  BP: 139/62  Pulse: 76  Temp: 98.8 F (37.1 C)  TempSrc: Oral  Resp: 20  Height: 5\' 1"  (1.549 m)  Weight: 131 lb 1.6 oz (59.467 kg)   Body  mass index is 24.78 kg/(m^2). Physical Exam  Constitutional: She is oriented to person, place, and time. She appears well-developed and well-nourished. No distress.  HENT:  Head: Normocephalic and atraumatic.  Right Ear: External ear normal.  Mouth/Throat: No oropharyngeal exudate.  Eyes: Conjunctivae and EOM are normal. Pupils are equal, round, and reactive to light. Right eye exhibits no discharge. Left eye exhibits no discharge. No scleral icterus.  lower eyelids redness, a sty seen right lower eyelids, mild ectropion noted, not injected, no discharge seen.    Neck: Normal range of motion. Neck supple. No JVD present. No tracheal deviation present. No thyromegaly present.  Cardiovascular: Normal rate, regular rhythm, normal heart sounds and intact distal pulses.   No murmur heard. Pulmonary/Chest: Effort normal and breath sounds normal. No respiratory distress. She has no wheezes. She has no rales. She exhibits no tenderness.  Abdominal: Soft. Bowel sounds are normal. She exhibits no distension and no mass. There is no tenderness.  Musculoskeletal: Normal range of motion. She exhibits tenderness. She exhibits no edema.  Right hip pain with ROM.  Neurological: She is alert and oriented to person, place, and time. She has normal reflexes. No cranial nerve deficit. She exhibits normal muscle tone. Coordination normal.  Skin: Skin is dry. No rash noted. She is not diaphoretic. No pallor.  Right hip surgical incision healed.   Psychiatric: Her behavior is normal. Judgment and thought content normal. Her mood appears anxious. Her speech is not rapid and/or pressured, not delayed, not tangential and not slurred. Cognition and memory are impaired. She exhibits a depressed mood. She is communicative. She exhibits abnormal recent memory. She exhibits normal remote memory.    Labs reviewed:  Recent Labs  07/06/14 1726  04/07/15 1635 04/08/15 0733 04/09/15 0500 04/17/15  NA 140  < > 139 138 138  138  K 4.3  < > 4.2 3.8 3.2* 3.7  CL 105  --  104 98* 102  --   CO2 26  --  25 27  25  --   GLUCOSE 97  --  102* 146* 91  --   BUN 20  < > 17 12 19 16   CREATININE 0.64  < > 0.75 0.76 0.74 0.8  CALCIUM 8.9  --  8.9 8.9 8.5*  --   MG 1.9  --   --   --   --   --   < > = values in this interval not displayed.  Recent Labs  07/06/14 1726  04/07/15 1635 04/09/15 0500 04/17/15  AST 11  < > 16 13* 15  ALT 8  < > 9* 8* 9  ALKPHOS 68  < > 81 69 63  BILITOT 0.3  --  0.7 0.5  --   PROT 5.7*  --  6.2* 5.3*  --   ALBUMIN 3.6  --  3.0* 2.8*  --   < > = values in this interval not displayed.  Recent Labs  07/06/14 1726  04/07/15 1635 04/08/15 0733 04/09/15 0500 04/17/15  WBC 7.1  < > 8.5 13.7* 9.7 7.7  NEUTROABS 4.3  --  5.7  --   --   --   HGB 12.2  < > 12.0 13.1 11.1* 11.2*  HCT 38.2  < > 38.1 41.1 35.2* 34*  MCV 87.8  --  91.6 90.9 90.3  --   PLT 348  < > 378 436* 371 338  < > = values in this interval not displayed. Lab Results  Component Value Date   TSH 2.43 05/29/2015   Lab Results  Component Value Date   HGBA1C 5.8* 07/06/2014   Lab Results  Component Value Date   CHOL 209* 07/06/2014   HDL 66 07/06/2014   LDLCALC 111* 07/06/2014   TRIG 160* 07/06/2014   CHOLHDL 3.2 07/06/2014    Significant Diagnostic Results in last 30 days:  No results found.  Assessment/Plan: Hordeolum externum (stye) lower eyelids redness, a sty seen right lower eyelids, mild ectropion noted, not injected, no discharge seen.    GERD Stable, continue Famotidine 20mg  daily.  Hypothyroidism Last TSH 2.188 09/2014, 05/29/15 TSH 2.43 continue Levothyroxine 157mcg po daily  Essential hypertension Controlled, continue Labetalol 100mg  bid.  Dementia of the Alzheimer's type 05/19/14 MMSE 18/30, continue Namenda, SNF for care needs  Anemia 03/27/15 Hgb 11.5 04/17/15 Hgb 11.2  Constipation Stable, continue Senokot S II nightly  Hypokalemia Continue Kcl 68meq daily, 04/17/15 K 3.7  Pleural  effusion, left Exudative left pleural effusion-status post removal of 850 ml of pleural fluid Inflammation pleural fluid culture and cytology 04/16/15 CXR left basilar opacity likely representing infectious process, chronic chest findings are present.   Diastolic CHF, acute on chronic (HCC) Clinically compensated.      Family/ staff Communication: continue SNF for care needs  Labs/tests ordered:  none

## 2015-06-06 NOTE — Assessment & Plan Note (Signed)
Clinically compensated.

## 2015-06-06 NOTE — Assessment & Plan Note (Signed)
Continue Kcl 67meq daily, 04/17/15 K 3.7

## 2015-06-06 NOTE — Assessment & Plan Note (Signed)
Controlled, continue Labetalol 100mg bid 

## 2015-06-06 NOTE — Assessment & Plan Note (Signed)
Stable, continue Senokot S II nightly.  

## 2015-06-06 NOTE — Assessment & Plan Note (Signed)
03/27/15 Hgb 11.5 04/17/15 Hgb 11.2

## 2015-06-12 DIAGNOSIS — H353221 Exudative age-related macular degeneration, left eye, with active choroidal neovascularization: Secondary | ICD-10-CM | POA: Diagnosis not present

## 2015-07-06 ENCOUNTER — Encounter: Payer: Self-pay | Admitting: Nurse Practitioner

## 2015-07-06 ENCOUNTER — Non-Acute Institutional Stay (SKILLED_NURSING_FACILITY): Payer: Medicare Other | Admitting: Nurse Practitioner

## 2015-07-06 DIAGNOSIS — D649 Anemia, unspecified: Secondary | ICD-10-CM

## 2015-07-06 DIAGNOSIS — J90 Pleural effusion, not elsewhere classified: Secondary | ICD-10-CM

## 2015-07-06 DIAGNOSIS — K5900 Constipation, unspecified: Secondary | ICD-10-CM

## 2015-07-06 DIAGNOSIS — G309 Alzheimer's disease, unspecified: Secondary | ICD-10-CM

## 2015-07-06 DIAGNOSIS — I5033 Acute on chronic diastolic (congestive) heart failure: Secondary | ICD-10-CM

## 2015-07-06 DIAGNOSIS — F0280 Dementia in other diseases classified elsewhere without behavioral disturbance: Secondary | ICD-10-CM

## 2015-07-06 DIAGNOSIS — E039 Hypothyroidism, unspecified: Secondary | ICD-10-CM | POA: Diagnosis not present

## 2015-07-06 DIAGNOSIS — K219 Gastro-esophageal reflux disease without esophagitis: Secondary | ICD-10-CM

## 2015-07-06 DIAGNOSIS — J948 Other specified pleural conditions: Secondary | ICD-10-CM

## 2015-07-06 DIAGNOSIS — E876 Hypokalemia: Secondary | ICD-10-CM | POA: Diagnosis not present

## 2015-07-06 DIAGNOSIS — H00013 Hordeolum externum right eye, unspecified eyelid: Secondary | ICD-10-CM | POA: Diagnosis not present

## 2015-07-06 DIAGNOSIS — I10 Essential (primary) hypertension: Secondary | ICD-10-CM

## 2015-07-06 NOTE — Assessment & Plan Note (Signed)
Healed

## 2015-07-06 NOTE — Assessment & Plan Note (Signed)
Clinically compensated.

## 2015-07-06 NOTE — Assessment & Plan Note (Signed)
Stable, continue Senokot S II nightly.  

## 2015-07-06 NOTE — Assessment & Plan Note (Addendum)
Continue Kcl 49meq daily, 04/17/15 K 3.7, repeat CMP

## 2015-07-06 NOTE — Assessment & Plan Note (Addendum)
03/27/15 Hgb 11.5 04/17/15 Hgb 11.2 Update CBC

## 2015-07-06 NOTE — Assessment & Plan Note (Signed)
TSH 2.188 09/2014, 05/29/15 TSH 2.43 continue Levothyroxine 156mcg po daily

## 2015-07-06 NOTE — Assessment & Plan Note (Signed)
05/19/14 MMSE 18/30, continue Namenda, SNF for care needs 

## 2015-07-06 NOTE — Progress Notes (Signed)
Patient ID: Crystal Petersen, female   DOB: 18-Oct-1921, 82 y.o.   MRN: [id number]  Location:  [facility] Room Number: 89 Place of Service:  SNF (31) Provider: Lennie Odor Gale Hulse NP  Alesia Richards, MD  Patient Care Team: Unk Pinto, MD as PCP - General (Internal Medicine) Hurman Horn, MD as Consulting Physician (Ophthalmology) Burnell Blanks, MD as Consulting Physician (Cardiology) Inda Castle, MD as Consulting Physician (Gastroenterology) Jari Pigg, MD as Consulting Physician (Dermatology) Dorna Leitz, MD as Consulting Physician (Orthopedic Surgery)  Extended Emergency Contact Information Primary Emergency Contact: [facility] Address: [address]          [place], [place] [address] Johnnette Litter of [place] Phone: [phone] Relation: Daughter Secondary Emergency Contact: [place], [place], [place] [address] [place] Phone: [phone] Relation: Son  Code Status: DNR Goals of care: Advanced Directive information Advanced Directives 07/06/2015  Does patient have an advance directive? Yes  Type of Advance Directive Out of facility DNR (pink MOST or yellow form)  Does patient want to make changes to advanced directive? No - Patient declined  Copy of advanced directive(s) in chart? Yes     Chief Complaint  Patient presents with  . Medical Management of Chronic Issues    Routine visit    HPI:  Pt is a 82 y.o. female seen today for evaluation of chronic medical conditions: Hx of GERD asymptomatic, takes Famotidine 20mg  daily, last TSH wnl 09/2014 while on Levothyroxine 164mcg, controlled blood pressure while taking Labetalol 100mg  bid, taking Namenda for memory loss, she is functioning adequately in SNF. Denied pain, constipation, or trouble sleeping at night.     04/07/15-04/09/15 hospitalized for chest pain,  ABD and low back pain. She was found to have UTI, and was placed on Keflex. A Chest X-ray  revealed a Bilateral Pleuarl Effusions, with the left greater than right. s/p thorocentesis.    Past Medical History  Diagnosis Date  . Hypertension   . Nonspecific abnormal electrocardiogram (ECG) (EKG)   . Stricture and stenosis of esophagus   . Esophageal reflux   . Diverticulosis of colon (without mention of hemorrhage)   . Benign neoplasm of colon   . Macular degeneration   . Osteoarthritis     right hip  . Other and unspecified hyperlipidemia   . HTN (hypertension)   . Shingles   . Cystocele   . Gastroparesis   . Skin cancer (melanoma) ([facility] [place])   . Hypothyroidism   . Cancer ([facility])     pre-melanoma  . Hemorrhoids   . Primary osteoarthritis of right hip 03/27/2014  . Acute blood loss anemia 03/29/2014    03/30/14 Hgb 7.3 04/04/14 Hgb 8.5 continue Fe bid.  05/04/14 Hgb 10.8   . Dementia of the Alzheimer's type 05/22/2014    05/19/14 MMSE 18/30 Namenda.    . Diastolic dysfunction [id number]  . Diverticulosis of large intestine 06/28/2004  . Essential hypertension 07/05/2013  . Hyperlipidemia 07/05/2013  . Internal hemorrhoids without mention of complication [id number]  . Prediabetes 11/17/2013  . Stricture and stenosis of esophagus 10/27/2007  . Vitamin D deficiency 11/17/2013   Past Surgical History  Procedure Laterality Date  . Cholecystectomy    . Gallbladder surgery  2002  . Hemorrhoid surgery    . Appendectomy    . Thyroidectomy    . Left eye surgery    . Cataract extraction, bilateral    . Melenoma  resection      right arm  . Bcc resection      legs/face  . Tonsillectomy    . Flexible sigmoidoscopy  05/12/2011    Procedure: FLEXIBLE SIGMOIDOSCOPY;  Surgeon: Inda Castle, MD;  Location: [facility];  Service: Endoscopy;  Laterality: N/A;  . Flexible sigmoidoscopy  09/10/2011    Procedure: FLEXIBLE SIGMOIDOSCOPY;  Surgeon: Inda Castle, MD;  Location: [facility];  Service: Endoscopy;  Laterality: N/A;  . Fracture surgery      left hip pin  . Flexible sigmoidoscopy  N/A 08/16/2012    Procedure: FLEXIBLE SIGMOIDOSCOPY;  Surgeon: Inda Castle, MD;  Location: [facility];  Service: Endoscopy;  Laterality: N/A;  . Hemorrhoid banding N/A 08/16/2012    Procedure: HEMORRHOID BANDING;  Surgeon: Inda Castle, MD;  Location: [facility];  Service: Endoscopy;  Laterality: N/A;  . Total hip arthroplasty Right 03/27/2014    Procedure: TOTAL HIP ARTHROPLASTY ANTERIOR APPROACH;  Surgeon: Alta Corning, MD;  Location: [facility];  Service: Orthopedics;  Laterality: Right;    Allergies  Allergen Reactions  . Ace Inhibitors Other (See Comments)    Elevated potassium  . Statins Other (See Comments)    Myalgias   . Vicodin [Hydrocodone-Acetaminophen] Other (See Comments)    "felt spaced out"  . Fosamax [Alendronate Sodium] Other (See Comments)  . Metoclopramide Hcl Other (See Comments)  . Sulfa Antibiotics Rash  . Sulfonamide Derivatives Rash  . Vesicare [Solifenacin] Other (See Comments)      Medication List       This list is accurate as of: 07/06/15 12:27 PM.  Always use your most recent med list.               acetaminophen 500 MG tablet  Commonly known as:  TYLENOL  Take 1,000 mg by mouth 3 (three) times daily as needed for mild pain or moderate pain.     aspirin EC 81 MG tablet  Take 81 mg by mouth daily.     CAL-GEST ANTACID 500 MG chewable tablet  Generic drug:  calcium carbonate  Chew 1 tablet by mouth daily as needed for indigestion or heartburn.     docusate sodium 100 MG capsule  Commonly known as:  COLACE  Take 100 mg by mouth 2 (two) times daily as needed for mild constipation.     famotidine 20 MG tablet  Commonly known as:  PEPCID  Take 20 mg by mouth at bedtime.     guaiFENesin 100 MG/5ML Soln  Commonly known as:  ROBITUSSIN  Take 10 mLs by mouth every 6 (six) hours as needed for cough or to loosen phlegm.     labetalol 100 MG tablet  Commonly known as:  NORMODYNE  Take 100 mg by mouth 2 (two) times daily.      levothyroxine 100 MCG tablet  Commonly known as:  SYNTHROID, LEVOTHROID  Take 100 mcg by mouth daily before breakfast.     memantine 28 MG Cp24 24 hr capsule  Commonly known as:  NAMENDA XR  Take 28 mg by mouth daily.     potassium chloride SA 20 MEQ tablet  Commonly known as:  K-DUR,KLOR-CON  Take 40 mEq by mouth daily.     PRESERVISION AREDS 2 Caps  Take 1 capsule by mouth 2 (two) times daily.     protein supplement Powd  Take 1 scoop by mouth daily at 8 pm.     sennosides-docusate sodium 8.6-50 MG tablet  Commonly known  as:  SENOKOT-S  Take 2 tablets by mouth at bedtime.     Vitamin D 2000 units tablet  Take 4,000 Units by mouth daily.        Review of Systems  Constitutional: Negative for fever and chills.  HENT: Positive for hearing loss. Negative for congestion, ear discharge and ear pain.   Eyes: Negative for pain, discharge and redness.       Lower eyelids redness, a sty seen right lower eyelids, mild ectropion noted, healed   Respiratory: Negative for cough, shortness of breath and wheezing.   Cardiovascular: Negative for chest pain and leg swelling.  Gastrointestinal: Negative for nausea, vomiting, abdominal pain and constipation.  Genitourinary: Positive for frequency. Negative for dysuria and urgency.  Musculoskeletal: Positive for back pain. Negative for myalgias and neck pain.  Skin: Negative for rash.  Neurological: Negative for dizziness, tremors, seizures, weakness and headaches.  Psychiatric/Behavioral: Negative for suicidal ideas and hallucinations. The patient is nervous/anxious.     Immunization History  Administered Date(s) Administered  . Influenza, High Dose Seasonal PF 12/27/2013  . Influenza-Unspecified 01/12/2013, 01/02/2015  . Pneumococcal-Unspecified 07/01/2012  . Td 05/27/2004  . Tdap 02/21/2012  . Zoster 04/15/2007   Pertinent  Health Maintenance Due  Topic Date Due  . PNA vac Low Risk Adult (2 of 2 - PCV13) 07/01/2013  .  INFLUENZA VACCINE  11/13/2015  . DEXA SCAN  Completed   Fall Risk  05/22/2014 10/06/2013 06/02/2013  Falls in the past year? No No No  Risk for fall due to : - History of fall(s);Impaired balance/gait;Impaired mobility Impaired mobility   Functional Status Survey:    Filed Vitals:   07/06/15 1022  BP: 130/80  Pulse: 70  Temp: 98.4 F (36.9 C)  TempSrc: Oral  Resp: 18  Height: 5\' 1"  (1.549 m)  Weight: 131 lb 12.8 oz (59.784 kg)   Body mass index is 24.92 kg/(m^2). Physical Exam  Constitutional: She is oriented to person, place, and time. She appears well-developed and well-nourished. No distress.  HENT:  Head: Normocephalic and atraumatic.  Right Ear: External ear normal.  Mouth/Throat: No oropharyngeal exudate.  Eyes: Conjunctivae and EOM are normal. Pupils are equal, round, and reactive to light. Right eye exhibits no discharge. Left eye exhibits no discharge. No scleral icterus.  lower eyelids redness, mild ectropion noted   Neck: Normal range of motion. Neck supple. No JVD present. No tracheal deviation present. No thyromegaly present.  Cardiovascular: Normal rate, regular rhythm, normal heart sounds and intact distal pulses.   No murmur heard. Pulmonary/Chest: Effort normal and breath sounds normal. No respiratory distress. She has no wheezes. She has no rales. She exhibits no tenderness.  Abdominal: Soft. Bowel sounds are normal. She exhibits no distension and no mass. There is no tenderness.  Musculoskeletal: Normal range of motion. She exhibits tenderness. She exhibits no edema.  Right hip pain with ROM.  Neurological: She is alert and oriented to person, place, and time. She has normal reflexes. No cranial nerve deficit. She exhibits normal muscle tone. Coordination normal.  Skin: Skin is dry. No rash noted. She is not diaphoretic. No pallor.  Right hip surgical incision healed.   Psychiatric: Her behavior is normal. Judgment and thought content normal. Her mood appears  anxious. Her speech is not rapid and/or pressured, not delayed, not tangential and not slurred. Cognition and memory are impaired. She exhibits a depressed mood. She is communicative. She exhibits abnormal recent memory. She exhibits normal remote memory.  Labs reviewed:  Recent Labs  07/06/14 1726  04/07/15 1635 04/08/15 0733 04/09/15 0500 04/17/15  NA 140  < > 139 138 138 138  K 4.3  < > 4.2 3.8 3.2* 3.7  CL 105  --  104 98* 102  --   CO2 26  --  25 27 25   --   GLUCOSE 97  --  102* 146* 91  --   BUN 20  < > 17 12 19 16   CREATININE 0.64  < > 0.75 0.76 0.74 0.8  CALCIUM 8.9  --  8.9 8.9 8.5*  --   MG 1.9  --   --   --   --   --   < > = values in this interval not displayed.  Recent Labs  07/06/14 1726  04/07/15 1635 04/09/15 0500 04/17/15  AST 11  < > 16 13* 15  ALT 8  < > 9* 8* 9  ALKPHOS 68  < > 81 69 63  BILITOT 0.3  --  0.7 0.5  --   PROT 5.7*  --  6.2* 5.3*  --   ALBUMIN 3.6  --  3.0* 2.8*  --   < > = values in this interval not displayed.  Recent Labs  07/06/14 1726  04/07/15 1635 04/08/15 0733 04/09/15 0500 04/17/15  WBC 7.1  < > 8.5 13.7* 9.7 7.7  NEUTROABS 4.3  --  5.7  --   --   --   HGB 12.2  < > 12.0 13.1 11.1* 11.2*  HCT 38.2  < > 38.1 41.1 35.2* 34*  MCV 87.8  --  91.6 90.9 90.3  --   PLT 348  < > 378 436* 371 338  < > = values in this interval not displayed. Lab Results  Component Value Date   TSH 2.43 05/29/2015   Lab Results  Component Value Date   HGBA1C 5.8* 07/06/2014   Lab Results  Component Value Date   CHOL 209* 07/06/2014   HDL 66 07/06/2014   LDLCALC 111* 07/06/2014   TRIG 160* 07/06/2014   CHOLHDL 3.2 07/06/2014    Significant Diagnostic Results in last 30 days:  No results found.  Assessment/Plan: Anemia 03/27/15 Hgb 11.5 04/17/15 Hgb 11.2 Update CBC  Constipation Stable, continue Senokot S II nightly  Dementia of the Alzheimer's type 05/19/14 MMSE 18/30, continue Namenda, SNF for care needs  Diastolic CHF, acute  on chronic (HCC) Clinically compensated.   Essential hypertension Controlled, continue Labetalol 100mg  bid. Update CMP  GERD Stable, continue Famotidine 20mg  daily.  Hordeolum externum (stye) Healed.   Hypokalemia Continue Kcl 48meq daily, 04/17/15 K 3.7, repeat CMP  Hypothyroidism TSH 2.188 09/2014, 05/29/15 TSH 2.43 continue Levothyroxine 157mcg po daily  Pleural effusion, left Exudative left pleural effusion-status post removal of 850 ml of pleural fluid Inflammation pleural fluid culture and cytology 04/16/15 CXR left basilar opacity likely representing infectious process, chronic chest findings are present. Update CXR     Family/ staff Communication: continue SNF for care needs  Labs/tests ordered:  CBC, CMP, CXR

## 2015-07-06 NOTE — Assessment & Plan Note (Addendum)
Controlled, continue Labetalol 100mg  bid. Update CMP

## 2015-07-06 NOTE — Assessment & Plan Note (Signed)
Stable, continue Famotidine 20mg daily.   

## 2015-07-06 NOTE — Assessment & Plan Note (Addendum)
Exudative left pleural effusion-status post removal of 850 ml of pleural fluid Inflammation pleural fluid culture and cytology 04/16/15 CXR left basilar opacity likely representing infectious process, chronic chest findings are present. Update CXR

## 2015-07-07 DIAGNOSIS — J189 Pneumonia, unspecified organism: Secondary | ICD-10-CM | POA: Diagnosis not present

## 2015-07-10 DIAGNOSIS — I10 Essential (primary) hypertension: Secondary | ICD-10-CM | POA: Diagnosis not present

## 2015-07-10 LAB — CBC AND DIFFERENTIAL
HCT: 36 % (ref 36–46)
Hemoglobin: 11.9 g/dL — AB (ref 12.0–16.0)
Platelets: 231 10*3/uL (ref 150–399)
WBC: 6.2 10^3/mL

## 2015-07-10 LAB — HEPATIC FUNCTION PANEL
ALK PHOS: 55 U/L (ref 25–125)
ALT: 7 U/L (ref 7–35)
AST: 12 U/L — AB (ref 13–35)
Bilirubin, Total: 0.5 mg/dL

## 2015-07-10 LAB — BASIC METABOLIC PANEL
BUN: 19 mg/dL (ref 4–21)
CREATININE: 0.8 mg/dL (ref 0.5–1.1)
Glucose: 74 mg/dL
POTASSIUM: 3.9 mmol/L (ref 3.4–5.3)
Sodium: 141 mmol/L (ref 137–147)

## 2015-07-17 DIAGNOSIS — H35052 Retinal neovascularization, unspecified, left eye: Secondary | ICD-10-CM | POA: Diagnosis not present

## 2015-07-17 DIAGNOSIS — H353134 Nonexudative age-related macular degeneration, bilateral, advanced atrophic with subfoveal involvement: Secondary | ICD-10-CM | POA: Diagnosis not present

## 2015-07-17 DIAGNOSIS — H353221 Exudative age-related macular degeneration, left eye, with active choroidal neovascularization: Secondary | ICD-10-CM | POA: Diagnosis not present

## 2015-08-07 ENCOUNTER — Non-Acute Institutional Stay (SKILLED_NURSING_FACILITY): Payer: Medicare Other | Admitting: Nurse Practitioner

## 2015-08-07 DIAGNOSIS — G309 Alzheimer's disease, unspecified: Secondary | ICD-10-CM | POA: Diagnosis not present

## 2015-08-07 DIAGNOSIS — K5900 Constipation, unspecified: Secondary | ICD-10-CM | POA: Diagnosis not present

## 2015-08-07 DIAGNOSIS — K219 Gastro-esophageal reflux disease without esophagitis: Secondary | ICD-10-CM | POA: Diagnosis not present

## 2015-08-07 DIAGNOSIS — E876 Hypokalemia: Secondary | ICD-10-CM

## 2015-08-07 DIAGNOSIS — I10 Essential (primary) hypertension: Secondary | ICD-10-CM | POA: Diagnosis not present

## 2015-08-07 DIAGNOSIS — J948 Other specified pleural conditions: Secondary | ICD-10-CM | POA: Diagnosis not present

## 2015-08-07 DIAGNOSIS — J90 Pleural effusion, not elsewhere classified: Secondary | ICD-10-CM

## 2015-08-07 DIAGNOSIS — D649 Anemia, unspecified: Secondary | ICD-10-CM | POA: Diagnosis not present

## 2015-08-07 DIAGNOSIS — E039 Hypothyroidism, unspecified: Secondary | ICD-10-CM | POA: Diagnosis not present

## 2015-08-07 DIAGNOSIS — I5033 Acute on chronic diastolic (congestive) heart failure: Secondary | ICD-10-CM

## 2015-08-07 DIAGNOSIS — F0280 Dementia in other diseases classified elsewhere without behavioral disturbance: Secondary | ICD-10-CM

## 2015-08-07 NOTE — Progress Notes (Signed)
Patient ID: Crystal Petersen, female   DOB: 1921-10-25, 82 y.o.   MRN: [id number]  Location:  [facility] Room Number: 68 Place of Service:  SNF (31) Provider: Lennie Odor Adren Dollins NP  Alesia Richards, MD  Patient Care Team: Unk Pinto, MD as PCP - General (Internal Medicine) Hurman Horn, MD as Consulting Physician (Ophthalmology) Burnell Blanks, MD as Consulting Physician (Cardiology) Inda Castle, MD as Consulting Physician (Gastroenterology) Jari Pigg, MD as Consulting Physician (Dermatology) Dorna Leitz, MD as Consulting Physician (Orthopedic Surgery)  Extended Emergency Contact Information Primary Emergency Contact: [facility] Address: [address]          [place], [place] [address] [name] Phone: [phone] Relation: Daughter Secondary Emergency Contact: [name], [name], [name] [address] [place] Phone: [phone] Relation: Son  Code Status: DNR Goals of care: Advanced Directive information Advanced Directives 08/07/2015  Does patient have an advance directive? Yes  Type of Advance Directive Out of facility DNR (pink MOST or yellow form)  Does patient want to make changes to advanced directive? No - Patient declined  Copy of advanced directive(s) in chart? Yes     Chief Complaint  Patient presents with  . Medical Management of Chronic Issues    Routine Visit     HPI:  Pt is a 82 y.o. female seen today for evaluation of chronic medical conditions: Hx of GERD asymptomatic, takes Famotidine 20mg  daily, last TSH wnl 09/2014 while on Levothyroxine 192mcg, controlled blood pressure while taking Labetalol 100mg  bid, taking Namenda for memory loss, she is functioning adequately in SNF. Denied pain, constipation, or trouble sleeping at night.     04/07/15-04/09/15 hospitalized for chest pain,  ABD and low back pain. She was found to have UTI, and was placed on Keflex. A Chest X-ray  revealed a Bilateral Pleuarl Effusions, with the left greater than right. s/p thorocentesis.    Past Medical History  Diagnosis Date  . Hypertension   . Nonspecific abnormal electrocardiogram (ECG) (EKG)   . Stricture and stenosis of esophagus   . Esophageal reflux   . Diverticulosis of colon (without mention of hemorrhage)   . Benign neoplasm of colon   . Macular degeneration   . Osteoarthritis     right hip  . Other and unspecified hyperlipidemia   . HTN (hypertension)   . Shingles   . Cystocele   . Gastroparesis   . Skin cancer (melanoma) ([facility])   . Hypothyroidism   . Cancer ([facility])     pre-melanoma  . Hemorrhoids   . Primary osteoarthritis of right hip 03/27/2014  . Acute blood loss anemia 03/29/2014    03/30/14 Hgb 7.3 04/04/14 Hgb 8.5 continue Fe bid.  05/04/14 Hgb 10.8   . Dementia of the Alzheimer's type 05/22/2014    05/19/14 MMSE 18/30 Namenda.    . Diastolic dysfunction [id number]  . Diverticulosis of large intestine 06/28/2004  . Essential hypertension 07/05/2013  . Hyperlipidemia 07/05/2013  . Internal hemorrhoids without mention of complication [id number]  . Prediabetes 11/17/2013  . Stricture and stenosis of esophagus 10/27/2007  . Vitamin D deficiency 11/17/2013   Past Surgical History  Procedure Laterality Date  . Cholecystectomy    . Gallbladder surgery  2002  . Hemorrhoid surgery    . Appendectomy    . Thyroidectomy    . Left eye surgery    . Cataract extraction, bilateral    .  Melenoma resection      right arm  . Bcc resection      legs/face  . Tonsillectomy    . Flexible sigmoidoscopy  05/12/2011    Procedure: FLEXIBLE SIGMOIDOSCOPY;  Surgeon: Inda Castle, MD;  Location: [facility];  Service: Endoscopy;  Laterality: N/A;  . Flexible sigmoidoscopy  09/10/2011    Procedure: FLEXIBLE SIGMOIDOSCOPY;  Surgeon: Inda Castle, MD;  Location: [facility];  Service: Endoscopy;  Laterality: N/A;  . Fracture surgery      left hip pin  . Flexible sigmoidoscopy  N/A 08/16/2012    Procedure: FLEXIBLE SIGMOIDOSCOPY;  Surgeon: Inda Castle, MD;  Location: [facility];  Service: Endoscopy;  Laterality: N/A;  . Hemorrhoid banding N/A 08/16/2012    Procedure: HEMORRHOID BANDING;  Surgeon: Inda Castle, MD;  Location: [facility];  Service: Endoscopy;  Laterality: N/A;  . Total hip arthroplasty Right 03/27/2014    Procedure: TOTAL HIP ARTHROPLASTY ANTERIOR APPROACH;  Surgeon: Alta Corning, MD;  Location: [facility];  Service: Orthopedics;  Laterality: Right;    Allergies  Allergen Reactions  . Ace Inhibitors Other (See Comments)    Elevated potassium  . Statins Other (See Comments)    Myalgias   . Vicodin [Hydrocodone-Acetaminophen] Other (See Comments)    "felt spaced out"  . Fosamax [Alendronate Sodium] Other (See Comments)  . Metoclopramide Hcl Other (See Comments)  . Sulfa Antibiotics Rash  . Sulfonamide Derivatives Rash  . Vesicare [Solifenacin] Other (See Comments)      Medication List       This list is accurate as of: 08/07/15 11:59 PM.  Always use your most recent med list.               acetaminophen 500 MG tablet  Commonly known as:  TYLENOL  Take 1,000 mg by mouth 3 (three) times daily as needed for mild pain or moderate pain.     aspirin EC 81 MG tablet  Take 81 mg by mouth daily.     CAL-GEST ANTACID 500 MG chewable tablet  Generic drug:  calcium carbonate  Chew 1 tablet by mouth daily as needed for indigestion or heartburn.     docusate sodium 100 MG capsule  Commonly known as:  COLACE  Take 100 mg by mouth 2 (two) times daily as needed for mild constipation.     famotidine 20 MG tablet  Commonly known as:  PEPCID  Take 20 mg by mouth at bedtime.     guaiFENesin 100 MG/5ML Soln  Commonly known as:  ROBITUSSIN  Take 10 mLs by mouth every 6 (six) hours as needed for cough or to loosen phlegm.     labetalol 100 MG tablet  Commonly known as:  NORMODYNE  Take 100 mg by mouth 2 (two) times daily.      levothyroxine 100 MCG tablet  Commonly known as:  SYNTHROID, LEVOTHROID  Take 100 mcg by mouth daily before breakfast.     memantine 28 MG Cp24 24 hr capsule  Commonly known as:  NAMENDA XR  Take 28 mg by mouth daily.     OVER THE COUNTER MEDICATION  Take 5 mLs by mouth 4 (four) times daily -  before meals and at bedtime. Magic mouth wash     polyethylene glycol packet  Commonly known as:  MIRALAX / GLYCOLAX  Take 17 g by mouth daily.     potassium chloride SA 20 MEQ tablet  Commonly known as:  K-DUR,KLOR-CON  Take 40  mEq by mouth daily.     PRESERVISION AREDS 2 Caps  Take 1 capsule by mouth 2 (two) times daily.     protein supplement Powd  Take 1 scoop by mouth daily at 8 pm.     sennosides-docusate sodium 8.6-50 MG tablet  Commonly known as:  SENOKOT-S  Take 2 tablets by mouth at bedtime.     Vitamin D 2000 units tablet  Take 4,000 Units by mouth daily.        Review of Systems  Constitutional: Negative for fever and chills.  HENT: Positive for hearing loss. Negative for congestion, ear discharge and ear pain.   Eyes: Negative for pain, discharge and redness.       Lower eyelids redness, a sty seen right lower eyelids, mild ectropion noted, healed   Respiratory: Negative for cough, shortness of breath and wheezing.   Cardiovascular: Negative for chest pain and leg swelling.  Gastrointestinal: Negative for nausea, vomiting, abdominal pain and constipation.  Genitourinary: Positive for frequency. Negative for dysuria and urgency.  Musculoskeletal: Positive for back pain. Negative for myalgias and neck pain.  Skin: Negative for rash.  Neurological: Negative for dizziness, tremors, seizures, weakness and headaches.  Psychiatric/Behavioral: Negative for suicidal ideas and hallucinations. The patient is nervous/anxious.     Immunization History  Administered Date(s) Administered  . Influenza, High Dose Seasonal PF 12/27/2013  . Influenza-Unspecified 01/12/2013,  01/02/2015  . Pneumococcal-Unspecified 07/01/2012  . Td 05/27/2004  . Tdap 02/21/2012  . Zoster 04/15/2007   Pertinent  Health Maintenance Due  Topic Date Due  . PNA vac Low Risk Adult (2 of 2 - PCV13) 07/01/2013  . INFLUENZA VACCINE  11/13/2015  . DEXA SCAN  Completed   Fall Risk  05/22/2014 10/06/2013 06/02/2013  Falls in the past year? No No No  Risk for fall due to : - History of fall(s);Impaired balance/gait;Impaired mobility Impaired mobility   Functional Status Survey:    Filed Vitals:   08/09/15 1553  BP: 134/82  Pulse: 78  Temp: 97.1 F (36.2 C)  TempSrc: Oral  Resp: 18  Height: 5\' 1"  (1.549 m)  Weight: 131 lb (59.421 kg)   Body mass index is 24.76 kg/(m^2). Physical Exam  Constitutional: She is oriented to person, place, and time. She appears well-developed and well-nourished. No distress.  HENT:  Head: Normocephalic and atraumatic.  Right Ear: External ear normal.  Mouth/Throat: No oropharyngeal exudate.  Eyes: Conjunctivae and EOM are normal. Pupils are equal, round, and reactive to light. Right eye exhibits no discharge. Left eye exhibits no discharge. No scleral icterus.  lower eyelids redness, mild ectropion noted   Neck: Normal range of motion. Neck supple. No JVD present. No tracheal deviation present. No thyromegaly present.  Cardiovascular: Normal rate, regular rhythm, normal heart sounds and intact distal pulses.   No murmur heard. Pulmonary/Chest: Effort normal and breath sounds normal. No respiratory distress. She has no wheezes. She has no rales. She exhibits no tenderness.  Abdominal: Soft. Bowel sounds are normal. She exhibits no distension and no mass. There is no tenderness.  Musculoskeletal: Normal range of motion. She exhibits tenderness. She exhibits no edema.  Right hip pain with ROM.  Neurological: She is alert and oriented to person, place, and time. She has normal reflexes. No cranial nerve deficit. She exhibits normal muscle tone.  Coordination normal.  Skin: Skin is dry. No rash noted. She is not diaphoretic. No pallor.  Right hip surgical incision healed.   Psychiatric: Her behavior is normal.  Judgment and thought content normal. Her mood appears anxious. Her speech is not rapid and/or pressured, not delayed, not tangential and not slurred. Cognition and memory are impaired. She exhibits a depressed mood. She is communicative. She exhibits abnormal recent memory. She exhibits normal remote memory.    Labs reviewed:  Recent Labs  04/07/15 1635 04/08/15 0733 04/09/15 0500 04/17/15 07/10/15  NA 139 138 138 138 141  K 4.2 3.8 3.2* 3.7 3.9  CL 104 98* 102  --   --   CO2 25 27 25   --   --   GLUCOSE 102* 146* 91  --   --   BUN 17 12 19 16 19   CREATININE 0.75 0.76 0.74 0.8 0.8  CALCIUM 8.9 8.9 8.5*  --   --     Recent Labs  04/07/15 1635 04/09/15 0500 04/17/15 07/10/15  AST 16 13* 15 12*  ALT 9* 8* 9 7  ALKPHOS 81 69 63 55  BILITOT 0.7 0.5  --   --   PROT 6.2* 5.3*  --   --   ALBUMIN 3.0* 2.8*  --   --     Recent Labs  04/07/15 1635 04/08/15 0733 04/09/15 0500 04/17/15 07/10/15  WBC 8.5 13.7* 9.7 7.7 6.2  NEUTROABS 5.7  --   --   --   --   HGB 12.0 13.1 11.1* 11.2* 11.9*  HCT 38.1 41.1 35.2* 34* 36  MCV 91.6 90.9 90.3  --   --   PLT 378 436* 371 338 231   Lab Results  Component Value Date   TSH 2.43 05/29/2015   Lab Results  Component Value Date   HGBA1C 5.8* 07/06/2014   Lab Results  Component Value Date   CHOL 209* 07/06/2014   HDL 66 07/06/2014   LDLCALC 111* 07/06/2014   TRIG 160* 07/06/2014   CHOLHDL 3.2 07/06/2014    Significant Diagnostic Results in last 30 days:  No results found.  Assessment/Plan: Anemia 03/27/15 Hgb 11.5 04/17/15 Hgb 11.2 07/10/15 Hgb 11.9  Constipation Stable, continue Senokot S II nightly, 07/23/15 MiraLax daily.    Dementia of the Alzheimer's type 05/19/14 MMSE 18/30, continue Namenda, SNF for care needs   Diastolic CHF, acute on chronic  (HCC) Clinically compensated.    Essential hypertension Controlled, continue Labetalol 100mg  bid.   GERD Stable, continue Famotidine 20mg  daily.   Hypokalemia Continue Kcl 53meq daily, 04/17/15 K 3.7, 07/10/15 K 3.9   Hypothyroidism TSH 2.188 09/2014, 05/29/15 TSH 2.43 continue Levothyroxine 162mcg po daily  Pleural effusion, left Exudative left pleural effusion-status post removal of 850 ml of pleural fluid Inflammation pleural fluid culture and cytology 04/16/15 CXR left basilar opacity likely representing infectious process, chronic chest findings are present. 07/07/15 CXR cardiomegaly, no acute pulmonary pathology.         Family/ staff Communication: continue SNF for care needs  Labs/tests ordered:  none

## 2015-08-07 NOTE — Assessment & Plan Note (Signed)
Stable, continue Famotidine 20mg daily.   

## 2015-08-07 NOTE — Assessment & Plan Note (Signed)
Clinically compensated.

## 2015-08-07 NOTE — Assessment & Plan Note (Signed)
TSH 2.188 09/2014, 05/29/15 TSH 2.43 continue Levothyroxine 12mcg po daily

## 2015-08-07 NOTE — Assessment & Plan Note (Signed)
05/19/14 MMSE 18/30, continue Namenda, SNF for care needs 

## 2015-08-07 NOTE — Assessment & Plan Note (Signed)
Stable, continue Senokot S II nightly, 07/23/15 MiraLax daily.

## 2015-08-07 NOTE — Assessment & Plan Note (Signed)
Exudative left pleural effusion-status post removal of 850 ml of pleural fluid Inflammation pleural fluid culture and cytology 04/16/15 CXR left basilar opacity likely representing infectious process, chronic chest findings are present. 07/07/15 CXR cardiomegaly, no acute pulmonary pathology.

## 2015-08-07 NOTE — Assessment & Plan Note (Signed)
03/27/15 Hgb 11.5 04/17/15 Hgb 11.2 07/10/15 Hgb 11.9

## 2015-08-07 NOTE — Assessment & Plan Note (Signed)
Continue Kcl 47meq daily, 04/17/15 K 3.7, 07/10/15 K 3.9

## 2015-08-07 NOTE — Assessment & Plan Note (Signed)
Controlled, continue Labetalol 100mg bid 

## 2015-08-09 ENCOUNTER — Encounter: Payer: Self-pay | Admitting: Nurse Practitioner

## 2015-08-21 DIAGNOSIS — R0602 Shortness of breath: Secondary | ICD-10-CM | POA: Diagnosis not present

## 2015-08-28 DIAGNOSIS — H353221 Exudative age-related macular degeneration, left eye, with active choroidal neovascularization: Secondary | ICD-10-CM | POA: Diagnosis not present

## 2015-09-03 ENCOUNTER — Non-Acute Institutional Stay (SKILLED_NURSING_FACILITY): Payer: Medicare Other | Admitting: Nurse Practitioner

## 2015-09-03 ENCOUNTER — Encounter: Payer: Self-pay | Admitting: Nurse Practitioner

## 2015-09-03 DIAGNOSIS — D649 Anemia, unspecified: Secondary | ICD-10-CM | POA: Diagnosis not present

## 2015-09-03 DIAGNOSIS — K5900 Constipation, unspecified: Secondary | ICD-10-CM

## 2015-09-03 DIAGNOSIS — K219 Gastro-esophageal reflux disease without esophagitis: Secondary | ICD-10-CM

## 2015-09-03 DIAGNOSIS — E876 Hypokalemia: Secondary | ICD-10-CM

## 2015-09-03 DIAGNOSIS — J948 Other specified pleural conditions: Secondary | ICD-10-CM

## 2015-09-03 DIAGNOSIS — E039 Hypothyroidism, unspecified: Secondary | ICD-10-CM

## 2015-09-03 DIAGNOSIS — G309 Alzheimer's disease, unspecified: Secondary | ICD-10-CM

## 2015-09-03 DIAGNOSIS — I5033 Acute on chronic diastolic (congestive) heart failure: Secondary | ICD-10-CM | POA: Diagnosis not present

## 2015-09-03 DIAGNOSIS — J90 Pleural effusion, not elsewhere classified: Secondary | ICD-10-CM

## 2015-09-03 DIAGNOSIS — F0280 Dementia in other diseases classified elsewhere without behavioral disturbance: Secondary | ICD-10-CM

## 2015-09-03 DIAGNOSIS — I10 Essential (primary) hypertension: Secondary | ICD-10-CM

## 2015-09-03 NOTE — Assessment & Plan Note (Signed)
Continue Kcl 47meq daily, 04/17/15 K 3.7, 07/10/15 K 3.9

## 2015-09-03 NOTE — Assessment & Plan Note (Signed)
TSH 2.188 09/2014, 05/29/15 TSH 2.43 continue Levothyroxine 131mcg po daily

## 2015-09-03 NOTE — Progress Notes (Signed)
Patient ID: Crystal Petersen, female   DOB: March 27, 1922, 82 y.o.   MRN: [id number]  Location:  [address] Room Number: 32 Place of Service:  SNF (31) Provider: Lennie Odor Farris Geiman NP  Alesia Richards, MD  Patient Care Team: Unk Pinto, MD as PCP - General (Internal Medicine) Hurman Horn, MD as Consulting Physician (Ophthalmology) Burnell Blanks, MD as Consulting Physician (Cardiology) Inda Castle, MD as Consulting Physician (Gastroenterology) Jari Pigg, MD as Consulting Physician (Dermatology) Dorna Leitz, MD as Consulting Physician (Orthopedic Surgery)  Extended Emergency Contact Information Primary Emergency Contact: [facility] Address: [address]          [place], [place] [address] Johnnette Litter of [place] Phone: [phone] Relation: Daughter Secondary Emergency Contact: Consepcion, Zeni [place], [place] [address] [place] Phone: [phone] Relation: Son  Code Status: DNR Goals of care: Advanced Directive information Advanced Directives 09/03/2015  Does patient have an advance directive? Yes  Type of Advance Directive Out of facility DNR (pink MOST or yellow form)  Does patient want to make changes to advanced directive? No - Patient declined  Copy of advanced directive(s) in chart? Yes     Chief Complaint  Patient presents with  . Medical Management of Chronic Issues    HPI:  Pt is a 82 y.o. female seen today for evaluation of chronic medical conditions: Hx of GERD asymptomatic, takes Famotidine 20mg  daily, last TSH wnl 09/2014 while on Levothyroxine 158mcg, controlled blood pressure while taking Labetalol 100mg  bid, taking Namenda for memory loss, she is functioning adequately in SNF. Denied pain, constipation, or trouble sleeping at night.     04/07/15-04/09/15 hospitalized for chest pain,  ABD and low back pain. She was found to have UTI, and was placed on Keflex. A Chest X-ray revealed a  Bilateral Pleuarl Effusions, with the left greater than right. s/p thorocentesis.   Past Medical History  Diagnosis Date  . Hypertension   . Nonspecific abnormal electrocardiogram (ECG) (EKG)   . Stricture and stenosis of esophagus   . Esophageal reflux   . Diverticulosis of colon (without mention of hemorrhage)   . Benign neoplasm of colon   . Macular degeneration   . Osteoarthritis     right hip  . Other and unspecified hyperlipidemia   . HTN (hypertension)   . Shingles   . Cystocele   . Gastroparesis   . Skin cancer (melanoma) ([place])   . Hypothyroidism   . Cancer ([place])     pre-melanoma  . Hemorrhoids   . Primary osteoarthritis of right hip 03/27/2014  . Acute blood loss anemia 03/29/2014    03/30/14 Hgb 7.3 04/04/14 Hgb 8.5 continue Fe bid.  05/04/14 Hgb 10.8   . Dementia of the Alzheimer's type 05/22/2014    05/19/14 MMSE 18/30 Namenda.    . Diastolic dysfunction [id number]  . Diverticulosis of large intestine 06/28/2004  . Essential hypertension 07/05/2013  . Hyperlipidemia 07/05/2013  . Internal hemorrhoids without mention of complication [id number]  . Prediabetes 11/17/2013  . Stricture and stenosis of esophagus 10/27/2007  . Vitamin D deficiency 11/17/2013   Past Surgical History  Procedure Laterality Date  . Cholecystectomy    . Gallbladder surgery  2002  . Hemorrhoid surgery    . Appendectomy    . Thyroidectomy    . Left eye surgery    . Cataract extraction, bilateral    . Melenoma resection  right arm  . Bcc resection      legs/face  . Tonsillectomy    . Flexible sigmoidoscopy  05/12/2011    Procedure: FLEXIBLE SIGMOIDOSCOPY;  Surgeon: Inda Castle, MD;  [facility]: [facility];  Service: Endoscopy;  Laterality: N/A;  . Flexible sigmoidoscopy  09/10/2011    Procedure: FLEXIBLE SIGMOIDOSCOPY;  Surgeon: Inda Castle, MD;  [facility];  Service: Endoscopy;  Laterality: N/A;  . Fracture surgery      left hip pin  . Flexible sigmoidoscopy N/A 08/16/2012     Procedure: FLEXIBLE SIGMOIDOSCOPY;  Surgeon: Inda Castle, MD;  [facility];  Service: Endoscopy;  Laterality: N/A;  . Hemorrhoid banding N/A 08/16/2012    Procedure: HEMORRHOID BANDING;  Surgeon: Inda Castle, MD;  [facility];  Service: Endoscopy;  Laterality: N/A;  . Total hip arthroplasty Right 03/27/2014    Procedure: TOTAL HIP ARTHROPLASTY ANTERIOR APPROACH;  Surgeon: Alta Corning, MD;  Location: [place];  Service: Orthopedics;  Laterality: Right;    Allergies  Allergen Reactions  . Ace Inhibitors Other (See Comments)    Elevated potassium  . Statins Other (See Comments)    Myalgias   . Vicodin [Hydrocodone-Acetaminophen] Other (See Comments)    "felt spaced out"  . Fosamax [Alendronate Sodium] Other (See Comments)  . Metoclopramide Hcl Other (See Comments)  . Sulfa Antibiotics Rash  . Sulfonamide Derivatives Rash  . Vesicare [Solifenacin] Other (See Comments)      Medication List       This list is accurate as of: 09/03/15  2:52 PM.  Always use your most recent med list.               acetaminophen 500 MG tablet  Commonly known as:  TYLENOL  Take 1,000 mg by mouth 3 (three) times daily as needed for mild pain or moderate pain.     aspirin EC 81 MG tablet  Take 81 mg by mouth daily.     CAL-GEST ANTACID 500 MG chewable tablet  Generic drug:  calcium carbonate  Chew 1 tablet by mouth daily as needed for indigestion or heartburn.     docusate sodium 100 MG capsule  Commonly known as:  COLACE  Take 100 mg by mouth 2 (two) times daily as needed for mild constipation.     famotidine 20 MG tablet  Commonly known as:  PEPCID  Take 20 mg by mouth at bedtime.     guaiFENesin 100 MG/5ML Soln  Commonly known as:  ROBITUSSIN  Take 10 mLs by mouth every 6 (six) hours as needed for cough or to loosen phlegm.     labetalol 100 MG tablet  Commonly known as:  NORMODYNE  Take 100 mg by mouth 2 (two) times daily.     levothyroxine 100 MCG  tablet  Commonly known as:  SYNTHROID, LEVOTHROID  Take 100 mcg by mouth daily before breakfast.     memantine 28 MG Cp24 24 hr capsule  Commonly known as:  NAMENDA XR  Take 28 mg by mouth daily.     OVER THE COUNTER MEDICATION  Take 5 mLs by mouth 4 (four) times daily -  before meals and at bedtime. Magic mouth wash     polyethylene glycol packet  Commonly known as:  MIRALAX / GLYCOLAX  Take 17 g by mouth daily.     potassium chloride SA 20 MEQ tablet  Commonly known as:  K-DUR,KLOR-CON  Take 40 mEq by mouth daily.  PRESERVISION AREDS 2 Caps  Take 1 capsule by mouth 2 (two) times daily.     protein supplement Powd  Take 1 scoop by mouth daily at 8 pm.     sennosides-docusate sodium 8.6-50 MG tablet  Commonly known as:  SENOKOT-S  Take 2 tablets by mouth at bedtime.     Vitamin D 2000 units tablet  Take 4,000 Units by mouth daily.        Review of Systems  Constitutional: Negative for fever and chills.  HENT: Positive for hearing loss. Negative for congestion, ear discharge and ear pain.   Eyes: Negative for pain, discharge and redness.       Lower eyelids redness, a sty seen right lower eyelids, mild ectropion noted, healed   Respiratory: Negative for cough, shortness of breath and wheezing.   Cardiovascular: Negative for chest pain and leg swelling.  Gastrointestinal: Negative for nausea, vomiting, abdominal pain and constipation.  Genitourinary: Positive for frequency. Negative for dysuria and urgency.  Musculoskeletal: Positive for back pain. Negative for myalgias and neck pain.  Skin: Negative for rash.  Neurological: Negative for dizziness, tremors, seizures, weakness and headaches.  Psychiatric/Behavioral: Negative for suicidal ideas and hallucinations. The patient is nervous/anxious.     Immunization History  Administered Date(s) Administered  . Influenza, High Dose Seasonal PF 12/27/2013  . Influenza-Unspecified 01/12/2013, 01/02/2015  .  Pneumococcal-Unspecified 07/01/2012  . Td 05/27/2004  . Tdap 02/21/2012  . Zoster 04/15/2007   Pertinent  Health Maintenance Due  Topic Date Due  . PNA vac Low Risk Adult (2 of 2 - PCV13) 07/01/2013  . INFLUENZA VACCINE  11/13/2015  . DEXA SCAN  Completed   Fall Risk  05/22/2014 10/06/2013 06/02/2013  Falls in the past year? No No No  Risk for fall due to : - History of fall(s);Impaired balance/gait;Impaired mobility Impaired mobility   Functional Status Survey:    Filed Vitals:   09/03/15 1149  BP: 144/90  Pulse: 74  Temp: 97.7 F (36.5 C)  TempSrc: Oral  Resp: 16  Height: 5\' 1"  (1.549 m)  Weight: 133 lb (60.328 kg)   Body mass index is 25.14 kg/(m^2). Physical Exam  Constitutional: She is oriented to person, place, and time. She appears well-developed and well-nourished. No distress.  HENT:  Head: Normocephalic and atraumatic.  Right Ear: External ear normal.  Mouth/Throat: No oropharyngeal exudate.  Eyes: Conjunctivae and EOM are normal. Pupils are equal, round, and reactive to light. Right eye exhibits no discharge. Left eye exhibits no discharge. No scleral icterus.  lower eyelids redness, mild ectropion noted   Neck: Normal range of motion. Neck supple. No JVD present. No tracheal deviation present. No thyromegaly present.  Cardiovascular: Normal rate, regular rhythm, normal heart sounds and intact distal pulses.   No murmur heard. Pulmonary/Chest: Effort normal and breath sounds normal. No respiratory distress. She has no wheezes. She has no rales. She exhibits no tenderness.  Abdominal: Soft. Bowel sounds are normal. She exhibits no distension and no mass. There is no tenderness.  Musculoskeletal: Normal range of motion. She exhibits tenderness. She exhibits no edema.  Right hip pain with ROM.  Neurological: She is alert and oriented to person, place, and time. She has normal reflexes. No cranial nerve deficit. She exhibits normal muscle tone. Coordination normal.    Skin: Skin is dry. No rash noted. She is not diaphoretic. No pallor.  Right hip surgical incision healed.   Psychiatric: Her behavior is normal. Judgment and thought content normal. Her mood  appears anxious. Her speech is not rapid and/or pressured, not delayed, not tangential and not slurred. Cognition and memory are impaired. She exhibits a depressed mood. She is communicative. She exhibits abnormal recent memory. She exhibits normal remote memory.    Labs reviewed:  Recent Labs  04/07/15 1635 04/08/15 0733 04/09/15 0500 04/17/15 07/10/15  NA 139 138 138 138 141  K 4.2 3.8 3.2* 3.7 3.9  CL 104 98* 102  --   --   CO2 25 27 25   --   --   GLUCOSE 102* 146* 91  --   --   BUN 17 12 19 16 19   CREATININE 0.75 0.76 0.74 0.8 0.8  CALCIUM 8.9 8.9 8.5*  --   --     Recent Labs  04/07/15 1635 04/09/15 0500 04/17/15 07/10/15  AST 16 13* 15 12*  ALT 9* 8* 9 7  ALKPHOS 81 69 63 55  BILITOT 0.7 0.5  --   --   PROT 6.2* 5.3*  --   --   ALBUMIN 3.0* 2.8*  --   --     Recent Labs  04/07/15 1635 04/08/15 0733 04/09/15 0500 04/17/15 07/10/15  WBC 8.5 13.7* 9.7 7.7 6.2  NEUTROABS 5.7  --   --   --   --   HGB 12.0 13.1 11.1* 11.2* 11.9*  HCT 38.1 41.1 35.2* 34* 36  MCV 91.6 90.9 90.3  --   --   PLT 378 436* 371 338 231   Lab Results  Component Value Date   TSH 2.43 05/29/2015   Lab Results  Component Value Date   HGBA1C 5.8* 07/06/2014   Lab Results  Component Value Date   CHOL 209* 07/06/2014   HDL 66 07/06/2014   LDLCALC 111* 07/06/2014   TRIG 160* 07/06/2014   CHOLHDL 3.2 07/06/2014    Significant Diagnostic Results in last 30 days:  No results found.  Assessment/Plan: Pleural effusion, left Exudative left pleural effusion-status post removal of 850 ml of pleural fluid Inflammation pleural fluid culture and cytology 04/16/15 CXR left basilar opacity likely representing infectious process, chronic chest findings are present. 07/07/15 CXR cardiomegaly, no acute  pulmonary pathology. 08/21/15 CXR no acute cardiopulmonary       Hypothyroidism TSH 2.188 09/2014, 05/29/15 TSH 2.43 continue Levothyroxine 113mcg po daily   Hypokalemia Continue Kcl 68meq daily, 04/17/15 K 3.7, 07/10/15 K 3.9   GERD Stable, continue Famotidine 20mg  daily.   Essential hypertension Controlled, continue Labetalol 100mg  bid.    Diastolic CHF, acute on chronic (HCC) Clinically compensated. 07/10/15 Na 141, K 3.9, Bun 19, creat 0.79 08/21/15 CXR no acute cardiopulmonary      Dementia of the Alzheimer's type 05/19/14 MMSE 18/30, continue Namenda, SNF for care needs    Constipation Stable, continue Senokot S II nightly, 07/23/15 MiraLax daily.    Anemia 03/27/15 Hgb 11.5 04/17/15 Hgb 11.2 07/10/15 Hgb 11.9      Family/ staff Communication: continue SNF for care needs  Labs/tests ordered:  none

## 2015-09-03 NOTE — Assessment & Plan Note (Signed)
05/19/14 MMSE 18/30, continue Namenda, SNF for care needs 

## 2015-09-03 NOTE — Assessment & Plan Note (Signed)
Exudative left pleural effusion-status post removal of 850 ml of pleural fluid Inflammation pleural fluid culture and cytology 04/16/15 CXR left basilar opacity likely representing infectious process, chronic chest findings are present. 07/07/15 CXR cardiomegaly, no acute pulmonary pathology. 08/21/15 CXR no acute cardiopulmonary  

## 2015-09-03 NOTE — Assessment & Plan Note (Signed)
Controlled, continue Labetalol 100mg bid 

## 2015-09-03 NOTE — Assessment & Plan Note (Signed)
Clinically compensated. 07/10/15 Na 141, K 3.9, Bun 19, creat 0.79 08/21/15 CXR no acute cardiopulmonary

## 2015-09-03 NOTE — Assessment & Plan Note (Signed)
03/27/15 Hgb 11.5 04/17/15 Hgb 11.2 07/10/15 Hgb 11.9

## 2015-09-03 NOTE — Assessment & Plan Note (Signed)
Stable, continue Senokot S II nightly, 07/23/15 MiraLax daily.

## 2015-09-03 NOTE — Assessment & Plan Note (Signed)
Stable, continue Famotidine 20mg daily.   

## 2015-10-09 DIAGNOSIS — H353221 Exudative age-related macular degeneration, left eye, with active choroidal neovascularization: Secondary | ICD-10-CM | POA: Diagnosis not present

## 2015-10-09 DIAGNOSIS — H353134 Nonexudative age-related macular degeneration, bilateral, advanced atrophic with subfoveal involvement: Secondary | ICD-10-CM | POA: Diagnosis not present

## 2015-11-22 ENCOUNTER — Encounter: Payer: Self-pay | Admitting: Internal Medicine

## 2015-11-22 ENCOUNTER — Non-Acute Institutional Stay (SKILLED_NURSING_FACILITY): Payer: Medicare Other | Admitting: Internal Medicine

## 2015-11-22 DIAGNOSIS — I10 Essential (primary) hypertension: Secondary | ICD-10-CM | POA: Diagnosis not present

## 2015-11-22 DIAGNOSIS — J90 Pleural effusion, not elsewhere classified: Secondary | ICD-10-CM

## 2015-11-22 DIAGNOSIS — E039 Hypothyroidism, unspecified: Secondary | ICD-10-CM | POA: Diagnosis not present

## 2015-11-22 DIAGNOSIS — D649 Anemia, unspecified: Secondary | ICD-10-CM

## 2015-11-22 DIAGNOSIS — F0280 Dementia in other diseases classified elsewhere without behavioral disturbance: Secondary | ICD-10-CM

## 2015-11-22 DIAGNOSIS — E876 Hypokalemia: Secondary | ICD-10-CM

## 2015-11-22 DIAGNOSIS — J948 Other specified pleural conditions: Secondary | ICD-10-CM

## 2015-11-22 DIAGNOSIS — G309 Alzheimer's disease, unspecified: Secondary | ICD-10-CM | POA: Diagnosis not present

## 2015-11-22 NOTE — Progress Notes (Signed)
Location:   [place] Room Number: [place] of Service:  SNF [phone]) Provider:  Jeanmarie Hubert, MD  Patient Care Team: Estill Dooms, MD as PCP - General (Internal Medicine) Hurman Horn, MD as Consulting Physician (Ophthalmology) Burnell Blanks, MD as Consulting Physician (Cardiology) Inda Castle, MD as Consulting Physician (Gastroenterology) Jari Pigg, MD as Consulting Physician (Dermatology) Dorna Leitz, MD as Consulting Physician (Orthopedic Surgery) Man Otho Darner, NP as Nurse Practitioner (Internal Medicine)  Extended Emergency Contact Information Primary Emergency Contact: [facility] Address: [address]          [place], [place] [address] Johnnette Litter of [place] Phone: [phone] Relation: Daughter Secondary Emergency Contact: Zanya, [place], [place] [address] [place] of [place] Phone: [phone] Relation: Son  Code Status: DNR Goals of care: Advanced Directive information Advanced Directives 11/22/2015  Does patient have an advance directive? Yes  Type of Advance Directive Out of facility DNR (pink MOST or yellow form)  Does patient want to make changes to advanced directive? -  Copy of advanced directive(s) in chart? Yes  Pre-existing out of facility DNR order (yellow form or pink MOST form) -     Chief Complaint  Patient presents with  . Medical Management of Chronic Issues    routine medication management    HPI:  Pt is a 82 y.o. female seen today for medical management of chronic diseases.    Dementia of the Alzheimer's type - unchanged. Using memantine.  Anemia, unspecified anemia type - Resolved. Off iron.  Essential hypertension - dontrolled  Hypothyroidism, unspecified hypothyroidism type - needs lab follow up  Hypokalemia - resolved  Pleural effusion, left - resolved     Past Medical History:  Diagnosis Date  . Acute blood loss anemia 03/29/2014   03/30/14 Hgb 7.3 04/04/14  Hgb 8.5 continue Fe bid.  05/04/14 Hgb 10.8   . Benign neoplasm of colon   . Cancer ([place])    pre-melanoma  . Cystocele   . Dementia of the Alzheimer's type 05/22/2014   05/19/14 MMSE 18/30 Namenda.    . Diastolic dysfunction [id number]  . Diverticulosis of colon (without mention of hemorrhage)   . Diverticulosis of large intestine 06/28/2004  . Esophageal reflux   . Essential hypertension 07/05/2013  . Gastroparesis   . Hemorrhoids   . HTN (hypertension)   . Hyperlipidemia 07/05/2013  . Hypertension   . Hypothyroidism   . Internal hemorrhoids without mention of complication [id number]  . Macular degeneration   . Nonspecific abnormal electrocardiogram (ECG) (EKG)   . Osteoarthritis    right hip  . Other and unspecified hyperlipidemia   . Prediabetes 11/17/2013  . Primary osteoarthritis of right hip 03/27/2014  . Shingles   . Skin cancer (melanoma) ([place])   . Stricture and stenosis of esophagus   . Stricture and stenosis of esophagus 10/27/2007  . Vitamin D deficiency 11/17/2013   Past Surgical History:  Procedure Laterality Date  . APPENDECTOMY    . BCC resection     legs/face  . CATARACT EXTRACTION, BILATERAL    . CHOLECYSTECTOMY    . FLEXIBLE SIGMOIDOSCOPY  05/12/2011   Procedure: FLEXIBLE SIGMOIDOSCOPY;  Surgeon: Inda Castle, MD;  [facility];  Service: Endoscopy;  Laterality: N/A;  . FLEXIBLE SIGMOIDOSCOPY  09/10/2011   Procedure: FLEXIBLE SIGMOIDOSCOPY;  Surgeon: Inda Castle, MD;  [facility];  Service: Endoscopy;  Laterality: N/A;  . FLEXIBLE  SIGMOIDOSCOPY N/A 08/16/2012   Procedure: FLEXIBLE SIGMOIDOSCOPY;  Surgeon: Inda Castle, MD;  [facility];  Service: Endoscopy;  Laterality: N/A;  . FRACTURE SURGERY     left hip pin  . GALLBLADDER SURGERY  2002  . HEMORRHOID BANDING N/A 08/16/2012   Procedure: HEMORRHOID BANDING;  Surgeon: Inda Castle, MD;  [facility];  Service: Endoscopy;  Laterality: N/A;  . HEMORRHOID SURGERY    .  left eye surgery    . melenoma resection     right arm  . THYROIDECTOMY    . TONSILLECTOMY    . TOTAL HIP ARTHROPLASTY Right 03/27/2014   Procedure: TOTAL HIP ARTHROPLASTY ANTERIOR APPROACH;  Surgeon: Alta Corning, MD;  Location: [place];  Service: Orthopedics;  Laterality: Right;    Allergies  Allergen Reactions  . Ace Inhibitors Other (See Comments)    Elevated potassium  . Statins Other (See Comments)    Myalgias   . Vicodin [Hydrocodone-Acetaminophen] Other (See Comments)    "felt spaced out"  . Fosamax [Alendronate Sodium] Other (See Comments)  . Metoclopramide Hcl Other (See Comments)  . Sulfa Antibiotics Rash  . Sulfonamide Derivatives Rash  . Vesicare [Solifenacin] Other (See Comments)      Medication List       Accurate as of 11/22/15 10:04 AM. Always use your most recent med list.          acetaminophen 500 MG tablet Commonly known as:  TYLENOL Take 1,000 mg by mouth 3 (three) times daily as needed for mild pain or moderate pain.   aspirin EC 81 MG tablet Take 81 mg by mouth daily.   CAL-GEST ANTACID 500 MG chewable tablet Generic drug:  calcium carbonate Chew 1 tablet by mouth daily as needed for indigestion or heartburn.   docusate sodium 100 MG capsule Commonly known as:  COLACE Take 100 mg by mouth 2 (two) times daily as needed for mild constipation.   famotidine 20 MG tablet Commonly known as:  PEPCID Take 20 mg by mouth at bedtime.   guaiFENesin 100 MG/5ML Soln Commonly known as:  ROBITUSSIN Take 10 mLs by mouth every 6 (six) hours as needed for cough or to loosen phlegm.   labetalol 100 MG tablet Commonly known as:  NORMODYNE Take 100 mg by mouth 2 (two) times daily.   levothyroxine 100 MCG tablet Commonly known as:  SYNTHROID, LEVOTHROID Take 100 mcg by mouth daily before breakfast.   memantine 28 MG Cp24 24 hr capsule Commonly known as:  NAMENDA XR Take 28 mg by mouth daily.   polyethylene glycol packet Commonly known as:  MIRALAX  / GLYCOLAX Take 17 g by mouth daily.   potassium chloride SA 20 MEQ tablet Commonly known as:  K-DUR,KLOR-CON Take 40 mEq by mouth daily.   PRESERVISION AREDS 2 Caps Take 1 capsule by mouth 2 (two) times daily.   protein supplement Powd Take 1 scoop by mouth. 1 scoop twice a day due to low albumin   sennosides-docusate sodium 8.6-50 MG tablet Commonly known as:  SENOKOT-S Take 2 tablets by mouth at bedtime.   Vitamin D 2000 units tablet Take 4,000 Units by mouth daily.       Review of Systems  Constitutional: Negative for chills and fever.  HENT: Positive for hearing loss. Negative for congestion, ear discharge and ear pain.   Eyes: Negative for pain, discharge and redness.       Lower eyelids redness, a sty seen right lower eyelids, mild ectropion  noted, healed.  Respiratory: Negative for cough, shortness of breath and wheezing.   Cardiovascular: Negative for chest pain and leg swelling.  Gastrointestinal: Negative for abdominal pain, constipation, nausea and vomiting.  Genitourinary: Positive for frequency. Negative for dysuria and urgency.  Musculoskeletal: Positive for back pain and gait problem. Negative for myalgias and neck pain.  Skin: Negative for rash.  Neurological: Negative for dizziness, tremors, seizures, weakness and headaches.       Alzheimer's  Hematological:       Hx anemia  Psychiatric/Behavioral: Positive for confusion and decreased concentration. Negative for hallucinations and suicidal ideas. The patient is nervous/anxious.     Immunization History  Administered Date(s) Administered  . Influenza, High Dose Seasonal PF 12/27/2013  . Influenza-Unspecified 01/12/2013, 01/02/2015  . Pneumococcal-Unspecified 07/01/2012  . Td 05/27/2004  . Tdap 02/21/2012  . Zoster 04/15/2007   Pertinent  Health Maintenance Due  Topic Date Due  . PNA vac Low Risk Adult (2 of 2 - PCV13) 07/01/2013  . INFLUENZA VACCINE  11/13/2015  . DEXA SCAN  Completed   Fall  Risk  05/22/2014 10/06/2013 06/02/2013  Falls in the past year? No No No  Risk for fall due to : - History of fall(s);Impaired balance/gait;Impaired mobility Impaired mobility   Functional Status Survey:    Vitals:   11/22/15 0943  BP: 140/80  Pulse: 70  Resp: 18  Temp: 98.6 F (37 C)  Weight: 136 lb (61.7 kg)  Height: 5\' 1"  (1.549 m)   Body mass index is 25.7 kg/m. Physical Exam  Constitutional: She appears well-developed and well-nourished. No distress.  HENT:  Head: Normocephalic and atraumatic.  Right Ear: External ear normal.  Mouth/Throat: No oropharyngeal exudate.  Eyes: Conjunctivae and EOM are normal. Pupils are equal, round, and reactive to light. Right eye exhibits no discharge. Left eye exhibits no discharge. No scleral icterus.  lower eyelids redness, mild ectropion noted   Neck: Normal range of motion. Neck supple. No JVD present. No tracheal deviation present. No thyromegaly present.  Cardiovascular: Normal rate, regular rhythm, normal heart sounds and intact distal pulses.   No murmur heard. Pulmonary/Chest: Effort normal and breath sounds normal. No respiratory distress. She has no wheezes. She has no rales. She exhibits no tenderness.  Abdominal: Soft. Bowel sounds are normal. She exhibits no distension and no mass. There is no tenderness.  Musculoskeletal: Normal range of motion. She exhibits tenderness. She exhibits no edema.  Right hip pain with ROM.  Neurological: She is alert. She has normal reflexes. No cranial nerve deficit. She exhibits normal muscle tone. Coordination normal.  dementia  Skin: Skin is dry. No rash noted. She is not diaphoretic. No pallor.  Right hip surgical incision healed.   Psychiatric: She has a normal mood and affect. Her behavior is normal. Thought content normal. Her speech is not rapid and/or pressured, not delayed, not tangential and not slurred. Cognition and memory are impaired. She is communicative. She exhibits abnormal recent  memory. She exhibits normal remote memory.    Labs reviewed:  Recent Labs  04/07/15 1635 04/08/15 0733 04/09/15 0500 04/17/15 07/10/15  NA 139 138 138 138 141  K 4.2 3.8 3.2* 3.7 3.9  CL 104 98* 102  --   --   CO2 25 27 25   --   --   GLUCOSE 102* 146* 91  --   --   BUN 17 12 19 16 19   CREATININE 0.75 0.76 0.74 0.8 0.8  CALCIUM 8.9 8.9 8.5*  --   --  Recent Labs  04/07/15 1635 04/09/15 0500 04/17/15 07/10/15  AST 16 13* 15 12*  ALT 9* 8* 9 7  ALKPHOS 81 69 63 55  BILITOT 0.7 0.5  --   --   PROT 6.2* 5.3*  --   --   ALBUMIN 3.0* 2.8*  --   --     Recent Labs  04/07/15 1635 04/08/15 0733 04/09/15 0500 04/17/15 07/10/15  WBC 8.5 13.7* 9.7 7.7 6.2  NEUTROABS 5.7  --   --   --   --   HGB 12.0 13.1 11.1* 11.2* 11.9*  HCT 38.1 41.1 35.2* 34* 36  MCV 91.6 90.9 90.3  --   --   PLT 378 436* 371 338 231   Lab Results  Component Value Date   TSH 2.43 05/29/2015   Lab Results  Component Value Date   HGBA1C 5.8 (H) 07/06/2014   Lab Results  Component Value Date   CHOL 209 (H) 07/06/2014   HDL 66 07/06/2014   LDLCALC 111 (H) 07/06/2014   TRIG 160 (H) 07/06/2014   CHOLHDL 3.2 07/06/2014    Significant Diagnostic Results in last 30 days:  No results found.  Assessment/Plan  1. Dementia of the Alzheimer's type Continue memantine  2. Anemia, unspecified anemia type -CBC  3. Essential hypertension Continue labetalol -CMP  4. Hypothyroidism, unspecified hypothyroidism type -TSH  5. Hypokalemia Resolved -CMP  6. Pleural effusion, left Resolved

## 2015-11-27 DIAGNOSIS — E039 Hypothyroidism, unspecified: Secondary | ICD-10-CM | POA: Diagnosis not present

## 2015-11-27 DIAGNOSIS — I10 Essential (primary) hypertension: Secondary | ICD-10-CM | POA: Diagnosis not present

## 2015-11-27 DIAGNOSIS — R2681 Unsteadiness on feet: Secondary | ICD-10-CM | POA: Diagnosis not present

## 2015-11-27 LAB — HEPATIC FUNCTION PANEL
ALT: 7 U/L (ref 7–35)
AST: 12 U/L — AB (ref 13–35)
Alkaline Phosphatase: 57 U/L (ref 25–125)
Bilirubin, Total: 0.5 mg/dL

## 2015-11-27 LAB — CBC AND DIFFERENTIAL
HCT: 37 % (ref 36–46)
HEMOGLOBIN: 12.4 g/dL (ref 12.0–16.0)
Platelets: 255 10*3/uL (ref 150–399)
WBC: 7.5 10*3/mL

## 2015-11-27 LAB — BASIC METABOLIC PANEL
BUN: 20 mg/dL (ref 4–21)
CREATININE: 0.6 mg/dL (ref <=1.1)
GLUCOSE: 72 mg/dL
POTASSIUM: 3.6 mmol/L (ref 3.4–5.3)
SODIUM: 141 mmol/L (ref 137–147)

## 2015-11-27 LAB — TSH: TSH: 0.31 u[IU]/mL — AB (ref <=5.90)

## 2016-01-01 DIAGNOSIS — H353221 Exudative age-related macular degeneration, left eye, with active choroidal neovascularization: Secondary | ICD-10-CM | POA: Diagnosis not present

## 2016-01-04 ENCOUNTER — Non-Acute Institutional Stay (SKILLED_NURSING_FACILITY): Payer: Medicare Other | Admitting: Nurse Practitioner

## 2016-01-04 ENCOUNTER — Encounter: Payer: Self-pay | Admitting: Nurse Practitioner

## 2016-01-04 DIAGNOSIS — E876 Hypokalemia: Secondary | ICD-10-CM | POA: Diagnosis not present

## 2016-01-04 DIAGNOSIS — F0280 Dementia in other diseases classified elsewhere without behavioral disturbance: Secondary | ICD-10-CM

## 2016-01-04 DIAGNOSIS — G309 Alzheimer's disease, unspecified: Secondary | ICD-10-CM

## 2016-01-04 DIAGNOSIS — I10 Essential (primary) hypertension: Secondary | ICD-10-CM | POA: Diagnosis not present

## 2016-01-04 DIAGNOSIS — D649 Anemia, unspecified: Secondary | ICD-10-CM

## 2016-01-04 DIAGNOSIS — K5900 Constipation, unspecified: Secondary | ICD-10-CM

## 2016-01-04 DIAGNOSIS — I519 Heart disease, unspecified: Secondary | ICD-10-CM

## 2016-01-04 DIAGNOSIS — E039 Hypothyroidism, unspecified: Secondary | ICD-10-CM | POA: Diagnosis not present

## 2016-01-04 DIAGNOSIS — I5033 Acute on chronic diastolic (congestive) heart failure: Secondary | ICD-10-CM | POA: Diagnosis not present

## 2016-01-04 DIAGNOSIS — K222 Esophageal obstruction: Secondary | ICD-10-CM

## 2016-01-04 DIAGNOSIS — I5189 Other ill-defined heart diseases: Secondary | ICD-10-CM

## 2016-01-04 NOTE — Assessment & Plan Note (Signed)
TSH 0.31 11/27/15, decreased Levothyroxine to 58mcg qd, scheduled TSH

## 2016-01-04 NOTE — Assessment & Plan Note (Addendum)
Stable, continue Senokot S II nightly, MiraLax qod

## 2016-01-04 NOTE — Assessment & Plan Note (Signed)
05/19/14 MMSE 18/30, continue Namenda, SNF for care needs 

## 2016-01-04 NOTE — Assessment & Plan Note (Addendum)
Controlled, continue Labetalol 100mg  bid. 11/27/15 TSH 0.31, Hgb 12.4, Na 141, K 3.6, Bun 20, creat 0.64, TP 5.6, albumin 3.5

## 2016-01-04 NOTE — Assessment & Plan Note (Signed)
11/27/15 Hgb 12.4

## 2016-01-04 NOTE — Assessment & Plan Note (Signed)
Clinically compensated.  08/21/15 CXR no acute cardiopulmonary  11/27/15 TSH 0.31, Hgb 12.4, Na 141, K 3.6, Bun 20, creat 0.64, TP 5.6, albumin 3.5

## 2016-01-04 NOTE — Progress Notes (Signed)
Location:  [facility] Room Number: 73 Place of Service:  SNF (31) Provider: Clarissa Laird, [place]xie  NP  Jeanmarie Hubert, MD  Patient Care Team: Estill Dooms, MD as PCP - General (Internal Medicine) Hurman Horn, MD as Consulting Physician (Ophthalmology) Burnell Blanks, MD as Consulting Physician (Cardiology) Inda Castle, MD as Consulting Physician (Gastroenterology) Jari Pigg, MD as Consulting Physician (Dermatology) Dorna Leitz, MD as Consulting Physician (Orthopedic Surgery) Percell Lamboy Otho Darner, NP as Nurse Practitioner (Internal Medicine)  Extended Emergency Contact Information Primary Emergency Contact: [facility] Address: [address]          [place], [place] [address] Johnnette Litter of [place] Phone: [phone] Relation: Daughter Secondary Emergency Contact: Zeyah, [place], [place] [address] Johnnette Litter of [place] Phone: [phone] Relation: Son  Code Status:  DNR Goals of care: Advanced Directive information Advanced Directives 01/04/2016  Does patient have an advance directive? Yes  Type of Advance Directive Out of facility DNR (pink MOST or yellow form)  Does patient want to make changes to advanced directive? No - Patient declined  Copy of advanced directive(s) in chart? Yes  Pre-existing out of facility DNR order (yellow form or pink MOST form) -     Chief Complaint  Patient presents with  . Medical Management of Chronic Issues    HPI:  Pt is a 82 y.o. female seen today for medical management of chronic diseases.    Hx of GERD asymptomatic, takes Famotidine 20mg  daily, last TSH 0.31 8/151/7, decreased Levothyroxine to 61mcg, controlled blood pressure while taking Labetalol 100mg  bid, taking Namenda for memory loss, she is functioning adequately in SNF. Denied pain, constipation, or trouble sleeping at night. 04/07/15-04/09/15 hospitalized, A Chest X-ray revealed a Bilateral Pleuarl Effusions, with the left greater  than right. s/p thorocentesis.   Past Medical History:  Diagnosis Date  . Acute blood loss anemia 03/29/2014   03/30/14 Hgb 7.3 04/04/14 Hgb 8.5 continue Fe bid.  05/04/14 Hgb 10.8   . Benign neoplasm of colon   . Cancer ([place])    pre-melanoma  . Cystocele   . Dementia of the Alzheimer's type 05/22/2014   05/19/14 MMSE 18/30 Namenda.    . Diastolic dysfunction [id number]  . Diverticulosis of colon (without mention of hemorrhage)   . Diverticulosis of large intestine 06/28/2004  . Esophageal reflux   . Essential hypertension 07/05/2013  . Gastroparesis   . Hemorrhoids   . HTN (hypertension)   . Hyperlipidemia 07/05/2013  . Hypertension   . Hypothyroidism   . Internal hemorrhoids without mention of complication [id number]  . Macular degeneration   . Nonspecific abnormal electrocardiogram (ECG) (EKG)   . Osteoarthritis    right hip  . Other and unspecified hyperlipidemia   . Prediabetes 11/17/2013  . Primary osteoarthritis of right hip 03/27/2014  . Shingles   . Skin cancer (melanoma) ([place])   . Stricture and stenosis of esophagus   . Stricture and stenosis of esophagus 10/27/2007  . Vitamin D deficiency 11/17/2013   Past Surgical History:  Procedure Laterality Date  . APPENDECTOMY    . BCC resection     legs/face  . CATARACT EXTRACTION, BILATERAL    . CHOLECYSTECTOMY    . FLEXIBLE SIGMOIDOSCOPY  05/12/2011   Procedure: FLEXIBLE SIGMOIDOSCOPY;  Surgeon: Inda Castle, MD;  Location: [facility];  Service: Endoscopy;  Laterality: N/A;  . FLEXIBLE SIGMOIDOSCOPY  09/10/2011   Procedure: FLEXIBLE SIGMOIDOSCOPY;  Surgeon:  Inda Castle, MD;  Location: [facility];  Service: Endoscopy;  Laterality: N/A;  . FLEXIBLE SIGMOIDOSCOPY N/A 08/16/2012   Procedure: FLEXIBLE SIGMOIDOSCOPY;  Surgeon: Inda Castle, MD;  Location: [facility];  Service: Endoscopy;  Laterality: N/A;  . FRACTURE SURGERY     left hip pin  . GALLBLADDER SURGERY  2002  . HEMORRHOID BANDING N/A 08/16/2012   Procedure:  HEMORRHOID BANDING;  Surgeon: Inda Castle, MD;  Location: [facility];  Service: Endoscopy;  Laterality: N/A;  . HEMORRHOID SURGERY    . left eye surgery    . melenoma resection     right arm  . THYROIDECTOMY    . TONSILLECTOMY    . TOTAL HIP ARTHROPLASTY Right 03/27/2014   Procedure: TOTAL HIP ARTHROPLASTY ANTERIOR APPROACH;  Surgeon: Alta Corning, MD;  Location: [facility];  Service: Orthopedics;  Laterality: Right;    Allergies  Allergen Reactions  . Ace Inhibitors Other (See Comments)    Elevated potassium  . Statins Other (See Comments)    Myalgias   . Vicodin [Hydrocodone-Acetaminophen] Other (See Comments)    "felt spaced out"  . Fosamax [Alendronate Sodium] Other (See Comments)  . Metoclopramide Hcl Other (See Comments)  . Sulfa Antibiotics Rash  . Sulfonamide Derivatives Rash  . Vesicare [Solifenacin] Other (See Comments)      Medication List       Accurate as of 01/04/16  2:33 PM. Always use your most recent med list.          acetaminophen 500 MG tablet Commonly known as:  TYLENOL Take 1,000 mg by mouth 3 (three) times daily as needed for mild pain or moderate pain.   aspirin EC 81 MG tablet Take 81 mg by mouth daily.   CAL-GEST ANTACID 500 MG chewable tablet Generic drug:  calcium carbonate Chew 1 tablet by mouth daily as needed for indigestion or heartburn.   docusate sodium 100 MG capsule Commonly known as:  COLACE Take 100 mg by mouth 2 (two) times daily as needed for mild constipation.   famotidine 20 MG tablet Commonly known as:  PEPCID Take 20 mg by mouth at bedtime.   guaiFENesin 100 MG/5ML Soln Commonly known as:  ROBITUSSIN Take 10 mLs by mouth every 6 (six) hours as needed for cough or to loosen phlegm.   labetalol 100 MG tablet Commonly known as:  NORMODYNE Take 100 mg by mouth 2 (two) times daily.   levothyroxine 75 MCG tablet Commonly known as:  SYNTHROID, LEVOTHROID Take 75 mcg by mouth daily before breakfast.   memantine 28  MG Cp24 24 hr capsule Commonly known as:  NAMENDA XR Take 28 mg by mouth daily.   polyethylene glycol packet Commonly known as:  MIRALAX / GLYCOLAX Take 17 g by mouth every other day.   PRESERVISION AREDS 2 Caps Take 1 capsule by mouth 2 (two) times daily.   protein supplement Powd Take 1 scoop by mouth. 1 scoop twice a day due to low albumin   sennosides-docusate sodium 8.6-50 MG tablet Commonly known as:  SENOKOT-S Take 2 tablets by mouth at bedtime.   Vitamin D 2000 units tablet Take 4,000 Units by mouth daily.       Review of Systems  Constitutional: Negative for chills and fever.  HENT: Positive for hearing loss. Negative for congestion, ear discharge and ear pain.   Eyes: Negative for pain, discharge and redness.       Lower eyelids redness, a sty seen right lower eyelids, mild  ectropion noted, healed.  Respiratory: Negative for cough, shortness of breath and wheezing.   Cardiovascular: Negative for chest pain and leg swelling.  Gastrointestinal: Negative for abdominal pain, constipation, nausea and vomiting.  Genitourinary: Positive for frequency. Negative for dysuria and urgency.  Musculoskeletal: Positive for back pain and gait problem. Negative for myalgias and neck pain.  Skin: Negative for rash.  Neurological: Negative for dizziness, tremors, seizures, weakness and headaches.       Alzheimer's  Hematological:       Hx anemia  Psychiatric/Behavioral: Positive for confusion and decreased concentration. Negative for hallucinations and suicidal ideas. The patient is nervous/anxious.     Immunization History  Administered Date(s) Administered  . Influenza, High Dose Seasonal PF 12/27/2013  . Influenza-Unspecified 01/12/2013, 01/02/2015  . Pneumococcal-Unspecified 07/01/2012  . Td 05/27/2004  . Tdap 02/21/2012  . Zoster 04/15/2007   Pertinent  Health Maintenance Due  Topic Date Due  . PNA vac Low Risk Adult (2 of 2 - PCV13) 07/01/2013  . INFLUENZA VACCINE   11/13/2015  . DEXA SCAN  Completed   Fall Risk  05/22/2014 10/06/2013 06/02/2013  Falls in the past year? No No No  Risk for fall due to : - History of fall(s);Impaired balance/gait;Impaired mobility Impaired mobility   Functional Status Survey:    Vitals:   01/04/16 1341  BP: 134/82  Pulse: 78  Resp: 18  Temp: 97 F (36.1 C)  Weight: 138 lb (62.6 kg)  Height: 5\' 1"  (1.549 m)   Body mass index is 26.07 kg/m. Physical Exam  Constitutional: She appears well-developed and well-nourished. No distress.  HENT:  Head: Normocephalic and atraumatic.  Right Ear: External ear normal.  Mouth/Throat: No oropharyngeal exudate.  Eyes: Conjunctivae and EOM are normal. Pupils are equal, round, and reactive to light. Right eye exhibits no discharge. Left eye exhibits no discharge. No scleral icterus.  lower eyelids redness, mild ectropion noted   Neck: Normal range of motion. Neck supple. No JVD present. No tracheal deviation present. No thyromegaly present.  Cardiovascular: Normal rate, regular rhythm, normal heart sounds and intact distal pulses.   No murmur heard. Pulmonary/Chest: Effort normal and breath sounds normal. No respiratory distress. She has no wheezes. She has no rales. She exhibits no tenderness.  Abdominal: Soft. Bowel sounds are normal. She exhibits no distension and no mass. There is no tenderness.  Musculoskeletal: Normal range of motion. She exhibits tenderness. She exhibits no edema.  Right hip pain with ROM.  Neurological: She is alert. She has normal reflexes. No cranial nerve deficit. She exhibits normal muscle tone. Coordination normal.  dementia  Skin: Skin is dry. No rash noted. She is not diaphoretic. No pallor.  Right hip surgical incision healed.   Psychiatric: She has a normal mood and affect. Her behavior is normal. Thought content normal. Her speech is not rapid and/or pressured, not delayed, not tangential and not slurred. Cognition and memory are impaired. She  is communicative. She exhibits abnormal recent memory. She exhibits normal remote memory.    Labs reviewed:  Recent Labs  04/07/15 1635 04/08/15 0733 04/09/15 0500 04/17/15 07/10/15 11/27/15  NA 139 138 138 138 141 141  K 4.2 3.8 3.2* 3.7 3.9 3.6  CL 104 98* 102  --   --   --   CO2 25 27 25   --   --   --   GLUCOSE 102* 146* 91  --   --   --   BUN 17 12 19 16 19  20  CREATININE 0.75 0.76 0.74 0.8 0.8 0.6  CALCIUM 8.9 8.9 8.5*  --   --   --     Recent Labs  04/07/15 1635 04/09/15 0500 04/17/15 07/10/15 11/27/15  AST 16 13* 15 12* 12*  ALT 9* 8* 9 7 7   ALKPHOS 81 69 63 55 57  BILITOT 0.7 0.5  --   --   --   PROT 6.2* 5.3*  --   --   --   ALBUMIN 3.0* 2.8*  --   --   --     Recent Labs  04/07/15 1635 04/08/15 0733 04/09/15 0500 04/17/15 07/10/15 11/27/15  WBC 8.5 13.7* 9.7 7.7 6.2 7.5  NEUTROABS 5.7  --   --   --   --   --   HGB 12.0 13.1 11.1* 11.2* 11.9* 12.4  HCT 38.1 41.1 35.2* 34* 36 37  MCV 91.6 90.9 90.3  --   --   --   PLT 378 436* 371 338 231 255   Lab Results  Component Value Date   TSH 0.31 (A) 11/27/2015   Lab Results  Component Value Date   HGBA1C 5.8 (H) 07/06/2014   Lab Results  Component Value Date   CHOL 209 (H) 07/06/2014   HDL 66 07/06/2014   LDLCALC 111 (H) 07/06/2014   TRIG 160 (H) 07/06/2014   CHOLHDL 3.2 07/06/2014    Significant Diagnostic Results in last 30 days:  No results found.  Assessment/Plan There are no diagnoses linked to this encounter.Essential hypertension Controlled, continue Labetalol 100mg  bid. 11/27/15 TSH 0.31, Hgb 12.4, Na 141, K 3.6, Bun 20, creat 0.64, TP 5.6, albumin 3.5     Diastolic CHF, acute on chronic (HCC) Clinically compensated.  08/21/15 CXR no acute cardiopulmonary  11/27/15 TSH 0.31, Hgb 12.4, Na 141, K 3.6, Bun 20, creat 0.64, TP 5.6, albumin 3.5     Stricture and stenosis of esophagus Stable, continue Famotidine 20mg  daily.   Constipation Stable, continue Senokot S II nightly, MiraLax  qod   Hypothyroidism TSH 0.31 11/27/15, decreased Levothyroxine to 2mcg qd, scheduled TSH   Dementia of the Alzheimer's type 05/19/14 MMSE 18/30, continue Namenda, SNF for care needs    Anemia 11/27/15 Hgb 0000000  Diastolic dysfunction Clinically compensated.  08/21/15 CXR no acute cardiopulmonary  11/27/15 TSH 0.31, Hgb 12.4, Na 141, K 3.6, Bun 20, creat 0.64, TP 5.6, albumin 3.5    Hypokalemia 11/27/15 TSH 0.31, Hgb 12.4, Na 141, K 3.6, Bun 20, creat 0.64, TP 5.6, albumin 3.5      Family/ staff Communication: continue SNF for care assistance.  Labs/tests ordered:  none

## 2016-01-04 NOTE — Assessment & Plan Note (Signed)
11/27/15 TSH 0.31, Hgb 12.4, Na 141, K 3.6, Bun 20, creat 0.64, TP 5.6, albumin 3.5

## 2016-01-04 NOTE — Assessment & Plan Note (Signed)
Stable, continue Famotidine 20mg daily.   

## 2016-01-21 ENCOUNTER — Encounter: Payer: Self-pay | Admitting: Nurse Practitioner

## 2016-01-21 ENCOUNTER — Non-Acute Institutional Stay (SKILLED_NURSING_FACILITY): Payer: Medicare Other | Admitting: Nurse Practitioner

## 2016-01-21 DIAGNOSIS — G309 Alzheimer's disease, unspecified: Secondary | ICD-10-CM

## 2016-01-21 DIAGNOSIS — R5383 Other fatigue: Secondary | ICD-10-CM

## 2016-01-21 DIAGNOSIS — I5033 Acute on chronic diastolic (congestive) heart failure: Secondary | ICD-10-CM

## 2016-01-21 DIAGNOSIS — K5900 Constipation, unspecified: Secondary | ICD-10-CM | POA: Diagnosis not present

## 2016-01-21 DIAGNOSIS — E039 Hypothyroidism, unspecified: Secondary | ICD-10-CM

## 2016-01-21 DIAGNOSIS — N189 Chronic kidney disease, unspecified: Secondary | ICD-10-CM

## 2016-01-21 DIAGNOSIS — K622 Anal prolapse: Secondary | ICD-10-CM | POA: Diagnosis not present

## 2016-01-21 DIAGNOSIS — K219 Gastro-esophageal reflux disease without esophagitis: Secondary | ICD-10-CM

## 2016-01-21 DIAGNOSIS — I10 Essential (primary) hypertension: Secondary | ICD-10-CM

## 2016-01-21 DIAGNOSIS — R5381 Other malaise: Secondary | ICD-10-CM | POA: Insufficient documentation

## 2016-01-21 DIAGNOSIS — D631 Anemia in chronic kidney disease: Secondary | ICD-10-CM

## 2016-01-21 DIAGNOSIS — F0280 Dementia in other diseases classified elsewhere without behavioral disturbance: Secondary | ICD-10-CM | POA: Diagnosis not present

## 2016-01-21 NOTE — Assessment & Plan Note (Signed)
Controlled, continue Labetalol 100mg  bid. 11/27/15 TSH 0.31, Hgb 12.4, Na 141, K 3.6, Bun 20, creat 0.64, TP 5.6, albumin 3.5

## 2016-01-21 NOTE — Assessment & Plan Note (Signed)
Stable, continue Famotidine 20mg daily.   

## 2016-01-21 NOTE — Assessment & Plan Note (Signed)
05/19/14 MMSE 18/30, continue Namenda, SNF for care needs 

## 2016-01-21 NOTE — Assessment & Plan Note (Addendum)
Chronic, avoid constipation, assist the patient with a good personal hygiene

## 2016-01-21 NOTE — Assessment & Plan Note (Signed)
TSH 0.31 11/27/15, decreased Levothyroxine to 18mcg qd, scheduled TSH

## 2016-01-21 NOTE — Assessment & Plan Note (Signed)
Stable, continue Senokot S II nightly, MiraLax qod

## 2016-01-21 NOTE — Progress Notes (Signed)
Location:  [facility] Room Number: 50 Place of Service:  SNF (31) Provider: Jaziah Goeller  NP  Jeanmarie Hubert, MD  Patient Care Team: Estill Dooms, MD as PCP - General (Internal Medicine) Hurman Horn, MD as Consulting Physician (Ophthalmology) Burnell Blanks, MD as Consulting Physician (Cardiology) Inda Castle, MD as Consulting Physician (Gastroenterology) Jari Pigg, MD as Consulting Physician (Dermatology) Dorna Leitz, MD as Consulting Physician (Orthopedic Surgery) Abrina Petz Otho Darner, NP as Nurse Practitioner (Internal Medicine)  Extended Emergency Contact Information Primary Emergency Contact: [facility] Address: [address]          [place], [place] [address] [place] [address] Phone: [phone] Relation: Daughter Secondary Emergency Contact: [place], [place], [place] [address] [place] Phone: [phone] Relation: Son  Code Status:  DNR  Goals of care: Advanced Directive information Advanced Directives 01/21/2016  Does patient have an advance directive? Yes  Type of Advance Directive Out of facility DNR (pink MOST or yellow form)  Does patient want to make changes to advanced directive? No - Patient declined  Copy of advanced directive(s) in chart? Yes  Pre-existing out of facility DNR order (yellow form or pink MOST form) -     Chief Complaint  Patient presents with  . Acute Visit    decreased appetite, elevated temp on 10/8,? rectal prolapse    HPI:  Pt is a 82 y.o. female seen today for an acute visit for generalized weakness, decreased appetite, resolved low grade T today, denied chest pain, no O2 desaturation, denied dysuria, nausea, vomiting, or diarrhea. hx of rectal prolapse but no present upon my examination except excoriated area around anus.    Hx of GERD asymptomatic, takes Famotidine 20mg  daily, last TSH 0.31 8/151/7, adjusted Levothyroxine to 62mcg, controlled blood pressure while  taking Labetalol 100mg  bid, taking Namenda for memory loss, she is functioning adequately in SNF. Denied pain, constipation, or trouble sleeping at night. 04/07/15-04/09/15 hospitalized, A Chest X-ray revealed a Bilateral Pleuarl Effusions, with the left greater than right. s/p thorocentesis.    Past Medical History:  Diagnosis Date  . Acute blood loss anemia 03/29/2014   03/30/14 Hgb 7.3 04/04/14 Hgb 8.5 continue Fe bid.  05/04/14 Hgb 10.8   . Benign neoplasm of colon   . Cancer ([place])    pre-melanoma  . Cystocele   . Dementia of the Alzheimer's type 05/22/2014   05/19/14 MMSE 18/30 Namenda.    . Diastolic dysfunction [id number]  . Diverticulosis of colon (without mention of hemorrhage)   . Diverticulosis of large intestine 06/28/2004  . Esophageal reflux   . Essential hypertension 07/05/2013  . Gastroparesis   . Hemorrhoids   . HTN (hypertension)   . Hyperlipidemia 07/05/2013  . Hypertension   . Hypothyroidism   . Internal hemorrhoids without mention of complication [id number]  . Macular degeneration   . Nonspecific abnormal electrocardiogram (ECG) (EKG)   . Osteoarthritis    right hip  . Other and unspecified hyperlipidemia   . Prediabetes 11/17/2013  . Primary osteoarthritis of right hip 03/27/2014  . Shingles   . Skin cancer (melanoma) ([place])   . Stricture and stenosis of esophagus   . Stricture and stenosis of esophagus 10/27/2007  . Vitamin D deficiency 11/17/2013   Past Surgical History:  Procedure Laterality Date  . APPENDECTOMY    . BCC resection     legs/face  . CATARACT EXTRACTION, BILATERAL    .  CHOLECYSTECTOMY    . FLEXIBLE SIGMOIDOSCOPY  05/12/2011   Procedure: FLEXIBLE SIGMOIDOSCOPY;  Surgeon: Inda Castle, MD;  Location: [facility];  Service: Endoscopy;  Laterality: N/A;  . FLEXIBLE SIGMOIDOSCOPY  09/10/2011   Procedure: FLEXIBLE SIGMOIDOSCOPY;  Surgeon: Inda Castle, MD;  Location: [facility];  Service: Endoscopy;  Laterality: N/A;  . FLEXIBLE SIGMOIDOSCOPY  N/A 08/16/2012   Procedure: FLEXIBLE SIGMOIDOSCOPY;  Surgeon: Inda Castle, MD;  Location: [facility];  Service: Endoscopy;  Laterality: N/A;  . FRACTURE SURGERY     left hip pin  . GALLBLADDER SURGERY  2002  . HEMORRHOID BANDING N/A 08/16/2012   Procedure: HEMORRHOID BANDING;  Surgeon: Inda Castle, MD;  Location: [facility];  Service: Endoscopy;  Laterality: N/A;  . HEMORRHOID SURGERY    . left eye surgery    . melenoma resection     right arm  . THYROIDECTOMY    . TONSILLECTOMY    . TOTAL HIP ARTHROPLASTY Right 03/27/2014   Procedure: TOTAL HIP ARTHROPLASTY ANTERIOR APPROACH;  Surgeon: Alta Corning, MD;  Location: [facility];  Service: Orthopedics;  Laterality: Right;    Allergies  Allergen Reactions  . Ace Inhibitors Other (See Comments)    Elevated potassium  . Statins Other (See Comments)    Myalgias   . Vicodin [Hydrocodone-Acetaminophen] Other (See Comments)    "felt spaced out"  . Fosamax [Alendronate Sodium] Other (See Comments)  . Metoclopramide Hcl Other (See Comments)  . Sulfa Antibiotics Rash  . Sulfonamide Derivatives Rash  . Vesicare [Solifenacin] Other (See Comments)      Medication List       Accurate as of 01/21/16  2:17 PM. Always use your most recent med list.          acetaminophen 500 MG tablet Commonly known as:  TYLENOL Take 1,000 mg by mouth 3 (three) times daily as needed for mild pain or moderate pain.   aspirin EC 81 MG tablet Take 81 mg by mouth daily.   CAL-GEST ANTACID 500 MG chewable tablet Generic drug:  calcium carbonate Chew 1 tablet by mouth daily as needed for indigestion or heartburn.   docusate sodium 100 MG capsule Commonly known as:  COLACE Take 100 mg by mouth 2 (two) times daily as needed for mild constipation.   famotidine 20 MG tablet Commonly known as:  PEPCID Take 20 mg by mouth at bedtime.   guaiFENesin 100 MG/5ML Soln Commonly known as:  ROBITUSSIN Take 10 mLs by mouth every 6 (six) hours as needed for  cough or to loosen phlegm.   labetalol 100 MG tablet Commonly known as:  NORMODYNE Take 100 mg by mouth 2 (two) times daily.   levothyroxine 75 MCG tablet Commonly known as:  SYNTHROID, LEVOTHROID Take 75 mcg by mouth daily before breakfast.   memantine 28 MG Cp24 24 hr capsule Commonly known as:  NAMENDA XR Take 28 mg by mouth daily.   polyethylene glycol packet Commonly known as:  MIRALAX / GLYCOLAX Take 17 g by mouth every other day.   PRESERVISION AREDS 2 Caps Take 1 capsule by mouth 2 (two) times daily.   protein supplement Powd Take 1 scoop by mouth. 1 scoop twice a day due to low albumin   sennosides-docusate sodium 8.6-50 MG tablet Commonly known as:  SENOKOT-S Take 2 tablets by mouth at bedtime.   Vitamin D 2000 units tablet Take 4,000 Units by mouth daily.       Review of Systems  Constitutional: Positive  for activity change, appetite change, fatigue and fever. Negative for chills.  HENT: Positive for hearing loss. Negative for congestion, ear discharge and ear pain.   Eyes: Negative for pain, discharge and redness.       Lower eyelids redness, a sty seen right lower eyelids, mild ectropion noted, healed.  Respiratory: Positive for cough. Negative for shortness of breath and wheezing.   Cardiovascular: Negative for chest pain and leg swelling.  Gastrointestinal: Negative for abdominal pain, constipation, nausea and vomiting.  Genitourinary: Positive for frequency. Negative for dysuria and urgency.  Musculoskeletal: Positive for back pain and gait problem. Negative for myalgias and neck pain.  Skin: Negative for rash.  Neurological: Negative for dizziness, tremors, seizures, weakness and headaches.       Alzheimer's  Hematological:       Hx anemia  Psychiatric/Behavioral: Positive for confusion and decreased concentration. Negative for hallucinations and suicidal ideas. The patient is nervous/anxious.     Immunization History  Administered Date(s)  Administered  . Influenza, High Dose Seasonal PF 12/27/2013  . Influenza-Unspecified 01/12/2013, 01/02/2015  . Pneumococcal-Unspecified 07/01/2012  . Td 05/27/2004  . Tdap 02/21/2012  . Zoster 04/15/2007   Pertinent  Health Maintenance Due  Topic Date Due  . PNA vac Low Risk Adult (2 of 2 - PCV13) 07/01/2013  . INFLUENZA VACCINE  11/13/2015  . DEXA SCAN  Completed   Fall Risk  05/22/2014 10/06/2013 06/02/2013  Falls in the past year? No No No  Risk for fall due to : - History of fall(s);Impaired balance/gait;Impaired mobility Impaired mobility   Functional Status Survey:    Vitals:   01/21/16 1029  BP: 132/68  Pulse: 91  Resp: 20  Temp: 99 F (37.2 C)  Weight: 138 lb 9.6 oz (62.9 kg)  Height: 5\' 1"  (1.549 m)   Body mass index is 26.19 kg/m. Physical Exam  Constitutional: She appears well-developed and well-nourished. No distress.  HENT:  Head: Normocephalic and atraumatic.  Right Ear: External ear normal.  Mouth/Throat: No oropharyngeal exudate.  Eyes: Conjunctivae and EOM are normal. Pupils are equal, round, and reactive to light. Right eye exhibits no discharge. Left eye exhibits no discharge. No scleral icterus.  lower eyelids redness, mild ectropion noted   Neck: Normal range of motion. Neck supple. No JVD present. No tracheal deviation present. No thyromegaly present.  Cardiovascular: Normal rate, regular rhythm, normal heart sounds and intact distal pulses.   No murmur heard. Pulmonary/Chest: Effort normal. No respiratory distress. She has no wheezes. She has rales. She exhibits no tenderness.  Abdominal: Soft. Bowel sounds are normal. She exhibits no distension and no mass. There is no tenderness.  Musculoskeletal: Normal range of motion. She exhibits tenderness. She exhibits no edema.  Right hip pain with ROM.  Neurological: She is alert. She has normal reflexes. No cranial nerve deficit. She exhibits normal muscle tone. Coordination normal.  dementia  Skin:  Skin is dry. No rash noted. She is not diaphoretic. No pallor.  Right hip surgical incision healed.   Psychiatric: She has a normal mood and affect. Her behavior is normal. Thought content normal. Her speech is not rapid and/or pressured, not delayed, not tangential and not slurred. Cognition and memory are impaired. She is communicative. She exhibits abnormal recent memory. She exhibits normal remote memory.    Labs reviewed:  Recent Labs  04/07/15 1635 04/08/15 0733 04/09/15 0500 04/17/15 07/10/15 11/27/15  NA 139 138 138 138 141 141  K 4.2 3.8 3.2* 3.7 3.9 3.6  CL 104 98* 102  --   --   --   CO2 25 27 25   --   --   --   GLUCOSE 102* 146* 91  --   --   --   BUN 17 12 19 16 19 20   CREATININE 0.75 0.76 0.74 0.8 0.8 0.6  CALCIUM 8.9 8.9 8.5*  --   --   --     Recent Labs  04/07/15 1635 04/09/15 0500 04/17/15 07/10/15 11/27/15  AST 16 13* 15 12* 12*  ALT 9* 8* 9 7 7   ALKPHOS 81 69 63 55 57  BILITOT 0.7 0.5  --   --   --   PROT 6.2* 5.3*  --   --   --   ALBUMIN 3.0* 2.8*  --   --   --     Recent Labs  04/07/15 1635 04/08/15 0733 04/09/15 0500 04/17/15 07/10/15 11/27/15  WBC 8.5 13.7* 9.7 7.7 6.2 7.5  NEUTROABS 5.7  --   --   --   --   --   HGB 12.0 13.1 11.1* 11.2* 11.9* 12.4  HCT 38.1 41.1 35.2* 34* 36 37  MCV 91.6 90.9 90.3  --   --   --   PLT 378 436* 371 338 231 255   Lab Results  Component Value Date   TSH 0.31 (A) 11/27/2015   Lab Results  Component Value Date   HGBA1C 5.8 (H) 07/06/2014   Lab Results  Component Value Date   CHOL 209 (H) 07/06/2014   HDL 66 07/06/2014   LDLCALC 111 (H) 07/06/2014   TRIG 160 (H) 07/06/2014   CHOLHDL 3.2 07/06/2014    Significant Diagnostic Results in last 30 days:  No results found.  Assessment/Plan Malaise and fatigue Will obtain CBC, CMP, UA C/S, CXR, observe the patient.   Essential hypertension Controlled, continue Labetalol 100mg  bid. 11/27/15 TSH 0.31, Hgb 12.4, Na 141, K 3.6, Bun 20, creat 0.64, TP 5.6,  albumin 3.5    Diastolic CHF, acute on chronic (HCC) Clinically compensated.  08/21/15 CXR no acute cardiopulmonary  11/27/15 TSH 0.31, Hgb 12.4, Na 141, K 3.6, Bun 20, creat 0.64, TP 5.6, albumin 3.5    GERD Stable, continue Famotidine 20mg  daily.    Constipation Stable, continue Senokot S II nightly, MiraLax qod    Anal prolapse Chronic, avoid constipation, assist the patient with a good personal hygiene  Hypothyroidism TSH 0.31 11/27/15, decreased Levothyroxine to 29mcg qd, scheduled TSH    Dementia of the Alzheimer's type 05/19/14 MMSE 18/30, continue Namenda, SNF for care needs   Anemia 11/27/15 Hgb 12.4      Family/ staff Communication: continue SNF for care assistance  Labs/tests ordered:  CBC, CMP, UA C/S CXR

## 2016-01-21 NOTE — Assessment & Plan Note (Signed)
Will obtain CBC, CMP, UA C/S, CXR, observe the patient.

## 2016-01-21 NOTE — Assessment & Plan Note (Signed)
Clinically compensated.  08/21/15 CXR no acute cardiopulmonary  11/27/15 TSH 0.31, Hgb 12.4, Na 141, K 3.6, Bun 20, creat 0.64, TP 5.6, albumin 3.5

## 2016-01-21 NOTE — Assessment & Plan Note (Signed)
11/27/15 Hgb 12.4

## 2016-01-22 DIAGNOSIS — J811 Chronic pulmonary edema: Secondary | ICD-10-CM | POA: Diagnosis not present

## 2016-01-22 DIAGNOSIS — N390 Urinary tract infection, site not specified: Secondary | ICD-10-CM | POA: Diagnosis not present

## 2016-01-22 DIAGNOSIS — I5033 Acute on chronic diastolic (congestive) heart failure: Secondary | ICD-10-CM | POA: Diagnosis not present

## 2016-01-22 LAB — BASIC METABOLIC PANEL
BUN: 24 mg/dL — AB (ref 4–21)
CREATININE: 1 mg/dL (ref <=1.1)
GLUCOSE: 87 mg/dL
Potassium: 3 mmol/L — AB (ref 3.4–5.3)
Sodium: 140 mmol/L (ref 137–147)

## 2016-01-22 LAB — CBC AND DIFFERENTIAL
HCT: 35 % — AB (ref 36–46)
Hemoglobin: 11.8 g/dL — AB (ref 12.0–16.0)
PLATELETS: 185 10*3/uL (ref 150–399)
WBC: 13.8 10^3/mL

## 2016-01-22 LAB — HEPATIC FUNCTION PANEL
ALK PHOS: 121 U/L (ref 25–125)
ALT: 15 U/L (ref 7–35)
AST: 18 U/L (ref 13–35)
BILIRUBIN, TOTAL: 0.6 mg/dL

## 2016-01-24 ENCOUNTER — Encounter: Payer: Self-pay | Admitting: Nurse Practitioner

## 2016-01-24 ENCOUNTER — Other Ambulatory Visit: Payer: Self-pay | Admitting: *Deleted

## 2016-01-24 DIAGNOSIS — J189 Pneumonia, unspecified organism: Secondary | ICD-10-CM | POA: Insufficient documentation

## 2016-01-24 DIAGNOSIS — I5033 Acute on chronic diastolic (congestive) heart failure: Secondary | ICD-10-CM | POA: Diagnosis not present

## 2016-01-24 DIAGNOSIS — E039 Hypothyroidism, unspecified: Secondary | ICD-10-CM | POA: Diagnosis not present

## 2016-01-24 LAB — BASIC METABOLIC PANEL
BUN: 17 mg/dL (ref 4–21)
Creatinine: 0.9 mg/dL (ref <=1.1)
GLUCOSE: 95 mg/dL
Potassium: 3.1 mmol/L — AB (ref 3.4–5.3)
SODIUM: 140 mmol/L (ref 137–147)

## 2016-01-24 LAB — TSH: TSH: 4.53 u[IU]/mL (ref <=5.90)

## 2016-01-25 ENCOUNTER — Other Ambulatory Visit: Payer: Self-pay | Admitting: *Deleted

## 2016-01-30 DIAGNOSIS — E039 Hypothyroidism, unspecified: Secondary | ICD-10-CM | POA: Diagnosis not present

## 2016-01-30 LAB — BASIC METABOLIC PANEL
BUN: 20 mg/dL (ref 4–21)
CREATININE: 0.8 mg/dL (ref <=1.1)
Glucose: 105 mg/dL
Potassium: 4.3 mmol/L (ref 3.4–5.3)
SODIUM: 143 mmol/L (ref 137–147)

## 2016-01-30 LAB — TSH: TSH: 6.61 u[IU]/mL — AB (ref <=5.90)

## 2016-01-31 ENCOUNTER — Other Ambulatory Visit: Payer: Self-pay | Admitting: *Deleted

## 2016-02-14 DIAGNOSIS — H353221 Exudative age-related macular degeneration, left eye, with active choroidal neovascularization: Secondary | ICD-10-CM | POA: Diagnosis not present

## 2016-02-14 DIAGNOSIS — E039 Hypothyroidism, unspecified: Secondary | ICD-10-CM | POA: Diagnosis not present

## 2016-02-14 LAB — TSH: TSH: 2.18 u[IU]/mL (ref <=5.90)

## 2016-02-18 ENCOUNTER — Other Ambulatory Visit: Payer: Self-pay | Admitting: *Deleted

## 2016-02-21 ENCOUNTER — Non-Acute Institutional Stay (SKILLED_NURSING_FACILITY): Payer: Medicare Other | Admitting: Nurse Practitioner

## 2016-02-21 ENCOUNTER — Encounter: Payer: Self-pay | Admitting: Nurse Practitioner

## 2016-02-21 DIAGNOSIS — K5900 Constipation, unspecified: Secondary | ICD-10-CM

## 2016-02-21 DIAGNOSIS — E039 Hypothyroidism, unspecified: Secondary | ICD-10-CM | POA: Diagnosis not present

## 2016-02-21 DIAGNOSIS — D649 Anemia, unspecified: Secondary | ICD-10-CM

## 2016-02-21 DIAGNOSIS — G309 Alzheimer's disease, unspecified: Secondary | ICD-10-CM

## 2016-02-21 DIAGNOSIS — K219 Gastro-esophageal reflux disease without esophagitis: Secondary | ICD-10-CM | POA: Diagnosis not present

## 2016-02-21 DIAGNOSIS — J189 Pneumonia, unspecified organism: Secondary | ICD-10-CM

## 2016-02-21 DIAGNOSIS — I10 Essential (primary) hypertension: Secondary | ICD-10-CM

## 2016-02-21 DIAGNOSIS — F0280 Dementia in other diseases classified elsewhere without behavioral disturbance: Secondary | ICD-10-CM

## 2016-02-21 DIAGNOSIS — I5033 Acute on chronic diastolic (congestive) heart failure: Secondary | ICD-10-CM | POA: Diagnosis not present

## 2016-02-21 NOTE — Assessment & Plan Note (Addendum)
Controlled, continue Labetalol 100mg  bid. 01/31/16 Na 143, K 4.3, Bun 20, creat 0.78,

## 2016-02-21 NOTE — Assessment & Plan Note (Signed)
01/22/16 CXR mild pathcy bibasilar densities, L>R, wbc 13.8, Hgb 11.8, Na 140, K 3.0, Bun 24, creat 0.95, TP 5.0, albumin 2.8, Levaquin 750mg  daily x 5,

## 2016-02-21 NOTE — Progress Notes (Signed)
Location:  [facility] Room Number: 50 Place of Service:  SNF (31) Provider:  Mikias Lanz, Manxie  NP  Jeanmarie Hubert, MD  Patient Care Team: Estill Dooms, MD as PCP - General (Internal Medicine) Hurman Horn, MD as Consulting Physician (Ophthalmology) Burnell Blanks, MD as Consulting Physician (Cardiology) Inda Castle, MD as Consulting Physician (Gastroenterology) Jari Pigg, MD as Consulting Physician (Dermatology) Dorna Leitz, MD as Consulting Physician (Orthopedic Surgery) Londyn Wotton Otho Darner, NP as Nurse Practitioner (Internal Medicine)  Extended Emergency Contact Information Primary Emergency Contact: [facility] Address: [address]          [place], [place] [address] Johnnette Litter of [place] Phone: [phone] Relation: Daughter Secondary Emergency Contact: Tuyet, [place]de Lexa, [place] [address] Johnnette Litter of [place] Phone: [phone] Relation: Son  Code Status:  DNR Goals of care: Advanced Directive information Advanced Directives 01/21/2016  Does patient have an advance directive? Yes  Type of Advance Directive Out of facility DNR (pink MOST or yellow form)  Does patient want to make changes to advanced directive? No - Patient declined  Copy of advanced directive(s) in chart? Yes  Pre-existing out of facility DNR order (yellow form or pink MOST form) -     Chief Complaint  Patient presents with  . Medical Management of Chronic Issues    HPI:  Pt is a 82 y.o. female seen today for medical management of chronic diseases.     01/22/16 CXR mild pathcy bibasilar densities, L>R, wbc 13.8, Hgb 11.8, Na 140, K 3.0, Bun 24, creat 0.95, TP 5.0, albumin 2.8, Levaquin 750mg  daily x 5  Hx of GERD asymptomatic, takes Famotidine 20mg  daily, last TSH 2.18 02/13/17, on Levothyroxine 28mcg, controlled blood pressure while taking Labetalol 100mg  bid, taking Namenda for memory loss, she is functioning adequately in SNF. Denied pain,  constipation, or trouble sleeping at night. 04/07/15-04/09/15 hospitalized, A Chest X-ray revealed a Bilateral Pleuarl Effusions, with the left greater than right. s/p thorocentesis.   Past Medical History:  Diagnosis Date  . Acute blood loss anemia 03/29/2014   03/30/14 Hgb 7.3 04/04/14 Hgb 8.5 continue Fe bid.  05/04/14 Hgb 10.8   . Benign neoplasm of colon   . Cancer ([place])    pre-melanoma  . Cystocele   . Dementia of the Alzheimer's type 05/22/2014   05/19/14 MMSE 18/30 Namenda.    . Diastolic dysfunction [id number]  . Diverticulosis of colon (without mention of hemorrhage)   . Diverticulosis of large intestine 06/28/2004  . Esophageal reflux   . Essential hypertension 07/05/2013  . Gastroparesis   . Hemorrhoids   . HTN (hypertension)   . Hyperlipidemia 07/05/2013  . Hypertension   . Hypothyroidism   . Internal hemorrhoids without mention of complication [id number]  . Macular degeneration   . Nonspecific abnormal electrocardiogram (ECG) (EKG)   . Osteoarthritis    right hip  . Other and unspecified hyperlipidemia   . Prediabetes 11/17/2013  . Primary osteoarthritis of right hip 03/27/2014  . Shingles   . Skin cancer (melanoma) ([facility])   . Stricture and stenosis of esophagus   . Stricture and stenosis of esophagus 10/27/2007  . Vitamin D deficiency 11/17/2013   Past Surgical History:  Procedure Laterality Date  . APPENDECTOMY    . BCC resection     legs/face  . CATARACT EXTRACTION, BILATERAL    . CHOLECYSTECTOMY    . FLEXIBLE SIGMOIDOSCOPY  05/12/2011   Procedure: FLEXIBLE  SIGMOIDOSCOPY;  Surgeon: Inda Castle, MD;  Location: [facility];  Service: Endoscopy;  Laterality: N/A;  . FLEXIBLE SIGMOIDOSCOPY  09/10/2011   Procedure: FLEXIBLE SIGMOIDOSCOPY;  Surgeon: Inda Castle, MD;  Location: [facility];  Service: Endoscopy;  Laterality: N/A;  . FLEXIBLE SIGMOIDOSCOPY N/A 08/16/2012   Procedure: FLEXIBLE SIGMOIDOSCOPY;  Surgeon: Inda Castle, MD;  Location: [facility];   Service: Endoscopy;  Laterality: N/A;  . FRACTURE SURGERY     left hip pin  . GALLBLADDER SURGERY  2002  . HEMORRHOID BANDING N/A 08/16/2012   Procedure: HEMORRHOID BANDING;  Surgeon: Inda Castle, MD;  Location: [facility];  Service: Endoscopy;  Laterality: N/A;  . HEMORRHOID SURGERY    . left eye surgery    . melenoma resection     right arm  . THYROIDECTOMY    . TONSILLECTOMY    . TOTAL HIP ARTHROPLASTY Right 03/27/2014   Procedure: TOTAL HIP ARTHROPLASTY ANTERIOR APPROACH;  Surgeon: Alta Corning, MD;  Location: [facility];  Service: Orthopedics;  Laterality: Right;    Allergies  Allergen Reactions  . Ace Inhibitors Other (See Comments)    Elevated potassium  . Statins Other (See Comments)    Myalgias   . Vicodin [Hydrocodone-Acetaminophen] Other (See Comments)    "felt spaced out"  . Fosamax [Alendronate Sodium] Other (See Comments)  . Metoclopramide Hcl Other (See Comments)  . Sulfa Antibiotics Rash  . Sulfonamide Derivatives Rash  . Vesicare [Solifenacin] Other (See Comments)      Medication List       Accurate as of 02/21/16  3:38 PM. Always use your most recent med list.          acetaminophen 500 MG tablet Commonly known as:  TYLENOL Take 1,000 mg by mouth 3 (three) times daily as needed for mild pain or moderate pain.   aspirin EC 81 MG tablet Take 81 mg by mouth daily.   CAL-GEST ANTACID 500 MG chewable tablet Generic drug:  calcium carbonate Chew 1 tablet by mouth daily as needed for indigestion or heartburn.   docusate sodium 100 MG capsule Commonly known as:  COLACE Take 100 mg by mouth 2 (two) times daily as needed for mild constipation.   famotidine 20 MG tablet Commonly known as:  PEPCID Take 20 mg by mouth at bedtime.   guaiFENesin 100 MG/5ML Soln Commonly known as:  ROBITUSSIN Take 10 mLs by mouth every 6 (six) hours as needed for cough or to loosen phlegm.   labetalol 100 MG tablet Commonly known as:  NORMODYNE Take 100 mg by mouth 2  (two) times daily.   levothyroxine 75 MCG tablet Commonly known as:  SYNTHROID, LEVOTHROID Take 75 mcg by mouth daily before breakfast.   memantine 28 MG Cp24 24 hr capsule Commonly known as:  NAMENDA XR Take 28 mg by mouth daily.   polyethylene glycol packet Commonly known as:  MIRALAX / GLYCOLAX Take 17 g by mouth every other day.   potassium chloride SA 20 MEQ tablet Commonly known as:  K-DUR,KLOR-CON Take 20 mEq by mouth 2 (two) times daily.   PRESERVISION AREDS 2 Caps Take 1 capsule by mouth 2 (two) times daily.   protein supplement Powd Take 1 scoop by mouth. 1 scoop twice a day due to low albumin   sennosides-docusate sodium 8.6-50 MG tablet Commonly known as:  SENOKOT-S Take 2 tablets by mouth at bedtime.   Vitamin D 2000 units tablet Take 4,000 Units by mouth daily.  Review of Systems  Constitutional: Positive for fatigue. Negative for activity change, appetite change, chills and fever.  HENT: Positive for hearing loss. Negative for congestion, ear discharge and ear pain.   Eyes: Negative for pain, discharge and redness.       Lower eyelids redness, a sty seen right lower eyelids, mild ectropion noted, healed.  Respiratory: Positive for cough. Negative for shortness of breath and wheezing.   Cardiovascular: Negative for chest pain and leg swelling.  Gastrointestinal: Negative for abdominal pain, constipation, nausea and vomiting.  Genitourinary: Positive for frequency. Negative for dysuria and urgency.  Musculoskeletal: Positive for back pain and gait problem. Negative for myalgias and neck pain.  Skin: Negative for rash.  Neurological: Negative for dizziness, tremors, seizures, weakness and headaches.       Alzheimer's  Hematological:       Hx anemia  Psychiatric/Behavioral: Positive for confusion and decreased concentration. Negative for hallucinations and suicidal ideas. The patient is nervous/anxious.     Immunization History  Administered  Date(s) Administered  . Influenza, High Dose Seasonal PF 12/27/2013  . Influenza-Unspecified 01/12/2013, 01/02/2015, 01/29/2016  . Pneumococcal-Unspecified 07/01/2012  . Td 05/27/2004  . Tdap 02/21/2012  . Zoster 04/15/2007   Pertinent  Health Maintenance Due  Topic Date Due  . PNA vac Low Risk Adult (2 of 2 - PCV13) 07/01/2013  . INFLUENZA VACCINE  Completed  . DEXA SCAN  Completed   Fall Risk  05/22/2014 10/06/2013 06/02/2013  Falls in the past year? No No No  Risk for fall due to : - History of fall(s);Impaired balance/gait;Impaired mobility Impaired mobility   Functional Status Survey:    Vitals:   02/21/16 1431  BP: 124/63  Pulse: 70  Resp: 20  Weight: 135 lb 12.8 oz (61.6 kg)  Height: 5\' 1"  (1.549 m)   Body mass index is 25.66 kg/m. Physical Exam  Constitutional: She appears well-developed and well-nourished. No distress.  HENT:  Head: Normocephalic and atraumatic.  Right Ear: External ear normal.  Mouth/Throat: No oropharyngeal exudate.  Eyes: Conjunctivae and EOM are normal. Pupils are equal, round, and reactive to light. Right eye exhibits no discharge. Left eye exhibits no discharge. No scleral icterus.  lower eyelids redness, mild ectropion noted   Neck: Normal range of motion. Neck supple. No JVD present. No tracheal deviation present. No thyromegaly present.  Cardiovascular: Normal rate, regular rhythm, normal heart sounds and intact distal pulses.   No murmur heard. Pulmonary/Chest: Effort normal. No respiratory distress. She has no wheezes. She has rales. She exhibits no tenderness.  Abdominal: Soft. Bowel sounds are normal. She exhibits no distension and no mass. There is no tenderness.  Musculoskeletal: Normal range of motion. She exhibits tenderness. She exhibits no edema.  Right hip pain with ROM.  Neurological: She is alert. She has normal reflexes. No cranial nerve deficit. She exhibits normal muscle tone. Coordination normal.  dementia  Skin: Skin is  dry. No rash noted. She is not diaphoretic. No pallor.  Right hip surgical incision healed.   Psychiatric: She has a normal mood and affect. Her behavior is normal. Thought content normal. Her speech is not rapid and/or pressured, not delayed, not tangential and not slurred. Cognition and memory are impaired. She is communicative. She exhibits abnormal recent memory. She exhibits normal remote memory.    Labs reviewed:  Recent Labs  04/07/15 1635 04/08/15 0733 04/09/15 0500  01/22/16 01/24/16 01/30/16  NA 139 138 138  < > 140 140 143  K 4.2 3.8  3.2*  < > 3.0* 3.1* 4.3  CL 104 98* 102  --   --   --   --   CO2 25 27 25   --   --   --   --   GLUCOSE 102* 146* 91  --   --   --   --   BUN 17 12 19   < > 24* 17 20  CREATININE 0.75 0.76 0.74  < > 1.0 0.9 0.8  CALCIUM 8.9 8.9 8.5*  --   --   --   --   < > = values in this interval not displayed.  Recent Labs  04/07/15 1635 04/09/15 0500  07/10/15 11/27/15 01/22/16  AST 16 13*  < > 12* 12* 18  ALT 9* 8*  < > 7 7 15   ALKPHOS 81 69  < > 55 57 121  BILITOT 0.7 0.5  --   --   --   --   PROT 6.2* 5.3*  --   --   --   --   ALBUMIN 3.0* 2.8*  --   --   --   --   < > = values in this interval not displayed.  Recent Labs  04/07/15 1635 04/08/15 0733 04/09/15 0500  07/10/15 11/27/15 01/22/16  WBC 8.5 13.7* 9.7  < > 6.2 7.5 13.8  NEUTROABS 5.7  --   --   --   --   --   --   HGB 12.0 13.1 11.1*  < > 11.9* 12.4 11.8*  HCT 38.1 41.1 35.2*  < > 36 37 35*  MCV 91.6 90.9 90.3  --   --   --   --   PLT 378 436* 371  < > 231 255 185  < > = values in this interval not displayed. Lab Results  Component Value Date   TSH 2.18 02/14/2016   Lab Results  Component Value Date   HGBA1C 5.8 (H) 07/06/2014   Lab Results  Component Value Date   CHOL 209 (H) 07/06/2014   HDL 66 07/06/2014   LDLCALC 111 (H) 07/06/2014   TRIG 160 (H) 07/06/2014   CHOLHDL 3.2 07/06/2014    Significant Diagnostic Results in last 30 days:  No results  found.  Assessment/Plan Hypothyroidism TSH 2.18 02/14/16, continue Levothyroxine 47mcg qd  Essential hypertension Controlled, continue Labetalol 100mg  bid. 01/31/16 Na 143, K 4.3, Bun 20, creat 123456,   Diastolic CHF, acute on chronic (HCC) Clinically compensated.    CAP (community acquired pneumonia) 01/22/16 CXR mild pathcy bibasilar densities, L>R, wbc 13.8, Hgb 11.8, Na 140, K 3.0, Bun 24, creat 0.95, TP 5.0, albumin 2.8, Levaquin 750mg  daily x 5,  GERD Stable, continue Famotidine 20mg  daily.     Constipation Stable, continue Senokot S II nightly, MiraLax qod     Dementia of the Alzheimer's type 05/19/14 MMSE 18/30, continue Namenda, SNF for care needs   Anemia 01/22/16 Hgb 11.8     Family/ staff Communication: continue SNF for care assistance.   Labs/tests ordered:  none

## 2016-02-21 NOTE — Assessment & Plan Note (Signed)
Stable, continue Senokot S II nightly, MiraLax qod

## 2016-02-21 NOTE — Assessment & Plan Note (Signed)
05/19/14 MMSE 18/30, continue Namenda, SNF for care needs 

## 2016-02-21 NOTE — Assessment & Plan Note (Signed)
TSH 2.18 02/14/16, continue Levothyroxine 67mcg qd

## 2016-02-21 NOTE — Assessment & Plan Note (Signed)
01/22/16 Hgb 11.8

## 2016-02-21 NOTE — Progress Notes (Deleted)
Location:  [facility] Room Number: 50 Place of Service:  SNF (31) Provider:  Mast, Manxie  NP  Jeanmarie Hubert, MD  Patient Care Team: Estill Dooms, MD as PCP - General (Internal Medicine) Hurman Horn, MD as Consulting Physician (Ophthalmology) Burnell Blanks, MD as Consulting Physician (Cardiology) Inda Castle, MD as Consulting Physician (Gastroenterology) Jari Pigg, MD as Consulting Physician (Dermatology) Dorna Leitz, MD as Consulting Physician (Orthopedic Surgery) Man Otho Darner, NP as Nurse Practitioner (Internal Medicine)  Extended Emergency Contact Information Primary Emergency Contact: [facility] Address: [address]          [place], [place] [address] Johnnette Litter of [place] Phone: [phone] Relation: Daughter Secondary Emergency Contact: Liz, Bahar [place], [place] [address] Johnnette Litter of [place] Phone: [phone] Relation: Son  Code Status:  DNR Goals of care: Advanced Directive information Advanced Directives 01/21/2016  Does patient have an advance directive? Yes  Type of Advance Directive Out of facility DNR (pink MOST or yellow form)  Does patient want to make changes to advanced directive? No - Patient declined  Copy of advanced directive(s) in chart? Yes  Pre-existing out of facility DNR order (yellow form or pink MOST form) -     Chief Complaint  Patient presents with  . Medical Management of Chronic Issues    HPI:  Pt is a 82 y.o. female seen today for medical management of chronic diseases.     Past Medical History:  Diagnosis Date  . Acute blood loss anemia 03/29/2014   03/30/14 Hgb 7.3 04/04/14 Hgb 8.5 continue Fe bid.  05/04/14 Hgb 10.8   . Benign neoplasm of colon   . Cancer ([place])    pre-melanoma  . Cystocele   . Dementia of the Alzheimer's type 05/22/2014   05/19/14 MMSE 18/30 Namenda.    . Diastolic dysfunction [id number]  . Diverticulosis of colon (without mention of hemorrhage)     . Diverticulosis of large intestine 06/28/2004  . Esophageal reflux   . Essential hypertension 07/05/2013  . Gastroparesis   . Hemorrhoids   . HTN (hypertension)   . Hyperlipidemia 07/05/2013  . Hypertension   . Hypothyroidism   . Internal hemorrhoids without mention of complication [id number]  . Macular degeneration   . Nonspecific abnormal electrocardiogram (ECG) (EKG)   . Osteoarthritis    right hip  . Other and unspecified hyperlipidemia   . Prediabetes 11/17/2013  . Primary osteoarthritis of right hip 03/27/2014  . Shingles   . Skin cancer (melanoma) ([place])   . Stricture and stenosis of esophagus   . Stricture and stenosis of esophagus 10/27/2007  . Vitamin D deficiency 11/17/2013   Past Surgical History:  Procedure Laterality Date  . APPENDECTOMY    . BCC resection     legs/face  . CATARACT EXTRACTION, BILATERAL    . CHOLECYSTECTOMY    . FLEXIBLE SIGMOIDOSCOPY  05/12/2011   Procedure: FLEXIBLE SIGMOIDOSCOPY;  Surgeon: Inda Castle, MD;  Location: [facility];  Service: Endoscopy;  Laterality: N/A;  . FLEXIBLE SIGMOIDOSCOPY  09/10/2011   Procedure: FLEXIBLE SIGMOIDOSCOPY;  Surgeon: Inda Castle, MD;  Location: [facility];  Service: Endoscopy;  Laterality: N/A;  . FLEXIBLE SIGMOIDOSCOPY N/A 08/16/2012   Procedure: FLEXIBLE SIGMOIDOSCOPY;  Surgeon: Inda Castle, MD;  Location: [facility];  Service: Endoscopy;  Laterality: N/A;  . FRACTURE SURGERY     left hip pin  . GALLBLADDER SURGERY  2002  .  HEMORRHOID BANDING N/A 08/16/2012   Procedure: HEMORRHOID BANDING;  Surgeon: Inda Castle, MD;  Location: [facility];  Service: Endoscopy;  Laterality: N/A;  . HEMORRHOID SURGERY    . left eye surgery    . melenoma resection     right arm  . THYROIDECTOMY    . TONSILLECTOMY    . TOTAL HIP ARTHROPLASTY Right 03/27/2014   Procedure: TOTAL HIP ARTHROPLASTY ANTERIOR APPROACH;  Surgeon: Alta Corning, MD;  Location: [facility];  Service: Orthopedics;  Laterality: Right;     Allergies  Allergen Reactions  . Ace Inhibitors Other (See Comments)    Elevated potassium  . Statins Other (See Comments)    Myalgias   . Vicodin [Hydrocodone-Acetaminophen] Other (See Comments)    "felt spaced out"  . Fosamax [Alendronate Sodium] Other (See Comments)  . Metoclopramide Hcl Other (See Comments)  . Sulfa Antibiotics Rash  . Sulfonamide Derivatives Rash  . Vesicare [Solifenacin] Other (See Comments)      Medication List       Accurate as of 02/21/16  2:29 PM. Always use your most recent med list.          acetaminophen 500 MG tablet Commonly known as:  TYLENOL Take 1,000 mg by mouth 3 (three) times daily as needed for mild pain or moderate pain.   aspirin EC 81 MG tablet Take 81 mg by mouth daily.   CAL-GEST ANTACID 500 MG chewable tablet Generic drug:  calcium carbonate Chew 1 tablet by mouth daily as needed for indigestion or heartburn.   docusate sodium 100 MG capsule Commonly known as:  COLACE Take 100 mg by mouth 2 (two) times daily as needed for mild constipation.   famotidine 20 MG tablet Commonly known as:  PEPCID Take 20 mg by mouth at bedtime.   guaiFENesin 100 MG/5ML Soln Commonly known as:  ROBITUSSIN Take 10 mLs by mouth every 6 (six) hours as needed for cough or to loosen phlegm.   labetalol 100 MG tablet Commonly known as:  NORMODYNE Take 100 mg by mouth 2 (two) times daily.   levothyroxine 75 MCG tablet Commonly known as:  SYNTHROID, LEVOTHROID Take 75 mcg by mouth daily before breakfast.   memantine 28 MG Cp24 24 hr capsule Commonly known as:  NAMENDA XR Take 28 mg by mouth daily.   polyethylene glycol packet Commonly known as:  MIRALAX / GLYCOLAX Take 17 g by mouth every other day.   potassium chloride SA 20 MEQ tablet Commonly known as:  K-DUR,KLOR-CON Take 20 mEq by mouth 2 (two) times daily.   PRESERVISION AREDS 2 Caps Take 1 capsule by mouth 2 (two) times daily.   protein supplement Powd Take 1 scoop by  mouth. 1 scoop twice a day due to low albumin   sennosides-docusate sodium 8.6-50 MG tablet Commonly known as:  SENOKOT-S Take 2 tablets by mouth at bedtime.   Vitamin D 2000 units tablet Take 4,000 Units by mouth daily.       Review of Systems  Immunization History  Administered Date(s) Administered  . Influenza, High Dose Seasonal PF 12/27/2013  . Influenza-Unspecified 01/12/2013, 01/02/2015, 01/29/2016  . Pneumococcal-Unspecified 07/01/2012  . Td 05/27/2004  . Tdap 02/21/2012  . Zoster 04/15/2007   Pertinent  Health Maintenance Due  Topic Date Due  . PNA vac Low Risk Adult (2 of 2 - PCV13) 07/01/2013  . INFLUENZA VACCINE  Completed  . DEXA SCAN  Completed   Fall Risk  05/22/2014 10/06/2013 06/02/2013  Falls in  the past year? No No No  Risk for fall due to : - History of fall(s);Impaired balance/gait;Impaired mobility Impaired mobility   Functional Status Survey:    There were no vitals filed for this visit. There is no height or weight on file to calculate BMI. Physical Exam  Labs reviewed:  Recent Labs  04/07/15 1635 04/08/15 0733 04/09/15 0500  01/22/16 01/24/16 01/30/16  NA 139 138 138  < > 140 140 143  K 4.2 3.8 3.2*  < > 3.0* 3.1* 4.3  CL 104 98* 102  --   --   --   --   CO2 25 27 25   --   --   --   --   GLUCOSE 102* 146* 91  --   --   --   --   BUN 17 12 19   < > 24* 17 20  CREATININE 0.75 0.76 0.74  < > 1.0 0.9 0.8  CALCIUM 8.9 8.9 8.5*  --   --   --   --   < > = values in this interval not displayed.  Recent Labs  04/07/15 1635 04/09/15 0500  07/10/15 11/27/15 01/22/16  AST 16 13*  < > 12* 12* 18  ALT 9* 8*  < > 7 7 15   ALKPHOS 81 69  < > 55 57 121  BILITOT 0.7 0.5  --   --   --   --   PROT 6.2* 5.3*  --   --   --   --   ALBUMIN 3.0* 2.8*  --   --   --   --   < > = values in this interval not displayed.  Recent Labs  04/07/15 1635 04/08/15 0733 04/09/15 0500  07/10/15 11/27/15 01/22/16  WBC 8.5 13.7* 9.7  < > 6.2 7.5 13.8  NEUTROABS 5.7   --   --   --   --   --   --   HGB 12.0 13.1 11.1*  < > 11.9* 12.4 11.8*  HCT 38.1 41.1 35.2*  < > 36 37 35*  MCV 91.6 90.9 90.3  --   --   --   --   PLT 378 436* 371  < > 231 255 185  < > = values in this interval not displayed. Lab Results  Component Value Date   TSH 2.18 02/14/2016   Lab Results  Component Value Date   HGBA1C 5.8 (H) 07/06/2014   Lab Results  Component Value Date   CHOL 209 (H) 07/06/2014   HDL 66 07/06/2014   LDLCALC 111 (H) 07/06/2014   TRIG 160 (H) 07/06/2014   CHOLHDL 3.2 07/06/2014    Significant Diagnostic Results in last 30 days:  No results found.  Assessment/Plan There are no diagnoses linked to this encounter.   Family/ staff Communication:   Labs/tests ordered:

## 2016-02-21 NOTE — Assessment & Plan Note (Signed)
Clinically compensated.

## 2016-02-21 NOTE — Assessment & Plan Note (Signed)
Stable, continue Famotidine 20mg daily.   

## 2016-03-09 IMAGING — DX DG CHEST 1V
1 series · 1 of 1 positions shown · non-contrast
Comparison: Radiographs and CT 04/07/2015.

CLINICAL DATA: Follow up left pleural effusion. Thoracentesis
today.

EXAM:
CHEST 1 VIEW

[chest ap]
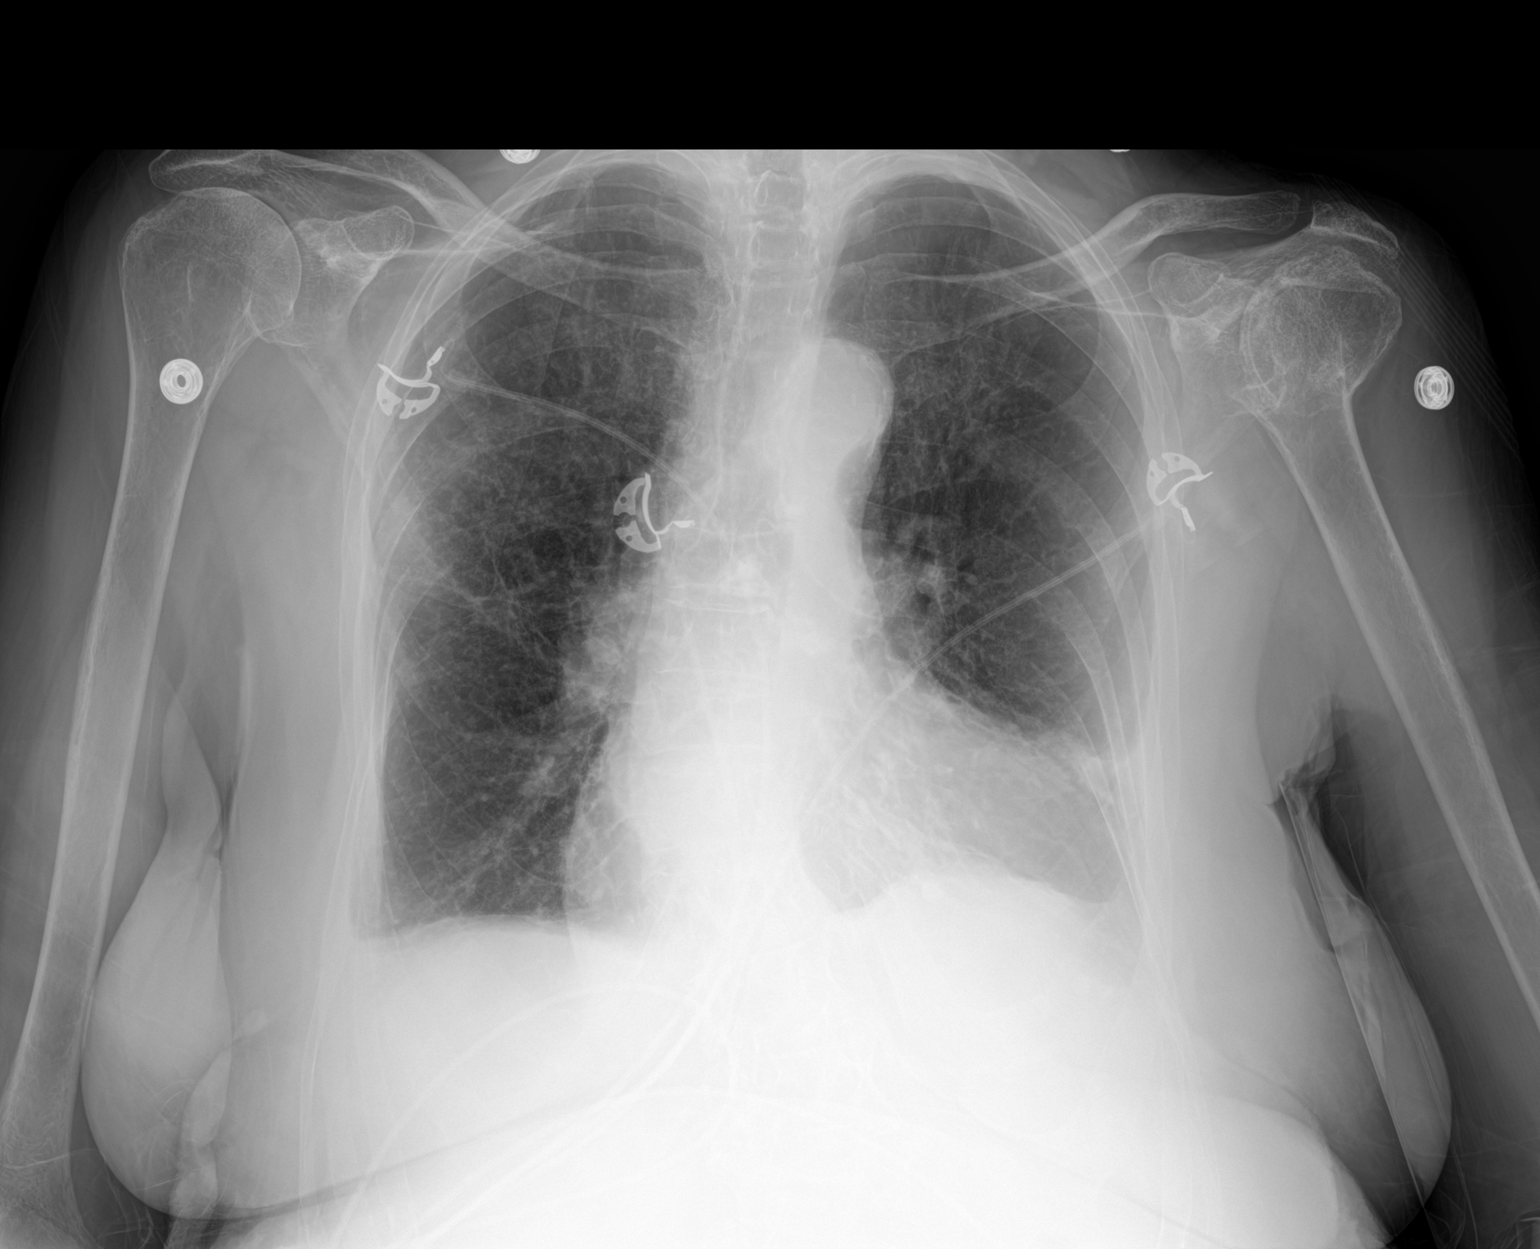

[1 of 1 positions shown; findings below may reference images not displayed]

FINDINGS: 5558 hours. Interval near-complete evacuation of previously
demonstrated large left pleural effusion. There has been
re-expansion of the left lung. Pulmonary edema has resolved. There
is no pneumothorax. The heart size and mediastinal contours are
stable. Posttraumatic deformity of the proximal left humerus again
noted.
IMPRESSION: Interval near-complete evacuation of left-sided pleural effusion. No
pneumothorax.

## 2016-03-17 ENCOUNTER — Non-Acute Institutional Stay (SKILLED_NURSING_FACILITY): Payer: Medicare Other | Admitting: Nurse Practitioner

## 2016-03-17 ENCOUNTER — Encounter: Payer: Self-pay | Admitting: Nurse Practitioner

## 2016-03-17 DIAGNOSIS — E039 Hypothyroidism, unspecified: Secondary | ICD-10-CM | POA: Diagnosis not present

## 2016-03-17 DIAGNOSIS — I5033 Acute on chronic diastolic (congestive) heart failure: Secondary | ICD-10-CM

## 2016-03-17 DIAGNOSIS — F0280 Dementia in other diseases classified elsewhere without behavioral disturbance: Secondary | ICD-10-CM

## 2016-03-17 DIAGNOSIS — E876 Hypokalemia: Secondary | ICD-10-CM

## 2016-03-17 DIAGNOSIS — G308 Other Alzheimer's disease: Secondary | ICD-10-CM | POA: Diagnosis not present

## 2016-03-17 DIAGNOSIS — R0789 Other chest pain: Secondary | ICD-10-CM

## 2016-03-17 DIAGNOSIS — I10 Essential (primary) hypertension: Secondary | ICD-10-CM

## 2016-03-17 DIAGNOSIS — K5900 Constipation, unspecified: Secondary | ICD-10-CM

## 2016-03-17 DIAGNOSIS — K219 Gastro-esophageal reflux disease without esophagitis: Secondary | ICD-10-CM | POA: Diagnosis not present

## 2016-03-17 NOTE — Assessment & Plan Note (Signed)
Stable, continue Senokot S II nightly, MiraLax qod

## 2016-03-17 NOTE — Assessment & Plan Note (Signed)
Controlled, continue Labetalol 100mg  bid. 01/31/16 Na 143, K 4.3, Bun 20, creat 0.78,

## 2016-03-17 NOTE — Progress Notes (Signed)
Location:  [facility] Room Number: 23 Place of Service:  SNF (31) Provider: Elisavet Buehrer, Manxie  NP  Jeanmarie Hubert, MD  Patient Care Team: Estill Dooms, MD as PCP - General (Internal Medicine) Hurman Horn, MD as Consulting Physician (Ophthalmology) Burnell Blanks, MD as Consulting Physician (Cardiology) Inda Castle, MD as Consulting Physician (Gastroenterology) Jari Pigg, MD as Consulting Physician (Dermatology) Dorna Leitz, MD as Consulting Physician (Orthopedic Surgery) Ayaat Jansma Otho Darner, NP as Nurse Practitioner (Internal Medicine)  Extended Emergency Contact Information Primary Emergency Contact: [facility] Address: [address]          [place], [place] [address] Johnnette Litter of [place] Phone: [phone] Relation: Daughter Secondary Emergency Contact: Jerrika, [place],  [address] Johnnette Litter of [place] Phone: [phone] Relation: Son  Code Status:  DNR Goals of care: Advanced Directive information Advanced Directives 03/17/2016  Does Patient Have a Medical Advance Directive? Yes  Type of Advance Directive Out of facility DNR (pink MOST or yellow form)  Does patient want to make changes to medical advance directive? No - Patient declined  Copy of [place] in Chart? Yes  Pre-existing out of facility DNR order (yellow form or pink MOST form) -     Chief Complaint  Patient presents with  . Acute Visit    chest pain, confusion    HPI:  Pt is a 82 y.o. female seen today for an acute visit for c/o chest pain 03/14/16, resolved today w/o intervention, persisted confusion, she is afebrile, no O2 desaturation.   01/22/16 CXR mild pathcy bibasilar densities, L>R, wbc 13.8, Hgb 11.8, Na 140, K 3.0, Bun 24, creat 0.95, TP 5.0, albumin 2.8, Levaquin 750mg  daily x 5             Hx of GERD asymptomatic, takes Famotidine 20mg  daily, last TSH 2.18 02/14/16, on Levothyroxine 23mcg, controlled blood pressure  while taking Labetalol 100mg  bid, taking Namenda for memory loss, she is functioning adequately in SNF. Denied pain, constipation, or trouble sleeping at night.  04/07/15-04/09/15 hospitalized, A Chest X-ray revealed a Bilateral Pleuarl Effusions, the left, s/p thorocentesis.    Past Medical History:  Diagnosis Date  . Acute blood loss anemia 03/29/2014   03/30/14 Hgb 7.3 04/04/14 Hgb 8.5 continue Fe bid.  05/04/14 Hgb 10.8   . Benign neoplasm of colon   . Cancer ([place])    pre-melanoma  . Cystocele   . Dementia of the Alzheimer's type 05/22/2014   05/19/14 MMSE 18/30 Namenda.    . Diastolic dysfunction [id number]  . Diverticulosis of colon (without mention of hemorrhage)   . Diverticulosis of large intestine 06/28/2004  . Esophageal reflux   . Essential hypertension 07/05/2013  . Gastroparesis   . Hemorrhoids   . HTN (hypertension)   . Hyperlipidemia 07/05/2013  . Hypertension   . Hypothyroidism   . Internal hemorrhoids without mention of complication [id number]  . Macular degeneration   . Nonspecific abnormal electrocardiogram (ECG) (EKG)   . Osteoarthritis    right hip  . Other and unspecified hyperlipidemia   . Prediabetes 11/17/2013  . Primary osteoarthritis of right hip 03/27/2014  . Shingles   . Skin cancer (melanoma) ([place])   . Stricture and stenosis of esophagus   . Stricture and stenosis of esophagus 10/27/2007  . Vitamin D deficiency 11/17/2013   Past Surgical History:  Procedure Laterality Date  . APPENDECTOMY    . [place]  resection     legs/face  . CATARACT EXTRACTION, BILATERAL    . CHOLECYSTECTOMY    . FLEXIBLE SIGMOIDOSCOPY  05/12/2011   Procedure: FLEXIBLE SIGMOIDOSCOPY;  Surgeon: Inda Castle, MD;  Location: [facility];  Service: Endoscopy;  Laterality: N/A;  . FLEXIBLE SIGMOIDOSCOPY  09/10/2011   Procedure: FLEXIBLE SIGMOIDOSCOPY;  Surgeon: Inda Castle, MD;  Location: [facility];  Service: Endoscopy;  Laterality: N/A;  . FLEXIBLE SIGMOIDOSCOPY N/A 08/16/2012    Procedure: FLEXIBLE SIGMOIDOSCOPY;  Surgeon: Inda Castle, MD;  Location: [facility];  Service: Endoscopy;  Laterality: N/A;  . FRACTURE SURGERY     left hip pin  . GALLBLADDER SURGERY  2002  . HEMORRHOID BANDING N/A 08/16/2012   Procedure: HEMORRHOID BANDING;  Surgeon: Inda Castle, MD;  Location: [facility];  Service: Endoscopy;  Laterality: N/A;  . HEMORRHOID SURGERY    . left eye surgery    . melenoma resection     right arm  . THYROIDECTOMY    . TONSILLECTOMY    . TOTAL HIP ARTHROPLASTY Right 03/27/2014   Procedure: TOTAL HIP ARTHROPLASTY ANTERIOR APPROACH;  Surgeon: Alta Corning, MD;  Location: [facility];  Service: Orthopedics;  Laterality: Right;    Allergies  Allergen Reactions  . Ace Inhibitors Other (See Comments)    Elevated potassium  . Statins Other (See Comments)    Myalgias   . Vicodin [Hydrocodone-Acetaminophen] Other (See Comments)    "felt spaced out"  . Fosamax [Alendronate Sodium] Other (See Comments)  . Metoclopramide Hcl Other (See Comments)  . Sulfa Antibiotics Rash  . Sulfonamide Derivatives Rash  . Vesicare [Solifenacin] Other (See Comments)      Medication List       Accurate as of 03/17/16  1:17 PM. Always use your most recent med list.          acetaminophen 500 MG tablet Commonly known as:  TYLENOL Take 1,000 mg by mouth 3 (three) times daily as needed for mild pain or moderate pain.   aspirin EC 81 MG tablet Take 81 mg by mouth daily.   CAL-GEST ANTACID 500 MG chewable tablet Generic drug:  calcium carbonate Chew 1 tablet by mouth daily as needed for indigestion or heartburn.   docusate sodium 100 MG capsule Commonly known as:  COLACE Take 100 mg by mouth 2 (two) times daily as needed for mild constipation.   famotidine 20 MG tablet Commonly known as:  PEPCID Take 20 mg by mouth at bedtime.   guaiFENesin 100 MG/5ML Soln Commonly known as:  ROBITUSSIN Take 10 mLs by mouth every 6 (six) hours as needed for cough or to  loosen phlegm.   labetalol 100 MG tablet Commonly known as:  NORMODYNE Take 100 mg by mouth 2 (two) times daily.   levothyroxine 75 MCG tablet Commonly known as:  SYNTHROID, LEVOTHROID Take 75 mcg by mouth daily before breakfast.   memantine 28 MG Cp24 24 hr capsule Commonly known as:  NAMENDA XR Take 28 mg by mouth daily.   polyethylene glycol packet Commonly known as:  MIRALAX / GLYCOLAX Take 17 g by mouth every other day.   potassium chloride SA 20 MEQ tablet Commonly known as:  K-DUR,KLOR-CON Take 20 mEq by mouth 2 (two) times daily.   PRESERVISION AREDS 2 Caps Take 1 capsule by mouth 2 (two) times daily.   protein supplement Powd Take 1 scoop by mouth. 1 scoop twice a day due to low albumin   sennosides-docusate sodium 8.6-50 MG tablet  Commonly known as:  SENOKOT-S Take 2 tablets by mouth at bedtime.   Vitamin D 2000 units tablet Take 4,000 Units by mouth daily.       Review of Systems  Constitutional: Positive for fatigue. Negative for activity change, appetite change, chills and fever.  HENT: Positive for hearing loss. Negative for congestion, ear discharge and ear pain.   Eyes: Negative for pain, discharge and redness.       Lower eyelids redness, a sty seen right lower eyelids, mild ectropion noted, healed.  Respiratory: Positive for cough. Negative for shortness of breath and wheezing.   Cardiovascular: Negative for chest pain and leg swelling.  Gastrointestinal: Negative for abdominal pain, constipation, nausea and vomiting.  Genitourinary: Positive for frequency. Negative for dysuria and urgency.  Musculoskeletal: Positive for back pain and gait problem. Negative for myalgias and neck pain.  Skin: Negative for rash.  Neurological: Negative for dizziness, tremors, seizures, weakness and headaches.       Alzheimer's  Hematological:       Hx anemia  Psychiatric/Behavioral: Positive for confusion and decreased concentration. Negative for hallucinations and  suicidal ideas. The patient is nervous/anxious.     Immunization History  Administered Date(s) Administered  . Influenza, High Dose Seasonal PF 12/27/2013  . Influenza-Unspecified 01/12/2013, 01/02/2015, 01/29/2016  . Pneumococcal-Unspecified 07/01/2012  . Td 05/27/2004  . Tdap 02/21/2012  . Zoster 04/15/2007   Pertinent  Health Maintenance Due  Topic Date Due  . PNA vac Low Risk Adult (2 of 2 - PCV13) 07/01/2013  . INFLUENZA VACCINE  Completed  . DEXA SCAN  Completed   Fall Risk  05/22/2014 10/06/2013 06/02/2013  Falls in the past year? No No No  Risk for fall due to : - History of fall(s);Impaired balance/gait;Impaired mobility Impaired mobility   Functional Status Survey:    Vitals:   03/17/16 1202  BP: 138/90  Pulse: 68  Resp: 20  Temp: 98.4 F (36.9 C)  Weight: 135 lb 12.8 oz (61.6 kg)  Height: 5\' 1"  (1.549 m)   Body mass index is 25.66 kg/m. Physical Exam  Constitutional: She appears well-developed and well-nourished. No distress.  HENT:  Head: Normocephalic and atraumatic.  Right Ear: External ear normal.  Mouth/Throat: No oropharyngeal exudate.  Eyes: Conjunctivae and EOM are normal. Pupils are equal, round, and reactive to light. Right eye exhibits no discharge. Left eye exhibits no discharge. No scleral icterus.  lower eyelids redness, mild ectropion noted   Neck: Normal range of motion. Neck supple. No JVD present. No tracheal deviation present. No thyromegaly present.  Cardiovascular: Normal rate, regular rhythm, normal heart sounds and intact distal pulses.   No murmur heard. Pulmonary/Chest: Effort normal. No respiratory distress. She has no wheezes. She has rales. She exhibits no tenderness.  Abdominal: Soft. Bowel sounds are normal. She exhibits no distension and no mass. There is no tenderness.  Musculoskeletal: Normal range of motion. She exhibits tenderness. She exhibits no edema.  Right hip pain with ROM.  Neurological: She is alert. She has normal  reflexes. No cranial nerve deficit. She exhibits normal muscle tone. Coordination normal.  dementia  Skin: Skin is dry. No rash noted. She is not diaphoretic. No pallor.  Right hip surgical incision healed.   Psychiatric: She has a normal mood and affect. Her behavior is normal. Thought content normal. Her speech is not rapid and/or pressured, not delayed, not tangential and not slurred. Cognition and memory are impaired. She is communicative. She exhibits abnormal recent memory. She exhibits  normal remote memory.    Labs reviewed:  Recent Labs  04/07/15 1635 04/08/15 0733 04/09/15 0500  01/22/16 01/24/16 01/30/16  NA 139 138 138  < > 140 140 143  K 4.2 3.8 3.2*  < > 3.0* 3.1* 4.3  CL 104 98* 102  --   --   --   --   CO2 25 27 25   --   --   --   --   GLUCOSE 102* 146* 91  --   --   --   --   BUN 17 12 19   < > 24* 17 20  CREATININE 0.75 0.76 0.74  < > 1.0 0.9 0.8  CALCIUM 8.9 8.9 8.5*  --   --   --   --   < > = values in this interval not displayed.  Recent Labs  04/07/15 1635 04/09/15 0500  07/10/15 11/27/15 01/22/16  AST 16 13*  < > 12* 12* 18  ALT 9* 8*  < > 7 7 15   ALKPHOS 81 69  < > 55 57 121  BILITOT 0.7 0.5  --   --   --   --   PROT 6.2* 5.3*  --   --   --   --   ALBUMIN 3.0* 2.8*  --   --   --   --   < > = values in this interval not displayed.  Recent Labs  04/07/15 1635 04/08/15 0733 04/09/15 0500  07/10/15 11/27/15 01/22/16  WBC 8.5 13.7* 9.7  < > 6.2 7.5 13.8  NEUTROABS 5.7  --   --   --   --   --   --   HGB 12.0 13.1 11.1*  < > 11.9* 12.4 11.8*  HCT 38.1 41.1 35.2*  < > 36 37 35*  MCV 91.6 90.9 90.3  --   --   --   --   PLT 378 436* 371  < > 231 255 185  < > = values in this interval not displayed. Lab Results  Component Value Date   TSH 2.18 02/14/2016   Lab Results  Component Value Date   HGBA1C 5.8 (H) 07/06/2014   Lab Results  Component Value Date   CHOL 209 (H) 07/06/2014   HDL 66 07/06/2014   LDLCALC 111 (H) 07/06/2014   TRIG 160 (H)  07/06/2014   CHOLHDL 3.2 07/06/2014    Significant Diagnostic Results in last 30 days:  No results found.  Assessment/Plan Other chest pain Resolved chest pain 03/14/16 w/o intervention, she is afebrile, no O2 desaturation, will obtain CXR ap and lateral views, update CBC CMP UA C/S  Essential hypertension Controlled, continue Labetalol 100mg  bid. 01/31/16 Na 143, K 4.3, Bun 20, creat [id number],    Diastolic CHF, acute on chronic (HCC) Clinically compensated.     GERD Stable, continue Famotidine 20mg  daily.      Constipation Stable, continue Senokot S II nightly, MiraLax qod   Hypothyroidism TSH 2.18 02/14/16, continue Levothyroxine 43mcg qd   Dementia of the Alzheimer's type 05/19/14 MMSE 18/30, continue Namenda, SNF for care needs    Hypokalemia 01/30/16 K 4.3, continue Kcl     Family/ staff Communication: SNF  Labs/tests ordered:  CXR (AP and lateral views), EKG CBC CMP UA C/S

## 2016-03-17 NOTE — Assessment & Plan Note (Signed)
05/19/14 MMSE 18/30, continue Namenda, SNF for care needs 

## 2016-03-17 NOTE — Assessment & Plan Note (Signed)
Resolved chest pain 03/14/16 w/o intervention, she is afebrile, no O2 desaturation, will obtain CXR ap and lateral views, update CBC CMP UA C/S

## 2016-03-17 NOTE — Assessment & Plan Note (Signed)
TSH 2.18 02/14/16, continue Levothyroxine 17mcg qd

## 2016-03-17 NOTE — Assessment & Plan Note (Signed)
Stable, continue Famotidine 20mg daily.   

## 2016-03-17 NOTE — Assessment & Plan Note (Signed)
01/30/16 K 4.3, continue Kcl

## 2016-03-17 NOTE — Assessment & Plan Note (Signed)
Clinically compensated.

## 2016-03-18 DIAGNOSIS — R6889 Other general symptoms and signs: Secondary | ICD-10-CM | POA: Diagnosis not present

## 2016-03-18 DIAGNOSIS — G309 Alzheimer's disease, unspecified: Secondary | ICD-10-CM | POA: Diagnosis not present

## 2016-03-18 DIAGNOSIS — I10 Essential (primary) hypertension: Secondary | ICD-10-CM | POA: Diagnosis not present

## 2016-03-18 LAB — CBC AND DIFFERENTIAL
HEMATOCRIT: 35 % — AB (ref 36–46)
HEMOGLOBIN: 11.4 g/dL — AB (ref 12.0–16.0)
PLATELETS: 225 10*3/uL (ref 150–399)
WBC: 8.1 10*3/mL

## 2016-03-18 LAB — BASIC METABOLIC PANEL
BUN: 25 mg/dL — AB (ref 4–21)
Creatinine: 0.8 mg/dL (ref <=1.1)
GLUCOSE: 88 mg/dL
Potassium: 4 mmol/L (ref 3.4–5.3)
SODIUM: 145 mmol/L (ref 137–147)

## 2016-03-20 ENCOUNTER — Other Ambulatory Visit: Payer: Self-pay | Admitting: *Deleted

## 2016-03-27 ENCOUNTER — Non-Acute Institutional Stay (SKILLED_NURSING_FACILITY): Payer: Medicare Other | Admitting: Internal Medicine

## 2016-03-27 ENCOUNTER — Encounter: Payer: Self-pay | Admitting: Internal Medicine

## 2016-03-27 DIAGNOSIS — I10 Essential (primary) hypertension: Secondary | ICD-10-CM | POA: Diagnosis not present

## 2016-03-27 DIAGNOSIS — D649 Anemia, unspecified: Secondary | ICD-10-CM

## 2016-03-27 DIAGNOSIS — E782 Mixed hyperlipidemia: Secondary | ICD-10-CM

## 2016-03-27 DIAGNOSIS — G308 Other Alzheimer's disease: Secondary | ICD-10-CM

## 2016-03-27 DIAGNOSIS — F0280 Dementia in other diseases classified elsewhere without behavioral disturbance: Secondary | ICD-10-CM

## 2016-03-27 DIAGNOSIS — E039 Hypothyroidism, unspecified: Secondary | ICD-10-CM | POA: Diagnosis not present

## 2016-03-27 NOTE — Progress Notes (Signed)
Progress Note    Location:  [facility] Room Number: N57 Place of Service:  SNF [phone]) Provider:  Jeanmarie Hubert, MD  Patient Care Team: Estill Dooms, MD as PCP - General (Internal Medicine) Hurman Horn, MD as Consulting Physician (Ophthalmology) Burnell Blanks, MD as Consulting Physician (Cardiology) Inda Castle, MD as Consulting Physician (Gastroenterology) Jari Pigg, MD as Consulting Physician (Dermatology) Dorna Leitz, MD as Consulting Physician (Orthopedic Surgery) Man Otho Darner, NP as Nurse Practitioner (Internal Medicine)  Extended Emergency Contact Information Primary Emergency Contact: [facility] Address: [address]          [place], [place] [address] Johnnette Litter of [place] Phone: [phone] Relation: Daughter Secondary Emergency Contact: [place], [place], [place] [address] [place] Phone: [phone] Relation: Son  Code Status:  DNR Goals of care: Advanced Directive information Advanced Directives 03/17/2016  Does Patient Have a Medical Advance Directive? Yes  Type of Advance Directive Out of facility DNR (pink MOST or yellow form)  Does patient want to make changes to medical advance directive? No - Patient declined  Copy of [place] in Chart? Yes  Pre-existing out of facility DNR order (yellow form or pink MOST form) -     Chief Complaint  Patient presents with  . Medical Management of Chronic Issues    routine visit    HPI:  Pt is a 82 y.o. female seen today for medical management of chronic diseases.    Alzheimer's disease of other onset without behavioral disturbance - unchanged  Essential hypertension  controlled  Hyperlipidemia - controlled  Hypothyroidism, unspecified type - compensated  Anemia, unspecified type - mild and stable     Past Medical History:  Diagnosis Date  . Acute blood loss anemia 03/29/2014   03/30/14 Hgb 7.3 04/04/14 Hgb  8.5 continue Fe bid.  05/04/14 Hgb 10.8   . Benign neoplasm of colon   . Cancer ([place])    pre-melanoma  . Cystocele   . Dementia of the Alzheimer's type 05/22/2014   05/19/14 MMSE 18/30 [place].    . Diastolic dysfunction [id number]  . Diverticulosis of colon (without mention of hemorrhage)   . Diverticulosis of large intestine 06/28/2004  . Esophageal reflux   . Essential hypertension 07/05/2013  . Gastroparesis   . Hemorrhoids   . HTN (hypertension)   . Hyperlipidemia 07/05/2013  . Hypertension   . Hypothyroidism   . Internal hemorrhoids without mention of complication [id number]  . Macular degeneration   . Nonspecific abnormal electrocardiogram (ECG) (EKG)   . Osteoarthritis    right hip  . Other and unspecified hyperlipidemia   . Prediabetes 11/17/2013  . Primary osteoarthritis of right hip 03/27/2014  . Shingles   . Skin cancer (melanoma) ([place] [facility])   . Stricture and stenosis of esophagus   . Stricture and stenosis of esophagus 10/27/2007  . Vitamin D deficiency 11/17/2013   Past Surgical History:  Procedure Laterality Date  . APPENDECTOMY    . BCC resection     legs/face  . CATARACT EXTRACTION, BILATERAL    . CHOLECYSTECTOMY    . FLEXIBLE SIGMOIDOSCOPY  05/12/2011   Procedure: FLEXIBLE SIGMOIDOSCOPY;  Surgeon: Inda Castle, MD;  Location: [facility];  Service: Endoscopy;  Laterality: N/A;  . FLEXIBLE SIGMOIDOSCOPY  09/10/2011   Procedure: FLEXIBLE SIGMOIDOSCOPY;  Surgeon: Inda Castle, MD;  Location: [facility];  Service: Endoscopy;  Laterality: N/A;  .  FLEXIBLE SIGMOIDOSCOPY N/A 08/16/2012   Procedure: FLEXIBLE SIGMOIDOSCOPY;  Surgeon: Inda Castle, MD;  Location: [facility];  Service: Endoscopy;  Laterality: N/A;  . FRACTURE SURGERY     left hip pin  . GALLBLADDER SURGERY  2002  . HEMORRHOID BANDING N/A 08/16/2012   Procedure: HEMORRHOID BANDING;  Surgeon: Inda Castle, MD;  Location: [facility];  Service: Endoscopy;  Laterality: N/A;  . HEMORRHOID SURGERY    .  left eye surgery    . melenoma resection     right arm  . THYROIDECTOMY    . TONSILLECTOMY    . TOTAL HIP ARTHROPLASTY Right 03/27/2014   Procedure: TOTAL HIP ARTHROPLASTY ANTERIOR APPROACH;  Surgeon: Alta Corning, MD;  Location: [facility];  Service: Orthopedics;  Laterality: Right;    Allergies  Allergen Reactions  . Ace Inhibitors Other (See Comments)    Elevated potassium  . Statins Other (See Comments)    Myalgias   . Vicodin [Hydrocodone-Acetaminophen] Other (See Comments)    "felt spaced out"  . Fosamax [Alendronate Sodium] Other (See Comments)  . Metoclopramide Hcl Other (See Comments)  . Sulfa Antibiotics Rash  . Sulfonamide Derivatives Rash  . Vesicare [Solifenacin] Other (See Comments)      Medication List       Accurate as of 03/27/16 12:21 PM. Always use your most recent med list.          acetaminophen 500 MG tablet Commonly known as:  TYLENOL Take 1,000 mg by mouth 3 (three) times daily as needed for mild pain or moderate pain.   aspirin EC 81 MG tablet Take 81 mg by mouth daily.   CAL-GEST ANTACID 500 MG chewable tablet Generic drug:  calcium carbonate Chew 1 tablet by mouth daily as needed for indigestion or heartburn.   docusate sodium 100 MG capsule Commonly known as:  COLACE Take 100 mg by mouth 2 (two) times daily as needed for mild constipation.   famotidine 20 MG tablet Commonly known as:  PEPCID Take 20 mg by mouth at bedtime.   guaiFENesin 100 MG/5ML Soln Commonly known as:  ROBITUSSIN Take 10 mLs by mouth every 6 (six) hours as needed for cough or to loosen phlegm.   labetalol 100 MG tablet Commonly known as:  NORMODYNE Take 100 mg by mouth 2 (two) times daily.   levothyroxine 75 MCG tablet Commonly known as:  SYNTHROID, LEVOTHROID Take 75 mcg by mouth daily before breakfast.   memantine 28 MG Cp24 24 hr capsule Commonly known as:  NAMENDA XR Take 28 mg by mouth daily.   nystatin-triamcinolone cream Commonly known as:   MYCOLOG II Apply 1 application topically. Apply to affected area once a day   polyethylene glycol packet Commonly known as:  MIRALAX / GLYCOLAX Take 17 g by mouth every other day.   potassium chloride SA 20 MEQ tablet Commonly known as:  K-DUR,KLOR-CON Take by mouth. Take 2 tablets (40 meq) once a day   PRESERVISION AREDS 2 Caps Take 1 capsule by mouth 2 (two) times daily.   protein supplement Powd Take 1 scoop by mouth. 1 scoop twice a day due to low albumin   sennosides-docusate sodium 8.6-50 MG tablet Commonly known as:  SENOKOT-S Take 2 tablets by mouth at bedtime.   Vitamin D 2000 units tablet Take 4,000 Units by mouth daily.       Review of Systems  Constitutional: Positive for fatigue. Negative for activity change, appetite change, chills and fever.  HENT: Positive for  hearing loss. Negative for congestion, ear discharge and ear pain.   Eyes: Negative for pain, discharge and redness.  Respiratory: Positive for cough. Negative for shortness of breath and wheezing.   Cardiovascular: Negative for chest pain and leg swelling.  Gastrointestinal: Negative for abdominal pain, constipation, nausea and vomiting.  Genitourinary: Positive for frequency. Negative for dysuria and urgency.  Musculoskeletal: Positive for back pain and gait problem. Negative for myalgias and neck pain.  Skin: Negative for rash.  Neurological: Negative for dizziness, tremors, seizures, weakness and headaches.       Alzheimer's  Hematological:       Hx anemia  Psychiatric/Behavioral: Positive for confusion and decreased concentration. Negative for hallucinations and suicidal ideas. The patient is nervous/anxious.     Immunization History  Administered Date(s) Administered  . Influenza, High Dose Seasonal PF 12/27/2013  . Influenza-Unspecified 01/12/2013, 01/02/2015, 01/29/2016  . Pneumococcal-Unspecified 07/01/2012  . Td 05/27/2004  . Tdap 02/21/2012  . Zoster 04/15/2007   Pertinent  Health  Maintenance Due  Topic Date Due  . PNA vac Low Risk Adult (2 of 2 - PCV13) 07/01/2013  . INFLUENZA VACCINE  Completed  . DEXA SCAN  Completed   Fall Risk  05/22/2014 10/06/2013 06/02/2013  Falls in the past year? No No No  Risk for fall due to : - History of fall(s);Impaired balance/gait;Impaired mobility Impaired mobility     Vitals:   03/27/16 1211  BP: 138/90  Pulse: 65  Resp: 20  Temp: 98.4 F (36.9 C)  Weight: 137 lb (62.1 kg)  Height: 5' 1.2" (1.554 m)   Body mass index is 25.72 kg/m. Physical Exam  Constitutional: She appears well-developed and well-nourished. No distress.  HENT:  Head: Normocephalic and atraumatic.  Right Ear: External ear normal.  Mouth/Throat: No oropharyngeal exudate.  Eyes: Conjunctivae and EOM are normal. Pupils are equal, round, and reactive to light. Right eye exhibits no discharge. Left eye exhibits no discharge. No scleral icterus.  lower eyelids redness, mild ectropion noted   Neck: Normal range of motion. Neck supple. No JVD present. No tracheal deviation present. No thyromegaly present.  Cardiovascular: Normal rate, regular rhythm, normal heart sounds and intact distal pulses.   No murmur heard. Pulmonary/Chest: Effort normal. No respiratory distress. She has no wheezes. She has rales. She exhibits no tenderness.  Abdominal: Soft. Bowel sounds are normal. She exhibits no distension and no mass. There is no tenderness.  Musculoskeletal: Normal range of motion. She exhibits tenderness. She exhibits no edema.  Right hip pain with ROM.  Neurological: She is alert. She has normal reflexes. No cranial nerve deficit. She exhibits normal muscle tone. Coordination normal.  dementia  Skin: Skin is dry. No rash noted. She is not diaphoretic. No pallor.  Right hip surgical incision healed.   Psychiatric: She has a normal mood and affect. Her behavior is normal. Thought content normal. Her speech is not rapid and/or pressured, not delayed, not  tangential and not slurred. Cognition and memory are impaired. She is communicative. She exhibits abnormal recent memory. She exhibits normal remote memory.    Labs reviewed:  Recent Labs  04/07/15 1635 04/08/15 0733 04/09/15 0500  01/24/16 01/30/16 03/18/16  NA 139 138 138  < > 140 143 145  K 4.2 3.8 3.2*  < > 3.1* 4.3 4.0  CL 104 98* 102  --   --   --   --   CO2 25 27 25   --   --   --   --  GLUCOSE 102* 146* 91  --   --   --   --   BUN 17 12 19   < > 17 20 25*  CREATININE 0.75 0.76 0.74  < > 0.9 0.8 0.8  CALCIUM 8.9 8.9 8.5*  --   --   --   --   < > = values in this interval not displayed.  Recent Labs  04/07/15 1635 04/09/15 0500  07/10/15 11/27/15 01/22/16  AST 16 13*  < > 12* 12* 18  ALT 9* 8*  < > 7 7 15   ALKPHOS 81 69  < > 55 57 121  BILITOT 0.7 0.5  --   --   --   --   PROT 6.2* 5.3*  --   --   --   --   ALBUMIN 3.0* 2.8*  --   --   --   --   < > = values in this interval not displayed.  Recent Labs  04/07/15 1635 04/08/15 0733 04/09/15 0500  11/27/15 01/22/16 03/18/16  WBC 8.5 13.7* 9.7  < > 7.5 13.8 8.1  NEUTROABS 5.7  --   --   --   --   --   --   HGB 12.0 13.1 11.1*  < > 12.4 11.8* 11.4*  HCT 38.1 41.1 35.2*  < > 37 35* 35*  MCV 91.6 90.9 90.3  --   --   --   --   PLT 378 436* 371  < > 255 185 225  < > = values in this interval not displayed. Lab Results  Component Value Date   TSH 2.18 02/14/2016   Lab Results  Component Value Date   HGBA1C 5.8 (H) 07/06/2014   Lab Results  Component Value Date   CHOL 209 (H) 07/06/2014   HDL 66 07/06/2014   LDLCALC 111 (H) 07/06/2014   TRIG 160 (H) 07/06/2014   CHOLHDL 3.2 07/06/2014     Assessment/Plan 1. Alzheimer's disease of other onset without behavioral disturbance unchanged  2. Essential hypertension controlled  3. Hyperlipidemia controlled  4. Hypothyroidism, unspecified type compensated  5. Anemia, unspecified type Mild and stable

## 2016-03-31 DIAGNOSIS — H353221 Exudative age-related macular degeneration, left eye, with active choroidal neovascularization: Secondary | ICD-10-CM | POA: Diagnosis not present

## 2016-04-19 DIAGNOSIS — N390 Urinary tract infection, site not specified: Secondary | ICD-10-CM | POA: Diagnosis not present

## 2016-04-23 ENCOUNTER — Encounter: Payer: Self-pay | Admitting: Nurse Practitioner

## 2016-04-23 ENCOUNTER — Non-Acute Institutional Stay (SKILLED_NURSING_FACILITY): Payer: Medicare Other | Admitting: Nurse Practitioner

## 2016-04-23 DIAGNOSIS — F0280 Dementia in other diseases classified elsewhere without behavioral disturbance: Secondary | ICD-10-CM

## 2016-04-23 DIAGNOSIS — I5033 Acute on chronic diastolic (congestive) heart failure: Secondary | ICD-10-CM | POA: Diagnosis not present

## 2016-04-23 DIAGNOSIS — G308 Other Alzheimer's disease: Secondary | ICD-10-CM

## 2016-04-23 DIAGNOSIS — K5900 Constipation, unspecified: Secondary | ICD-10-CM | POA: Diagnosis not present

## 2016-04-23 DIAGNOSIS — I10 Essential (primary) hypertension: Secondary | ICD-10-CM | POA: Diagnosis not present

## 2016-04-23 DIAGNOSIS — N390 Urinary tract infection, site not specified: Secondary | ICD-10-CM | POA: Diagnosis not present

## 2016-04-23 DIAGNOSIS — E039 Hypothyroidism, unspecified: Secondary | ICD-10-CM

## 2016-04-23 DIAGNOSIS — K219 Gastro-esophageal reflux disease without esophagitis: Secondary | ICD-10-CM | POA: Diagnosis not present

## 2016-04-23 DIAGNOSIS — D649 Anemia, unspecified: Secondary | ICD-10-CM

## 2016-04-23 NOTE — Assessment & Plan Note (Signed)
04/22/16 urine culture showed E. Coli >100,000c/ml, 7 day course of Augmentin 875mg  bid to be completed.

## 2016-04-23 NOTE — Assessment & Plan Note (Addendum)
Controlled, continue Labetalol 100mg  bid.  03/18/16 wbc 8.1, Hgb 11.4, Plt 225, na 145, K 4.0, Bun 25, creat 0.81

## 2016-04-23 NOTE — Assessment & Plan Note (Signed)
Stable, continue Senokot S II nightly, MiraLax qod

## 2016-04-23 NOTE — Assessment & Plan Note (Signed)
TSH 2.18 02/14/16, continue Levothyroxine 49mcg qd

## 2016-04-23 NOTE — Assessment & Plan Note (Signed)
Clinically compensated.

## 2016-04-23 NOTE — Assessment & Plan Note (Signed)
05/19/14 MMSE 18/30, continue Namenda, SNF for care needs 

## 2016-04-23 NOTE — Progress Notes (Signed)
Location:  [facility] Room Number: 73 Place of Service:  SNF (31) Provider:  Tellis Spivak, Manxie  NP  Jeanmarie Hubert, MD  Patient Care Team: Estill Dooms, MD as PCP - General (Internal Medicine) Hurman Horn, MD as Consulting Physician (Ophthalmology) Burnell Blanks, MD as Consulting Physician (Cardiology) Inda Castle, MD as Consulting Physician (Gastroenterology) Jari Pigg, MD as Consulting Physician (Dermatology) Dorna Leitz, MD as Consulting Physician (Orthopedic Surgery) Prescott Truex Otho Darner, NP as Nurse Practitioner (Internal Medicine)  Extended Emergency Contact Information Primary Emergency Contact: [facility] Address: [address]          [place], [place] [address] Johnnette Litter of [place] Phone: [phone] Relation: Daughter Secondary Emergency Contact: Luana, [address] [place], [place] [address] Johnnette Litter of [place] Phone: [phone] Relation: Son  Code Status:  DNR Goals of care: Advanced Directive information Advanced Directives 04/23/2016  Does Patient Have a Medical Advance Directive? Yes  Type of Advance Directive Out of facility DNR (pink MOST or yellow form)  Does patient want to make changes to medical advance directive? No - Patient declined  Copy of [place] in Chart? Yes  Pre-existing out of facility DNR order (yellow form or pink MOST form) Yellow form placed in chart (order not valid for inpatient use)     Chief Complaint  Patient presents with  . Acute Visit    UTI    HPI:  Pt is a 83 y.o. female seen today for an acute visit for UTI, 04/22/16 urine culture showed E. Coli >100,000c/ml, 7 day course of Augmentin 875mg  bid to be completed.   Hx of GERD asymptomatic, takes Famotidine 20mg  daily, last TSH 2.18 02/14/16, on Levothyroxine 34mcg, controlled blood pressure while taking Labetalol 100mg  bid, taking Namenda for memory loss, she is functioning adequately in SNF. Denied pain,  constipation, or trouble sleeping at night.   04/07/15-04/09/15 hospitalized, A Chest X-ray revealed a Bilateral Pleuarl Effusions, the left, s/p thorocentesis.   01/22/16 CXR mild pathcy bibasilar densities, L>R, wbc 13.8, Hgb 11.8, Na 140, K 3.0, Bun 24, creat 0.95, TP 5.0, albumin 2.8, Levaquin 750mg  daily x 5  Past Medical History:  Diagnosis Date  . Acute blood loss anemia 03/29/2014   03/30/14 Hgb 7.3 04/04/14 Hgb 8.5 continue Fe bid.  05/04/14 Hgb 10.8   . Benign neoplasm of colon   . Cancer ([place])    pre-melanoma  . Cystocele   . Dementia of the Alzheimer's type 05/22/2014   05/19/14 MMSE 18/30 Namenda.    . Diastolic dysfunction [id number]  . Diverticulosis of colon (without mention of hemorrhage)   . Diverticulosis of large intestine 06/28/2004  . Esophageal reflux   . Essential hypertension 07/05/2013  . Gastroparesis   . Hemorrhoids   . HTN (hypertension)   . Hyperlipidemia 07/05/2013  . Hypertension   . Hypothyroidism   . Internal hemorrhoids without mention of complication [id number]  . Macular degeneration   . Nonspecific abnormal electrocardiogram (ECG) (EKG)   . Osteoarthritis    right hip  . Other and unspecified hyperlipidemia   . Prediabetes 11/17/2013  . Primary osteoarthritis of right hip 03/27/2014  . Shingles   . Skin cancer (melanoma) ([place])   . Stricture and stenosis of esophagus   . Stricture and stenosis of esophagus 10/27/2007  . Vitamin D deficiency 11/17/2013   Past Surgical History:  Procedure Laterality Date  . APPENDECTOMY    . Patrick  resection     legs/face  . CATARACT EXTRACTION, BILATERAL    . CHOLECYSTECTOMY    . FLEXIBLE SIGMOIDOSCOPY  05/12/2011   Procedure: FLEXIBLE SIGMOIDOSCOPY;  Surgeon: Inda Castle, MD;  Location: [facility];  Service: Endoscopy;  Laterality: N/A;  . FLEXIBLE SIGMOIDOSCOPY  09/10/2011   Procedure: FLEXIBLE SIGMOIDOSCOPY;  Surgeon: Inda Castle, MD;  Location: [facility];  Service: Endoscopy;  Laterality: N/A;  .  FLEXIBLE SIGMOIDOSCOPY N/A 08/16/2012   Procedure: FLEXIBLE SIGMOIDOSCOPY;  Surgeon: Inda Castle, MD;  Location: [facility];  Service: Endoscopy;  Laterality: N/A;  . FRACTURE SURGERY     left hip pin  . GALLBLADDER SURGERY  2002  . HEMORRHOID BANDING N/A 08/16/2012   Procedure: HEMORRHOID BANDING;  Surgeon: Inda Castle, MD;  Location: [facility];  Service: Endoscopy;  Laterality: N/A;  . HEMORRHOID SURGERY    . left eye surgery    . melenoma resection     right arm  . THYROIDECTOMY    . TONSILLECTOMY    . TOTAL HIP ARTHROPLASTY Right 03/27/2014   Procedure: TOTAL HIP ARTHROPLASTY ANTERIOR APPROACH;  Surgeon: Alta Corning, MD;  Location: [facility];  Service: Orthopedics;  Laterality: Right;    Allergies  Allergen Reactions  . Ace Inhibitors Other (See Comments)    Elevated potassium  . Statins Other (See Comments)    Myalgias   . Vicodin [Hydrocodone-Acetaminophen] Other (See Comments)    "felt spaced out"  . Fosamax [Alendronate Sodium] Other (See Comments)  . Metoclopramide Hcl Other (See Comments)  . Sulfa Antibiotics Rash  . Sulfonamide Derivatives Rash  . Vesicare [Solifenacin] Other (See Comments)    Allergies as of 04/23/2016      Reactions   Ace Inhibitors Other (See Comments)   Elevated potassium   Statins Other (See Comments)   Myalgias   Vicodin [hydrocodone-acetaminophen] Other (See Comments)   "felt spaced out"   Fosamax [alendronate Sodium] Other (See Comments)   Metoclopramide Hcl Other (See Comments)   Sulfa Antibiotics Rash   Sulfonamide Derivatives Rash   Vesicare [solifenacin] Other (See Comments)      Medication List       Accurate as of 04/23/16  2:33 PM. Always use your most recent med list.          acetaminophen 500 MG tablet Commonly known as:  TYLENOL Take 1,000 mg by mouth 3 (three) times daily as needed for mild pain or moderate pain.   amoxicillin-clavulanate 875-125 MG tablet Commonly known as:  AUGMENTIN Take 1 tablet by  mouth 2 (two) times daily.   aspirin EC 81 MG tablet Take 81 mg by mouth daily.   CAL-GEST ANTACID 500 MG chewable tablet Generic drug:  calcium carbonate Chew 1 tablet by mouth daily as needed for indigestion or heartburn.   docusate sodium 100 MG capsule Commonly known as:  COLACE Take 100 mg by mouth 2 (two) times daily as needed for mild constipation.   famotidine 20 MG tablet Commonly known as:  PEPCID Take 20 mg by mouth at bedtime.   guaiFENesin 100 MG/5ML Soln Commonly known as:  ROBITUSSIN Take 10 mLs by mouth every 6 (six) hours as needed for cough or to loosen phlegm.   labetalol 100 MG tablet Commonly known as:  NORMODYNE Take 100 mg by mouth 2 (two) times daily.   levothyroxine 75 MCG tablet Commonly known as:  SYNTHROID, LEVOTHROID Take 75 mcg by mouth daily before breakfast.   memantine 28 MG Cp24 24  hr capsule Commonly known as:  NAMENDA XR Take 28 mg by mouth daily.   nystatin-triamcinolone cream Commonly known as:  MYCOLOG II Apply 1 application topically. Apply to affected area once a day   oseltamivir 75 MG capsule Commonly known as:  TAMIFLU Take 75 mg by mouth daily.   polyethylene glycol packet Commonly known as:  MIRALAX / GLYCOLAX Take 17 g by mouth every other day.   potassium chloride SA 20 MEQ tablet Commonly known as:  K-DUR,KLOR-CON Take by mouth. Take 2 tablets (40 meq) once a day   PRESERVISION AREDS 2 Caps Take 1 capsule by mouth 2 (two) times daily.   protein supplement Powd Take 1 scoop by mouth. 1 scoop twice a day due to low albumin   saccharomyces boulardii 250 MG capsule Commonly known as:  FLORASTOR Take 250 mg by mouth 2 (two) times daily.   sennosides-docusate sodium 8.6-50 MG tablet Commonly known as:  SENOKOT-S Take 2 tablets by mouth at bedtime.   Vitamin D 2000 units tablet Take 4,000 Units by mouth daily.       Review of Systems  Constitutional: Positive for fatigue. Negative for activity change,  appetite change, chills and fever.  HENT: Positive for hearing loss. Negative for congestion, ear discharge and ear pain.   Eyes: Negative for pain, discharge and redness.  Respiratory: Positive for cough. Negative for shortness of breath and wheezing.   Cardiovascular: Negative for chest pain and leg swelling.  Gastrointestinal: Negative for abdominal pain, constipation, nausea and vomiting.  Genitourinary: Positive for frequency. Negative for dysuria and urgency.  Musculoskeletal: Positive for back pain and gait problem. Negative for myalgias and neck pain.  Skin: Negative for rash.  Neurological: Negative for dizziness, tremors, seizures, weakness and headaches.       Alzheimer's  Hematological:       Hx anemia  Psychiatric/Behavioral: Positive for confusion and decreased concentration. Negative for hallucinations and suicidal ideas. The patient is nervous/anxious.     Immunization History  Administered Date(s) Administered  . Influenza, High Dose Seasonal PF 12/27/2013  . Influenza-Unspecified 01/12/2013, 01/02/2015, 01/29/2016  . Pneumococcal-Unspecified 07/01/2012  . Td 05/27/2004  . Tdap 02/21/2012  . Zoster 04/15/2007   Pertinent  Health Maintenance Due  Topic Date Due  . PNA vac Low Risk Adult (2 of 2 - PCV13) 07/01/2013  . INFLUENZA VACCINE  Completed  . DEXA SCAN  Completed   Fall Risk  05/22/2014 10/06/2013 06/02/2013  Falls in the past year? No No No  Risk for fall due to : - History of fall(s);Impaired balance/gait;Impaired mobility Impaired mobility   Functional Status Survey:    Vitals:   04/23/16 1400  BP: 140/77  Pulse: 75  Resp: 18  Temp: 98.4 F (36.9 C)  Weight: 138 lb (62.6 kg)  Height: 5\' 1"  (1.549 m)   Body mass index is 26.07 kg/m. Physical Exam  Constitutional: She appears well-developed and well-nourished. No distress.  HENT:  Head: Normocephalic and atraumatic.  Right Ear: External ear normal.  Mouth/Throat: No oropharyngeal exudate.    Eyes: Conjunctivae and EOM are normal. Pupils are equal, round, and reactive to light. Right eye exhibits no discharge. Left eye exhibits no discharge. No scleral icterus.  lower eyelids redness, mild ectropion noted   Neck: Normal range of motion. Neck supple. No JVD present. No tracheal deviation present. No thyromegaly present.  Cardiovascular: Normal rate, regular rhythm, normal heart sounds and intact distal pulses.   No murmur heard. Pulmonary/Chest: Effort normal. No  respiratory distress. She has no wheezes. She has rales. She exhibits no tenderness.  Abdominal: Soft. Bowel sounds are normal. She exhibits no distension and no mass. There is no tenderness.  Musculoskeletal: Normal range of motion. She exhibits tenderness. She exhibits no edema.  Right hip pain with ROM.  Neurological: She is alert. She has normal reflexes. No cranial nerve deficit. She exhibits normal muscle tone. Coordination normal.  dementia  Skin: Skin is dry. No rash noted. She is not diaphoretic. No pallor.  Right hip surgical incision healed.   Psychiatric: She has a normal mood and affect. Her behavior is normal. Thought content normal. Her speech is not rapid and/or pressured, not delayed, not tangential and not slurred. Cognition and memory are impaired. She is communicative. She exhibits abnormal recent memory. She exhibits normal remote memory.    Labs reviewed:  Recent Labs  01/24/16 01/30/16 03/18/16  NA 140 143 145  K 3.1* 4.3 4.0  BUN 17 20 25*  CREATININE 0.9 0.8 0.8    Recent Labs  07/10/15 11/27/15 01/22/16  AST 12* 12* 18  ALT 7 7 15   ALKPHOS 55 57 121    Recent Labs  11/27/15 01/22/16 03/18/16  WBC 7.5 13.8 8.1  HGB 12.4 11.8* 11.4*  HCT 37 35* 35*  PLT 255 185 225   Lab Results  Component Value Date   TSH 2.18 02/14/2016   Lab Results  Component Value Date   HGBA1C 5.8 (H) 07/06/2014   Lab Results  Component Value Date   CHOL 209 (H) 07/06/2014   HDL 66 07/06/2014    LDLCALC 111 (H) 07/06/2014   TRIG 160 (H) 07/06/2014   CHOLHDL 3.2 07/06/2014    Significant Diagnostic Results in last 30 days:  No results found.  Assessment/Plan UTI (urinary tract infection) 04/22/16 urine culture showed E. Coli >100,000c/ml, 7 day course of Augmentin 875mg  bid to be completed.    Essential hypertension Controlled, continue Labetalol 100mg  bid.  03/18/16 wbc 8.1, Hgb 11.4, Plt 225, na 145, K 4.0, Bun 25, creat 123XX123  Diastolic CHF, acute on chronic (HCC) Clinically compensated.     GERD Stable, continue Famotidine 20mg  daily.      Constipation Stable, continue Senokot S II nightly, MiraLax qod   Hypothyroidism TSH 2.18 02/14/16, continue Levothyroxine 28mcg qd   Dementia of the Alzheimer's type 05/19/14 MMSE 18/30, continue Namenda, SNF for care needs    Anemia 03/18/16 Hgb 11.4     Family/ staff Communication: SNF  Labs/tests ordered:  Urine culture done 04/22/16

## 2016-04-23 NOTE — Assessment & Plan Note (Signed)
03/18/16 Hgb 11.4

## 2016-04-23 NOTE — Assessment & Plan Note (Signed)
Stable, continue Famotidine 20mg daily.   

## 2016-05-12 DIAGNOSIS — H353134 Nonexudative age-related macular degeneration, bilateral, advanced atrophic with subfoveal involvement: Secondary | ICD-10-CM | POA: Diagnosis not present

## 2016-05-12 DIAGNOSIS — H353221 Exudative age-related macular degeneration, left eye, with active choroidal neovascularization: Secondary | ICD-10-CM | POA: Diagnosis not present

## 2016-05-21 ENCOUNTER — Non-Acute Institutional Stay (SKILLED_NURSING_FACILITY): Payer: Medicare Other | Admitting: Nurse Practitioner

## 2016-05-21 ENCOUNTER — Encounter: Payer: Self-pay | Admitting: Nurse Practitioner

## 2016-05-21 DIAGNOSIS — D649 Anemia, unspecified: Secondary | ICD-10-CM | POA: Diagnosis not present

## 2016-05-21 DIAGNOSIS — K219 Gastro-esophageal reflux disease without esophagitis: Secondary | ICD-10-CM

## 2016-05-21 DIAGNOSIS — K5900 Constipation, unspecified: Secondary | ICD-10-CM | POA: Diagnosis not present

## 2016-05-21 DIAGNOSIS — G308 Other Alzheimer's disease: Secondary | ICD-10-CM

## 2016-05-21 DIAGNOSIS — I5033 Acute on chronic diastolic (congestive) heart failure: Secondary | ICD-10-CM | POA: Diagnosis not present

## 2016-05-21 DIAGNOSIS — E039 Hypothyroidism, unspecified: Secondary | ICD-10-CM | POA: Diagnosis not present

## 2016-05-21 DIAGNOSIS — F0280 Dementia in other diseases classified elsewhere without behavioral disturbance: Secondary | ICD-10-CM

## 2016-05-21 DIAGNOSIS — I10 Essential (primary) hypertension: Secondary | ICD-10-CM | POA: Diagnosis not present

## 2016-05-21 NOTE — Progress Notes (Signed)
Location:  [facility] Room Number: 23 Place of Service:  SNF (31) Provider:  Garritt Molyneux, Manxie  NP  Jeanmarie Hubert, MD  Patient Care Team: Estill Dooms, MD as PCP - General (Internal Medicine) Hurman Horn, MD as Consulting Physician (Ophthalmology) Burnell Blanks, MD as Consulting Physician (Cardiology) Inda Castle, MD as Consulting Physician (Gastroenterology) Jari Pigg, MD as Consulting Physician (Dermatology) Dorna Leitz, MD as Consulting Physician (Orthopedic Surgery) Sandrina Heaton Otho Darner, NP as Nurse Practitioner (Internal Medicine)  Extended Emergency Contact Information Primary Emergency Contact: [facility] Address: [address]          [place], [place] [address] Johnnette Litter of [place] Phone: [phone] Relation: Daughter Secondary Emergency Contact: [place], [place], [place] [address] Johnnette Litter of [place] Phone: [phone] Relation: Son  Code Status:  DNR Goals of care: Advanced Directive information Advanced Directives 05/21/2016  Does Patient Have a Medical Advance Directive? Yes  Type of Advance Directive Out of facility DNR (pink MOST or yellow form)  Does patient want to make changes to medical advance directive? No - Patient declined  Copy of Isabel in Chart? -  Pre-existing out of facility DNR order (yellow form or pink MOST form) Yellow form placed in chart (order not valid for inpatient use)     Chief Complaint  Patient presents with  . Medical Management of Chronic Issues    HPI:  Pt is a 83 y.o. female seen today for medical management of chronic diseases.    Hx of GERD asymptomatic, takes Famotidine 20mg  daily, last TSH 2.18 02/14/16, on Levothyroxine 23mcg, controlled blood pressure while taking Labetalol 100mg  bid, taking Namenda for memory loss, she is functioning adequately in SNF. Denied pain, constipation, or trouble sleeping at night. Takes Kcl 76meq, last K 4.0  03/18/16  04/07/15-04/09/15 hospitalized, A Chest X-ray revealed a Bilateral Pleuarl Effusions, the left, s/p thorocentesis.     Past Medical History:  Diagnosis Date  . Acute blood loss anemia 03/29/2014   03/30/14 Hgb 7.3 04/04/14 Hgb 8.5 continue Fe bid.  05/04/14 Hgb 10.8   . Benign neoplasm of colon   . Cancer ([facility])    pre-melanoma  . Cystocele   . Dementia of the Alzheimer's type 05/22/2014   05/19/14 MMSE 18/30 Namenda.    . Diastolic dysfunction [id number]  . Diverticulosis of colon (without mention of hemorrhage)   . Diverticulosis of large intestine 06/28/2004  . Esophageal reflux   . Essential hypertension 07/05/2013  . Gastroparesis   . Hemorrhoids   . HTN (hypertension)   . Hyperlipidemia 07/05/2013  . Hypertension   . Hypothyroidism   . Internal hemorrhoids without mention of complication [id number]  . Macular degeneration   . Nonspecific abnormal electrocardiogram (ECG) (EKG)   . Osteoarthritis    right hip  . Other and unspecified hyperlipidemia   . Prediabetes 11/17/2013  . Primary osteoarthritis of right hip 03/27/2014  . Shingles   . Skin cancer (melanoma) ([facility])   . Stricture and stenosis of esophagus   . Stricture and stenosis of esophagus 10/27/2007  . Vitamin D deficiency 11/17/2013   Past Surgical History:  Procedure Laterality Date  . APPENDECTOMY    . BCC resection     legs/face  . CATARACT EXTRACTION, BILATERAL    . CHOLECYSTECTOMY    . FLEXIBLE SIGMOIDOSCOPY  05/12/2011   Procedure: FLEXIBLE SIGMOIDOSCOPY;  Surgeon: Inda Castle, MD;  Location: [facility];  Service: Endoscopy;  Laterality: N/A;  . FLEXIBLE SIGMOIDOSCOPY  09/10/2011   Procedure: FLEXIBLE SIGMOIDOSCOPY;  Surgeon: Inda Castle, MD;  Location: [facility] END[facility]COPY;  Service: Endoscopy;  Laterality: N/A;  . FLEXIBLE SIGMOIDOSCOPY N/A 08/16/2012   Procedure: FLEXIBLE SIGMOIDOSCOPY;  Surgeon: Inda Castle, MD;  Location: [facility] ENDOSCOPY;  Service: Endoscopy;  Laterality: N/A;  . FRACTURE SURGERY      left hip pin  . GALLBLADDER SURGERY  2002  . HEMORRHOID BANDING N/A 08/16/2012   Procedure: HEMORRHOID BANDING;  Surgeon: Inda Castle, MD;  Location: [facility] ENDOSCOPY;  Service: Endoscopy;  Laterality: N/A;  . HEMORRHOID SURGERY    . left eye surgery    . melenoma resection     right arm  . THYROIDECTOMY    . TONSILLECTOMY    . TOTAL HIP ARTHROPLASTY Right 03/27/2014   Procedure: TOTAL HIP ARTHROPLASTY ANTERIOR APPROACH;  Surgeon: Alta Corning, MD;  Location: [facility];  Service: Orthopedics;  Laterality: Right;    Allergies  Allergen Reactions  . Ace Inhibitors Other (See Comments)    Elevated potassium  . Statins Other (See Comments)    Myalgias   . Vicodin [Hydrocodone-Acetaminophen] Other (See Comments)    "felt spaced out"  . Fosamax [Alendronate Sodium] Other (See Comments)  . Metoclopramide Hcl Other (See Comments)  . Sulfa Antibiotics Rash  . Sulfonamide Derivatives Rash  . Vesicare [Solifenacin] Other (See Comments)    Allergies as of 05/21/2016      Reactions   Ace Inhibitors Other (See Comments)   Elevated potassium   Statins Other (See Comments)   Myalgias   Vicodin [hydrocodone-acetaminophen] Other (See Comments)   "felt spaced out"   Fosamax [alendronate Sodium] Other (See Comments)   Metoclopramide Hcl Other (See Comments)   Sulfa Antibiotics Rash   Sulfonamide Derivatives Rash   Vesicare [solifenacin] Other (See Comments)      Medication List       Accurate as of 05/21/16  1:44 PM. Always use your most recent med list.          acetaminophen 500 MG tablet Commonly known as:  TYLENOL Take 1,000 mg by mouth 3 (three) times daily as needed for mild pain or moderate pain.   aspirin EC 81 MG tablet Take 81 mg by mouth daily.   CAL-GEST ANTACID 500 MG chewable tablet Generic drug:  calcium carbonate Chew 1 tablet by mouth daily as needed for indigestion or heartburn.   docusate sodium 100 MG capsule Commonly known as:  COLACE Take 100 mg by mouth  2 (two) times daily as needed for mild constipation.   famotidine 20 MG tablet Commonly known as:  PEPCID Take 20 mg by mouth at bedtime.   guaiFENesin 100 MG/5ML Soln Commonly known as:  ROBITUSSIN Take 10 mLs by mouth every 6 (six) hours as needed for cough or to loosen phlegm.   labetalol 100 MG tablet Commonly known as:  NORMODYNE Take 100 mg by mouth 2 (two) times daily.   levothyroxine 75 MCG tablet Commonly known as:  SYNTHROID, LEVOTHROID Take 75 mcg by mouth daily before breakfast.   memantine 28 MG Cp24 24 hr capsule Commonly known as:  NAMENDA XR Take 28 mg by mouth daily.   nystatin-triamcinolone cream Commonly known as:  MYCOLOG II Apply 1 application topically. Apply to affected area once a day   polyethylene glycol packet Commonly known as:  MIRALAX / GLYCOLAX Take 17 g by mouth every other day.   potassium  chloride SA 20 MEQ tablet Commonly known as:  K-DUR,KLOR-CON Take by mouth. Take 2 tablets (40 meq) once a day   PRESERVISION AREDS 2 Caps Take 1 capsule by mouth 2 (two) times daily.   protein supplement Powd Take 1 scoop by mouth. 1 scoop twice a day due to low albumin   saccharomyces boulardii 250 MG capsule Commonly known as:  FLORASTOR Take 250 mg by mouth 2 (two) times daily.   sennosides-docusate sodium 8.6-50 MG tablet Commonly known as:  SENOKOT-S Take 2 tablets by mouth at bedtime.   Vitamin D 2000 units tablet Take 4,000 Units by mouth daily.       Review of Systems  Constitutional: Positive for fatigue. Negative for activity change, appetite change, chills and fever.  HENT: Positive for hearing loss. Negative for congestion, ear discharge and ear pain.   Eyes: Negative for pain, discharge and redness.  Respiratory: Negative for cough, shortness of breath and wheezing.   Cardiovascular: Negative for chest pain and leg swelling.  Gastrointestinal: Negative for abdominal pain, constipation, nausea and vomiting.  Genitourinary:  Positive for frequency. Negative for dysuria and urgency.  Musculoskeletal: Positive for back pain and gait problem. Negative for myalgias and neck pain.  Skin: Negative for rash.  Neurological: Negative for dizziness, tremors, seizures, weakness and headaches.       Alzheimer's  Hematological:       Hx anemia  Psychiatric/Behavioral: Positive for confusion and decreased concentration. Negative for hallucinations and suicidal ideas. The patient is nervous/anxious.     Immunization History  Administered Date(s) Administered  . Influenza, High Dose Seasonal PF 12/27/2013  . Influenza-Unspecified 01/12/2013, 01/02/2015, 01/29/2016  . Pneumococcal-Unspecified 07/01/2012  . Td 05/27/2004  . Tdap 02/21/2012  . Zoster 04/15/2007   Pertinent  Health Maintenance Due  Topic Date Due  . PNA vac Low Risk Adult (2 of 2 - PCV13) 07/01/2013  . INFLUENZA VACCINE  Completed  . DEXA SCAN  Completed   Fall Risk  05/22/2014 10/06/2013 06/02/2013  Falls in the past year? No No No  Risk for fall due to : - History of fall(s);Impaired balance/gait;Impaired mobility Impaired mobility   Functional Status Survey:    Vitals:   05/21/16 1318  BP: 130/80  Pulse: 72  Resp: 18  Temp: 98.4 F (36.9 C)  Weight: 134 lb 6.4 oz (61 kg)  Height: 5\' 1"  (1.549 m)   Body mass index is 25.39 kg/m. Physical Exam  Constitutional: She appears well-developed and well-nourished. No distress.  HENT:  Head: Normocephalic and atraumatic.  Right Ear: External ear normal.  Mouth/Throat: No oropharyngeal exudate.  Eyes: Conjunctivae and EOM are normal. Pupils are equal, round, and reactive to light. Right eye exhibits no discharge. Left eye exhibits no discharge. No scleral icterus.  lower eyelids redness, mild ectropion noted   Neck: Normal range of motion. Neck supple. No JVD present. No tracheal deviation present. No thyromegaly present.  Cardiovascular: Normal rate, regular rhythm, normal heart sounds and intact  distal pulses.   No murmur heard. Pulmonary/Chest: Effort normal. No respiratory distress. She has no wheezes. She has rales. She exhibits no tenderness.  Abdominal: Soft. Bowel sounds are normal. She exhibits no distension and no mass. There is no tenderness.  Musculoskeletal: Normal range of motion. She exhibits no edema or tenderness.  Right hip pain with ROM.  Neurological: She is alert. She has normal reflexes. No cranial nerve deficit. She exhibits normal muscle tone. Coordination normal.  dementia  Skin: Skin is dry.  No rash noted. She is not diaphoretic. No pallor.  Right hip surgical incision healed.   Psychiatric: She has a normal mood and affect. Her behavior is normal. Thought content normal. Her speech is not rapid and/or pressured, not delayed, not tangential and not slurred. Cognition and memory are impaired. She is communicative. She exhibits abnormal recent memory. She exhibits normal remote memory.    Labs reviewed:  Recent Labs  01/24/16 01/30/16 03/18/16  NA 140 143 145  K 3.1* 4.3 4.0  BUN 17 20 25*  CREATININE 0.9 0.8 0.8    Recent Labs  07/10/15 11/27/15 01/22/16  AST 12* 12* 18  ALT 7 7 15   ALKPHOS 55 57 121    Recent Labs  11/27/15 01/22/16 03/18/16  WBC 7.5 13.8 8.1  HGB 12.4 11.8* 11.4*  HCT 37 35* 35*  PLT 255 185 225   Lab Results  Component Value Date   TSH 2.18 02/14/2016   Lab Results  Component Value Date   HGBA1C 5.8 (H) 07/06/2014   Lab Results  Component Value Date   CHOL 209 (H) 07/06/2014   HDL 66 07/06/2014   LDLCALC 111 (H) 07/06/2014   TRIG 160 (H) 07/06/2014   CHOLHDL 3.2 07/06/2014    Significant Diagnostic Results in last 30 days:  No results found.  Assessment/Plan Essential hypertension Controlled, continue Labetalol 100mg  bid.  03/18/16 wbc 8.1, Hgb 11.4, Plt 225, na 145, K 4.0, Bun 25, creat 123XX123   Diastolic CHF, acute on chronic (HCC) Clinically compensated. Not on diuretics.   GERD Stable, continue  Famotidine 20mg  daily.    Constipation Stable, continue Senokot S II nightly, MiraLax qod, Colace prn  Hypothyroidism TSH 2.18 02/14/16, continue Levothyroxine 46mcg qd    Dementia of the Alzheimer's type 05/19/14 MMSE 18/30, continue Namenda, SNF for care needs    Anemia Stable, last Hgb 11.4 03/18/16     Family/ staff Communication: SNF  Labs/tests ordered:  none

## 2016-05-21 NOTE — Assessment & Plan Note (Signed)
TSH 2.18 02/14/16, continue Levothyroxine 37mcg qd

## 2016-05-21 NOTE — Assessment & Plan Note (Signed)
05/19/14 MMSE 18/30, continue Namenda, SNF for care needs 

## 2016-05-21 NOTE — Assessment & Plan Note (Signed)
Controlled, continue Labetalol 100mg  bid.  03/18/16 wbc 8.1, Hgb 11.4, Plt 225, na 145, K 4.0, Bun 25, creat 0.81

## 2016-05-21 NOTE — Assessment & Plan Note (Signed)
Clinically compensated. Not on diuretics.  

## 2016-05-21 NOTE — Assessment & Plan Note (Signed)
Stable, last Hgb 11.4 03/18/16

## 2016-05-21 NOTE — Assessment & Plan Note (Signed)
Stable, continue Famotidine 20mg daily.   

## 2016-05-21 NOTE — Assessment & Plan Note (Signed)
Stable, continue Senokot S II nightly, MiraLax qod, Colace prn 

## 2016-06-16 ENCOUNTER — Encounter: Payer: Self-pay | Admitting: Nurse Practitioner

## 2016-06-16 ENCOUNTER — Non-Acute Institutional Stay (SKILLED_NURSING_FACILITY): Payer: Medicare Other | Admitting: Nurse Practitioner

## 2016-06-16 DIAGNOSIS — F0280 Dementia in other diseases classified elsewhere without behavioral disturbance: Secondary | ICD-10-CM

## 2016-06-16 DIAGNOSIS — D649 Anemia, unspecified: Secondary | ICD-10-CM

## 2016-06-16 DIAGNOSIS — I10 Essential (primary) hypertension: Secondary | ICD-10-CM | POA: Diagnosis not present

## 2016-06-16 DIAGNOSIS — E039 Hypothyroidism, unspecified: Secondary | ICD-10-CM | POA: Diagnosis not present

## 2016-06-16 DIAGNOSIS — G308 Other Alzheimer's disease: Secondary | ICD-10-CM | POA: Diagnosis not present

## 2016-06-16 DIAGNOSIS — K5900 Constipation, unspecified: Secondary | ICD-10-CM

## 2016-06-16 DIAGNOSIS — K219 Gastro-esophageal reflux disease without esophagitis: Secondary | ICD-10-CM

## 2016-06-16 NOTE — Assessment & Plan Note (Signed)
05/19/14 MMSE 18/30, continue Namenda, SNF for care needs 

## 2016-06-16 NOTE — Assessment & Plan Note (Signed)
Stable, last Hgb 11.4 03/18/16

## 2016-06-16 NOTE — Assessment & Plan Note (Signed)
Stable, continue Famotidine 20mg daily.   

## 2016-06-16 NOTE — Progress Notes (Signed)
Location:  [facility] Room Number: 18 Place of Service:  SNF (31) Provider:  Ashlynn Gunnels, [place]  NP  Jeanmarie Hubert, MD  Patient Care Team: Estill Dooms, MD as PCP - General (Internal Medicine) Hurman Horn, MD as Consulting Physician (Ophthalmology) Burnell Blanks, MD as Consulting Physician (Cardiology) Inda Castle, MD as Consulting Physician (Gastroenterology) Jari Pigg, MD as Consulting Physician (Dermatology) Dorna Leitz, MD as Consulting Physician (Orthopedic Surgery) Ashten Prats Otho Darner, NP as Nurse Practitioner (Internal Medicine)  Extended Emergency Contact Information Primary Emergency Contact: [facility] Address: [address]          [place], [place] [address] Johnnette Litter of [place] Phone: [phone] Relation: Daughter Secondary Emergency Contact: [place], [place], [place] [address] Johnnette Litter of [place] Phone: [phone] Relation: Son  Code Status:  DNR Goals of care: Advanced Directive information Advanced Directives 06/16/2016  Does Patient Have a Medical Advance Directive? Yes  Type of Advance Directive Out of facility DNR (pink MOST or yellow form)  Does patient want to make changes to medical advance directive? No - Patient declined  Copy of [place] in Chart? Yes  Pre-existing out of facility DNR order (yellow form or pink MOST form) Yellow form placed in chart (order not valid for inpatient use)     Chief Complaint  Patient presents with  . Medical Management of Chronic Issues    HPI:  Pt is a 83 y.o. female seen today for medical management of chronic diseases.      Hx of GERD asymptomatic, takes Famotidine 20mg  daily, last TSH 2.18 02/14/16, on Levothyroxine 104mcg, controlled blood pressure while taking Labetalol 100mg  bid, taking Namenda for memory loss, she is functioning adequately in SNF. Denied pain, constipation, or trouble sleeping at night. Takes Kcl 60meq, last K 4.0  03/18/16             04/07/15-04/09/15 hospitalized, A Chest X-ray revealed a Bilateral Pleuarl Effusions, the left, s/p thorocentesis.             Past Medical History:  Diagnosis Date  . Acute blood loss anemia 03/29/2014   03/30/14 Hgb 7.3 04/04/14 Hgb 8.5 continue Fe bid.  05/04/14 Hgb 10.8   . Benign neoplasm of colon   . Cancer ([place])    pre-melanoma  . Cystocele   . Dementia of the Alzheimer's type 05/22/2014   05/19/14 MMSE 18/30 Namenda.    . Diastolic dysfunction [id number]  . Diverticulosis of colon (without mention of hemorrhage)   . Diverticulosis of large intestine 06/28/2004  . Esophageal reflux   . Essential hypertension 07/05/2013  . Gastroparesis   . Hemorrhoids   . HTN (hypertension)   . Hyperlipidemia 07/05/2013  . Hypertension   . Hypothyroidism   . Internal hemorrhoids without mention of complication [id number]  . Macular degeneration   . Nonspecific abnormal electrocardiogram (ECG) (EKG)   . Osteoarthritis    right hip  . Other and unspecified hyperlipidemia   . Prediabetes 11/17/2013  . Primary osteoarthritis of right hip 03/27/2014  . Shingles   . Skin cancer (melanoma) ([place])   . Stricture and stenosis of esophagus   . Stricture and stenosis of esophagus 10/27/2007  . Vitamin D deficiency 11/17/2013   Past Surgical History:  Procedure Laterality Date  . APPENDECTOMY    . BCC resection     legs/face  . CATARACT EXTRACTION, BILATERAL    . CHOLECYSTECTOMY    .  FLEXIBLE SIGMOIDOSCOPY  05/12/2011   Procedure: FLEXIBLE SIGMOIDOSCOPY;  Surgeon: Inda Castle, MD;  [facility]: [facility];  Service: Endoscopy;  Laterality: N/A;  . FLEXIBLE SIGMOIDOSCOPY  09/10/2011   Procedure: FLEXIBLE SIGMOIDOSCOPY;  Surgeon: Inda Castle, MD;  [facility]: [facility];  Service: Endoscopy;  Laterality: N/A;  . FLEXIBLE SIGMOIDOSCOPY N/A 08/16/2012   Procedure: FLEXIBLE SIGMOIDOSCOPY;  Surgeon: Inda Castle, MD;  [facility];  Service: Endoscopy;  Laterality: N/A;  .  FRACTURE SURGERY     left hip pin  . GALLBLADDER SURGERY  2002  . HEMORRHOID BANDING N/A 08/16/2012   Procedure: HEMORRHOID BANDING;  Surgeon: Inda Castle, MD;  [facility];  Service: Endoscopy;  Laterality: N/A;  . HEMORRHOID SURGERY    . left eye surgery    . melenoma resection     right arm  . THYROIDECTOMY    . TONSILLECTOMY    . TOTAL HIP ARTHROPLASTY Right 03/27/2014   Procedure: TOTAL HIP ARTHROPLASTY ANTERIOR APPROACH;  Surgeon: Alta Corning, MD;  Location: [facility];  Service: Orthopedics;  Laterality: Right;    Allergies  Allergen Reactions  . Ace Inhibitors Other (See Comments)    Elevated potassium  . Statins Other (See Comments)    Myalgias   . Vicodin [Hydrocodone-Acetaminophen] Other (See Comments)    "felt spaced out"  . Fosamax [Alendronate Sodium] Other (See Comments)  . Metoclopramide Hcl Other (See Comments)  . Sulfa Antibiotics Rash  . Sulfonamide Derivatives Rash  . Vesicare [Solifenacin] Other (See Comments)    Allergies as of 06/16/2016      Reactions   Ace Inhibitors Other (See Comments)   Elevated potassium   Statins Other (See Comments)   Myalgias   Vicodin [hydrocodone-acetaminophen] Other (See Comments)   "felt spaced out"   Fosamax [alendronate Sodium] Other (See Comments)   Metoclopramide Hcl Other (See Comments)   Sulfa Antibiotics Rash   Sulfonamide Derivatives Rash   Vesicare [solifenacin] Other (See Comments)      Medication List       Accurate as of 06/16/16  2:06 PM. Always use your most recent med list.          acetaminophen 500 MG tablet Commonly known as:  TYLENOL Take 1,000 mg by mouth 3 (three) times daily as needed for mild pain or moderate pain.   aspirin EC 81 MG tablet Take 81 mg by mouth daily.   CAL-GEST ANTACID 500 MG chewable tablet Generic drug:  calcium carbonate Chew 1 tablet by mouth daily as needed for indigestion or heartburn.   docusate sodium 100 MG capsule Commonly known as:   COLACE Take 100 mg by mouth 2 (two) times daily as needed for mild constipation.   famotidine 20 MG tablet Commonly known as:  PEPCID Take 20 mg by mouth at bedtime.   guaiFENesin 100 MG/5ML Soln Commonly known as:  ROBITUSSIN Take 10 mLs by mouth every 6 (six) hours as needed for cough or to loosen phlegm.   labetalol 100 MG tablet Commonly known as:  NORMODYNE Take 100 mg by mouth 2 (two) times daily.   levothyroxine 75 MCG tablet Commonly known as:  SYNTHROID, LEVOTHROID Take 75 mcg by mouth daily before breakfast.   memantine 28 MG Cp24 24 hr capsule Commonly known as:  NAMENDA XR Take 28 mg by mouth daily.   nystatin-triamcinolone cream Commonly known as:  MYCOLOG II Apply 1 application topically. Apply to affected area once a day   polyethylene glycol packet  Commonly known as:  MIRALAX / GLYCOLAX Take 17 g by mouth every other day.   potassium chloride SA 20 MEQ tablet Commonly known as:  K-DUR,KLOR-CON Take by mouth. Take 2 tablets (40 meq) once a day   PRESERVISION AREDS 2 Caps Take 1 capsule by mouth 2 (two) times daily.   protein supplement Powd Take 1 scoop by mouth. 1 scoop twice a day due to low albumin   sennosides-docusate sodium 8.6-50 MG tablet Commonly known as:  SENOKOT-S Take 2 tablets by mouth at bedtime.   Vitamin D 2000 units tablet Take 4,000 Units by mouth daily.       Review of Systems  Constitutional: Negative for activity change, appetite change, chills, fatigue and fever.  HENT: Positive for hearing loss. Negative for congestion, ear discharge and ear pain.   Eyes: Negative for pain, discharge and redness.  Respiratory: Negative for cough, shortness of breath and wheezing.   Cardiovascular: Negative for chest pain and leg swelling.  Gastrointestinal: Negative for abdominal pain, constipation, nausea and vomiting.  Genitourinary: Positive for frequency. Negative for dysuria and urgency.  Musculoskeletal: Positive for back pain and  gait problem. Negative for myalgias and neck pain.  Skin: Negative for rash.  Neurological: Negative for dizziness, tremors, seizures, weakness and headaches.       Alzheimer's  Hematological:       Hx anemia  Psychiatric/Behavioral: Positive for confusion and decreased concentration. Negative for hallucinations and suicidal ideas. The patient is nervous/anxious.     Immunization History  Administered Date(s) Administered  . Influenza, High Dose Seasonal PF 12/27/2013  . Influenza-Unspecified 01/12/2013, 01/02/2015, 01/29/2016  . Pneumococcal-Unspecified 07/01/2012  . Td 05/27/2004  . Tdap 02/21/2012  . Zoster 04/15/2007   Pertinent  Health Maintenance Due  Topic Date Due  . PNA vac Low Risk Adult (2 of 2 - PCV13) 07/01/2013  . INFLUENZA VACCINE  Completed  . DEXA SCAN  Completed   Fall Risk  05/22/2014 10/06/2013 06/02/2013  Falls in the past year? No No No  Risk for fall due to : - History of fall(s);Impaired balance/gait;Impaired mobility Impaired mobility   Functional Status Survey:    Vitals:   06/16/16 1346  BP: (!) 166/84  Pulse: 75  Resp: 18  Temp: 98.1 F (36.7 C)  Weight: 134 lb 1.6 oz (60.8 kg)  Height: 5\' 1"  (1.549 m)   Body mass index is 25.34 kg/m. Physical Exam  Constitutional: She appears well-developed and well-nourished. No distress.  HENT:  Head: Normocephalic and atraumatic.  Right Ear: External ear normal.  Mouth/Throat: No oropharyngeal exudate.  Eyes: Conjunctivae and EOM are normal. Pupils are equal, round, and reactive to light. Right eye exhibits no discharge. Left eye exhibits no discharge. No scleral icterus.  lower eyelids redness, mild ectropion noted   Neck: Normal range of motion. Neck supple. No JVD present. No tracheal deviation present. No thyromegaly present.  Cardiovascular: Normal rate, regular rhythm, normal heart sounds and intact distal pulses.   No murmur heard. Pulmonary/Chest: Effort normal. No respiratory distress. She  has no wheezes. She has rales. She exhibits no tenderness.  Abdominal: Soft. Bowel sounds are normal. She exhibits no distension and no mass. There is no tenderness.  Musculoskeletal: Normal range of motion. She exhibits no edema or tenderness.  Right hip pain with ROM.  Neurological: She is alert. She has normal reflexes. No cranial nerve deficit. She exhibits normal muscle tone. Coordination normal.  dementia  Skin: Skin is dry. No rash noted. She  is not diaphoretic. No pallor.  Right hip surgical incision healed.   Psychiatric: She has a normal mood and affect. Her behavior is normal. Thought content normal. Her speech is not rapid and/or pressured, not delayed, not tangential and not slurred. Cognition and memory are impaired. She is communicative. She exhibits abnormal recent memory. She exhibits normal remote memory.    Labs reviewed:  Recent Labs  01/24/16 01/30/16 03/18/16  NA 140 143 145  K 3.1* 4.3 4.0  BUN 17 20 25*  CREATININE 0.9 0.8 0.8    Recent Labs  07/10/15 11/27/15 01/22/16  AST 12* 12* 18  ALT 7 7 15   ALKPHOS 55 57 121    Recent Labs  11/27/15 01/22/16 03/18/16  WBC 7.5 13.8 8.1  HGB 12.4 11.8* 11.4*  HCT 37 35* 35*  PLT 255 185 225   Lab Results  Component Value Date   TSH 2.18 02/14/2016   Lab Results  Component Value Date   HGBA1C 5.8 (H) 07/06/2014   Lab Results  Component Value Date   CHOL 209 (H) 07/06/2014   HDL 66 07/06/2014   LDLCALC 111 (H) 07/06/2014   TRIG 160 (H) 07/06/2014   CHOLHDL 3.2 07/06/2014    Significant Diagnostic Results in last 30 days:  No results found.  Assessment/Plan Constipation Stable, continue Senokot S II nightly, MiraLax qod, Colace prn  GERD Stable, continue Famotidine 20mg  daily.  Essential hypertension Controlled, continue Labetalol 100mg  bid.  03/18/16 wbc 8.1, Hgb 11.4, Plt 225, na 145, K 4.0, Bun 25, creat 0.81    Hypothyroidism TSH 2.18 02/14/16, continue Levothyroxine 61mcg qd  Dementia  of the Alzheimer's type 05/19/14 MMSE 18/30, continue Namenda, SNF for care needs  Anemia Stable, last Hgb 11.4 03/18/16      Family/ staff Communication: SNF  Labs/tests ordered: none

## 2016-06-16 NOTE — Assessment & Plan Note (Signed)
Stable, continue Senokot S II nightly, MiraLax qod, Colace prn 

## 2016-06-16 NOTE — Assessment & Plan Note (Signed)
Controlled, continue Labetalol 100mg  bid.  03/18/16 wbc 8.1, Hgb 11.4, Plt 225, na 145, K 4.0, Bun 25, creat 0.81

## 2016-06-16 NOTE — Assessment & Plan Note (Signed)
TSH 2.18 02/14/16, continue Levothyroxine 29mcg qd

## 2016-06-30 DIAGNOSIS — H353134 Nonexudative age-related macular degeneration, bilateral, advanced atrophic with subfoveal involvement: Secondary | ICD-10-CM | POA: Diagnosis not present

## 2016-06-30 DIAGNOSIS — H353221 Exudative age-related macular degeneration, left eye, with active choroidal neovascularization: Secondary | ICD-10-CM | POA: Diagnosis not present

## 2016-07-14 ENCOUNTER — Encounter: Payer: Self-pay | Admitting: Internal Medicine

## 2016-07-14 ENCOUNTER — Non-Acute Institutional Stay (SKILLED_NURSING_FACILITY): Payer: Medicare Other | Admitting: Internal Medicine

## 2016-07-14 DIAGNOSIS — I10 Essential (primary) hypertension: Secondary | ICD-10-CM

## 2016-07-14 DIAGNOSIS — D649 Anemia, unspecified: Secondary | ICD-10-CM

## 2016-07-14 DIAGNOSIS — F0280 Dementia in other diseases classified elsewhere without behavioral disturbance: Secondary | ICD-10-CM

## 2016-07-14 DIAGNOSIS — E039 Hypothyroidism, unspecified: Secondary | ICD-10-CM

## 2016-07-14 DIAGNOSIS — E782 Mixed hyperlipidemia: Secondary | ICD-10-CM

## 2016-07-14 DIAGNOSIS — G308 Other Alzheimer's disease: Secondary | ICD-10-CM

## 2016-07-14 NOTE — Progress Notes (Signed)
Progress Note    Location:  [facility] Room Number: N57 Place of Service:  SNF (31) Provider:Arthur Nyoka Cowden, MD  Patient Care Team: Estill Dooms, MD as PCP - General (Internal Medicine) Hurman Horn, MD as Consulting Physician (Ophthalmology) Burnell Blanks, MD as Consulting Physician (Cardiology) Inda Castle, MD as Consulting Physician (Gastroenterology) Jari Pigg, MD as Consulting Physician (Dermatology) Dorna Leitz, MD as Consulting Physician (Orthopedic Surgery) Man Otho Darner, NP as Nurse Practitioner (Internal Medicine)  Extended Emergency Contact Information Primary Emergency Contact: [facility] Address: [address]          [place], [place] [address] Johnnette Litter of [place] Phone: [phone] Relation: Daughter Secondary Emergency Contact: [place], [place], [place] [address] Johnnette Litter of [place] Phone: [phone] Relation: Son  Code Status:  DNR Goals of care: Advanced Directive information Advanced Directives 06/16/2016  Does Patient Have a Medical Advance Directive? Yes  Type of Advance Directive Out of facility DNR (pink MOST or yellow form)  Does patient want to make changes to medical advance directive? No - Patient declined  Copy of [place] in Chart? Yes  Pre-existing out of facility DNR order (yellow form or pink MOST form) Yellow form placed in chart (order not valid for inpatient use)     Chief Complaint  Patient presents with  . Medical Management of Chronic Issues    routine visit    HPI:  Pt is a 83 y.o. female seen today for medical management of chronic diseases.    Alzheimer's disease of other onset without behavioral disturbance - unchanged  Essential hypertension - controlled  Anemia, unspecified type - stable  Hypothyroidism, unspecified type - compensated  Hyperlipidemia - no recent lab     Past Medical History:  Diagnosis Date  . Acute blood  loss anemia 03/29/2014   03/30/14 Hgb 7.3 04/04/14 Hgb 8.5 continue Fe bid.  05/04/14 Hgb 10.8   . Benign neoplasm of colon   . Cancer ([place])    pre-melanoma  . Cystocele   . Dementia of the Alzheimer's type 05/22/2014   05/19/14 MMSE 18/30 Namenda.    . Diastolic dysfunction 0/94/7096  . Diverticulosis of colon (without mention of hemorrhage)   . Diverticulosis of large intestine 06/28/2004  . Esophageal reflux   . Essential hypertension 07/05/2013  . Gastroparesis   . Hemorrhoids   . HTN (hypertension)   . Hyperlipidemia 07/05/2013  . Hypertension   . Hypothyroidism   . Internal hemorrhoids without mention of complication 28/06/6627  . Macular degeneration   . Nonspecific abnormal electrocardiogram (ECG) (EKG)   . Osteoarthritis    right hip  . Other and unspecified hyperlipidemia   . Prediabetes 11/17/2013  . Primary osteoarthritis of right hip 03/27/2014  . Shingles   . Skin cancer (melanoma) ([place])   . Stricture and stenosis of esophagus   . Stricture and stenosis of esophagus 10/27/2007  . Vitamin D deficiency 11/17/2013   Past Surgical History:  Procedure Laterality Date  . APPENDECTOMY    . BCC resection     legs/face  . CATARACT EXTRACTION, BILATERAL    . CHOLECYSTECTOMY    . FLEXIBLE SIGMOIDOSCOPY  05/12/2011   Procedure: FLEXIBLE SIGMOIDOSCOPY;  Surgeon: Inda Castle, MD;  [facility];  Service: Endoscopy;  Laterality: N/A;  . FLEXIBLE SIGMOIDOSCOPY  09/10/2011   Procedure: FLEXIBLE SIGMOIDOSCOPY;  Surgeon: Inda Castle, MD;  Location: [facility];  Service: Endoscopy;  Laterality: N/A;  . FLEXIBLE SIGMOIDOSCOPY N/A 08/16/2012   Procedure: FLEXIBLE SIGMOIDOSCOPY;  Surgeon: Inda Castle, MD;  [facility];  Service: Endoscopy;  Laterality: N/A;  . FRACTURE SURGERY     left hip pin  . GALLBLADDER SURGERY  2002  . HEMORRHOID BANDING N/A 08/16/2012   Procedure: HEMORRHOID BANDING;  Surgeon: Inda Castle, MD;  [facility];  Service:  Endoscopy;  Laterality: N/A;  . HEMORRHOID SURGERY    . left eye surgery    . melenoma resection     right arm  . THYROIDECTOMY    . TONSILLECTOMY    . TOTAL HIP ARTHROPLASTY Right 03/27/2014   Procedure: TOTAL HIP ARTHROPLASTY ANTERIOR APPROACH;  Surgeon: Alta Corning, MD;  Location: [facility];  Service: Orthopedics;  Laterality: Right;    Allergies  Allergen Reactions  . Ace Inhibitors Other (See Comments)    Elevated potassium  . Statins Other (See Comments)    Myalgias   . Vicodin [Hydrocodone-Acetaminophen] Other (See Comments)    "felt spaced out"  . Fosamax [Alendronate Sodium] Other (See Comments)  . Metoclopramide Hcl Other (See Comments)  . Sulfa Antibiotics Rash  . Sulfonamide Derivatives Rash  . Vesicare [Solifenacin] Other (See Comments)    Allergies as of 07/14/2016      Reactions   Ace Inhibitors Other (See Comments)   Elevated potassium   Statins Other (See Comments)   Myalgias   Vicodin [hydrocodone-acetaminophen] Other (See Comments)   "felt spaced out"   Fosamax [alendronate Sodium] Other (See Comments)   Metoclopramide Hcl Other (See Comments)   Sulfa Antibiotics Rash   Sulfonamide Derivatives Rash   Vesicare [solifenacin] Other (See Comments)      Medication List       Accurate as of 07/14/16  2:02 PM. Always use your most recent med list.          acetaminophen 500 MG tablet Commonly known as:  TYLENOL Take 1,000 mg by mouth 3 (three) times daily as needed for mild pain or moderate pain.   aspirin EC 81 MG tablet Take 81 mg by mouth daily.   CAL-GEST ANTACID 500 MG chewable tablet Generic drug:  calcium carbonate Chew 1 tablet by mouth daily as needed for indigestion or heartburn.   docusate sodium 100 MG capsule Commonly known as:  COLACE Take 100 mg by mouth 2 (two) times daily as needed for mild constipation.   famotidine 20 MG tablet Commonly known as:  PEPCID Take 20 mg by mouth at bedtime.   guaiFENesin 100 MG/5ML  Soln Commonly known as:  ROBITUSSIN Take 10 mLs by mouth every 6 (six) hours as needed for cough or to loosen phlegm.   labetalol 100 MG tablet Commonly known as:  NORMODYNE Take 100 mg by mouth 2 (two) times daily.   levothyroxine 75 MCG tablet Commonly known as:  SYNTHROID, LEVOTHROID Take 75 mcg by mouth daily before breakfast.   memantine 28 MG Cp24 24 hr capsule Commonly known as:  NAMENDA XR Take 28 mg by mouth daily.   nystatin-triamcinolone cream Commonly known as:  MYCOLOG II Apply 1 application topically. Apply to affected area once a day   polyethylene glycol packet Commonly known as:  MIRALAX / GLYCOLAX Take 17 g by mouth every other day.   potassium chloride SA 20 MEQ tablet Commonly known as:  K-DUR,KLOR-CON Take by mouth. Take 2 tablets (40 meq) once a day   PRESERVISION AREDS 2 Caps  Take 1 capsule by mouth 2 (two) times daily.   protein supplement Powd Take 1 scoop by mouth. 1 scoop twice a day due to low albumin   sennosides-docusate sodium 8.6-50 MG tablet Commonly known as:  SENOKOT-S Take 2 tablets by mouth at bedtime.   Vitamin D 2000 units tablet Take 4,000 Units by mouth daily.       Review of Systems  Constitutional: Negative for activity change, appetite change, chills, fatigue and fever.  HENT: Positive for hearing loss. Negative for congestion, ear discharge and ear pain.   Eyes: Negative for pain, discharge and redness.  Respiratory: Negative for cough, shortness of breath and wheezing.   Cardiovascular: Negative for chest pain and leg swelling.  Gastrointestinal: Negative for abdominal pain, constipation, nausea and vomiting.  Genitourinary: Positive for frequency. Negative for dysuria and urgency.  Musculoskeletal: Positive for back pain and gait problem. Negative for myalgias and neck pain.  Skin: Negative for rash.  Neurological: Negative for dizziness, tremors, seizures, weakness and headaches.       Alzheimer's  Hematological:        Hx anemia  Psychiatric/Behavioral: Positive for confusion and decreased concentration. Negative for hallucinations and suicidal ideas. The patient is nervous/anxious.     Immunization History  Administered Date(s) Administered  . Influenza, High Dose Seasonal PF 12/27/2013  . Influenza-Unspecified 01/12/2013, 01/02/2015, 01/29/2016  . Pneumococcal-Unspecified 07/01/2012  . Td 05/27/2004  . Tdap 02/21/2012  . Zoster 04/15/2007   Pertinent  Health Maintenance Due  Topic Date Due  . PNA vac Low Risk Adult (2 of 2 - PCV13) 07/01/2013  . INFLUENZA VACCINE  11/12/2016  . DEXA SCAN  Completed   Fall Risk  05/22/2014 10/06/2013 06/02/2013  Falls in the past year? No No No  Risk for fall due to : - History of fall(s);Impaired balance/gait;Impaired mobility Impaired mobility     Vitals:   07/14/16 1356  BP: 138/68  Pulse: 72  Resp: 18  Temp: 98.1 F (36.7 C)  SpO2: 95%  Weight: 134 lb 9.6 oz (61.1 kg)  Height: 5\' 1"  (1.549 m)   Body mass index is 25.43 kg/m. Physical Exam  Constitutional: She appears well-developed and well-nourished. No distress.  HENT:  Head: Normocephalic and atraumatic.  Right Ear: External ear normal.  Mouth/Throat: No oropharyngeal exudate.  Eyes: Conjunctivae and EOM are normal. Pupils are equal, round, and reactive to light. Right eye exhibits no discharge. Left eye exhibits no discharge. No scleral icterus.  lower eyelids redness, mild ectropion noted  Neck: Normal range of motion. Neck supple. No JVD present. No tracheal deviation present. No thyromegaly present.  Cardiovascular: Normal rate, regular rhythm, normal heart sounds and intact distal pulses.   No murmur heard. Pulmonary/Chest: Effort normal. No respiratory distress. She has no wheezes. She has rales. She exhibits no tenderness.  Abdominal: Soft. Bowel sounds are normal. She exhibits no distension and no mass. There is no tenderness.  Musculoskeletal: Normal range of motion. She  exhibits no edema or tenderness.  Right hip pain with ROM.  Neurological: She is alert. She has normal reflexes. No cranial nerve deficit. She exhibits normal muscle tone. Coordination normal.  dementia  Skin: Skin is dry. No rash noted. She is not diaphoretic. No pallor.  Right hip surgical incision healed.   Psychiatric: She has a normal mood and affect. Her behavior is normal. Thought content normal. Her speech is not rapid and/or pressured, not delayed, not tangential and not slurred. Cognition and memory are impaired. She  is communicative. She exhibits abnormal recent memory. She exhibits normal remote memory.    Labs reviewed:  Recent Labs  01/24/16 01/30/16 03/18/16  NA 140 143 145  K 3.1* 4.3 4.0  BUN 17 20 25*  CREATININE 0.9 0.8 0.8    Recent Labs  11/27/15 01/22/16  AST 12* 18  ALT 7 15  ALKPHOS 57 121    Recent Labs  11/27/15 01/22/16 03/18/16  WBC 7.5 13.8 8.1  HGB 12.4 11.8* 11.4*  HCT 37 35* 35*  PLT 255 185 225   Lab Results  Component Value Date   TSH 2.18 02/14/2016   Lab Results  Component Value Date   HGBA1C 5.8 (H) 07/06/2014   Lab Results  Component Value Date   CHOL 209 (H) 07/06/2014   HDL 66 07/06/2014   LDLCALC 111 (H) 07/06/2014   TRIG 160 (H) 07/06/2014   CHOLHDL 3.2 07/06/2014     Assessment/Plan 1. Alzheimer's disease of other onset without behavioral disturbance stable  2. Essential hypertension The current medical regimen is effective;  continue present plan and medications. -BMP  3. Anemia, unspecified type -CBC, Reticulocyte count  4. Hypothyroidism, unspecified type The current medical regimen is effective;  continue present plan and medications. -TSH  5. Hyperlipidemia -lipids

## 2016-07-15 DIAGNOSIS — I10 Essential (primary) hypertension: Secondary | ICD-10-CM | POA: Diagnosis not present

## 2016-07-15 DIAGNOSIS — E782 Mixed hyperlipidemia: Secondary | ICD-10-CM | POA: Diagnosis not present

## 2016-07-15 DIAGNOSIS — D649 Anemia, unspecified: Secondary | ICD-10-CM | POA: Diagnosis not present

## 2016-07-15 DIAGNOSIS — E039 Hypothyroidism, unspecified: Secondary | ICD-10-CM | POA: Diagnosis not present

## 2016-07-15 LAB — BASIC METABOLIC PANEL
BUN: 17 mg/dL (ref 4–21)
Creatinine: 0.8 mg/dL (ref <=1.1)
GLUCOSE: 76 mg/dL
POTASSIUM: 4.5 mmol/L (ref 3.4–5.3)
SODIUM: 143 mmol/L (ref 137–147)

## 2016-07-15 LAB — CBC AND DIFFERENTIAL
HCT: 36 % (ref 36–46)
HEMOGLOBIN: 12.1 g/dL (ref 12.0–16.0)
Platelets: 265 10*3/uL (ref 150–399)
WBC: 7.4 10^3/mL

## 2016-07-15 LAB — LIPID PANEL
CHOLESTEROL: 228 mg/dL — AB (ref 0–200)
HDL: 55 mg/dL (ref 35–70)
LDL Cholesterol: 146 mg/dL
TRIGLYCERIDES: 141 mg/dL (ref 40–160)

## 2016-07-15 LAB — TSH: TSH: 1.74 u[IU]/mL (ref <=5.90)

## 2016-07-16 ENCOUNTER — Other Ambulatory Visit: Payer: Self-pay | Admitting: *Deleted

## 2016-07-16 LAB — LIPID PANEL: LDl/HDL Ratio: 4.1

## 2016-08-13 ENCOUNTER — Encounter: Payer: Self-pay | Admitting: Nurse Practitioner

## 2016-08-13 ENCOUNTER — Non-Acute Institutional Stay (SKILLED_NURSING_FACILITY): Payer: Medicare Other | Admitting: Nurse Practitioner

## 2016-08-13 DIAGNOSIS — F0280 Dementia in other diseases classified elsewhere without behavioral disturbance: Secondary | ICD-10-CM

## 2016-08-13 DIAGNOSIS — K219 Gastro-esophageal reflux disease without esophagitis: Secondary | ICD-10-CM | POA: Diagnosis not present

## 2016-08-13 DIAGNOSIS — E039 Hypothyroidism, unspecified: Secondary | ICD-10-CM

## 2016-08-13 DIAGNOSIS — D649 Anemia, unspecified: Secondary | ICD-10-CM | POA: Diagnosis not present

## 2016-08-13 DIAGNOSIS — I5032 Chronic diastolic (congestive) heart failure: Secondary | ICD-10-CM | POA: Diagnosis not present

## 2016-08-13 DIAGNOSIS — G308 Other Alzheimer's disease: Secondary | ICD-10-CM | POA: Diagnosis not present

## 2016-08-13 DIAGNOSIS — K5900 Constipation, unspecified: Secondary | ICD-10-CM | POA: Diagnosis not present

## 2016-08-13 DIAGNOSIS — E876 Hypokalemia: Secondary | ICD-10-CM

## 2016-08-13 DIAGNOSIS — I10 Essential (primary) hypertension: Secondary | ICD-10-CM | POA: Diagnosis not present

## 2016-08-13 NOTE — Assessment & Plan Note (Signed)
Clinically compensated. Not on diuretics.

## 2016-08-13 NOTE — Assessment & Plan Note (Signed)
07/15/16 Hgb 12.1

## 2016-08-13 NOTE — Assessment & Plan Note (Signed)
TSH 1.74 07/15/16, continue Levothyroxine 81mcg qd

## 2016-08-13 NOTE — Assessment & Plan Note (Signed)
05/19/14 MMSE 18/30, continue Namenda, SNF for care needs

## 2016-08-13 NOTE — Assessment & Plan Note (Signed)
Stable, continue Famotidine 20mg daily.   

## 2016-08-13 NOTE — Progress Notes (Signed)
Location:  [facility] Room Number: 77 Place of Service:  SNF (31) Provider:  Josedejesus Marcum, Manxie  NP  Jeanmarie Hubert, MD  Patient Care Team: Estill Dooms, MD as PCP - General (Internal Medicine) Hurman Horn, MD as Consulting Physician (Ophthalmology) Burnell Blanks, MD as Consulting Physician (Cardiology) Inda Castle, MD as Consulting Physician (Gastroenterology) Jari Pigg, MD as Consulting Physician (Dermatology) Dorna Leitz, MD as Consulting Physician (Orthopedic Surgery) Audry Pecina Otho Darner, NP as Nurse Practitioner (Internal Medicine)  Extended Emergency Contact Information Primary Emergency Contact: [facility] Address: [address]          [place], [place] [address] Johnnette Litter of [place] Phone: [phone] Relation: Daughter Secondary Emergency Contact: [place], [place], [place] [address] Johnnette Litter of [place] Phone: [phone] Relation: Son  Code Status:  DNR Goals of care: Advanced Directive information Advanced Directives 08/13/2016  Does Patient Have a Medical Advance Directive? Yes  Type of Advance Directive Out of facility DNR (pink MOST or yellow form)  Does patient want to make changes to medical advance directive? No - Patient declined  Copy of [place] in Chart? Yes  Pre-existing out of facility DNR order (yellow form or pink MOST form) Yellow form placed in chart (order not valid for inpatient use)     Chief Complaint  Patient presents with  . Medical Management of Chronic Issues    HPI:  Pt is a 83 y.o. female seen today for medical management of chronic diseases.     Hx of GERD asymptomatic, takes Famotidine 20mg  daily, last TSH 1.74 07/15/16, on Levothyroxine 52mcg, controlled blood pressure while taking Labetalol 100mg  bid, taking Namenda for memory loss, she is functioning adequately in SNF. Denied pain, constipation, or trouble sleeping at night. Takes Kcl 23meq, last K 4.5  07/15/16 04/07/15-04/09/15 hospitalized, A Chest X-ray revealed a Bilateral Pleuarl Effusions, the left, s/p thorocentesis.    Past Medical History:  Diagnosis Date  . Acute blood loss anemia 03/29/2014   03/30/14 Hgb 7.3 04/04/14 Hgb 8.5 continue Fe bid.  05/04/14 Hgb 10.8   . Benign neoplasm of colon   . Cancer ([place])    pre-melanoma  . Cystocele   . Dementia of the Alzheimer's type 05/22/2014   05/19/14 MMSE 18/30 Namenda.    . Diastolic dysfunction 08/29/6158  . Diverticulosis of colon (without mention of hemorrhage)   . Diverticulosis of large intestine 06/28/2004  . Esophageal reflux   . Essential hypertension 07/05/2013  . Gastroparesis   . Hemorrhoids   . HTN (hypertension)   . Hyperlipidemia 07/05/2013  . Hypertension   . Hypothyroidism   . Internal hemorrhoids without mention of complication 73/10/1060  . Macular degeneration   . Nonspecific abnormal electrocardiogram (ECG) (EKG)   . Osteoarthritis    right hip  . Other and unspecified hyperlipidemia   . Prediabetes 11/17/2013  . Primary osteoarthritis of right hip 03/27/2014  . Shingles   . Skin cancer (melanoma) ([place])   . Stricture and stenosis of esophagus   . Stricture and stenosis of esophagus 10/27/2007  . Vitamin D deficiency 11/17/2013   Past Surgical History:  Procedure Laterality Date  . APPENDECTOMY    . BCC resection     legs/face  . CATARACT EXTRACTION, BILATERAL    . CHOLECYSTECTOMY    . FLEXIBLE SIGMOIDOSCOPY  05/12/2011   Procedure: FLEXIBLE SIGMOIDOSCOPY;  Surgeon: Inda Castle, MD;  Location: [facility];  Service: Endoscopy;  Laterality: N/A;  . FLEXIBLE SIGMOIDOSCOPY  09/10/2011   Procedure: FLEXIBLE SIGMOIDOSCOPY;  Surgeon: Inda Castle, MD;  Location: [facility];  Service: Endoscopy;  Laterality: N/A;  . FLEXIBLE SIGMOIDOSCOPY N/A 08/16/2012   Procedure: FLEXIBLE SIGMOIDOSCOPY;  Surgeon: Inda Castle, MD;  [facility]: [facility];  Service: Endoscopy;  Laterality: N/A;  .  FRACTURE SURGERY     left hip pin  . GALLBLADDER SURGERY  2002  . HEMORRHOID BANDING N/A 08/16/2012   Procedure: HEMORRHOID BANDING;  Surgeon: Inda Castle, MD;  Location: [facility];  Service: Endoscopy;  Laterality: N/A;  . HEMORRHOID SURGERY    . left eye surgery    . melenoma resection     right arm  . THYROIDECTOMY    . TONSILLECTOMY    . TOTAL HIP ARTHROPLASTY Right 03/27/2014   Procedure: TOTAL HIP ARTHROPLASTY ANTERIOR APPROACH;  Surgeon: Alta Corning, MD;  Location: [facility];  Service: Orthopedics;  Laterality: Right;    Allergies  Allergen Reactions  . Ace Inhibitors Other (See Comments)    Elevated potassium  . Statins Other (See Comments)    Myalgias   . Vicodin [Hydrocodone-Acetaminophen] Other (See Comments)    "felt spaced out"  . Fosamax [Alendronate Sodium] Other (See Comments)  . Metoclopramide Hcl Other (See Comments)  . Sulfa Antibiotics Rash  . Sulfonamide Derivatives Rash  . Vesicare [Solifenacin] Other (See Comments)    Outpatient Encounter Prescriptions as of 08/13/2016  Medication Sig  . acetaminophen (TYLENOL) 500 MG tablet Take 1,000 mg by mouth 3 (three) times daily as needed for mild pain or moderate pain.  Marland Kitchen aspirin EC 81 MG tablet Take 81 mg by mouth daily.  . calcium carbonate (CAL-GEST ANTACID) 500 MG chewable tablet Chew 1 tablet by mouth daily as needed for indigestion or heartburn.  . Cholecalciferol (VITAMIN D) 2000 UNITS tablet Take 4,000 Units by mouth daily.  Marland Kitchen docusate sodium (COLACE) 100 MG capsule Take 100 mg by mouth 2 (two) times daily as needed for mild constipation.   . famotidine (PEPCID) 20 MG tablet Take 20 mg by mouth at bedtime.  Marland Kitchen guaiFENesin (ROBITUSSIN) 100 MG/5ML SOLN Take 10 mLs by mouth every 6 (six) hours as needed for cough or to loosen phlegm.  . labetalol (NORMODYNE) 100 MG tablet Take 100 mg by mouth 2 (two) times daily.  Marland Kitchen levothyroxine (SYNTHROID, LEVOTHROID) 75 MCG tablet Take 75 mcg by mouth daily before  breakfast.  . memantine (NAMENDA XR) 28 MG CP24 24 hr capsule Take 28 mg by mouth daily.  . Multiple Vitamins-Minerals (PRESERVISION AREDS 2) CAPS Take 1 capsule by mouth 2 (two) times daily.  Marland Kitchen nystatin-triamcinolone (MYCOLOG II) cream Apply 1 application topically. Apply to affected area once a day  . polyethylene glycol (MIRALAX / GLYCOLAX) packet Take 17 g by mouth every other day.   . potassium chloride SA (K-DUR,KLOR-CON) 20 MEQ tablet Take by mouth. Take 2 tablets (40 meq) once a day  . protein supplement (RESOURCE BENEPROTEIN) POWD Take 1 scoop by mouth. 1 scoop twice a day due to low albumin  . sennosides-docusate sodium (SENOKOT-S) 8.6-50 MG tablet Take 2 tablets by mouth at bedtime.    No facility-administered encounter medications on file as of 08/13/2016.     Review of Systems  Constitutional: Negative for activity change, appetite change, chills, fatigue and fever.  HENT: Positive for hearing loss. Negative for congestion, ear discharge and ear pain.   Eyes: Negative for pain, discharge and redness.  Respiratory: Negative for cough, shortness of breath and wheezing.   Cardiovascular: Negative for chest pain and leg swelling.  Gastrointestinal: Negative for abdominal pain, constipation, nausea and vomiting.  Genitourinary: Positive for frequency. Negative for dysuria and urgency.  Musculoskeletal: Positive for back pain and gait problem. Negative for myalgias and neck pain.  Skin: Negative for rash.  Neurological: Negative for dizziness, tremors, seizures, weakness and headaches.       Alzheimer's  Hematological:       Hx anemia  Psychiatric/Behavioral: Positive for confusion and decreased concentration. Negative for hallucinations and suicidal ideas. The patient is nervous/anxious.     Immunization History  Administered Date(s) Administered  . Influenza, High Dose Seasonal PF 12/27/2013  . Influenza-Unspecified 01/12/2013, 01/02/2015, 01/29/2016  .  Pneumococcal-Unspecified 07/01/2012  . Td 05/27/2004  . Tdap 02/21/2012  . Zoster 04/15/2007   Pertinent  Health Maintenance Due  Topic Date Due  . PNA vac Low Risk Adult (2 of 2 - PCV13) 07/01/2013  . INFLUENZA VACCINE  11/12/2016  . DEXA SCAN  Completed   Fall Risk  05/22/2014 10/06/2013 06/02/2013  Falls in the past year? No No No  Risk for fall due to : - History of fall(s);Impaired balance/gait;Impaired mobility Impaired mobility   Functional Status Survey:    Vitals:   08/13/16 1103  BP: 130/84  Pulse: 62  Resp: 18  Temp: 98 F (36.7 C)  Weight: 134 lb 3.2 oz (60.9 kg)  Height: 5\' 1"  (1.549 m)   Body mass index is 25.36 kg/m. Physical Exam  Constitutional: She appears well-developed and well-nourished. No distress.  HENT:  Head: Normocephalic and atraumatic.  Right Ear: External ear normal.  Mouth/Throat: No oropharyngeal exudate.  Eyes: Conjunctivae and EOM are normal. Pupils are equal, round, and reactive to light. Right eye exhibits no discharge. Left eye exhibits no discharge. No scleral icterus.  lower eyelids redness, mild ectropion noted  Neck: Normal range of motion. Neck supple. No JVD present. No tracheal deviation present. No thyromegaly present.  Cardiovascular: Normal rate, regular rhythm, normal heart sounds and intact distal pulses.   No murmur heard. Pulmonary/Chest: Effort normal. No respiratory distress. She has no wheezes. She has rales. She exhibits no tenderness.  Abdominal: Soft. Bowel sounds are normal. She exhibits no distension and no mass. There is no tenderness.  Musculoskeletal: Normal range of motion. She exhibits no edema or tenderness.  Right hip pain with ROM.  Neurological: She is alert. She has normal reflexes. No cranial nerve deficit. She exhibits normal muscle tone. Coordination normal.  dementia  Skin: Skin is dry. No rash noted. She is not diaphoretic. No pallor.  Right hip surgical incision healed.   Psychiatric: She has a  normal mood and affect. Her behavior is normal. Thought content normal. Her speech is not rapid and/or pressured, not delayed, not tangential and not slurred. Cognition and memory are impaired. She is communicative. She exhibits abnormal recent memory. She exhibits normal remote memory.    Labs reviewed:  Recent Labs  01/30/16 03/18/16 07/15/16  NA 143 145 143  K 4.3 4.0 4.5  BUN 20 25* 17  CREATININE 0.8 0.8 0.8    Recent Labs  11/27/15 01/22/16  AST 12* 18  ALT 7 15  ALKPHOS 57 121    Recent Labs  01/22/16 03/18/16 07/15/16  WBC 13.8 8.1 7.4  HGB 11.8* 11.4* 12.1  HCT 35* 35* 36  PLT 185 225 265   Lab Results  Component Value Date   TSH  1.74 07/15/2016   Lab Results  Component Value Date   HGBA1C 5.8 (H) 07/06/2014   Lab Results  Component Value Date   CHOL 228 (A) 07/15/2016   HDL 55 07/15/2016   LDLCALC 146 07/15/2016   TRIG 141 07/15/2016   CHOLHDL 3.2 07/06/2014    Significant Diagnostic Results in last 30 days:  No results found.  Assessment/Plan Hypokalemia 07/15/16 K 4.5 08/13/16 dc Kcl, repeat BMP 1 week.  Essential hypertension Controlled, continue Labetalol 100mg  bid. 07/15/16 LDL 146, Na 143, K 4.5, Bun 17, creat 0.77, wbc 7.4, Hgb 12.1, plt 265, TSH 1.74    Chronic diastolic heart failure (HCC) Clinically compensated. Not on diuretics.   GERD Stable, continue Famotidine 20mg  daily.   Constipation Stable, continue Senokot S II nightly, MiraLax qod, Colace prn  Hypothyroidism TSH 1.74 07/15/16, continue Levothyroxine 77mcg qd  Dementia of the Alzheimer's type 05/19/14 MMSE 18/30, continue Namenda, SNF for care needs  Anemia 07/15/16 Hgb 12.1     Family/ staff Communication:SNF  Labs/tests ordered:  BMP one week.

## 2016-08-13 NOTE — Assessment & Plan Note (Signed)
Stable, continue Senokot S II nightly, MiraLax qod, Colace prn

## 2016-08-13 NOTE — Assessment & Plan Note (Signed)
07/15/16 K 4.5 08/13/16 dc Kcl, repeat BMP 1 week.

## 2016-08-13 NOTE — Assessment & Plan Note (Addendum)
Controlled, continue Labetalol 100mg  bid. 07/15/16 LDL 146, Na 143, K 4.5, Bun 17, creat 0.77, wbc 7.4, Hgb 12.1, plt 265, TSH 1.74

## 2016-08-19 DIAGNOSIS — E782 Mixed hyperlipidemia: Secondary | ICD-10-CM | POA: Diagnosis not present

## 2016-08-19 LAB — BASIC METABOLIC PANEL
BUN: 22 mg/dL — AB (ref 4–21)
Creatinine: 0.8 mg/dL (ref <=1.1)
Glucose: 77 mg/dL
Potassium: 3.5 mmol/L (ref 3.4–5.3)
Sodium: 141 mmol/L (ref 137–147)

## 2016-08-20 ENCOUNTER — Other Ambulatory Visit: Payer: Self-pay | Admitting: *Deleted

## 2016-08-28 DIAGNOSIS — H35352 Cystoid macular degeneration, left eye: Secondary | ICD-10-CM | POA: Diagnosis not present

## 2016-08-28 DIAGNOSIS — H353134 Nonexudative age-related macular degeneration, bilateral, advanced atrophic with subfoveal involvement: Secondary | ICD-10-CM | POA: Diagnosis not present

## 2016-08-28 DIAGNOSIS — H353221 Exudative age-related macular degeneration, left eye, with active choroidal neovascularization: Secondary | ICD-10-CM | POA: Diagnosis not present

## 2016-08-28 DIAGNOSIS — H35052 Retinal neovascularization, unspecified, left eye: Secondary | ICD-10-CM | POA: Diagnosis not present

## 2016-09-15 ENCOUNTER — Encounter: Payer: Self-pay | Admitting: Nurse Practitioner

## 2016-09-15 ENCOUNTER — Non-Acute Institutional Stay (SKILLED_NURSING_FACILITY): Payer: Medicare Other | Admitting: Nurse Practitioner

## 2016-09-15 DIAGNOSIS — E876 Hypokalemia: Secondary | ICD-10-CM

## 2016-09-15 DIAGNOSIS — I10 Essential (primary) hypertension: Secondary | ICD-10-CM | POA: Diagnosis not present

## 2016-09-15 DIAGNOSIS — I5032 Chronic diastolic (congestive) heart failure: Secondary | ICD-10-CM | POA: Diagnosis not present

## 2016-09-15 DIAGNOSIS — G308 Other Alzheimer's disease: Secondary | ICD-10-CM

## 2016-09-15 DIAGNOSIS — D649 Anemia, unspecified: Secondary | ICD-10-CM | POA: Diagnosis not present

## 2016-09-15 DIAGNOSIS — K5900 Constipation, unspecified: Secondary | ICD-10-CM

## 2016-09-15 DIAGNOSIS — K219 Gastro-esophageal reflux disease without esophagitis: Secondary | ICD-10-CM

## 2016-09-15 DIAGNOSIS — F0280 Dementia in other diseases classified elsewhere without behavioral disturbance: Secondary | ICD-10-CM

## 2016-09-15 DIAGNOSIS — E039 Hypothyroidism, unspecified: Secondary | ICD-10-CM | POA: Diagnosis not present

## 2016-09-15 NOTE — Progress Notes (Signed)
Location:  [facility] Room Number: 78 Place of Service:  SNF (31) Provider:  Bharath Bernstein, Manxie NP  Estill Dooms, MD  Patient Care Team: Estill Dooms, MD as PCP - General (Internal Medicine) Zadie Rhine Clent Demark, MD as Consulting Physician (Ophthalmology) Burnell Blanks, MD as Consulting Physician (Cardiology) Inda Castle, MD as Consulting Physician (Gastroenterology) Jari Pigg, MD as Consulting Physician (Dermatology) Dorna Leitz, MD as Consulting Physician (Orthopedic Surgery) Salina Stanfield X, NP as Nurse Practitioner (Internal Medicine)  Extended Emergency Contact Information Primary Emergency Contact: [facility] Address: [address]          [place], [place] [address] Johnnette Litter of [place] Phone: [phone] Relation: Daughter Secondary Emergency Contact: [place], [place], [place] [address] Johnnette Litter of [place] Phone: [phone] Relation: Son  Code Status:  DNR Goals of care: Advanced Directive information Advanced Directives 09/15/2016  Does Patient Have a Medical Advance Directive? Yes  Type of Advance Directive Out of facility DNR (pink MOST or yellow form)  Does patient want to make changes to medical advance directive? No - Patient declined  Copy of [place] in Chart? Yes  Pre-existing out of facility DNR order (yellow form or pink MOST form) Yellow form placed in chart (order not valid for inpatient use)     Chief Complaint  Patient presents with  . Medical Management of Chronic Issues    HPI:  Pt is a 83 y.o. female seen today for medical management of chronic diseases.     Hx of GERD asymptomatic, takes Famotidine 20mg  daily, last TSH 1.74 07/15/16, on Levothyroxine 66mcg, controlled blood pressure while taking Labetalol 100mg  bid, taking Namenda for memory loss, she is functioning adequately in SNF. Denied pain, constipation, or trouble sleeping at night. Takes Kcl 40meq, last K 4.5  07/15/16 04/07/15-04/09/15 hospitalized, A Chest X-ray revealed a Bilateral Pleuarl Effusions, the left, s/p thorocentesis.    Past Medical History:  Diagnosis Date  . Acute blood loss anemia 03/29/2014   03/30/14 Hgb 7.3 04/04/14 Hgb 8.5 continue Fe bid.  05/04/14 Hgb 10.8   . Benign neoplasm of colon   . Cancer ([place])    pre-melanoma  . Cystocele   . Dementia of the Alzheimer's type 05/22/2014   05/19/14 MMSE 18/30 Namenda.    . Diastolic dysfunction 10/13/6376  . Diverticulosis of colon (without mention of hemorrhage)   . Diverticulosis of large intestine 06/28/2004  . Esophageal reflux   . Essential hypertension 07/05/2013  . Gastroparesis   . Hemorrhoids   . HTN (hypertension)   . Hyperlipidemia 07/05/2013  . Hypertension   . Hypothyroidism   . Internal hemorrhoids without mention of complication 58/11/5025  . Macular degeneration   . Nonspecific abnormal electrocardiogram (ECG) (EKG)   . Osteoarthritis    right hip  . Other and unspecified hyperlipidemia   . Prediabetes 11/17/2013  . Primary osteoarthritis of right hip 03/27/2014  . Shingles   . Skin cancer (melanoma) ([place])   . Stricture and stenosis of esophagus   . Stricture and stenosis of esophagus 10/27/2007  . Vitamin D deficiency 11/17/2013   Past Surgical History:  Procedure Laterality Date  . APPENDECTOMY    . BCC resection     legs/face  . CATARACT EXTRACTION, BILATERAL    . CHOLECYSTECTOMY    . FLEXIBLE SIGMOIDOSCOPY  05/12/2011   Procedure: FLEXIBLE SIGMOIDOSCOPY;  Surgeon: Inda Castle, MD;  [facility];  Service: Endoscopy;  Laterality: N/A;  . FLEXIBLE SIGMOIDOSCOPY  09/10/2011   Procedure: FLEXIBLE SIGMOIDOSCOPY;  Surgeon: Inda Castle, MD;  [facility];  Service: Endoscopy;  Laterality: N/A;  . FLEXIBLE SIGMOIDOSCOPY N/A 08/16/2012   Procedure: FLEXIBLE SIGMOIDOSCOPY;  Surgeon: Inda Castle, MD;  [facility];  Service: Endoscopy;  Laterality: N/A;    . FRACTURE SURGERY     left hip pin  . GALLBLADDER SURGERY  2002  . HEMORRHOID BANDING N/A 08/16/2012   Procedure: HEMORRHOID BANDING;  Surgeon: Inda Castle, MD;  [facility];  Service: Endoscopy;  Laterality: N/A;  . HEMORRHOID SURGERY    . left eye surgery    . melenoma resection     right arm  . THYROIDECTOMY    . TONSILLECTOMY    . TOTAL HIP ARTHROPLASTY Right 03/27/2014   Procedure: TOTAL HIP ARTHROPLASTY ANTERIOR APPROACH;  Surgeon: Alta Corning, MD;  Location: [facility];  Service: Orthopedics;  Laterality: Right;    Allergies  Allergen Reactions  . Ace Inhibitors Other (See Comments)    Elevated potassium  . Statins Other (See Comments)    Myalgias   . Vicodin [Hydrocodone-Acetaminophen] Other (See Comments)    "felt spaced out"  . Fosamax [Alendronate Sodium] Other (See Comments)  . Metoclopramide Hcl Other (See Comments)  . Sulfa Antibiotics Rash  . Sulfonamide Derivatives Rash  . Vesicare [Solifenacin] Other (See Comments)    Outpatient Encounter Prescriptions as of 09/15/2016  Medication Sig  . acetaminophen (TYLENOL) 500 MG tablet Take 1,000 mg by mouth 3 (three) times daily as needed for mild pain or moderate pain.  Marland Kitchen aspirin EC 81 MG tablet Take 81 mg by mouth daily.  . calcium carbonate (CAL-GEST ANTACID) 500 MG chewable tablet Chew 1 tablet by mouth daily as needed for indigestion or heartburn.  . Cholecalciferol (VITAMIN D) 2000 UNITS tablet Take 4,000 Units by mouth daily.  Marland Kitchen docusate sodium (COLACE) 100 MG capsule Take 100 mg by mouth 2 (two) times daily as needed for mild constipation.   . famotidine (PEPCID) 20 MG tablet Take 20 mg by mouth at bedtime.  Marland Kitchen guaiFENesin (ROBITUSSIN) 100 MG/5ML SOLN Take 10 mLs by mouth every 6 (six) hours as needed for cough or to loosen phlegm.  . labetalol (NORMODYNE) 100 MG tablet Take 100 mg by mouth 2 (two) times daily.  Marland Kitchen levothyroxine (SYNTHROID, LEVOTHROID) 75 MCG tablet Take 75 mcg by mouth daily before  breakfast.  . memantine (NAMENDA XR) 28 MG CP24 24 hr capsule Take 28 mg by mouth daily.  . Multiple Vitamins-Minerals (PRESERVISION AREDS 2) CAPS Take 1 capsule by mouth 2 (two) times daily.  Marland Kitchen nystatin-triamcinolone (MYCOLOG II) cream Apply 1 application topically. Apply to affected area once a day  . polyethylene glycol (MIRALAX / GLYCOLAX) packet Take 17 g by mouth every other day.   . potassium chloride SA (K-DUR,KLOR-CON) 20 MEQ tablet Take by mouth. Take 2 tablets (40 meq) once a day  . protein supplement (RESOURCE BENEPROTEIN) POWD Take 1 scoop by mouth. 1 scoop twice a day due to low albumin  . sennosides-docusate sodium (SENOKOT-S) 8.6-50 MG tablet Take 2 tablets by mouth at bedtime.    No facility-administered encounter medications on file as of 09/15/2016.     Review of Systems  Constitutional: Negative for activity change, appetite change, chills, fatigue and fever.  HENT: Positive for hearing loss. Negative for congestion, ear discharge and ear pain.   Eyes: Negative for pain, discharge and  redness.  Respiratory: Negative for cough, shortness of breath and wheezing.   Cardiovascular: Negative for chest pain and leg swelling.  Gastrointestinal: Negative for abdominal pain, constipation, nausea and vomiting.  Genitourinary: Positive for frequency. Negative for dysuria and urgency.  Musculoskeletal: Positive for back pain and gait problem. Negative for myalgias and neck pain.  Skin: Negative for rash.  Neurological: Negative for dizziness, tremors, seizures, weakness and headaches.       Alzheimer's  Hematological:       Hx anemia  Psychiatric/Behavioral: Positive for confusion and decreased concentration. Negative for hallucinations and suicidal ideas. The patient is nervous/anxious.     Immunization History  Administered Date(s) Administered  . Influenza, High Dose Seasonal PF 12/27/2013  . Influenza-Unspecified 01/12/2013, 01/02/2015, 01/29/2016  .  Pneumococcal-Unspecified 07/01/2012  . Td 05/27/2004  . Tdap 02/21/2012  . Zoster 04/15/2007   Pertinent  Health Maintenance Due  Topic Date Due  . PNA vac Low Risk Adult (2 of 2 - PCV13) 07/01/2013  . INFLUENZA VACCINE  11/12/2016  . DEXA SCAN  Completed   Fall Risk  05/22/2014 10/06/2013 06/02/2013  Falls in the past year? No No No  Risk for fall due to : - History of fall(s);Impaired balance/gait;Impaired mobility Impaired mobility   Functional Status Survey:    Vitals:   09/15/16 1510  BP: 140/82  Pulse: 71  Resp: 17  Temp: 98.3 F (36.8 C)  Weight: 135 lb 6.4 oz (61.4 kg)  Height: 5\' 1"  (1.549 m)   Body mass index is 25.58 kg/m. Physical Exam  Constitutional: She appears well-developed and well-nourished. No distress.  HENT:  Head: Normocephalic and atraumatic.  Right Ear: External ear normal.  Mouth/Throat: No oropharyngeal exudate.  Eyes: Conjunctivae and EOM are normal. Pupils are equal, round, and reactive to light. Right eye exhibits no discharge. Left eye exhibits no discharge. No scleral icterus.  lower eyelids redness, mild ectropion noted  Neck: Normal range of motion. Neck supple. No JVD present. No tracheal deviation present. No thyromegaly present.  Cardiovascular: Normal rate, regular rhythm, normal heart sounds and intact distal pulses.   No murmur heard. Pulmonary/Chest: Effort normal. No respiratory distress. She has no wheezes. She has rales. She exhibits no tenderness.  Abdominal: Soft. Bowel sounds are normal. She exhibits no distension and no mass. There is no tenderness.  Musculoskeletal: Normal range of motion. She exhibits no edema or tenderness.  Right hip pain with ROM.  Neurological: She is alert. She has normal reflexes. No cranial nerve deficit. She exhibits normal muscle tone. Coordination normal.  dementia  Skin: Skin is dry. No rash noted. She is not diaphoretic. No pallor.  Right hip surgical incision healed.   Psychiatric: She has a  normal mood and affect. Her behavior is normal. Thought content normal. Her speech is not rapid and/or pressured, not delayed, not tangential and not slurred. Cognition and memory are impaired. She is communicative. She exhibits abnormal recent memory. She exhibits normal remote memory.    Labs reviewed:  Recent Labs  03/18/16 07/15/16 08/19/16  NA 145 143 141  K 4.0 4.5 3.5  BUN 25* 17 22*  CREATININE 0.8 0.8 0.8    Recent Labs  11/27/15 01/22/16  AST 12* 18  ALT 7 15  ALKPHOS 57 121    Recent Labs  01/22/16 03/18/16 07/15/16  WBC 13.8 8.1 7.4  HGB 11.8* 11.4* 12.1  HCT 35* 35* 36  PLT 185 225 265   Lab Results  Component Value Date  TSH 1.74 07/15/2016   Lab Results  Component Value Date   HGBA1C 5.8 (H) 07/06/2014   Lab Results  Component Value Date   CHOL 228 (A) 07/15/2016   HDL 55 07/15/2016   LDLCALC 146 07/15/2016   TRIG 141 07/15/2016   CHOLHDL 3.2 07/06/2014    Significant Diagnostic Results in last 30 days:  No results found.  Assessment/Plan Essential hypertension Controlled, continue Labetalol 100mg  bid. 07/15/16 wbc 7.4, Hgb 12.1, plt 265, TSH 1.74, 08/19/16 Na 141, K 3.5, Bun 22, creat 0.77   Chronic diastolic heart failure (HCC) Clinically compensated. Not on diuretics.   GERD Stable, continue Famotidine 20mg  daily.  Constipation Stable, continue Senokot S II nightly, MiraLax qod, Colace prn  Hypothyroidism TSH 1.74 07/15/16, continue Levothyroxine 64mcg qd  Dementia of the Alzheimer's type 05/19/14 MMSE 18/30, continue Namenda, SNF for care needs  Anemia 07/15/16 Hgb 12.1  Hypokalemia 07/15/16 K 4.5 08/13/16 dc Kcl 08/19/16 K 3.5 09/15/16 CBC BMP     Family/ staff Communication: SNF  Labs/tests ordered: CBC BMP

## 2016-09-15 NOTE — Assessment & Plan Note (Signed)
Stable, continue Famotidine 20mg daily.   

## 2016-09-15 NOTE — Assessment & Plan Note (Signed)
Clinically compensated. Not on diuretics.

## 2016-09-15 NOTE — Assessment & Plan Note (Signed)
05/19/14 MMSE 18/30, continue Namenda, SNF for care needs

## 2016-09-15 NOTE — Assessment & Plan Note (Signed)
Controlled, continue Labetalol 100mg  bid. 07/15/16 wbc 7.4, Hgb 12.1, plt 265, TSH 1.74, 08/19/16 Na 141, K 3.5, Bun 22, creat 0.77

## 2016-09-15 NOTE — Assessment & Plan Note (Signed)
07/15/16 Hgb 12.1

## 2016-09-15 NOTE — Assessment & Plan Note (Signed)
07/15/16 K 4.5 08/13/16 dc Kcl 08/19/16 K 3.5 09/15/16 CBC BMP

## 2016-09-15 NOTE — Assessment & Plan Note (Signed)
Stable, continue Senokot S II nightly, MiraLax qod, Colace prn

## 2016-09-15 NOTE — Assessment & Plan Note (Signed)
TSH 1.74 07/15/16, continue Levothyroxine 81mcg qd

## 2016-09-16 ENCOUNTER — Other Ambulatory Visit: Payer: Self-pay | Admitting: *Deleted

## 2016-09-16 DIAGNOSIS — I10 Essential (primary) hypertension: Secondary | ICD-10-CM | POA: Diagnosis not present

## 2016-09-16 LAB — CBC AND DIFFERENTIAL
HEMATOCRIT: 37 % (ref 36–46)
Hemoglobin: 12.5 g/dL (ref 12.0–16.0)
PLATELETS: 248 10*3/uL (ref 150–399)
WBC: 8.3 10*3/mL

## 2016-09-16 LAB — BASIC METABOLIC PANEL
BUN: 23 mg/dL — AB (ref 4–21)
Creatinine: 0.7 mg/dL (ref <=1.1)
Glucose: 83 mg/dL
Potassium: 3.6 mmol/L (ref 3.4–5.3)
Sodium: 142 mmol/L (ref 137–147)

## 2016-10-23 ENCOUNTER — Non-Acute Institutional Stay (SKILLED_NURSING_FACILITY): Payer: Medicare Other | Admitting: Internal Medicine

## 2016-10-23 ENCOUNTER — Encounter: Payer: Self-pay | Admitting: Internal Medicine

## 2016-10-23 DIAGNOSIS — H906 Mixed conductive and sensorineural hearing loss, bilateral: Secondary | ICD-10-CM | POA: Insufficient documentation

## 2016-10-23 DIAGNOSIS — G308 Other Alzheimer's disease: Secondary | ICD-10-CM | POA: Diagnosis not present

## 2016-10-23 DIAGNOSIS — I10 Essential (primary) hypertension: Secondary | ICD-10-CM | POA: Diagnosis not present

## 2016-10-23 DIAGNOSIS — F0280 Dementia in other diseases classified elsewhere without behavioral disturbance: Secondary | ICD-10-CM

## 2016-10-23 DIAGNOSIS — K649 Unspecified hemorrhoids: Secondary | ICD-10-CM | POA: Diagnosis not present

## 2016-10-23 DIAGNOSIS — K219 Gastro-esophageal reflux disease without esophagitis: Secondary | ICD-10-CM | POA: Diagnosis not present

## 2016-10-23 NOTE — Progress Notes (Signed)
Location:  [facility] Room Number: 33 Place of Service:  SNF [phone]) Provider:  Blanchie Serve MD  Blanchie Serve, MD  Patient Care Team: Blanchie Serve, MD as PCP - General (Internal Medicine) Zadie Rhine Clent Demark, MD as Consulting Physician (Ophthalmology) Burnell Blanks, MD as Consulting Physician (Cardiology) Inda Castle, MD as Consulting Physician (Gastroenterology) Jari Pigg, MD as Consulting Physician (Dermatology) Dorna Leitz, MD as Consulting Physician (Orthopedic Surgery) Mast, Man X, NP as Nurse Practitioner (Internal Medicine)  Extended Emergency Contact Information Primary Emergency Contact: [facility] Address: [address]          [place], [place] [address] Johnnette Litter of [place] Phone: [phone] Relation: Daughter Secondary Emergency Contact: In[place], Dike [place], [place] [address] Johnnette Litter of [place] Phone: [phone] Relation: Son  Code Status:  DNR Goals of care: Advanced Directive information Advanced Directives 09/15/2016  Does Patient Have a Medical Advance Directive? Yes  Type of Advance Directive Out of facility DNR (pink MOST or yellow form)  Does patient want to make changes to medical advance directive? No - Patient declined  Copy of [place] in Chart? Yes  Pre-existing out of facility DNR order (yellow form or pink MOST form) Yellow form placed in chart (order not valid for inpatient use)     Chief Complaint  Patient presents with  . Medical Management of Chronic Issues    Routine Visit     HPI:  Pt is a 83 y.o. female seen today for medical management of chronic diseases. She has been at her baseline per nursing. She is out of bed daily and gets around with her walker. No fall has been reported. Her hemorrhoids have been stable.    Past Medical History:  Diagnosis Date  . Acute blood loss anemia 03/29/2014   03/30/14 Hgb 7.3 04/04/14 Hgb 8.5 continue Fe bid.   05/04/14 Hgb 10.8   . Benign neoplasm of colon   . Cancer ([place])    pre-melanoma  . Cystocele   . Dementia of the Alzheimer's type 05/22/2014   05/19/14 MMSE 18/30 Namenda.    . Diastolic dysfunction 2/72/5366  . Diverticulosis of colon (without mention of hemorrhage)   . Diverticulosis of large intestine 06/28/2004  . Esophageal reflux   . Essential hypertension 07/05/2013  . Gastroparesis   . Hemorrhoids   . HTN (hypertension)   . Hyperlipidemia 07/05/2013  . Hypertension   . Hypothyroidism   . Internal hemorrhoids without mention of complication 44/0/3474  . Macular degeneration   . Nonspecific abnormal electrocardiogram (ECG) (EKG)   . Osteoarthritis    right hip  . Other and unspecified hyperlipidemia   . Prediabetes 11/17/2013  . Primary osteoarthritis of right hip 03/27/2014  . Shingles   . Skin cancer (melanoma) ([place])   . Stricture and stenosis of esophagus   . Stricture and stenosis of esophagus 10/27/2007  . Vitamin D deficiency 11/17/2013   Past Surgical History:  Procedure Laterality Date  . APPENDECTOMY    . BCC resection     legs/face  . CATARACT EXTRACTION, BILATERAL    . CHOLECYSTECTOMY    . FLEXIBLE SIGMOIDOSCOPY  05/12/2011   Procedure: FLEXIBLE SIGMOIDOSCOPY;  Surgeon: Inda Castle, MD;  Location: [facility];  Service: Endoscopy;  Laterality: N/A;  . FLEXIBLE SIGMOIDOSCOPY  09/10/2011   Procedure: FLEXIBLE SIGMOIDOSCOPY;  Surgeon: Inda Castle, MD;  [facility];  Service: Endoscopy;  Laterality: N/A;  .  FLEXIBLE SIGMOIDOSCOPY N/A 08/16/2012   Procedure: FLEXIBLE SIGMOIDOSCOPY;  Surgeon: Inda Castle, MD;  [facility];  Service: Endoscopy;  Laterality: N/A;  . FRACTURE SURGERY     left hip pin  . GALLBLADDER SURGERY  2002  . HEMORRHOID BANDING N/A 08/16/2012   Procedure: HEMORRHOID BANDING;  Surgeon: Inda Castle, MD;  [facility];  Service: Endoscopy;  Laterality: N/A;  . HEMORRHOID SURGERY    . left eye surgery    .  melenoma resection     right arm  . THYROIDECTOMY    . TONSILLECTOMY    . TOTAL HIP ARTHROPLASTY Right 03/27/2014   Procedure: TOTAL HIP ARTHROPLASTY ANTERIOR APPROACH;  Surgeon: Alta Corning, MD;  Location: [facility];  Service: Orthopedics;  Laterality: Right;    Allergies  Allergen Reactions  . Ace Inhibitors Other (See Comments)    Elevated potassium  . Statins Other (See Comments)    Myalgias   . Vicodin [Hydrocodone-Acetaminophen] Other (See Comments)    "felt spaced out"  . Fosamax [Alendronate Sodium] Other (See Comments)  . Metoclopramide Hcl Other (See Comments)  . Sulfa Antibiotics Rash  . Sulfonamide Derivatives Rash  . Vesicare [Solifenacin] Other (See Comments)    Outpatient Encounter Prescriptions as of 10/23/2016  Medication Sig  . acetaminophen (TYLENOL) 500 MG tablet Take 1,000 mg by mouth 3 (three) times daily as needed for mild pain or moderate pain.  Marland Kitchen aspirin EC 81 MG tablet Take 81 mg by mouth daily.  . calcium carbonate (CAL-GEST ANTACID) 500 MG chewable tablet Chew 1 tablet by mouth daily as needed for indigestion or heartburn.  . Cholecalciferol (VITAMIN D) 2000 UNITS tablet Take 4,000 Units by mouth daily.  Marland Kitchen docusate sodium (COLACE) 100 MG capsule Take 100 mg by mouth 2 (two) times daily as needed for mild constipation.   . famotidine (PEPCID) 20 MG tablet Take 20 mg by mouth at bedtime.  Marland Kitchen guaiFENesin (ROBITUSSIN) 100 MG/5ML SOLN Take 10 mLs by mouth every 6 (six) hours as needed for cough or to loosen phlegm.  . labetalol (NORMODYNE) 100 MG tablet Take 100 mg by mouth 2 (two) times daily.  Marland Kitchen levothyroxine (SYNTHROID, LEVOTHROID) 75 MCG tablet Take 75 mcg by mouth daily before breakfast.  . memantine (NAMENDA XR) 28 MG CP24 24 hr capsule Take 28 mg by mouth daily.  . Multiple Vitamins-Minerals (PRESERVISION AREDS 2) CAPS Take 1 capsule by mouth 2 (two) times daily.  Marland Kitchen nystatin-triamcinolone (MYCOLOG II) cream Apply 1 application topically. Apply to  affected area once a day  . polyethylene glycol (MIRALAX / GLYCOLAX) packet Take 17 g by mouth every other day.   . protein supplement (RESOURCE BENEPROTEIN) POWD Take 1 scoop by mouth 2 (two) times daily.   . sennosides-docusate sodium (SENOKOT-S) 8.6-50 MG tablet Take 2 tablets by mouth at bedtime.   . [DISCONTINUED] potassium chloride SA (K-DUR,KLOR-CON) 20 MEQ tablet Take by mouth. Take 2 tablets (40 meq) once a day   No facility-administered encounter medications on file as of 10/23/2016.     Review of Systems  Constitutional: Negative for appetite change, chills, diaphoresis, fatigue and fever.  HENT: Positive for hearing loss. Negative for congestion, mouth sores, sore throat and trouble swallowing.        Has hearing aids  Respiratory: Negative for cough and shortness of breath.   Cardiovascular: Negative for chest pain and palpitations.  Gastrointestinal: Positive for constipation. Negative for abdominal pain, blood in stool, diarrhea, nausea and vomiting.  Genitourinary: Negative for dysuria.  Musculoskeletal: Positive for arthralgias and gait problem.  Neurological: Negative for dizziness, tremors, seizures and headaches.  Psychiatric/Behavioral: Positive for confusion.    Immunization History  Administered Date(s) Administered  . Influenza, High Dose Seasonal PF 12/27/2013  . Influenza-Unspecified 01/12/2013, 01/02/2015, 01/29/2016  . Pneumococcal-Unspecified 07/01/2012  . Td 05/27/2004  . Tdap 02/21/2012  . Zoster 04/15/2007   Pertinent  Health Maintenance Due  Topic Date Due  . PNA vac Low Risk Adult (2 of 2 - PCV13) 04/14/2017 (Originally 07/01/2013)  . INFLUENZA VACCINE  11/12/2016  . DEXA SCAN  Completed   Fall Risk  05/22/2014 10/06/2013 06/02/2013  Falls in the past year? No No No  Risk for fall due to : - History of fall(s);Impaired balance/gait;Impaired mobility Impaired mobility   Functional Status Survey:    Vitals:   10/23/16 1121  BP: (!) 158/62    Pulse: 63  Resp: 20  Temp: 98.9 F (37.2 C)  TempSrc: Oral  Weight: 136 lb 14.4 oz (62.1 kg)  Height: 5\' 1"  (1.549 m)   Body mass index is 25.87 kg/m. Physical Exam  Constitutional: She appears well-developed and well-nourished. No distress.  HENT:  Head: Normocephalic and atraumatic.  Mouth/Throat: Oropharynx is clear and moist.  Hearing aids present  Eyes: Pupils are equal, round, and reactive to light. Conjunctivae are normal. Right eye exhibits no discharge. Left eye exhibits no discharge.  Neck: Normal range of motion. Neck supple. No thyromegaly present.  Cardiovascular: Normal rate and regular rhythm.   Pulmonary/Chest: Effort normal and breath sounds normal.  Abdominal: Soft. Bowel sounds are normal. There is no tenderness.  Musculoskeletal: Normal range of motion.  Has arthritis changes, Can move all 4 extremities, uses a walker.   Lymphadenopathy:    She has no cervical adenopathy.  Neurological: She is alert.  Oriented only to self, has dementia  Skin: Skin is warm and dry. She is not diaphoretic.  Psychiatric: She has a normal mood and affect.    Labs reviewed:  Recent Labs  07/15/16 08/19/16 09/16/16  NA 143 141 142  K 4.5 3.5 3.6  BUN 17 22* 23*  CREATININE 0.8 0.8 0.7    Recent Labs  11/27/15 01/22/16  AST 12* 18  ALT 7 15  ALKPHOS 57 121    Recent Labs  03/18/16 07/15/16 09/16/16  WBC 8.1 7.4 8.3  HGB 11.4* 12.1 12.5  HCT 35* 36 37  PLT 225 265 248   Lab Results  Component Value Date   TSH 1.74 07/15/2016   Lab Results  Component Value Date   HGBA1C 5.8 (H) 07/06/2014   Lab Results  Component Value Date   CHOL 228 (A) 07/15/2016   HDL 55 07/15/2016   LDLCALC 146 07/15/2016   TRIG 141 07/15/2016   CHOLHDL 3.2 07/06/2014    Significant Diagnostic Results in last 30 days:  No results found.  Assessment/Plan  gerd With history of GI bleed. Continue famotidine for now, stable h&h  Hemorrhoids No bleed reported. Avoid  constipation. Continue senokot s 2 tab qhs and miralx qod. Maintain hydration  alzheimer's dementia No behavioral disturbance reported. Continue memantine xr 28 mg daily, supportive care  Hypertension stable BP reading on review overall. Currently on labetalol 100 mg bid. Reviewed renal function  Hearing loss Has hearing aid, supportive care  Family/ staff Communication:   Labs/tests ordered:     Blanchie Serve, MD Internal Medicine [facility]  [place],  [address] Cell Phone (Monday-Friday 8 am - 5 pm): [phone] On Call: [phone] and follow prompts after 5 pm and on weekends Office Phone: [phone] Office Fax: [phone]

## 2016-10-28 DIAGNOSIS — H35052 Retinal neovascularization, unspecified, left eye: Secondary | ICD-10-CM | POA: Diagnosis not present

## 2016-10-28 DIAGNOSIS — H353221 Exudative age-related macular degeneration, left eye, with active choroidal neovascularization: Secondary | ICD-10-CM | POA: Diagnosis not present

## 2016-10-28 DIAGNOSIS — H353134 Nonexudative age-related macular degeneration, bilateral, advanced atrophic with subfoveal involvement: Secondary | ICD-10-CM | POA: Diagnosis not present

## 2016-10-28 DIAGNOSIS — H35352 Cystoid macular degeneration, left eye: Secondary | ICD-10-CM | POA: Diagnosis not present

## 2016-10-28 DIAGNOSIS — H353212 Exudative age-related macular degeneration, right eye, with inactive choroidal neovascularization: Secondary | ICD-10-CM | POA: Diagnosis not present

## 2016-11-14 ENCOUNTER — Non-Acute Institutional Stay (SKILLED_NURSING_FACILITY): Payer: Medicare Other | Admitting: Nurse Practitioner

## 2016-11-14 ENCOUNTER — Encounter: Payer: Self-pay | Admitting: Nurse Practitioner

## 2016-11-14 DIAGNOSIS — K222 Esophageal obstruction: Secondary | ICD-10-CM | POA: Diagnosis not present

## 2016-11-14 DIAGNOSIS — J90 Pleural effusion, not elsewhere classified: Secondary | ICD-10-CM

## 2016-11-14 DIAGNOSIS — R059 Cough, unspecified: Secondary | ICD-10-CM

## 2016-11-14 DIAGNOSIS — I5032 Chronic diastolic (congestive) heart failure: Secondary | ICD-10-CM

## 2016-11-14 DIAGNOSIS — R05 Cough: Secondary | ICD-10-CM | POA: Diagnosis not present

## 2016-11-14 NOTE — Assessment & Plan Note (Signed)
Clinically compensated. Not on diuretics.

## 2016-11-14 NOTE — Assessment & Plan Note (Signed)
Exudative left pleural effusion-status post removal of 850 ml of pleural fluid Inflammation pleural fluid culture and cytology 04/16/15 CXR left basilar opacity likely representing infectious process, chronic chest findings are present. 07/07/15 CXR cardiomegaly, no acute pulmonary pathology. 08/21/15 CXR no acute cardiopulmonary

## 2016-11-14 NOTE — Assessment & Plan Note (Signed)
Long term acid reducer, Famotidine 20mg  daily. Crystal Petersen has speech therapy to evaluation. Observe the patient.

## 2016-11-14 NOTE — Progress Notes (Signed)
Location:  [facility] Room Number: 59 Place of Service:  SNF (31) Provider:  Naeemah Jasmer, Manxie  NP  Blanchie Serve, MD  Patient Care Team: Blanchie Serve, MD as PCP - General (Internal Medicine) Zadie Rhine Clent Demark, MD as Consulting Physician (Ophthalmology) Burnell Blanks, MD as Consulting Physician (Cardiology) Inda Castle, MD as Consulting Physician (Gastroenterology) Jari Pigg, MD as Consulting Physician (Dermatology) Dorna Leitz, MD as Consulting Physician (Orthopedic Surgery) Azusena Erlandson X, NP as Nurse Practitioner (Internal Medicine)  Extended Emergency Contact Information Primary Emergency Contact: [facility] Address: [address]          [place], [place] [address] Johnnette Litter of [place] Phone: [phone] Relation: Daughter Secondary Emergency Contact: Elianne, Gub[place], [place] [address] Johnnette Litter of [place] Phone: [phone] Relation: Son  Code Status:  DNR Goals of care: Advanced Directive information Advanced Directives 11/14/2016  Does Patient Have a Medical Advance Directive? Yes  Type of Advance Directive Out of facility DNR (pink MOST or yellow form)  Does patient want to make changes to medical advance directive? No - Patient declined  Copy of [place] in Chart? -  Pre-existing out of facility DNR order (yellow form or pink MOST form) Yellow form placed in chart (order not valid for inpatient use)     Chief Complaint  Patient presents with  . Acute Visit    cough during meal, thick sputum x 1-2 weeks    HPI:  Pt is a 83 y.o. female seen today for an acute visit for cough, POA reported a large amount of  thick sputum on the floor in the resident's room, the patient had been coughing, spitting up thick sputum about 2 weeks. She denied chest pain, SOB, palpitation, her cough doesn't interfere her night sleep, her appetite remains the same, no O2 desaturation, she is afebrile.   Hx of  pleural effusion, status post removal of 850 ml of pleural fluid Inflammation pleural fluid culture and cytology.   Hx of CHF, Clinically compensated. Not on diuretics.   Long term acid reducer, Famotidine 20mg  daily. Will has speech therapy to evaluation. Observe the patient.      Past Medical History:  Diagnosis Date  . Acute blood loss anemia 03/29/2014   03/30/14 Hgb 7.3 04/04/14 Hgb 8.5 continue Fe bid.  05/04/14 Hgb 10.8   . Benign neoplasm of colon   . Cancer ([place])    pre-melanoma  . Cystocele   . Dementia of the Alzheimer's type 05/22/2014   05/19/14 MMSE 18/30 Namenda.    . Diastolic dysfunction 4/00/8676  . Diverticulosis of colon (without mention of hemorrhage)   . Diverticulosis of large intestine 06/28/2004  . Esophageal reflux   . Essential hypertension 07/05/2013  . Gastroparesis   . Hemorrhoids   . HTN (hypertension)   . Hyperlipidemia 07/05/2013  . Hypertension   . Hypothyroidism   . Internal hemorrhoids without mention of complication 19/08/930  . Macular degeneration   . Nonspecific abnormal electrocardiogram (ECG) (EKG)   . Osteoarthritis    right hip  . Other and unspecified hyperlipidemia   . Prediabetes 11/17/2013  . Primary osteoarthritis of right hip 03/27/2014  . Shingles   . Skin cancer (melanoma) ([place])   . Stricture and stenosis of esophagus   . Stricture and stenosis of esophagus 10/27/2007  . Vitamin D deficiency 11/17/2013   Past Surgical History:  Procedure Laterality Date  . APPENDECTOMY    .  BCC resection     legs/face  . CATARACT EXTRACTION, BILATERAL    . CHOLECYSTECTOMY    . FLEXIBLE SIGMOIDOSCOPY  05/12/2011   Procedure: FLEXIBLE SIGMOIDOSCOPY;  Surgeon: Inda Castle, MD;  [facility];  Service: Endoscopy;  Laterality: N/A;  . FLEXIBLE SIGMOIDOSCOPY  09/10/2011   Procedure: FLEXIBLE SIGMOIDOSCOPY;  Surgeon: Inda Castle, MD;  [facility];  Service: Endoscopy;  Laterality: N/A;  . FLEXIBLE SIGMOIDOSCOPY N/A  08/16/2012   Procedure: FLEXIBLE SIGMOIDOSCOPY;  Surgeon: Inda Castle, MD;  [facility];  Service: Endoscopy;  Laterality: N/A;  . FRACTURE SURGERY     left hip pin  . GALLBLADDER SURGERY  2002  . HEMORRHOID BANDING N/A 08/16/2012   Procedure: HEMORRHOID BANDING;  Surgeon: Inda Castle, MD;  [facility];  Service: Endoscopy;  Laterality: N/A;  . HEMORRHOID SURGERY    . left eye surgery    . melenoma resection     right arm  . THYROIDECTOMY    . TONSILLECTOMY    . TOTAL HIP ARTHROPLASTY Right 03/27/2014   Procedure: TOTAL HIP ARTHROPLASTY ANTERIOR APPROACH;  Surgeon: Alta Corning, MD;  Location: [facility];  Service: Orthopedics;  Laterality: Right;    Allergies  Allergen Reactions  . Ace Inhibitors Other (See Comments)    Elevated potassium  . Statins Other (See Comments)    Myalgias   . Vicodin [Hydrocodone-Acetaminophen] Other (See Comments)    "felt spaced out"  . Fosamax [Alendronate Sodium] Other (See Comments)  . Metoclopramide Hcl Other (See Comments)  . Sulfa Antibiotics Rash  . Sulfonamide Derivatives Rash  . Vesicare [Solifenacin] Other (See Comments)    Outpatient Encounter Prescriptions as of 11/14/2016  Medication Sig  . acetaminophen (TYLENOL) 500 MG tablet Take 1,000 mg by mouth 3 (three) times daily as needed for mild pain or moderate pain.  Marland Kitchen aspirin EC 81 MG tablet Take 81 mg by mouth daily.  . calcium carbonate (CAL-GEST ANTACID) 500 MG chewable tablet Chew 1 tablet by mouth daily as needed for indigestion or heartburn.  . Cholecalciferol (VITAMIN D) 2000 UNITS tablet Take 4,000 Units by mouth daily.  Marland Kitchen docusate sodium (COLACE) 100 MG capsule Take 100 mg by mouth 2 (two) times daily as needed for mild constipation.   . famotidine (PEPCID) 20 MG tablet Take 20 mg by mouth at bedtime.  Marland Kitchen guaiFENesin (ROBITUSSIN) 100 MG/5ML SOLN Take 10 mLs by mouth every 6 (six) hours as needed for cough or to loosen phlegm.  . labetalol (NORMODYNE) 100 MG  tablet Take 100 mg by mouth 2 (two) times daily.  Marland Kitchen levothyroxine (SYNTHROID, LEVOTHROID) 75 MCG tablet Take 75 mcg by mouth daily before breakfast.  . memantine (NAMENDA XR) 28 MG CP24 24 hr capsule Take 28 mg by mouth daily.  . Multiple Vitamins-Minerals (PRESERVISION AREDS 2) CAPS Take 1 capsule by mouth 2 (two) times daily.  Marland Kitchen nystatin-triamcinolone (MYCOLOG II) cream Apply 1 application topically. Apply to affected area once a day  . polyethylene glycol (MIRALAX / GLYCOLAX) packet Take 17 g by mouth every other day.   . protein supplement (RESOURCE BENEPROTEIN) POWD Take 1 scoop by mouth 2 (two) times daily.   . sennosides-docusate sodium (SENOKOT-S) 8.6-50 MG tablet Take 2 tablets by mouth at bedtime.    No facility-administered encounter medications on file as of 11/14/2016.     Review of Systems  Constitutional: Negative for activity change, appetite change, chills, diaphoresis, fatigue, fever and unexpected weight change.  HENT:  Positive for hearing loss. Negative for congestion, mouth sores, rhinorrhea, sinus pain, sore throat, trouble swallowing and voice change.        Has hearing aids  Eyes: Negative for visual disturbance.  Respiratory: Positive for cough. Negative for choking, chest tightness, shortness of breath and wheezing.        Cough, POA reported a large amount of  thick sputum on the floor in the resident's room, the patient had been coughing, spitting up thick sputum about 2 weeks.   Cardiovascular: Negative for chest pain and palpitations.  Gastrointestinal: Positive for constipation. Negative for abdominal pain, blood in stool, diarrhea, nausea and vomiting.  Genitourinary: Negative for dysuria, frequency and urgency.  Musculoskeletal: Positive for gait problem.       Ambulates with walker.   Skin: Negative for rash.  Allergic/Immunologic: Negative for environmental allergies and food allergies.  Neurological: Negative for dizziness and headaches.    Psychiatric/Behavioral: Positive for confusion.    Immunization History  Administered Date(s) Administered  . Influenza, High Dose Seasonal PF 12/27/2013  . Influenza-Unspecified 01/12/2013, 01/02/2015, 01/29/2016  . Pneumococcal-Unspecified 07/01/2012  . Td 05/27/2004  . Tdap 02/21/2012  . Zoster 04/15/2007   Pertinent  Health Maintenance Due  Topic Date Due  . INFLUENZA VACCINE  11/12/2016  . PNA vac Low Risk Adult (2 of 2 - PCV13) 04/14/2017 (Originally 07/01/2013)  . DEXA SCAN  Completed   Fall Risk  05/22/2014 10/06/2013 06/02/2013  Falls in the past year? No No No  Risk for fall due to : - History of fall(s);Impaired balance/gait;Impaired mobility Impaired mobility   Functional Status Survey:    Vitals:   11/14/16 1124  BP: (!) 148/88  Pulse: 68  Resp: 18  Temp: 97.8 F (36.6 C)  SpO2: 96%  Weight: 136 lb 1.6 oz (61.7 kg)  Height: 5\' 1"  (1.549 m)   Body mass index is 25.72 kg/m. Physical Exam  Constitutional: She appears well-developed and well-nourished. No distress.  HENT:  Head: Normocephalic and atraumatic.  Mouth/Throat: Oropharynx is clear and moist. No oropharyngeal exudate.  Hearing aids present  Eyes: Pupils are equal, round, and reactive to light. Conjunctivae are normal. Right eye exhibits no discharge. Left eye exhibits no discharge.  Neck: Normal range of motion. Neck supple.  Cardiovascular: Normal rate and regular rhythm.   Pulmonary/Chest: Effort normal and breath sounds normal. No respiratory distress. She has no wheezes. She has no rales. She exhibits no tenderness.  Abdominal: Soft. Bowel sounds are normal. She exhibits no distension. There is no tenderness.  Musculoskeletal: Normal range of motion.  Ambulates with walker.   Neurological: She is alert.  dementia  Skin: Skin is warm and dry. She is not diaphoretic.  Psychiatric: She has a normal mood and affect.    Labs reviewed:  Recent Labs  07/15/16 08/19/16 09/16/16  NA 143 141 142   K 4.5 3.5 3.6  BUN 17 22* 23*  CREATININE 0.8 0.8 0.7    Recent Labs  11/27/15 01/22/16  AST 12* 18  ALT 7 15  ALKPHOS 57 121    Recent Labs  03/18/16 07/15/16 09/16/16  WBC 8.1 7.4 8.3  HGB 11.4* 12.1 12.5  HCT 35* 36 37  PLT 225 265 248   Lab Results  Component Value Date   TSH 1.74 07/15/2016   Lab Results  Component Value Date   HGBA1C 5.8 (H) 07/06/2014   Lab Results  Component Value Date   CHOL 228 (A) 07/15/2016   HDL 55  07/15/2016   LDLCALC 146 07/15/2016   TRIG 141 07/15/2016   CHOLHDL 3.2 07/06/2014    Significant Diagnostic Results in last 30 days:  No results found.  Assessment/Plan Pleural effusion Exudative left pleural effusion-status post removal of 850 ml of pleural fluid Inflammation pleural fluid culture and cytology 04/16/15 CXR left basilar opacity likely representing infectious process, chronic chest findings are present. 07/07/15 CXR cardiomegaly, no acute pulmonary pathology. 08/21/15 CXR no acute cardiopulmonary   Chronic diastolic heart failure ([place]) Clinically compensated. Not on diuretics.   Stricture and stenosis of esophagus Long term acid reducer, Famotidine 20mg  daily. Will has speech therapy to evaluation. Observe the patient.   Cough Obtain CXR AP and Lateral views to rule out pulmonary process, CBC, CMP     Family/ staff Communication: plan of care reviewed with the patient, her daughter, and charge nurse.   Labs/tests ordered:  CBC CMP CXR AP and Lateral view, speech therapy to evaluate.   Time spend 45 minutes

## 2016-11-14 NOTE — Assessment & Plan Note (Addendum)
Obtain CXR AP and Lateral views to rule out pulmonary process, CBC, CMP

## 2016-11-17 ENCOUNTER — Encounter: Payer: Self-pay | Admitting: Nurse Practitioner

## 2016-11-17 ENCOUNTER — Non-Acute Institutional Stay (SKILLED_NURSING_FACILITY): Payer: Medicare Other | Admitting: Nurse Practitioner

## 2016-11-17 DIAGNOSIS — J189 Pneumonia, unspecified organism: Secondary | ICD-10-CM | POA: Diagnosis not present

## 2016-11-17 DIAGNOSIS — I5032 Chronic diastolic (congestive) heart failure: Secondary | ICD-10-CM | POA: Diagnosis not present

## 2016-11-17 DIAGNOSIS — J90 Pleural effusion, not elsewhere classified: Secondary | ICD-10-CM

## 2016-11-17 NOTE — Assessment & Plan Note (Signed)
11/14/16 CXR patchy interstitial changes are present right lower lung suspicious of PNA, mild to moderate carediomegaly with moderate elevation of the pulmonary vasculature suggesting mild congestive heart failure. 10 day course Avelox 400mg  po qd started 11/14/16, tolerated. She is afebrile. Respiratory symptoms are improving.  Furosemide 20mg  po qd, Kcl 11meq po qd x 3 days, then update BMP BNP

## 2016-11-17 NOTE — Assessment & Plan Note (Signed)
Exudative left pleural effusion-status post removal of 850 ml of pleural fluid Inflammation pleural fluid culture and cytology 04/16/15 CXR left basilar opacity likely representing infectious process, chronic chest findings are present. 07/07/15 CXR cardiomegaly, no acute pulmonary pathology. 08/21/15 CXR no acute cardiopulmonary  11/14/16 CXR patchy interstitial changes are present right lower lung suspicious of PNA, mild to moderate carediomegaly with moderate elevation of the pulmonary vasculature suggesting mild congestive heart failure.

## 2016-11-17 NOTE — Progress Notes (Signed)
Location:  [facility] Room Number: 55 Place of Service:  SNF (31) Provider:  Johnesha Acheampong, Manxie  NP  Blanchie Serve, MD  Patient Care Team: Blanchie Serve, MD as PCP - General (Internal Medicine) Zadie Rhine Clent Demark, MD as Consulting Physician (Ophthalmology) Burnell Blanks, MD as Consulting Physician (Cardiology) Inda Castle, MD as Consulting Physician (Gastroenterology) Jari Pigg, MD as Consulting Physician (Dermatology) Dorna Leitz, MD as Consulting Physician (Orthopedic Surgery) Caydee Talkington X, NP as Nurse Practitioner (Internal Medicine)  Extended Emergency Contact Information Primary Emergency Contact: [facility] Address: [address]          [place], [place] [address] Johnnette Litter of [place] Phone: [phone] Relation: Daughter Secondary Emergency Contact: Aaralynn, Shepheard Secretary, [place] [address] Johnnette Litter of [place] Phone: [phone] Relation: Son  Code Status:  DNR Goals of care: Advanced Directive information Advanced Directives 11/17/2016  Does Patient Have a Medical Advance Directive? Yes  Type of Advance Directive Out of facility DNR (pink MOST or yellow form)  Does patient want to make changes to medical advance directive? No - Patient declined  Copy of [place] in Chart? -  Pre-existing out of facility DNR order (yellow form or pink MOST form) Yellow form placed in chart (order not valid for inpatient use)     Chief Complaint  Patient presents with  . Acute Visit    pneumonia    HPI:  Pt is a 83 y.o. female seen today for an acute visit for PNA, 11/14/16 CXR patchy interstitial changes are present right lower lung suspicious of PNA, mild to moderate carediomegaly with moderate elevation of the pulmonary vasculature suggesting mild congestive heart failure. 10 day course Avelox 400mg  po qd started 11/14/16, tolerated. She is afebrile. Respiratory symptoms are improving.    Hx of CHF, not  taking diuretics, no chronic edema. Hx of pleural effusion, s/s thoracentesis with 826ml fluid removed, it was inflammation fluid per cultur eand cytology.  Past Medical History:  Diagnosis Date  . Acute blood loss anemia 03/29/2014   03/30/14 Hgb 7.3 04/04/14 Hgb 8.5 continue Fe bid.  05/04/14 Hgb 10.8   . Benign neoplasm of colon   . Cancer ([facility])    pre-melanoma  . Cystocele   . Dementia of the Alzheimer's type 05/22/2014   05/19/14 MMSE 18/30 Namenda.    . Diastolic dysfunction 9/47/0962  . Diverticulosis of colon (without mention of hemorrhage)   . Diverticulosis of large intestine 06/28/2004  . Esophageal reflux   . Essential hypertension 07/05/2013  . Gastroparesis   . Hemorrhoids   . HTN (hypertension)   . Hyperlipidemia 07/05/2013  . Hypertension   . Hypothyroidism   . Internal hemorrhoids without mention of complication 83/09/6292  . Macular degeneration   . Nonspecific abnormal electrocardiogram (ECG) (EKG)   . Osteoarthritis    right hip  . Other and unspecified hyperlipidemia   . Prediabetes 11/17/2013  . Primary osteoarthritis of right hip 03/27/2014  . Shingles   . Skin cancer (melanoma) ([facility])   . Stricture and stenosis of esophagus   . Stricture and stenosis of esophagus 10/27/2007  . Vitamin D deficiency 11/17/2013   Past Surgical History:  Procedure Laterality Date  . APPENDECTOMY    . BCC resection     legs/face  . CATARACT EXTRACTION, BILATERAL    . CHOLECYSTECTOMY    . FLEXIBLE SIGMOIDOSCOPY  05/12/2011   Procedure: FLEXIBLE SIGMOIDOSCOPY;  Surgeon: Herbie Baltimore  Shaaron Adler, MD;  Location: [facility];  Service: Endoscopy;  Laterality: N/A;  . FLEXIBLE SIGMOIDOSCOPY  09/10/2011   Procedure: FLEXIBLE SIGMOIDOSCOPY;  Surgeon: Inda Castle, MD;  Location: [facility];  Service: Endoscopy;  Laterality: N/A;  . FLEXIBLE SIGMOIDOSCOPY N/A 08/16/2012   Procedure: FLEXIBLE SIGMOIDOSCOPY;  Surgeon: Inda Castle, MD;  Location: [facility];  Service: Endoscopy;  Laterality:  N/A;  . FRACTURE SURGERY     left hip pin  . GALLBLADDER SURGERY  2002  . HEMORRHOID BANDING N/A 08/16/2012   Procedure: HEMORRHOID BANDING;  Surgeon: Inda Castle, MD;  Location: [facility];  Service: Endoscopy;  Laterality: N/A;  . HEMORRHOID SURGERY    . left eye surgery    . melenoma resection     right arm  . THYROIDECTOMY    . TONSILLECTOMY    . TOTAL HIP ARTHROPLASTY Right 03/27/2014   Procedure: TOTAL HIP ARTHROPLASTY ANTERIOR APPROACH;  Surgeon: Alta Corning, MD;  Location: [facility];  Service: Orthopedics;  Laterality: Right;    Allergies  Allergen Reactions  . Ace Inhibitors Other (See Comments)    Elevated potassium  . Statins Other (See Comments)    Myalgias   . Vicodin [Hydrocodone-Acetaminophen] Other (See Comments)    "felt spaced out"  . Fosamax [Alendronate Sodium] Other (See Comments)  . Metoclopramide Hcl Other (See Comments)  . Sulfa Antibiotics Rash  . Sulfonamide Derivatives Rash  . Vesicare [Solifenacin] Other (See Comments)    Outpatient Encounter Prescriptions as of 11/17/2016  Medication Sig  . acetaminophen (TYLENOL) 500 MG tablet Take 1,000 mg by mouth 3 (three) times daily as needed for mild pain or moderate pain.  Marland Kitchen aspirin EC 81 MG tablet Take 81 mg by mouth daily.  . calcium carbonate (CAL-GEST ANTACID) 500 MG chewable tablet Chew 1 tablet by mouth daily as needed for indigestion or heartburn.  . Cholecalciferol (VITAMIN D) 2000 UNITS tablet Take 4,000 Units by mouth daily.  Marland Kitchen docusate sodium (COLACE) 100 MG capsule Take 100 mg by mouth 2 (two) times daily as needed for mild constipation.   . famotidine (PEPCID) 20 MG tablet Take 20 mg by mouth at bedtime.  Marland Kitchen guaiFENesin (ROBITUSSIN) 100 MG/5ML SOLN Take 10 mLs by mouth every 6 (six) hours as needed for cough or to loosen phlegm.  . labetalol (NORMODYNE) 100 MG tablet Take 100 mg by mouth 2 (two) times daily.  Marland Kitchen levothyroxine (SYNTHROID, LEVOTHROID) 75 MCG tablet Take 75 mcg by mouth daily  before breakfast.  . memantine (NAMENDA XR) 28 MG CP24 24 hr capsule Take 28 mg by mouth daily.  Marland Kitchen moxifloxacin (AVELOX) 400 MG tablet Take 400 mg by mouth daily at 8 pm.  . Multiple Vitamins-Minerals (PRESERVISION AREDS 2) CAPS Take 1 capsule by mouth 2 (two) times daily.  Marland Kitchen nystatin-triamcinolone (MYCOLOG II) cream Apply 1 application topically. Apply to affected area once a day  . polyethylene glycol (MIRALAX / GLYCOLAX) packet Take 17 g by mouth every other day.   . protein supplement (RESOURCE BENEPROTEIN) POWD Take 1 scoop by mouth 2 (two) times daily.   Marland Kitchen saccharomyces boulardii (FLORASTOR) 250 MG capsule Take 250 mg by mouth 2 (two) times daily.  . sennosides-docusate sodium (SENOKOT-S) 8.6-50 MG tablet Take 2 tablets by mouth at bedtime.    No facility-administered encounter medications on file as of 11/17/2016.     Review of Systems  Constitutional: Negative for activity change, appetite change, fatigue and fever.  HENT: Positive for hearing loss. Negative  for congestion, mouth sores, trouble swallowing and voice change.        Has hearing aids  Eyes: Negative for redness.  Respiratory: Positive for cough. Negative for choking, chest tightness and shortness of breath.        Cough, thick clear sputum  Cardiovascular: Negative for chest pain and palpitations.  Gastrointestinal: Positive for constipation. Negative for abdominal pain, blood in stool, diarrhea, nausea and vomiting.  Genitourinary: Negative for dysuria, frequency and urgency.  Musculoskeletal: Positive for gait problem.       Ambulates with walker.   Skin: Negative for wound.  Neurological: Negative for speech difficulty and headaches.  Psychiatric/Behavioral: Positive for confusion.    Immunization History  Administered Date(s) Administered  . Influenza, High Dose Seasonal PF 12/27/2013  . Influenza-Unspecified 01/12/2013, 01/02/2015, 01/29/2016  . Pneumococcal-Unspecified 07/01/2012  . Td 05/27/2004  . Tdap  02/21/2012  . Zoster 04/15/2007   Pertinent  Health Maintenance Due  Topic Date Due  . INFLUENZA VACCINE  11/12/2016  . PNA vac Low Risk Adult (2 of 2 - PCV13) 04/14/2017 (Originally 07/01/2013)  . DEXA SCAN  Completed   Fall Risk  05/22/2014 10/06/2013 06/02/2013  Falls in the past year? No No No  Risk for fall due to : - History of fall(s);Impaired balance/gait;Impaired mobility Impaired mobility   Functional Status Survey:    Vitals:   11/17/16 1200  BP: (!) 154/68  Pulse: 68  Resp: 20  Temp: 99 F (37.2 C)  SpO2: 97%  Weight: 136 lb 1.6 oz (61.7 kg)   Body mass index is 25.72 kg/m. Physical Exam  Constitutional: She appears well-developed and well-nourished.  HENT:  Head: Normocephalic and atraumatic.  Mouth/Throat: Oropharynx is clear and moist.  Hearing aids present  Eyes: Pupils are equal, round, and reactive to light. Conjunctivae are normal.  Neck: Normal range of motion. Neck supple.  Cardiovascular: Normal rate and regular rhythm.   Pulmonary/Chest: Effort normal and breath sounds normal. No respiratory distress. She has no wheezes. She has no rales. She exhibits no tenderness.  Abdominal: Soft. Bowel sounds are normal. She exhibits no distension. There is no tenderness.  Musculoskeletal: Normal range of motion.  Ambulates with walker.   Neurological: She is alert.  dementia  Skin: Skin is warm and dry. No rash noted.  Psychiatric: She has a normal mood and affect.    Labs reviewed:  Recent Labs  07/15/16 08/19/16 09/16/16  NA 143 141 142  K 4.5 3.5 3.6  BUN 17 22* 23*  CREATININE 0.8 0.8 0.7    Recent Labs  11/27/15 01/22/16  AST 12* 18  ALT 7 15  ALKPHOS 57 121    Recent Labs  03/18/16 07/15/16 09/16/16  WBC 8.1 7.4 8.3  HGB 11.4* 12.1 12.5  HCT 35* 36 37  PLT 225 265 248   Lab Results  Component Value Date   TSH 1.74 07/15/2016   Lab Results  Component Value Date   HGBA1C 5.8 (H) 07/06/2014   Lab Results  Component Value Date    CHOL 228 (A) 07/15/2016   HDL 55 07/15/2016   LDLCALC 146 07/15/2016   TRIG 141 07/15/2016   CHOLHDL 3.2 07/06/2014    Significant Diagnostic Results in last 30 days:   11/14/16 CXR patchy interstitial changes are present right lower lung suspicious of PNA, mild to moderate carediomegaly with moderate elevation of the pulmonary vasculature suggesting mild congestive heart failure.    Assessment/Plan HCAP (healthcare-associated pneumonia) 11/14/16 CXR patchy interstitial changes  are present right lower lung suspicious of PNA, mild to moderate carediomegaly with moderate elevation of the pulmonary vasculature suggesting mild congestive heart failure. 10 day course Avelox 400mg  po qd started 11/14/16, tolerated. She is afebrile. Respiratory symptoms are improving.     Chronic diastolic heart failure ([facility]) 11/14/16 CXR patchy interstitial changes are present right lower lung suspicious of PNA, mild to moderate carediomegaly with moderate elevation of the pulmonary vasculature suggesting mild congestive heart failure. 10 day course Avelox 400mg  po qd started 11/14/16, tolerated. She is afebrile. Respiratory symptoms are improving.  Furosemide 20mg  po qd, Kcl 29meq po qd x 3 days, then update BMP BNP  Pleural effusion Exudative left pleural effusion-status post removal of 850 ml of pleural fluid Inflammation pleural fluid culture and cytology 04/16/15 CXR left basilar opacity likely representing infectious process, chronic chest findings are present. 07/07/15 CXR cardiomegaly, no acute pulmonary pathology. 08/21/15 CXR no acute cardiopulmonary  11/14/16 CXR patchy interstitial changes are present right lower lung suspicious of PNA, mild to moderate carediomegaly with moderate elevation of the pulmonary vasculature suggesting mild congestive heart failure.      Family/ staff Communication: plan of care reviewed with the patient and charge nurse  Labs/tests ordered: BMP BNP  Time spend: 25 minutes.

## 2016-11-17 NOTE — Assessment & Plan Note (Signed)
11/14/16 CXR patchy interstitial changes are present right lower lung suspicious of PNA, mild to moderate carediomegaly with moderate elevation of the pulmonary vasculature suggesting mild congestive heart failure. 10 day course Avelox 400mg  po qd started 11/14/16, tolerated. She is afebrile. Respiratory symptoms are improving.

## 2016-11-18 DIAGNOSIS — I10 Essential (primary) hypertension: Secondary | ICD-10-CM | POA: Diagnosis not present

## 2016-11-18 DIAGNOSIS — I5032 Chronic diastolic (congestive) heart failure: Secondary | ICD-10-CM | POA: Diagnosis not present

## 2016-11-18 DIAGNOSIS — D649 Anemia, unspecified: Secondary | ICD-10-CM | POA: Diagnosis not present

## 2016-11-21 DIAGNOSIS — I10 Essential (primary) hypertension: Secondary | ICD-10-CM | POA: Diagnosis not present

## 2016-11-21 DIAGNOSIS — I5032 Chronic diastolic (congestive) heart failure: Secondary | ICD-10-CM | POA: Diagnosis not present

## 2016-11-21 LAB — BASIC METABOLIC PANEL
BUN: 21 (ref 4–21)
Creatinine: 0.8 (ref <=1.1)
Glucose: 90
Potassium: 4.1 (ref 3.4–5.3)
Sodium: 142 (ref 137–147)

## 2016-11-24 ENCOUNTER — Non-Acute Institutional Stay (SKILLED_NURSING_FACILITY): Payer: Medicare Other | Admitting: Nurse Practitioner

## 2016-11-24 ENCOUNTER — Other Ambulatory Visit: Payer: Self-pay | Admitting: *Deleted

## 2016-11-24 ENCOUNTER — Encounter: Payer: Self-pay | Admitting: Nurse Practitioner

## 2016-11-24 DIAGNOSIS — K219 Gastro-esophageal reflux disease without esophagitis: Secondary | ICD-10-CM | POA: Diagnosis not present

## 2016-11-24 DIAGNOSIS — E039 Hypothyroidism, unspecified: Secondary | ICD-10-CM | POA: Diagnosis not present

## 2016-11-24 DIAGNOSIS — I5032 Chronic diastolic (congestive) heart failure: Secondary | ICD-10-CM

## 2016-11-24 DIAGNOSIS — I10 Essential (primary) hypertension: Secondary | ICD-10-CM

## 2016-11-24 DIAGNOSIS — K5900 Constipation, unspecified: Secondary | ICD-10-CM | POA: Diagnosis not present

## 2016-11-24 DIAGNOSIS — G309 Alzheimer's disease, unspecified: Secondary | ICD-10-CM | POA: Diagnosis not present

## 2016-11-24 DIAGNOSIS — F0280 Dementia in other diseases classified elsewhere without behavioral disturbance: Secondary | ICD-10-CM | POA: Diagnosis not present

## 2016-11-24 NOTE — Assessment & Plan Note (Signed)
Stable, continue Senokot S II nightly, MiraLax qod, Colace prn

## 2016-11-24 NOTE — Assessment & Plan Note (Signed)
05/19/14 MMSE 18/30, continue Namenda, SNF for care needs

## 2016-11-24 NOTE — Assessment & Plan Note (Signed)
Stable, continue Famotidine 20mg daily.   

## 2016-11-24 NOTE — Assessment & Plan Note (Signed)
11/22/16 Na 142, K 4.1, Bun 21, creat 0.81, BNP 196 11/24/16 clinically compensated, had Furosemide 20mg  qd 11/17/16, 11/18/16, 11/19/16 during the onset of pneumonia.

## 2016-11-24 NOTE — Assessment & Plan Note (Signed)
Controlled, continue Labetalol 100mg  bid

## 2016-11-24 NOTE — Assessment & Plan Note (Signed)
TSH 1.74 07/15/16, continue Levothyroxine 55mcg qd

## 2016-11-24 NOTE — Progress Notes (Signed)
Location:  [facility] Room Number: 31 Place of Service:  SNF (31) Provider:  Maryruth Apple, Manxie  NP  Blanchie Serve, MD  Patient Care Team: Blanchie Serve, MD as PCP - General (Internal Medicine) Crystal Petersen Clent Demark, MD as Consulting Physician (Ophthalmology) Burnell Blanks, MD as Consulting Physician (Cardiology) Inda Castle, MD as Consulting Physician (Gastroenterology) Jari Pigg, MD as Consulting Physician (Dermatology) Dorna Leitz, MD as Consulting Physician (Orthopedic Surgery) Johnell Landowski X, NP as Nurse Practitioner (Internal Medicine)  Extended Emergency Contact Information Primary Emergency Contact: [facility] Address: [address]          [place], [place] [address] Johnnette Litter of [place] Phone: [phone] Relation: Daughter Secondary Emergency Contact: [place], Bel[place], [place] [address] Johnnette Litter of [place] Phone: [phone] Relation: Son  Code Status:  DNR Goals of care: Advanced Directive information Advanced Directives 11/24/2016  Does Patient Have a Medical Advance Directive? Yes  Type of Advance Directive Out of facility DNR (pink MOST or yellow form)  Does patient want to make changes to medical advance directive? No - Patient declined  Copy of [place] in Chart? -  Pre-existing out of facility DNR order (yellow form or pink MOST form) Yellow form placed in chart (order not valid for inpatient use)     Chief Complaint  Patient presents with  . Medical Management of Chronic Issues    HPI:  Pt is a 83 y.o. female seen today for medical management of chronic diseases.      Hx of GERD asymptomatic, takes Famotidine 20mg  daily, last TSH 1.74 07/15/16, on Levothyroxine 52mcg, controlled blood pressure while taking Labetalol 100mg  bid, taking Namenda for memory loss, she is functioning adequately in SNF. Her constipation is managed with Senna S II qhs, MirLax qod, Colace prn   11/14/16  right lower lung pneumonia, completed 10 day course of Avelox, the patient stated she feels much better.  04/07/15-04/09/15 hospitalized, A Chest X-ray revealed a Bilateral Pleuarl Effusions, the left, s/p thorocentesis.      Past Medical History:  Diagnosis Date  . Acute blood loss anemia 03/29/2014   03/30/14 Hgb 7.3 04/04/14 Hgb 8.5 continue Fe bid.  05/04/14 Hgb 10.8   . Benign neoplasm of colon   . Cancer ([facility])    pre-melanoma  . Cystocele   . Dementia of the Alzheimer's type 05/22/2014   05/19/14 MMSE 18/30 Namenda.    . Diastolic dysfunction 04/22/3233  . Diverticulosis of colon (without mention of hemorrhage)   . Diverticulosis of large intestine 06/28/2004  . Esophageal reflux   . Essential hypertension 07/05/2013  . Gastroparesis   . Hemorrhoids   . HTN (hypertension)   . Hyperlipidemia 07/05/2013  . Hypertension   . Hypothyroidism   . Internal hemorrhoids without mention of complication 57/06/2200  . Macular degeneration   . Nonspecific abnormal electrocardiogram (ECG) (EKG)   . Osteoarthritis    right hip  . Other and unspecified hyperlipidemia   . Prediabetes 11/17/2013  . Primary osteoarthritis of right hip 03/27/2014  . Shingles   . Skin cancer (melanoma) ([facility])   . Stricture and stenosis of esophagus   . Stricture and stenosis of esophagus 10/27/2007  . Vitamin D deficiency 11/17/2013   Past Surgical History:  Procedure Laterality Date  . APPENDECTOMY    . BCC resection     legs/face  . CATARACT EXTRACTION, BILATERAL    . CHOLECYSTECTOMY    .  FLEXIBLE SIGMOIDOSCOPY  05/12/2011   Procedure: FLEXIBLE SIGMOIDOSCOPY;  Surgeon: Inda Castle, MD;  Location: [facility];  Service: Endoscopy;  Laterality: N/A;  . FLEXIBLE SIGMOIDOSCOPY  09/10/2011   Procedure: FLEXIBLE SIGMOIDOSCOPY;  Surgeon: Inda Castle, MD;  Location: [facility];  Service: Endoscopy;  Laterality: N/A;  . FLEXIBLE SIGMOIDOSCOPY N/A 08/16/2012   Procedure: FLEXIBLE SIGMOIDOSCOPY;   Surgeon: Inda Castle, MD;  Location: [facility];  Service: Endoscopy;  Laterality: N/A;  . FRACTURE SURGERY     left hip pin  . GALLBLADDER SURGERY  2002  . HEMORRHOID BANDING N/A 08/16/2012   Procedure: HEMORRHOID BANDING;  Surgeon: Inda Castle, MD;  Location: [facility];  Service: Endoscopy;  Laterality: N/A;  . HEMORRHOID SURGERY    . left eye surgery    . melenoma resection     right arm  . THYROIDECTOMY    . TONSILLECTOMY    . TOTAL HIP ARTHROPLASTY Right 03/27/2014   Procedure: TOTAL HIP ARTHROPLASTY ANTERIOR APPROACH;  Surgeon: Alta Corning, MD;  Location: [facility];  Service: Orthopedics;  Laterality: Right;    Allergies  Allergen Reactions  . Ace Inhibitors Other (See Comments)    Elevated potassium  . Statins Other (See Comments)    Myalgias   . Vicodin [Hydrocodone-Acetaminophen] Other (See Comments)    "felt spaced out"  . Fosamax [Alendronate Sodium] Other (See Comments)  . Metoclopramide Hcl Other (See Comments)  . Sulfa Antibiotics Rash  . Sulfonamide Derivatives Rash  . Vesicare [Solifenacin] Other (See Comments)    Outpatient Encounter Prescriptions as of 11/24/2016  Medication Sig  . acetaminophen (TYLENOL) 500 MG tablet Take 1,000 mg by mouth 3 (three) times daily as needed for mild pain or moderate pain.  Marland Kitchen aspirin EC 81 MG tablet Take 81 mg by mouth daily.  . calcium carbonate (CAL-GEST ANTACID) 500 MG chewable tablet Chew 1 tablet by mouth daily as needed for indigestion or heartburn.  . Cholecalciferol (VITAMIN D) 2000 UNITS tablet Take 4,000 Units by mouth daily.  Marland Kitchen docusate sodium (COLACE) 100 MG capsule Take 100 mg by mouth 2 (two) times daily as needed for mild constipation.   . famotidine (PEPCID) 20 MG tablet Take 20 mg by mouth at bedtime.  Marland Kitchen guaiFENesin (ROBITUSSIN) 100 MG/5ML SOLN Take 10 mLs by mouth every 6 (six) hours as needed for cough or to loosen phlegm.  . labetalol (NORMODYNE) 100 MG tablet Take 100 mg by mouth 2 (two) times  daily.  Marland Kitchen levothyroxine (SYNTHROID, LEVOTHROID) 75 MCG tablet Take 75 mcg by mouth daily before breakfast.  . memantine (NAMENDA XR) 28 MG CP24 24 hr capsule Take 28 mg by mouth daily.  . Multiple Vitamins-Minerals (PRESERVISION AREDS 2) CAPS Take 1 capsule by mouth 2 (two) times daily.  Marland Kitchen nystatin-triamcinolone (MYCOLOG II) cream Apply 1 application topically. Apply to affected area once a day  . polyethylene glycol (MIRALAX / GLYCOLAX) packet Take 17 g by mouth every other day.   . protein supplement (RESOURCE BENEPROTEIN) POWD Take 1 scoop by mouth 2 (two) times daily.   Marland Kitchen saccharomyces boulardii (FLORASTOR) 250 MG capsule Take 250 mg by mouth 2 (two) times daily.  . sennosides-docusate sodium (SENOKOT-S) 8.6-50 MG tablet Take 2 tablets by mouth at bedtime.    No facility-administered encounter medications on file as of 11/24/2016.     Review of Systems  Constitutional: Negative for activity change, appetite change, chills, diaphoresis, fatigue and fever.  HENT: Positive for hearing loss. Negative for congestion,  trouble swallowing and voice change.        Has hearing aids  Eyes: Negative for visual disturbance.  Respiratory: Negative for cough, choking, chest tightness and shortness of breath.        Cough, thick clear sputum  Cardiovascular: Negative for chest pain and palpitations.  Gastrointestinal: Negative for abdominal distention, abdominal pain and constipation.  Genitourinary: Negative for dysuria.  Musculoskeletal: Positive for gait problem.       Ambulates with walker.   Skin: Negative for rash.  Neurological: Negative for syncope and headaches.  Psychiatric/Behavioral: Positive for confusion.    Immunization History  Administered Date(s) Administered  . Influenza, High Dose Seasonal PF 12/27/2013  . Influenza-Unspecified 01/12/2013, 01/02/2015, 01/29/2016  . Pneumococcal-Unspecified 07/01/2012  . Td 05/27/2004  . Tdap 02/21/2012  . Zoster 04/15/2007   Pertinent   Health Maintenance Due  Topic Date Due  . INFLUENZA VACCINE  11/12/2016  . PNA vac Low Risk Adult (2 of 2 - PCV13) 04/14/2017 (Originally 07/01/2013)  . DEXA SCAN  Completed   Fall Risk  05/22/2014 10/06/2013 06/02/2013  Falls in the past year? No No No  Risk for fall due to : - History of fall(s);Impaired balance/gait;Impaired mobility Impaired mobility   Functional Status Survey:    Vitals:   11/24/16 1234  BP: 130/70  Pulse: 72  Resp: 16  Temp: 98.2 F (36.8 C)  SpO2: 98%  Weight: 136 lb 1.6 oz (61.7 kg)  Height: 5\' 1"  (1.549 m)   Body mass index is 25.72 kg/m. Physical Exam  Constitutional: She appears well-developed and well-nourished. No distress.  HENT:  Head: Normocephalic and atraumatic.  Mouth/Throat: Oropharynx is clear and moist.  Hearing aids present  Eyes: Pupils are equal, round, and reactive to light. Conjunctivae are normal.  Neck: Normal range of motion. Neck supple.  Cardiovascular: Normal rate and regular rhythm.   Pulmonary/Chest: Effort normal and breath sounds normal. No respiratory distress. She has no wheezes. She has no rales. She exhibits no tenderness.  Abdominal: Soft. Bowel sounds are normal. She exhibits no distension. There is no tenderness.  Musculoskeletal:  Ambulates with walker.   Neurological: She is alert.  Dementia, oriented to self.   Skin: Skin is warm and dry. No rash noted. She is not diaphoretic.  Psychiatric: She has a normal mood and affect.    Labs reviewed:  Recent Labs  08/19/16 09/16/16 11/21/16  NA 141 142 142  K 3.5 3.6 4.1  BUN 22* 23* 21  CREATININE 0.8 0.7 0.8    Recent Labs  11/27/15 01/22/16  AST 12* 18  ALT 7 15  ALKPHOS 57 121    Recent Labs  03/18/16 07/15/16 09/16/16  WBC 8.1 7.4 8.3  HGB 11.4* 12.1 12.5  HCT 35* 36 37  PLT 225 265 248   Lab Results  Component Value Date   TSH 1.74 07/15/2016   Lab Results  Component Value Date   HGBA1C 5.8 (H) 07/06/2014   Lab Results  Component  Value Date   CHOL 228 (A) 07/15/2016   HDL 55 07/15/2016   LDLCALC 146 07/15/2016   TRIG 141 07/15/2016   CHOLHDL 3.2 07/06/2014    Significant Diagnostic Results in last 30 days:  No results found.  Assessment/Plan GERD Stable, continue Famotidine 20mg  daily.  Hypothyroidism TSH 1.74 07/15/16, continue Levothyroxine 48mcg qd  Essential hypertension Controlled, continue Labetalol 100mg  bid  Chronic diastolic heart failure ([facility]) 11/22/16 Na 142, K 4.1, Bun 21, creat 0.81, BNP 196 11/24/16  clinically compensated, had Furosemide 20mg  qd 11/17/16, 11/18/16, 11/19/16 during the onset of pneumonia.   Constipation Stable, continue Senokot S II nightly, MiraLax qod, Colace prn  Dementia of the Alzheimer's type 05/19/14 MMSE 18/30, continue Namenda, SNF for care needs     Family/ staff Communication: plan of care reviewed with the patient and charge nurse  Labs/tests ordered:  none  Time spend 25 minutes

## 2016-11-28 ENCOUNTER — Non-Acute Institutional Stay (SKILLED_NURSING_FACILITY): Payer: Medicare Other

## 2016-11-28 DIAGNOSIS — Z0000 Encounter for general adult medical examination without abnormal findings: Secondary | ICD-10-CM

## 2016-11-28 NOTE — Patient Instructions (Signed)
[name] , Thank you for taking time to come for your Medicare Wellness Visit. I appreciate your ongoing commitment to your health goals. Please review the following plan we discussed and let me know if I can assist you in the future.   Screening recommendations/referrals: Colonoscopy excluded, pt over age 81 Mammogram excluded, pt over age 29 Bone Density up to date Recommended yearly ophthalmology/optometry visit for glaucoma screening and checkup Recommended yearly dental visit for hygiene and checkup  Vaccinations: Influenza vaccine due 2018 fall season Pneumococcal vaccine 13 due Tdap vaccine up to date. Due 02/20/22 Shingles vaccine not in records  Advanced directives: DNR in chart, copies of health care power of attorney and living will are needed   Conditions/risks identified: None  Next appointment: Dr. Bubba Camp makes rounds   Preventive Care 50 Years and Older, Female Preventive care refers to lifestyle choices and visits with your health care provider that can promote health and wellness. What does preventive care include?  A yearly physical exam. This is also called an annual well check.  Dental exams once or twice a year.  Routine eye exams. Ask your health care provider how often you should have your eyes checked.  Personal lifestyle choices, including:  Daily care of your teeth and gums.  Regular physical activity.  Eating a healthy diet.  Avoiding tobacco and drug use.  Limiting alcohol use.  Practicing safe sex.  Taking low-dose aspirin every day.  Taking vitamin and mineral supplements as recommended by your health care provider. What happens during an annual well check? The services and screenings done by your health care provider during your annual well check will depend on your age, overall health, lifestyle risk factors, and family history of disease. Counseling  Your health care provider may ask you questions about your:  Alcohol  use.  Tobacco use.  Drug use.  Emotional well-being.  Home and relationship well-being.  Sexual activity.  Eating habits.  History of falls.  Memory and ability to understand (cognition).  Work and work Statistician.  Reproductive health. Screening  You may have the following tests or measurements:  Height, weight, and BMI.  Blood pressure.  Lipid and cholesterol levels. These may be checked every 5 years, or more frequently if you are over 33 years old.  Skin check.  Lung cancer screening. You may have this screening every year starting at age 2 if you have a 30-pack-year history of smoking and currently smoke or have quit within the past 15 years.  Fecal occult blood test (FOBT) of the stool. You may have this test every year starting at age 40.  Flexible sigmoidoscopy or colonoscopy. You may have a sigmoidoscopy every 5 years or a colonoscopy every 10 years starting at age 71.  Hepatitis C blood test.  Hepatitis B blood test.  Sexually transmitted disease (STD) testing.  Diabetes screening. This is done by checking your blood sugar (glucose) after you have not eaten for a while (fasting). You may have this done every 1-3 years.  Bone density scan. This is done to screen for osteoporosis. You may have this done starting at age 54.  Mammogram. This may be done every 1-2 years. Talk to your health care provider about how often you should have regular mammograms. Talk with your health care provider about your test results, treatment options, and if necessary, the need for more tests. Vaccines  Your health care provider may recommend certain vaccines, such as:  Influenza vaccine. This is recommended every  year.  Tetanus, diphtheria, and acellular pertussis (Tdap, Td) vaccine. You may need a Td booster every 10 years.  Zoster vaccine. You may need this after age 61.  Pneumococcal 13-valent conjugate (PCV13) vaccine. One dose is recommended after age  11.  Pneumococcal polysaccharide (PPSV23) vaccine. One dose is recommended after age 51. Talk to your health care provider about which screenings and vaccines you need and how often you need them. This information is not intended to replace advice given to you by your health care provider. Make sure you discuss any questions you have with your health care provider. Document Released: 04/27/2015 Document Revised: 12/19/2015 Document Reviewed: 01/30/2015 Elsevier Interactive Patient Education  2017 [place] Prevention in the Home Falls can cause injuries. They can happen to people of all ages. There are many things you can do to make your home safe and to help prevent falls. What can I do on the outside of my home?  Regularly fix the edges of walkways and driveways and fix any cracks.  Remove anything that might make you trip as you walk through a door, such as a raised step or threshold.  Trim any bushes or trees on the path to your home.  Use bright outdoor lighting.  Clear any walking paths of anything that might make someone trip, such as rocks or tools.  Regularly check to see if handrails are loose or broken. Make sure that both sides of any steps have handrails.  Any raised decks and porches should have guardrails on the edges.  Have any leaves, snow, or ice cleared regularly.  Use sand or salt on walking paths during winter.  Clean up any spills in your garage right away. This includes oil or grease spills. What can I do in the bathroom?  Use night lights.  Install grab bars by the toilet and in the tub and shower. Do not use towel bars as grab bars.  Use non-skid mats or decals in the tub or shower.  If you need to sit down in the shower, use a plastic, non-slip stool.  Keep the floor dry. Clean up any water that spills on the floor as soon as it happens.  Remove soap buildup in the tub or shower regularly.  Attach bath mats securely with double-sided  non-slip rug tape.  Do not have throw rugs and other things on the floor that can make you trip. What can I do in the bedroom?  Use night lights.  Make sure that you have a light by your bed that is easy to reach.  Do not use any sheets or blankets that are too big for your bed. They should not hang down onto the floor.  Have a firm chair that has side arms. You can use this for support while you get dressed.  Do not have throw rugs and other things on the floor that can make you trip. What can I do in the kitchen?  Clean up any spills right away.  Avoid walking on wet floors.  Keep items that you use a lot in easy-to-reach places.  If you need to reach something above you, use a strong step stool that has a grab bar.  Keep electrical cords out of the way.  Do not use floor polish or wax that makes floors slippery. If you must use wax, use non-skid floor wax.  Do not have throw rugs and other things on the floor that can make you trip. What can  I do with my stairs?  Do not leave any items on the stairs.  Make sure that there are handrails on both sides of the stairs and use them. Fix handrails that are broken or loose. Make sure that handrails are as long as the stairways.  Check any carpeting to make sure that it is firmly attached to the stairs. Fix any carpet that is loose or worn.  Avoid having throw rugs at the top or bottom of the stairs. If you do have throw rugs, attach them to the floor with carpet tape.  Make sure that you have a light switch at the top of the stairs and the bottom of the stairs. If you do not have them, ask someone to add them for you. What else can I do to help prevent falls?  Wear shoes that:  Do not have high heels.  Have rubber bottoms.  Are comfortable and fit you well.  Are closed at the toe. Do not wear sandals.  If you use a stepladder:  Make sure that it is fully opened. Do not climb a closed stepladder.  Make sure that both  sides of the stepladder are locked into place.  Ask someone to hold it for you, if possible.  Clearly mark and make sure that you can see:  Any grab bars or handrails.  First and last steps.  Where the edge of each step is.  Use tools that help you move around (mobility aids) if they are needed. These include:  Canes.  Walkers.  Scooters.  Crutches.  Turn on the lights when you go into a dark area. Replace any light bulbs as soon as they burn out.  Set up your furniture so you have a clear path. Avoid moving your furniture around.  If any of your floors are uneven, fix them.  If there are any pets around you, be aware of where they are.  Review your medicines with your doctor. Some medicines can make you feel dizzy. This can increase your chance of falling. Ask your doctor what other things that you can do to help prevent falls. This information is not intended to replace advice given to you by your health care provider. Make sure you discuss any questions you have with your health care provider. Document Released: 01/25/2009 Document Revised: 09/06/2015 Document Reviewed: 05/05/2014 Elsevier Interactive Patient Education  2017 [employer].

## 2016-11-28 NOTE — Progress Notes (Signed)
Subjective:   Crystal Petersen is a 83 y.o. female who presents for Medicare Annual (Subsequent) preventive examination at [facility].  Last AWV-10/06/13    Objective:     Vitals: BP 110/66 (BP Location: Right Arm, Patient Position: Sitting)   Pulse 84   Temp (!) 97.4 F (36.3 C) (Oral)   Ht 5\' 1"  (1.549 m)   Wt 136 lb (61.7 kg)   SpO2 96%   BMI 25.70 kg/m   Body mass index is 25.7 kg/m.   Tobacco History  Smoking Status  . Never Smoker  Smokeless Tobacco  . Never Used     Counseling given: Not Answered   Past Medical History:  Diagnosis Date  . Acute blood loss anemia 03/29/2014   03/30/14 Hgb 7.3 04/04/14 Hgb 8.5 continue Fe bid.  05/04/14 Hgb 10.8   . Benign neoplasm of colon   . Cancer ([place])    pre-melanoma  . Cystocele   . Dementia of the Alzheimer's type 05/22/2014   05/19/14 MMSE 18/30 [place].    . Diastolic dysfunction 8/56/3149  . Diverticulosis of colon (without mention of hemorrhage)   . Diverticulosis of large intestine 06/28/2004  . Esophageal reflux   . Essential hypertension 07/05/2013  . Gastroparesis   . Hemorrhoids   . HTN (hypertension)   . Hyperlipidemia 07/05/2013  . Hypertension   . Hypothyroidism   . Internal hemorrhoids without mention of complication 70/05/6376  . Macular degeneration   . Nonspecific abnormal electrocardiogram (ECG) (EKG)   . Osteoarthritis    right hip  . Other and unspecified hyperlipidemia   . Prediabetes 11/17/2013  . Primary osteoarthritis of right hip 03/27/2014  . Shingles   . Skin cancer (melanoma) ([place])   . Stricture and stenosis of esophagus   . Stricture and stenosis of esophagus 10/27/2007  . Vitamin D deficiency 11/17/2013   Past Surgical History:  Procedure Laterality Date  . APPENDECTOMY    . BCC resection     legs/face  . CATARACT EXTRACTION, BILATERAL    . CHOLECYSTECTOMY    . FLEXIBLE SIGMOIDOSCOPY  05/12/2011   Procedure: FLEXIBLE SIGMOIDOSCOPY;  Surgeon: Inda Castle, MD;   Location: [facility];  Service: Endoscopy;  Laterality: N/A;  . FLEXIBLE SIGMOIDOSCOPY  09/10/2011   Procedure: FLEXIBLE SIGMOIDOSCOPY;  Surgeon: Inda Castle, MD;  Location: [facility];  Service: Endoscopy;  Laterality: N/A;  . FLEXIBLE SIGMOIDOSCOPY N/A 08/16/2012   Procedure: FLEXIBLE SIGMOIDOSCOPY;  Surgeon: Inda Castle, MD;  Location: [facility];  Service: Endoscopy;  Laterality: N/A;  . FRACTURE SURGERY     left hip pin  . GALLBLADDER SURGERY  2002  . HEMORRHOID BANDING N/A 08/16/2012   Procedure: HEMORRHOID BANDING;  Surgeon: Inda Castle, MD;  Location: [facility];  Service: Endoscopy;  Laterality: N/A;  . HEMORRHOID SURGERY    . left eye surgery    . melenoma resection     right arm  . THYROIDECTOMY    . TONSILLECTOMY    . TOTAL HIP ARTHROPLASTY Right 03/27/2014   Procedure: TOTAL HIP ARTHROPLASTY ANTERIOR APPROACH;  Surgeon: Alta Corning, MD;  Location: [facility];  Service: Orthopedics;  Laterality: Right;   Family History  Problem Relation Age of Onset  . Heart disease Father   . Hypertension Father   . Heart attack Father   . Esophageal cancer Brother   . Diabetes Daughter   . Colon cancer Neg Hx   . Malignant hyperthermia Neg Hx    History  Sexual Activity  . Sexual activity: No    Outpatient Encounter Prescriptions as of 11/28/2016  Medication Sig  . acetaminophen (TYLENOL) 500 MG tablet Take 1,000 mg by mouth 3 (three) times daily as needed for mild pain or moderate pain.  Marland Kitchen aspirin EC 81 MG tablet Take 81 mg by mouth daily.  . calcium carbonate (CAL-GEST ANTACID) 500 MG chewable tablet Chew 1 tablet by mouth daily as needed for indigestion or heartburn.  . Cholecalciferol (VITAMIN D) 2000 UNITS tablet Take 4,000 Units by mouth daily.  Marland Kitchen docusate sodium (COLACE) 100 MG capsule Take 100 mg by mouth 2 (two) times daily as needed for mild constipation.   . famotidine (PEPCID) 20 MG tablet Take 20 mg by mouth at bedtime.  Marland Kitchen guaiFENesin (ROBITUSSIN) 100  MG/5ML SOLN Take 10 mLs by mouth every 6 (six) hours as needed for cough or to loosen phlegm.  . labetalol (NORMODYNE) 100 MG tablet Take 100 mg by mouth 2 (two) times daily.  Marland Kitchen levothyroxine (SYNTHROID, LEVOTHROID) 75 MCG tablet Take 75 mcg by mouth daily before breakfast.  . memantine (NAMENDA XR) 28 MG CP24 24 hr capsule Take 28 mg by mouth daily.  . Multiple Vitamins-Minerals (PRESERVISION AREDS 2) CAPS Take 1 capsule by mouth 2 (two) times daily.  Marland Kitchen nystatin-triamcinolone (MYCOLOG II) cream Apply 1 application topically. Apply to affected area once a day  . polyethylene glycol (MIRALAX / GLYCOLAX) packet Take 17 g by mouth every other day.   . protein supplement (RESOURCE BENEPROTEIN) POWD Take 1 scoop by mouth 2 (two) times daily.   Marland Kitchen saccharomyces boulardii (FLORASTOR) 250 MG capsule Take 250 mg by mouth 2 (two) times daily.  . sennosides-docusate sodium (SENOKOT-S) 8.6-50 MG tablet Take 2 tablets by mouth at bedtime.    No facility-administered encounter medications on file as of 11/28/2016.     Activities of Daily Living In your present state of health, do you have any difficulty performing the following activities: 11/28/2016  Hearing? Y  Vision? Y  Difficulty concentrating or making decisions? Y  Walking or climbing stairs? Y  Dressing or bathing? Y  Doing errands, shopping? Y  Preparing Food and eating ? Y  Using the Toilet? N  In the past six months, have you accidently leaked urine? N  Do you have problems with loss of bowel control? N  Managing your Medications? Y  Managing your Finances? Y  Housekeeping or managing your Housekeeping? Y  Some recent data might be hidden    Patient Care Team: Blanchie Serve, MD as PCP - General (Internal Medicine) Zadie Rhine Clent Demark, MD as Consulting Physician (Ophthalmology) Burnell Blanks, MD as Consulting Physician (Cardiology) Inda Castle, MD as Consulting Physician (Gastroenterology) Jari Pigg, MD as Consulting  Physician (Dermatology) Dorna Leitz, MD as Consulting Physician (Orthopedic Surgery) Mast, Man X, NP as Nurse Practitioner (Internal Medicine)    Assessment:     Exercise Activities and Dietary recommendations Current Exercise Habits: The patient does not participate in regular exercise at present, Exercise limited by: orthopedic condition(s)  Goals    None     Fall Risk Fall Risk  11/28/2016 05/22/2014 10/06/2013 06/02/2013  Falls in the past year? No No No No  Risk for fall due to : - - History of fall(s);Impaired balance/gait;Impaired mobility Impaired mobility   Depression Screen PHQ 2/9 Scores 11/28/2016 05/22/2014 10/06/2013 06/02/2013  PHQ - 2 Score 0 0 0 0     Cognitive Function MMSE - Mini Mental State Exam 11/28/2016  Orientation to time 0  Orientation to Place 2  Registration 3  Attention/ Calculation 5  Recall 0  Language- name 2 objects 2  Language- repeat 1  Language- follow 3 step command 3  Language- read & follow direction 1  Write a sentence 1  Copy design 0  Total score 18        Immunization History  Administered Date(s) Administered  . Influenza, High Dose Seasonal PF 12/27/2013  . Influenza-Unspecified 01/12/2013, 01/02/2015, 01/29/2016  . Pneumococcal-Unspecified 07/01/2012  . Td 05/27/2004  . Tdap 02/21/2012  . Zoster 04/15/2007   Screening Tests Health Maintenance  Topic Date Due  . INFLUENZA VACCINE  11/12/2016  . PNA vac Low Risk Adult (2 of 2 - PCV13) 04/14/2017 (Originally 07/01/2013)  . TETANUS/TDAP  02/20/2022  . DEXA SCAN  Completed      Plan:   .I have personally reviewed and addressed the Medicare Annual Wellness questionnaire and have noted the following in the patient's chart:  A. Medical and social history B. Use of alcohol, tobacco or illicit drugs  C. Current medications and supplements D. Functional ability and status E.  Nutritional status F.  Physical activity G. Advance directives H. List of other physicians I.    Hospitalizations, surgeries, and ER visits in previous 12 months J.  [place] to include hearing, vision, cognitive, depression L. Referrals and appointments - none  In addition, I have reviewed and discussed with patient certain preventive protocols, quality metrics, and best practice recommendations. A written personalized care plan for preventive services as well as general preventive health recommendations were provided to patient.  See attached scanned questionnaire for additional information.   Signed,   Rich Reining, RN Nurse Health Advisor   Quick Notes   Health Maintenance: PNA 13 due     Abnormal Screen: MMSE 18/30. Passed clock drawing     Patient Concerns: None     Nurse Concerns: None

## 2016-12-04 ENCOUNTER — Non-Acute Institutional Stay (SKILLED_NURSING_FACILITY): Payer: Medicare Other | Admitting: Internal Medicine

## 2016-12-04 ENCOUNTER — Encounter: Payer: Self-pay | Admitting: Internal Medicine

## 2016-12-04 DIAGNOSIS — K2100 Gastro-esophageal reflux disease with esophagitis, without bleeding: Secondary | ICD-10-CM

## 2016-12-04 DIAGNOSIS — R05 Cough: Secondary | ICD-10-CM | POA: Diagnosis not present

## 2016-12-04 DIAGNOSIS — R059 Cough, unspecified: Secondary | ICD-10-CM

## 2016-12-04 NOTE — Progress Notes (Signed)
Location:  [facility] Room Number: 72 Place of Service:  SNF [phone]) Provider:  Blanchie Serve MD  Blanchie Serve, MD  Patient Care Team: Blanchie Serve, MD as PCP - General (Internal Medicine) Crystal Petersen Clent Demark, MD as Consulting Physician (Ophthalmology) Burnell Blanks, MD as Consulting Physician (Cardiology) Inda Castle, MD as Consulting Physician (Gastroenterology) Jari Pigg, MD as Consulting Physician (Dermatology) Dorna Leitz, MD as Consulting Physician (Orthopedic Surgery) Mast, Man X, NP as Nurse Practitioner (Internal Medicine)  Extended Emergency Contact Information Primary Emergency Contact: [facility] Address: [address]          [place], [place] [address] Johnnette Litter of [place] Phone: [phone] Relation: Daughter Secondary Emergency Contact: [place], [place], [place] [address] Johnnette Litter of [place] Phone: [phone] Relation: Son  Code Status:  DNR Goals of care: Advanced Directive information Advanced Directives 11/28/2016  Does Patient Have a Medical Advance Directive? Yes  Type of Advance Directive Out of facility DNR (pink MOST or yellow form)  Does patient want to make changes to medical advance directive? No - Patient declined  Copy of [place] in Chart? -  Pre-existing out of facility DNR order (yellow form or pink MOST form) Yellow form placed in chart (order not valid for inpatient use);Pink MOST form placed in chart (order not valid for inpatient use)     Chief Complaint  Patient presents with  . Acute Visit    Cough and shortness of breath    HPI:  Pt is a 83 y.o. female seen today for acute visit. She has been having cough with meals. There was concern for aspiration and SLP was consulted. No aspiration noted on their evaluation. Per family and nursing, patient is noted to cough up some gastric fluid with undigested food particle. She was recently treated with  antibiotic for UTI. She is seen in her room today. She mentions having cough and need to clear her throat at times. Denies any nausea or vomiting. Denies abdominal pain. Denies heartburn and difficulty swallowing.   Past Medical History:  Diagnosis Date  . Acute blood loss anemia 03/29/2014   03/30/14 Hgb 7.3 04/04/14 Hgb 8.5 continue Fe bid.  05/04/14 Hgb 10.8   . Benign neoplasm of colon   . Cancer ([place])    pre-melanoma  . Cystocele   . Dementia of the Alzheimer's type 05/22/2014   05/19/14 MMSE 18/30 Namenda.    . Diastolic dysfunction 1/95/0932  . Diverticulosis of colon (without mention of hemorrhage)   . Diverticulosis of large intestine 06/28/2004  . Esophageal reflux   . Essential hypertension 07/05/2013  . Gastroparesis   . Hemorrhoids   . HTN (hypertension)   . Hyperlipidemia 07/05/2013  . Hypertension   . Hypothyroidism   . Internal hemorrhoids without mention of complication 67/04/2456  . Macular degeneration   . Nonspecific abnormal electrocardiogram (ECG) (EKG)   . Osteoarthritis    right hip  . Other and unspecified hyperlipidemia   . Prediabetes 11/17/2013  . Primary osteoarthritis of right hip 03/27/2014  . Shingles   . Skin cancer (melanoma) ([place])   . Stricture and stenosis of esophagus   . Stricture and stenosis of esophagus 10/27/2007  . Vitamin D deficiency 11/17/2013   Past Surgical History:  Procedure Laterality Date  . APPENDECTOMY    . BCC resection     legs/face  . CATARACT EXTRACTION, BILATERAL    . CHOLECYSTECTOMY    .  FLEXIBLE SIGMOIDOSCOPY  05/12/2011   Procedure: FLEXIBLE SIGMOIDOSCOPY;  Surgeon: Inda Castle, MD;  Location: [facility];  Service: Endoscopy;  Laterality: N/A;  . FLEXIBLE SIGMOIDOSCOPY  09/10/2011   Procedure: FLEXIBLE SIGMOIDOSCOPY;  Surgeon: Inda Castle, MD;  [facility];  Service: Endoscopy;  Laterality: N/A;  . FLEXIBLE SIGMOIDOSCOPY N/A 08/16/2012   Procedure: FLEXIBLE SIGMOIDOSCOPY;  Surgeon: Inda Castle, MD;   [facility];  Service: Endoscopy;  Laterality: N/A;  . FRACTURE SURGERY     left hip pin  . GALLBLADDER SURGERY  2002  . HEMORRHOID BANDING N/A 08/16/2012   Procedure: HEMORRHOID BANDING;  Surgeon: Inda Castle, MD;  [facility];  Service: Endoscopy;  Laterality: N/A;  . HEMORRHOID SURGERY    . left eye surgery    . melenoma resection     right arm  . THYROIDECTOMY    . TONSILLECTOMY    . TOTAL HIP ARTHROPLASTY Right 03/27/2014   Procedure: TOTAL HIP ARTHROPLASTY ANTERIOR APPROACH;  Surgeon: Alta Corning, MD;  Location: [facility];  Service: Orthopedics;  Laterality: Right;    Allergies  Allergen Reactions  . Ace Inhibitors Other (See Comments)    Elevated potassium  . Statins Other (See Comments)    Myalgias   . Vicodin [Hydrocodone-Acetaminophen] Other (See Comments)    "felt spaced out"  . Fosamax [Alendronate Sodium] Other (See Comments)  . Metoclopramide Hcl Other (See Comments)  . Sulfa Antibiotics Rash  . Sulfonamide Derivatives Rash  . Vesicare [Solifenacin] Other (See Comments)    Outpatient Encounter Prescriptions as of 12/04/2016  Medication Sig  . acetaminophen (TYLENOL) 500 MG tablet Take 1,000 mg by mouth 3 (three) times daily as needed for mild pain or moderate pain.  Marland Kitchen aspirin EC 81 MG tablet Take 81 mg by mouth daily.  . calcium carbonate (CAL-GEST ANTACID) 500 MG chewable tablet Chew 1 tablet by mouth daily as needed for indigestion or heartburn.  . Cholecalciferol (VITAMIN D) 2000 UNITS tablet Take 4,000 Units by mouth daily.  Marland Kitchen docusate sodium (COLACE) 100 MG capsule Take 100 mg by mouth 2 (two) times daily as needed for mild constipation.   . famotidine (PEPCID) 20 MG tablet Take 20 mg by mouth at bedtime.  Marland Kitchen guaiFENesin (ROBITUSSIN) 100 MG/5ML SOLN Take 10 mLs by mouth every 6 (six) hours as needed for cough or to loosen phlegm.  . labetalol (NORMODYNE) 100 MG tablet Take 100 mg by mouth 2 (two) times daily.  Marland Kitchen levothyroxine  (SYNTHROID, LEVOTHROID) 75 MCG tablet Take 75 mcg by mouth daily before breakfast.  . memantine (NAMENDA XR) 28 MG CP24 24 hr capsule Take 28 mg by mouth daily.  . Multiple Vitamins-Minerals (PRESERVISION AREDS 2) CAPS Take 1 capsule by mouth 2 (two) times daily.  Marland Kitchen nystatin-triamcinolone (MYCOLOG II) cream Apply 1 application topically. Apply to affected area once a day  . polyethylene glycol (MIRALAX / GLYCOLAX) packet Take 17 g by mouth every other day.   . protein supplement (RESOURCE BENEPROTEIN) POWD Take 1 scoop by mouth 2 (two) times daily.   . sennosides-docusate sodium (SENOKOT-S) 8.6-50 MG tablet Take 2 tablets by mouth at bedtime.   . [DISCONTINUED] saccharomyces boulardii (FLORASTOR) 250 MG capsule Take 250 mg by mouth 2 (two) times daily.   No facility-administered encounter medications on file as of 12/04/2016.     Review of Systems  Constitutional: Negative for appetite change, chills and fever.  HENT: Positive for hearing loss. Negative for congestion, mouth sores, postnasal  drip, rhinorrhea, sinus pressure, sore throat and trouble swallowing.        Has hearing aids  Respiratory: Positive for cough. Negative for shortness of breath.   Cardiovascular: Negative for chest pain and palpitations.  Gastrointestinal: Positive for constipation. Negative for abdominal pain, nausea and vomiting.  Psychiatric/Behavioral: Positive for confusion.    Immunization History  Administered Date(s) Administered  . Influenza, High Dose Seasonal PF 12/27/2013  . Influenza-Unspecified 01/12/2013, 01/02/2015, 01/29/2016  . Pneumococcal-Unspecified 07/01/2012  . Td 05/27/2004  . Tdap 02/21/2012  . Zoster 04/15/2007   Pertinent  Health Maintenance Due  Topic Date Due  . INFLUENZA VACCINE  11/12/2016  . PNA vac Low Risk Adult (2 of 2 - PCV13) 04/14/2017 (Originally 07/01/2013)  . DEXA SCAN  Completed   Fall Risk  11/28/2016 05/22/2014 10/06/2013 06/02/2013  Falls in the past year? No No No No    Risk for fall due to : - - History of fall(s);Impaired balance/gait;Impaired mobility Impaired mobility   Functional Status Survey:    Vitals:   12/04/16 1353  BP: 130/70  Pulse: 72  Resp: 16  Temp: 98.2 F (36.8 C)  TempSrc: Oral  SpO2: 98%  Weight: 136 lb (61.7 kg)  Height: 5\' 1"  (1.549 m)   Body mass index is 25.7 kg/m. Physical Exam  Constitutional: She appears well-developed and well-nourished. No distress.  HENT:  Head: Normocephalic and atraumatic.  Mouth/Throat: Oropharynx is clear and moist.  Hearing aids present  Eyes: Conjunctivae are normal.  Neck: Neck supple. No thyromegaly present.  Cardiovascular: Normal rate and regular rhythm.   Pulmonary/Chest: Effort normal and breath sounds normal. She has no wheezes. She has no rales.  Abdominal: Soft. Bowel sounds are normal. She exhibits no distension. There is no tenderness. There is no guarding.  Musculoskeletal: Normal range of motion.  Has arthritis changes, Can move all 4 extremities, uses a walker.   Lymphadenopathy:    She has no cervical adenopathy.  Neurological: She is alert.  Oriented only to self, has dementia  Skin: Skin is warm and dry. She is not diaphoretic.  Psychiatric: She has a normal mood and affect.    Labs reviewed:  Recent Labs  08/19/16 09/16/16 11/21/16  NA 141 142 142  K 3.5 3.6 4.1  BUN 22* 23* 21  CREATININE 0.8 0.7 0.8    Recent Labs  01/22/16  AST 18  ALT 15  ALKPHOS 121    Recent Labs  03/18/16 07/15/16 09/16/16  WBC 8.1 7.4 8.3  HGB 11.4* 12.1 12.5  HCT 35* 36 37  PLT 225 265 248   Lab Results  Component Value Date   TSH 1.74 07/15/2016   Lab Results  Component Value Date   HGBA1C 5.8 (H) 07/06/2014   Lab Results  Component Value Date   CHOL 228 (A) 07/15/2016   HDL 55 07/15/2016   LDLCALC 146 07/15/2016   TRIG 141 07/15/2016   CHOLHDL 3.2 07/06/2014    Significant Diagnostic Results in last 30 days:  No results  found.  Assessment/Plan  GERD With history of GI bleed. Currently having cough and bringing up undigested food particles. Start omeprazole 20 mg daily for now. Continue famotidine for now  Cough Recent pneumonia, completed treatment, followed by SLP, at risk for aspiration with her dementia and appears to have reflux. Add PPI as above.    Family/ staff Communication: reviewed care plan with patient and charge nurse.    Labs/tests ordered:  none   Kortland Nichols,  MD Internal Medicine [facility] [address] [place], [place] [address] Cell Phone (Monday-Friday 8 am - 5 pm): [phone] On Call: [phone] and follow prompts after 5 pm and on weekends Office Phone: [phone] Office Fax: [phone]

## 2016-12-26 ENCOUNTER — Encounter: Payer: Self-pay | Admitting: Internal Medicine

## 2016-12-26 ENCOUNTER — Non-Acute Institutional Stay (SKILLED_NURSING_FACILITY): Payer: Medicare Other | Admitting: Internal Medicine

## 2016-12-26 DIAGNOSIS — K219 Gastro-esophageal reflux disease without esophagitis: Secondary | ICD-10-CM

## 2016-12-26 DIAGNOSIS — K649 Unspecified hemorrhoids: Secondary | ICD-10-CM

## 2016-12-26 DIAGNOSIS — E034 Atrophy of thyroid (acquired): Secondary | ICD-10-CM

## 2016-12-26 DIAGNOSIS — K5909 Other constipation: Secondary | ICD-10-CM

## 2016-12-26 NOTE — Progress Notes (Signed)
Location:  [facility] Room Number: 31 Place of Service:  SNF [phone]) Provider:  Blanchie Serve MD  Blanchie Serve, MD  Patient Care Team: Blanchie Serve, MD as PCP - General (Internal Medicine) Zadie Rhine Clent Demark, MD as Consulting Physician (Ophthalmology) Burnell Blanks, MD as Consulting Physician (Cardiology) Inda Castle, MD as Consulting Physician (Gastroenterology) Jari Pigg, MD as Consulting Physician (Dermatology) Dorna Leitz, MD as Consulting Physician (Orthopedic Surgery) Mast, Man X, NP as Nurse Practitioner (Internal Medicine)  Extended Emergency Contact Information Primary Emergency Contact: [facility] Address: [address]          [place], [place] [address] Johnnette Litter of [place] Phone: [phone] Relation: Daughter Secondary Emergency Contact: Queenie, [place], [place] [address] Johnnette Litter of [place] Phone: [phone] Relation: Son  Code Status:  DNR Goals of care: Advanced Directive information Advanced Directives 12/26/2016  Does Patient Have a Medical Advance Directive? Yes  Type of Advance Directive Out of facility DNR (pink MOST or yellow form)  Does patient want to make changes to medical advance directive? No - Patient declined  Copy of [place] in Chart? -  Pre-existing out of facility DNR order (yellow form or pink MOST form) Yellow form placed in chart (order not valid for inpatient use);Pink MOST form placed in chart (order not valid for inpatient use)     Chief Complaint  Patient presents with  . Medical Management of Chronic Issues    Routine Visit     HPI:  Pt is a 83 y.o. female seen today for medical management of chronic diseases. She is out of bed daily and gets around with her wheelchair. She denies any concern this visit. Her hemorrhoids bleed at times but otherwise denies pain. She moves her bowel regularly. She is incontinent with her urine. Tolerating PPI  well. Has not required prn colace.   Past Medical History:  Diagnosis Date  . Acute blood loss anemia 03/29/2014   03/30/14 Hgb 7.3 04/04/14 Hgb 8.5 continue Fe bid.  05/04/14 Hgb 10.8   . Benign neoplasm of colon   . Cancer ([place])    pre-melanoma  . Cystocele   . Dementia of the Alzheimer's type 05/22/2014   05/19/14 MMSE 18/30 Namenda.    . Diastolic dysfunction 0/01/2724  . Diverticulosis of colon (without mention of hemorrhage)   . Diverticulosis of large intestine 06/28/2004  . Esophageal reflux   . Essential hypertension 07/05/2013  . Gastroparesis   . Hemorrhoids   . HTN (hypertension)   . Hyperlipidemia 07/05/2013  . Hypertension   . Hypothyroidism   . Internal hemorrhoids without mention of complication 36/09/4401  . Macular degeneration   . Nonspecific abnormal electrocardiogram (ECG) (EKG)   . Osteoarthritis    right hip  . Other and unspecified hyperlipidemia   . Prediabetes 11/17/2013  . Primary osteoarthritis of right hip 03/27/2014  . Shingles   . Skin cancer (melanoma) ([place])   . Stricture and stenosis of esophagus   . Stricture and stenosis of esophagus 10/27/2007  . Vitamin D deficiency 11/17/2013   Past Surgical History:  Procedure Laterality Date  . APPENDECTOMY    . BCC resection     legs/face  . CATARACT EXTRACTION, BILATERAL    . CHOLECYSTECTOMY    . FLEXIBLE SIGMOIDOSCOPY  05/12/2011   Procedure: FLEXIBLE SIGMOIDOSCOPY;  Surgeon: Inda Castle, MD;  [facility];  Service: Endoscopy;  Laterality: N/A;  .  FLEXIBLE SIGMOIDOSCOPY  09/10/2011   Procedure: FLEXIBLE SIGMOIDOSCOPY;  Surgeon: Inda Castle, MD;  [facility];  Service: Endoscopy;  Laterality: N/A;  . FLEXIBLE SIGMOIDOSCOPY N/A 08/16/2012   Procedure: FLEXIBLE SIGMOIDOSCOPY;  Surgeon: Inda Castle, MD;  [facility];  Service: Endoscopy;  Laterality: N/A;  . FRACTURE SURGERY     left hip pin  . GALLBLADDER SURGERY  2002  . HEMORRHOID BANDING N/A 08/16/2012    Procedure: HEMORRHOID BANDING;  Surgeon: Inda Castle, MD;  [facility];  Service: Endoscopy;  Laterality: N/A;  . HEMORRHOID SURGERY    . left eye surgery    . melenoma resection     right arm  . THYROIDECTOMY    . TONSILLECTOMY    . TOTAL HIP ARTHROPLASTY Right 03/27/2014   Procedure: TOTAL HIP ARTHROPLASTY ANTERIOR APPROACH;  Surgeon: Alta Corning, MD;  Location: [facility];  Service: Orthopedics;  Laterality: Right;    Allergies  Allergen Reactions  . Ace Inhibitors Other (See Comments)    Elevated potassium  . Statins Other (See Comments)    Myalgias   . Vicodin [Hydrocodone-Acetaminophen] Other (See Comments)    "felt spaced out"  . Fosamax [Alendronate Sodium] Other (See Comments)  . Metoclopramide Hcl Other (See Comments)  . Sulfa Antibiotics Rash  . Sulfonamide Derivatives Rash  . Vesicare [Solifenacin] Other (See Comments)    Outpatient Encounter Prescriptions as of 12/26/2016  Medication Sig  . acetaminophen (TYLENOL) 500 MG tablet Take 1,000 mg by mouth 3 (three) times daily as needed for mild pain or moderate pain.  Marland Kitchen aspirin EC 81 MG tablet Take 81 mg by mouth daily.  . calcium carbonate (CAL-GEST ANTACID) 500 MG chewable tablet Chew 1 tablet by mouth daily as needed for indigestion or heartburn.  . Cholecalciferol (VITAMIN D) 2000 UNITS tablet Take 4,000 Units by mouth daily.  Marland Kitchen docusate sodium (COLACE) 100 MG capsule Take 200 mg by mouth 2 (two) times daily as needed for mild constipation.   . famotidine (PEPCID) 20 MG tablet Take 20 mg by mouth at bedtime.  Marland Kitchen guaiFENesin (ROBITUSSIN) 100 MG/5ML SOLN Take 10 mLs by mouth every 6 (six) hours as needed for cough or to loosen phlegm.  . labetalol (NORMODYNE) 100 MG tablet Take 100 mg by mouth 2 (two) times daily.  Marland Kitchen levothyroxine (SYNTHROID, LEVOTHROID) 75 MCG tablet Take 75 mcg by mouth daily before breakfast.  . memantine (NAMENDA XR) 28 MG CP24 24 hr capsule Take 28 mg by mouth daily.  . Multiple  Vitamins-Minerals (PRESERVISION AREDS 2) CAPS Take 1 capsule by mouth 2 (two) times daily.  Marland Kitchen omeprazole (PRILOSEC) 20 MG capsule Take 20 mg by mouth daily.  . polyethylene glycol (MIRALAX / GLYCOLAX) packet Take 17 g by mouth every other day.   . protein supplement (RESOURCE BENEPROTEIN) POWD Take 1 scoop by mouth 2 (two) times daily.   . sennosides-docusate sodium (SENOKOT-S) 8.6-50 MG tablet Take 2 tablets by mouth at bedtime.   . [DISCONTINUED] nystatin-triamcinolone (MYCOLOG II) cream Apply 1 application topically. Apply to affected area once a day   No facility-administered encounter medications on file as of 12/26/2016.     Review of Systems  Constitutional: Negative for appetite change and fever.  HENT: Positive for hearing loss. Negative for congestion, mouth sores, sore throat and trouble swallowing.        Has hearing aids  Respiratory: Negative for cough and shortness of breath.   Cardiovascular: Negative for chest pain and palpitations.  Gastrointestinal: Positive for constipation. Negative for abdominal pain, blood in stool, diarrhea, nausea and vomiting.  Genitourinary: Negative for dysuria.  Musculoskeletal: Positive for arthralgias and gait problem.  Neurological: Negative for dizziness, tremors and headaches.  Psychiatric/Behavioral: Positive for confusion.    Immunization History  Administered Date(s) Administered  . Influenza, High Dose Seasonal PF 12/27/2013  . Influenza-Unspecified 01/12/2013, 01/02/2015, 01/29/2016  . Pneumococcal-Unspecified 07/01/2012  . Td 05/27/2004  . Tdap 02/21/2012  . Zoster 04/15/2007   Pertinent  Health Maintenance Due  Topic Date Due  . INFLUENZA VACCINE  01/12/2017 (Originally 11/12/2016)  . PNA vac Low Risk Adult (2 of 2 - PCV13) 04/14/2017 (Originally 07/01/2013)  . DEXA SCAN  Completed   Fall Risk  11/28/2016 05/22/2014 10/06/2013 06/02/2013  Falls in the past year? No No No No  Risk for fall due to : - - History of fall(s);Impaired  balance/gait;Impaired mobility Impaired mobility   Functional Status Survey:    Vitals:   12/26/16 1403  BP: 122/62  Pulse: 67  Resp: 18  Temp: (!) 97.4 F (36.3 C)  TempSrc: Oral  Weight: 136 lb 9.6 oz (62 kg)  Height: 5\' 1"  (1.549 m)   Body mass index is 25.81 kg/m.   Wt Readings from Last 3 Encounters:  12/26/16 136 lb 9.6 oz (62 kg)  12/04/16 136 lb (61.7 kg)  11/28/16 136 lb (61.7 kg)    Physical Exam  Constitutional: She appears well-developed and well-nourished. No distress.  HENT:  Head: Normocephalic and atraumatic.  Mouth/Throat: Oropharynx is clear and moist.  Hearing aids present  Eyes: Pupils are equal, round, and reactive to light. Conjunctivae are normal. Right eye exhibits no discharge. Left eye exhibits no discharge.  Neck: Normal range of motion. Neck supple. No thyromegaly present.  Cardiovascular: Normal rate and regular rhythm.   Pulmonary/Chest: Effort normal. No respiratory distress. She has no wheezes. She has no rales.  Decreased air entry to lung bases  Abdominal: Soft. Bowel sounds are normal. There is no tenderness.  Musculoskeletal: Normal range of motion. She exhibits no edema.  Has arthritis changes, Can move all 4 extremities, uses wheelchair  Lymphadenopathy:    She has no cervical adenopathy.  Neurological: She is alert.  Oriented only to self, has dementia  Skin: Skin is warm and dry. She is not diaphoretic.  Psychiatric: She has a normal mood and affect.    Labs reviewed:  Recent Labs  08/19/16 09/16/16 11/21/16  NA 141 142 142  K 3.5 3.6 4.1  BUN 22* 23* 21  CREATININE 0.8 0.7 0.8    Recent Labs  01/22/16  AST 18  ALT 15  ALKPHOS 121    Recent Labs  03/18/16 07/15/16 09/16/16  WBC 8.1 7.4 8.3  HGB 11.4* 12.1 12.5  HCT 35* 36 37  PLT 225 265 248   Lab Results  Component Value Date   TSH 1.74 07/15/2016   Lab Results  Component Value Date   HGBA1C 5.8 (H) 07/06/2014   Lab Results  Component Value Date     CHOL 228 (A) 07/15/2016   HDL 55 07/15/2016   LDLCALC 146 07/15/2016   TRIG 141 07/15/2016   CHOLHDL 3.2 07/06/2014    Significant Diagnostic Results in last 30 days:  No results found.  Assessment/Plan  Chronic constipation Stable, continue senokot s 2 tab qhs and miralax qod. D/c prn colace order. Maintain hydration  Acquired hypothyroidism Lab Results  Component Value Date   TSH 1.74 07/15/2016   check  tsh, continue levothyroxine 75 mcg daily  gerd With history of GI bleed, gastroparesis. stable symptom. Continue famotidine but decrease to 10 mg daily for now, stable h&h. Continue omeprazole 20 mg daily.   Hemorrhoids No bleed reported. Avoid constipation. Continue senokot s 2 tab qhs and miralx qod. Maintain hydration. D/c prn colace order    Labs/tests ordered:  TSH   Blanchie Serve, MD Internal Medicine [facility] [address] [place], [place] [address] Cell Phone (Monday-Friday 8 am - 5 pm): [phone] On Call: [phone] and follow prompts after 5 pm and on weekends Office Phone: [phone] Office Fax: [phone]

## 2016-12-30 DIAGNOSIS — E039 Hypothyroidism, unspecified: Secondary | ICD-10-CM | POA: Diagnosis not present

## 2016-12-30 LAB — TSH: TSH: 9.52 — AB (ref 0.41–5.90)

## 2017-01-01 ENCOUNTER — Encounter: Payer: Self-pay | Admitting: Internal Medicine

## 2017-01-01 ENCOUNTER — Non-Acute Institutional Stay (SKILLED_NURSING_FACILITY): Payer: Medicare Other | Admitting: Internal Medicine

## 2017-01-01 DIAGNOSIS — E039 Hypothyroidism, unspecified: Secondary | ICD-10-CM | POA: Diagnosis not present

## 2017-01-01 NOTE — Progress Notes (Signed)
Location:  [facility] Room Number: 44 Place of Service:  SNF [phone]) Provider:  Blanchie Serve MD  Blanchie Serve, MD  Patient Care Team: Blanchie Serve, MD as PCP - General (Internal Medicine) Zadie Rhine Clent Demark, MD as Consulting Physician (Ophthalmology) Burnell Blanks, MD as Consulting Physician (Cardiology) Inda Castle, MD as Consulting Physician (Gastroenterology) Jari Pigg, MD as Consulting Physician (Dermatology) Dorna Leitz, MD as Consulting Physician (Orthopedic Surgery) Mast, Man X, NP as Nurse Practitioner (Internal Medicine)  Extended Emergency Contact Information Primary Emergency Contact: [facility] Address: [address]          [place], [place] [address] Johnnette Litter of [place] Phone: [phone] Relation: Daughter Secondary Emergency Contact: Luciana, [place], [place] [address] Johnnette Litter of [place] Phone: [phone] Relation: Son  Code Status:  DNR Goals of care: Advanced Directive information Advanced Directives 01/01/2017  Does Patient Have a Medical Advance Directive? Yes  Type of Advance Directive Out of facility DNR (pink MOST or yellow form)  Does patient want to make changes to medical advance directive? No - Patient declined  Copy of [place] in Chart? -  Pre-existing out of facility DNR order (yellow form or pink MOST form) Yellow form placed in chart (order not valid for inpatient use);Pink MOST form placed in chart (order not valid for inpatient use)     Chief Complaint  Patient presents with  . Acute Visit    Elevated TSH    HPI:  Pt is a 83 y.o. female seen today for acute visit. She is currently on levothyroxine for hypothyroidism and had her TSH checked. TSH is elevated at 9.52. She is seen in her room. She has been moving her bowels regularly. Denies cold intolerance. Appetite is good.    Past Medical History:  Diagnosis Date  . Acute blood loss anemia  03/29/2014   03/30/14 Hgb 7.3 04/04/14 Hgb 8.5 continue Fe bid.  05/04/14 Hgb 10.8   . Benign neoplasm of colon   . Cancer ([place])    pre-melanoma  . Cystocele   . Dementia of the Alzheimer's type 05/22/2014   05/19/14 MMSE 18/30 Namenda.    . Diastolic dysfunction 07/15/4740  . Diverticulosis of colon (without mention of hemorrhage)   . Diverticulosis of large intestine 06/28/2004  . Esophageal reflux   . Essential hypertension 07/05/2013  . Gastroparesis   . Hemorrhoids   . HTN (hypertension)   . Hyperlipidemia 07/05/2013  . Hypertension   . Hypothyroidism   . Internal hemorrhoids without mention of complication 59/08/6385  . Macular degeneration   . Nonspecific abnormal electrocardiogram (ECG) (EKG)   . Osteoarthritis    right hip  . Other and unspecified hyperlipidemia   . Prediabetes 11/17/2013  . Primary osteoarthritis of right hip 03/27/2014  . Shingles   . Skin cancer (melanoma) ([place])   . Stricture and stenosis of esophagus   . Stricture and stenosis of esophagus 10/27/2007  . Vitamin D deficiency 11/17/2013   Past Surgical History:  Procedure Laterality Date  . APPENDECTOMY    . BCC resection     legs/face  . CATARACT EXTRACTION, BILATERAL    . CHOLECYSTECTOMY    . FLEXIBLE SIGMOIDOSCOPY  05/12/2011   Procedure: FLEXIBLE SIGMOIDOSCOPY;  Surgeon: Inda Castle, MD;  Location: [facility];  Service: Endoscopy;  Laterality: N/A;  . FLEXIBLE SIGMOIDOSCOPY  09/10/2011   Procedure: FLEXIBLE SIGMOIDOSCOPY;  Surgeon: Inda Castle,  MD;  [facility];  Service: Endoscopy;  Laterality: N/A;  . FLEXIBLE SIGMOIDOSCOPY N/A 08/16/2012   Procedure: FLEXIBLE SIGMOIDOSCOPY;  Surgeon: Inda Castle, MD;  [facility];  Service: Endoscopy;  Laterality: N/A;  . FRACTURE SURGERY     left hip pin  . GALLBLADDER SURGERY  2002  . HEMORRHOID BANDING N/A 08/16/2012   Procedure: HEMORRHOID BANDING;  Surgeon: Inda Castle, MD;  [facility];  Service: Endoscopy;   Laterality: N/A;  . HEMORRHOID SURGERY    . left eye surgery    . melenoma resection     right arm  . THYROIDECTOMY    . TONSILLECTOMY    . TOTAL HIP ARTHROPLASTY Right 03/27/2014   Procedure: TOTAL HIP ARTHROPLASTY ANTERIOR APPROACH;  Surgeon: Alta Corning, MD;  Location: [facility];  Service: Orthopedics;  Laterality: Right;    Allergies  Allergen Reactions  . Ace Inhibitors Other (See Comments)    Elevated potassium  . Statins Other (See Comments)    Myalgias   . Vicodin [Hydrocodone-Acetaminophen] Other (See Comments)    "felt spaced out"  . Fosamax [Alendronate Sodium] Other (See Comments)  . Metoclopramide Hcl Other (See Comments)  . Sulfa Antibiotics Rash  . Sulfonamide Derivatives Rash  . Vesicare [Solifenacin] Other (See Comments)    Outpatient Encounter Prescriptions as of 01/01/2017  Medication Sig  . acetaminophen (TYLENOL) 500 MG tablet Take 1,000 mg by mouth 3 (three) times daily as needed for mild pain or moderate pain.  Marland Kitchen aspirin EC 81 MG tablet Take 81 mg by mouth daily.  . calcium carbonate (CAL-GEST ANTACID) 500 MG chewable tablet Chew 1 tablet by mouth daily as needed for indigestion or heartburn.  . Cholecalciferol (VITAMIN D) 2000 UNITS tablet Take 4,000 Units by mouth daily.  . famotidine (PEPCID) 10 MG tablet Take 10 mg by mouth at bedtime.  Marland Kitchen guaiFENesin (ROBITUSSIN) 100 MG/5ML SOLN Take 10 mLs by mouth every 6 (six) hours as needed for cough or to loosen phlegm.  . labetalol (NORMODYNE) 100 MG tablet Take 100 mg by mouth 2 (two) times daily.  Marland Kitchen levothyroxine (SYNTHROID, LEVOTHROID) 75 MCG tablet Take 75 mcg by mouth daily before breakfast.  . memantine (NAMENDA XR) 28 MG CP24 24 hr capsule Take 28 mg by mouth daily.  . Multiple Vitamins-Minerals (PRESERVISION AREDS 2) CAPS Take 1 capsule by mouth 2 (two) times daily.  Marland Kitchen omeprazole (PRILOSEC) 20 MG capsule Take 20 mg by mouth daily.  . polyethylene glycol (MIRALAX / GLYCOLAX) packet Take 17 g by mouth every  other day.   . protein supplement (RESOURCE BENEPROTEIN) POWD Take 1 scoop by mouth 2 (two) times daily.   . sennosides-docusate sodium (SENOKOT-S) 8.6-50 MG tablet Take 2 tablets by mouth at bedtime.   . [DISCONTINUED] docusate sodium (COLACE) 100 MG capsule Take 200 mg by mouth 2 (two) times daily as needed for mild constipation.   . [DISCONTINUED] famotidine (PEPCID) 20 MG tablet Take 20 mg by mouth at bedtime.   No facility-administered encounter medications on file as of 01/01/2017.     Review of Systems  Constitutional: Negative for appetite change, chills, diaphoresis and fever.  HENT: Positive for hearing loss. Negative for congestion, mouth sores and trouble swallowing.        Has hearing aids  Respiratory: Negative for cough, choking and shortness of breath.   Cardiovascular: Negative for chest pain and palpitations.  Gastrointestinal: Negative for abdominal pain, constipation, diarrhea, nausea and vomiting.  Genitourinary: Negative for  dysuria.  Musculoskeletal: Positive for arthralgias and gait problem.  Skin: Negative for rash and wound.  Neurological: Negative for dizziness, tremors and headaches.  Psychiatric/Behavioral: Positive for confusion.    Immunization History  Administered Date(s) Administered  . Influenza, High Dose Seasonal PF 12/27/2013  . Influenza-Unspecified 01/12/2013, 01/02/2015, 01/29/2016  . Pneumococcal-Unspecified 07/01/2012  . Td 05/27/2004  . Tdap 02/21/2012  . Zoster 04/15/2007   Pertinent  Health Maintenance Due  Topic Date Due  . INFLUENZA VACCINE  01/12/2017 (Originally 11/12/2016)  . PNA vac Low Risk Adult (2 of 2 - PCV13) 04/14/2017 (Originally 07/01/2013)  . DEXA SCAN  Completed   Fall Risk  11/28/2016 05/22/2014 10/06/2013 06/02/2013  Falls in the past year? No No No No  Risk for fall due to : - - History of fall(s);Impaired balance/gait;Impaired mobility Impaired mobility   Functional Status Survey:    Vitals:   01/01/17 0957  BP:  122/62  Pulse: 68  Resp: 16  Temp: (!) 97.4 F (36.3 C)  TempSrc: Oral  Weight: 136 lb 9.6 oz (62 kg)  Height: 5\' 1"  (1.549 m)   Body mass index is 25.81 kg/m.   Wt Readings from Last 3 Encounters:  01/01/17 136 lb 9.6 oz (62 kg)  12/26/16 136 lb 9.6 oz (62 kg)  12/04/16 136 lb (61.7 kg)    Physical Exam  Constitutional: She appears well-developed and well-nourished. No distress.  HENT:  Head: Normocephalic and atraumatic.  Mouth/Throat: Oropharynx is clear and moist.  Hearing aids present  Eyes: Pupils are equal, round, and reactive to light. Conjunctivae are normal. Right eye exhibits no discharge. Left eye exhibits no discharge.  Neck: Normal range of motion. Neck supple. No thyromegaly present.  Cardiovascular: Normal rate and regular rhythm.   Pulmonary/Chest: Effort normal. No respiratory distress. She has no wheezes. She has no rales.  Decreased air entry to lung bases  Abdominal: Soft. Bowel sounds are normal. There is no tenderness.  Musculoskeletal: Normal range of motion. She exhibits no edema.  Has arthritis changes, Can move all 4 extremities, uses wheelchair  Lymphadenopathy:    She has no cervical adenopathy.  Neurological: She is alert.  Oriented only to self, has dementia  Skin: Skin is warm and dry. She is not diaphoretic.  Psychiatric: She has a normal mood and affect.    Labs reviewed:  Recent Labs  08/19/16 09/16/16 11/21/16  NA 141 142 142  K 3.5 3.6 4.1  BUN 22* 23* 21  CREATININE 0.8 0.7 0.8    Recent Labs  01/22/16  AST 18  ALT 15  ALKPHOS 121    Recent Labs  03/18/16 07/15/16 09/16/16  WBC 8.1 7.4 8.3  HGB 11.4* 12.1 12.5  HCT 35* 36 37  PLT 225 265 248   Lab Results  Component Value Date   TSH 9.52 (A) 12/30/2016   Lab Results  Component Value Date   HGBA1C 5.8 (H) 07/06/2014   Lab Results  Component Value Date   CHOL 228 (A) 07/15/2016   HDL 55 07/15/2016   LDLCALC 146 07/15/2016   TRIG 141 07/15/2016   CHOLHDL  3.2 07/06/2014    Significant Diagnostic Results in last 30 days:  No results found.  Assessment/Plan  Acquired hypothyroidism Lab Results  Component Value Date   TSH 9.52 (A) 12/30/2016   Currently on levothyroxine 75 mcg daily. Increase this to 100 mcg daily and recheck tsh in 8 weeks. Monitor clinically.     Labs/tests ordered:  TSH  Blanchie Serve, MD Internal Medicine [facility] [address] [place], [place] [address] Cell Phone (Monday-Friday 8 am - 5 pm): [phone] On Call: [phone] and follow prompts after 5 pm and on weekends Office Phone: [phone] Office Fax: [phone]

## 2017-01-08 ENCOUNTER — Non-Acute Institutional Stay (SKILLED_NURSING_FACILITY): Payer: Medicare Other | Admitting: Internal Medicine

## 2017-01-08 ENCOUNTER — Encounter: Payer: Self-pay | Admitting: Internal Medicine

## 2017-01-08 DIAGNOSIS — R350 Frequency of micturition: Secondary | ICD-10-CM

## 2017-01-08 DIAGNOSIS — R410 Disorientation, unspecified: Secondary | ICD-10-CM

## 2017-01-08 NOTE — Progress Notes (Signed)
Location:  [facility] Room Number: 54 Place of Service:  SNF [phone]) Provider:  Blanchie Serve MD  Blanchie Serve, MD  Patient Care Team: Blanchie Serve, MD as PCP - General (Internal Medicine) Zadie Rhine Clent Demark, MD as Consulting Physician (Ophthalmology) Burnell Blanks, MD as Consulting Physician (Cardiology) Inda Castle, MD as Consulting Physician (Gastroenterology) Jari Pigg, MD as Consulting Physician (Dermatology) Dorna Leitz, MD as Consulting Physician (Orthopedic Surgery) Mast, Man X, NP as Nurse Practitioner (Internal Medicine)  Extended Emergency Contact Information Primary Emergency Contact: [facility] Address: [address]          [place], [place] [address] Johnnette Litter of [place] Phone: [phone] Relation: Daughter Secondary Emergency Contact: [place], [place],  [address] Johnnette Litter of [place] Phone: [phone] Relation: Son  Code Status:  DNR Goals of care: Advanced Directive information Advanced Directives 01/08/2017  Does Patient Have a Medical Advance Directive? Yes  Type of Advance Directive Out of facility DNR (pink MOST or yellow form)  Does patient want to make changes to medical advance directive? No - Patient declined  Copy of [place] in Chart? -  Pre-existing out of facility DNR order (yellow form or pink MOST form) Yellow form placed in chart (order not valid for inpatient use);Pink MOST form placed in chart (order not valid for inpatient use)     Chief Complaint  Patient presents with  . Acute Visit    Increased urinary frequency    HPI:  Pt is a 83 y.o. female seen today for acute visit for increased confusion and increased urinary frequency per nursing. She is seen in her room today. She denies increased urinary frequency or urgency. She has dementia and this limits her history taking. She is afebrile. Appetite is good. Per nursing report pt was confused  this am while receiving levothyroxine and asked if it safe to take during pregnancy.    Past Medical History:  Diagnosis Date  . Acute blood loss anemia 03/29/2014   03/30/14 Hgb 7.3 04/04/14 Hgb 8.5 continue Fe bid.  05/04/14 Hgb 10.8   . Benign neoplasm of colon   . Cancer ([place])    pre-melanoma  . Cystocele   . Dementia of the Alzheimer's type 05/22/2014   05/19/14 MMSE 18/30 Namenda.    . Diastolic dysfunction 06/25/9700  . Diverticulosis of colon (without mention of hemorrhage)   . Diverticulosis of large intestine 06/28/2004  . Esophageal reflux   . Essential hypertension 07/05/2013  . Gastroparesis   . Hemorrhoids   . HTN (hypertension)   . Hyperlipidemia 07/05/2013  . Hypertension   . Hypothyroidism   . Internal hemorrhoids without mention of complication 63/10/8586  . Macular degeneration   . Nonspecific abnormal electrocardiogram (ECG) (EKG)   . Osteoarthritis    right hip  . Other and unspecified hyperlipidemia   . Prediabetes 11/17/2013  . Primary osteoarthritis of right hip 03/27/2014  . Shingles   . Skin cancer (melanoma) ([place])   . Stricture and stenosis of esophagus   . Stricture and stenosis of esophagus 10/27/2007  . Vitamin D deficiency 11/17/2013   Past Surgical History:  Procedure Laterality Date  . APPENDECTOMY    . BCC resection     legs/face  . CATARACT EXTRACTION, BILATERAL    . CHOLECYSTECTOMY    . FLEXIBLE SIGMOIDOSCOPY  05/12/2011   Procedure: FLEXIBLE SIGMOIDOSCOPY;  Surgeon: Inda Castle, MD;  Location: [facility];  Service: Endoscopy;  Laterality: N/A;  . FLEXIBLE SIGMOIDOSCOPY  09/10/2011   Procedure: FLEXIBLE SIGMOIDOSCOPY;  Surgeon: Inda Castle, MD;  [facility];  Service: Endoscopy;  Laterality: N/A;  . FLEXIBLE SIGMOIDOSCOPY N/A 08/16/2012   Procedure: FLEXIBLE SIGMOIDOSCOPY;  Surgeon: Inda Castle, MD;  [facility];  Service: Endoscopy;  Laterality: N/A;  . FRACTURE SURGERY     left hip pin  . GALLBLADDER SURGERY   2002  . HEMORRHOID BANDING N/A 08/16/2012   Procedure: HEMORRHOID BANDING;  Surgeon: Inda Castle, MD;  [facility];  Service: Endoscopy;  Laterality: N/A;  . HEMORRHOID SURGERY    . left eye surgery    . melenoma resection     right arm  . THYROIDECTOMY    . TONSILLECTOMY    . TOTAL HIP ARTHROPLASTY Right 03/27/2014   Procedure: TOTAL HIP ARTHROPLASTY ANTERIOR APPROACH;  Surgeon: Alta Corning, MD;  Location: [facility];  Service: Orthopedics;  Laterality: Right;    Allergies  Allergen Reactions  . Ace Inhibitors Other (See Comments)    Elevated potassium  . Statins Other (See Comments)    Myalgias   . Vicodin [Hydrocodone-Acetaminophen] Other (See Comments)    "felt spaced out"  . Fosamax [Alendronate Sodium] Other (See Comments)  . Metoclopramide Hcl Other (See Comments)  . Sulfa Antibiotics Rash  . Sulfonamide Derivatives Rash  . Vesicare [Solifenacin] Other (See Comments)    Outpatient Encounter Prescriptions as of 01/08/2017  Medication Sig  . acetaminophen (TYLENOL) 500 MG tablet Take 1,000 mg by mouth 3 (three) times daily as needed for mild pain or moderate pain.  Marland Kitchen aspirin EC 81 MG tablet Take 81 mg by mouth daily.  . calcium carbonate (CAL-GEST ANTACID) 500 MG chewable tablet Chew 1 tablet by mouth daily as needed for indigestion or heartburn.  . Cholecalciferol (VITAMIN D) 2000 UNITS tablet Take 4,000 Units by mouth daily.  . famotidine (PEPCID) 10 MG tablet Take 10 mg by mouth at bedtime.  Marland Kitchen guaiFENesin (ROBITUSSIN) 100 MG/5ML SOLN Take 10 mLs by mouth every 6 (six) hours as needed for cough or to loosen phlegm.  . labetalol (NORMODYNE) 100 MG tablet Take 100 mg by mouth 2 (two) times daily.  Marland Kitchen levothyroxine (SYNTHROID, LEVOTHROID) 100 MCG tablet Take 100 mcg by mouth daily before breakfast.  . memantine (NAMENDA XR) 28 MG CP24 24 hr capsule Take 28 mg by mouth daily.  . Multiple Vitamins-Minerals (PRESERVISION AREDS 2) CAPS Take 1 capsule by mouth 2 (two)  times daily.  Marland Kitchen omeprazole (PRILOSEC) 20 MG capsule Take 20 mg by mouth daily.  . polyethylene glycol (MIRALAX / GLYCOLAX) packet Take 17 g by mouth every other day.   . protein supplement (RESOURCE BENEPROTEIN) POWD Take 1 scoop by mouth 2 (two) times daily.   . sennosides-docusate sodium (SENOKOT-S) 8.6-50 MG tablet Take 2 tablets by mouth at bedtime.   . [DISCONTINUED] levothyroxine (SYNTHROID, LEVOTHROID) 75 MCG tablet Take 75 mcg by mouth daily before breakfast.   No facility-administered encounter medications on file as of 01/08/2017.     Review of Systems  Constitutional: Negative for appetite change, chills, diaphoresis and fever.  HENT: Positive for hearing loss. Negative for congestion, mouth sores and trouble swallowing.        Has hearing aids  Respiratory: Negative for cough, choking and shortness of breath.   Cardiovascular: Negative for chest pain and palpitations.  Gastrointestinal: Negative for abdominal pain, constipation, diarrhea, nausea and vomiting.  Genitourinary: Negative  for dysuria, flank pain, hematuria and pelvic pain.  Musculoskeletal: Positive for arthralgias and gait problem.  Skin: Negative for rash and wound.  Neurological: Negative for dizziness, tremors and headaches.  Psychiatric/Behavioral: Positive for confusion.    Immunization History  Administered Date(s) Administered  . Influenza, High Dose Seasonal PF 12/27/2013  . Influenza-Unspecified 01/12/2013, 01/02/2015, 01/29/2016  . Pneumococcal-Unspecified 07/01/2012  . Td 05/27/2004  . Tdap 02/21/2012  . Zoster 04/15/2007   Pertinent  Health Maintenance Due  Topic Date Due  . INFLUENZA VACCINE  01/12/2017 (Originally 11/12/2016)  . PNA vac Low Risk Adult (2 of 2 - PCV13) 04/14/2017 (Originally 07/01/2013)  . DEXA SCAN  Completed   Fall Risk  11/28/2016 05/22/2014 10/06/2013 06/02/2013  Falls in the past year? No No No No  Risk for fall due to : - - History of fall(s);Impaired balance/gait;Impaired  mobility Impaired mobility   Functional Status Survey:    Vitals:   01/08/17 1626  BP: 140/80  Pulse: 86  Resp: 18  Temp: 98.4 F (36.9 C)  TempSrc: Oral  SpO2: 97%  Weight: 136 lb 9.6 oz (62 kg)  Height: 5\' 1"  (1.549 m)   Body mass index is 25.81 kg/m.   Wt Readings from Last 3 Encounters:  01/08/17 136 lb 9.6 oz (62 kg)  01/01/17 136 lb 9.6 oz (62 kg)  12/26/16 136 lb 9.6 oz (62 kg)    Physical Exam  Constitutional: She appears well-developed and well-nourished. No distress.  HENT:  Head: Normocephalic and atraumatic.  Mouth/Throat: Oropharynx is clear and moist.  Hearing aids present  Eyes: Pupils are equal, round, and reactive to light. Conjunctivae are normal. Right eye exhibits no discharge. Left eye exhibits no discharge.  Neck: Normal range of motion. Neck supple. No thyromegaly present.  Cardiovascular: Normal rate and regular rhythm.   Pulmonary/Chest: Effort normal. No respiratory distress. She has no wheezes. She has no rales.  Decreased air entry to lung bases  Abdominal: Soft. Bowel sounds are normal. She exhibits no distension and no mass. There is no tenderness. There is no rebound and no guarding.  Musculoskeletal: Normal range of motion. She exhibits no edema.  Has arthritis changes, Can move all 4 extremities, uses wheelchair  Lymphadenopathy:    She has no cervical adenopathy.  Neurological: She is alert.  Oriented only to self, has dementia  Skin: Skin is warm and dry. She is not diaphoretic.  Psychiatric: She has a normal mood and affect.    Labs reviewed:  Recent Labs  08/19/16 09/16/16 11/21/16  NA 141 142 142  K 3.5 3.6 4.1  BUN 22* 23* 21  CREATININE 0.8 0.7 0.8    Recent Labs  01/22/16  AST 18  ALT 15  ALKPHOS 121    Recent Labs  03/18/16 07/15/16 09/16/16  WBC 8.1 7.4 8.3  HGB 11.4* 12.1 12.5  HCT 35* 36 37  PLT 225 265 248   Lab Results  Component Value Date   TSH 9.52 (A) 12/30/2016   Lab Results  Component  Value Date   HGBA1C 5.8 (H) 07/06/2014   Lab Results  Component Value Date   CHOL 228 (A) 07/15/2016   HDL 55 07/15/2016   LDLCALC 146 07/15/2016   TRIG 141 07/15/2016   CHOLHDL 3.2 07/06/2014    Significant Diagnostic Results in last 30 days:  No results found.  Assessment/Plan  AMS No acute change in mental state noted at bedside today. No fever. No signs of infection on exam. Po  intake unchanged. Has dementia and likely has progression of her dementia. Maintain hydration and provide supportive care. Continue memantine. Her behavior this am was likely due to her dementia. Monitor clinically and consider lab work/ investigation if indicated.   Urinary frequency Per nursing increased frequency with her being taken to bathroom several times. Pt denies frequency and urgency. Denies dysuria or cva or suprapubic pain. She is afebrile. Will encourage hydration and maintain perineal care. Monitor clinically.     Labs/tests ordered:  none   Blanchie Serve, MD Internal Medicine [facility] [address] [place], [place] [address] Cell Phone (Monday-Friday 8 am - 5 pm): [phone] On Call: [phone] and follow prompts after 5 pm and on weekends Office Phone: [phone] Office Fax: [phone]

## 2017-01-27 ENCOUNTER — Encounter: Payer: Self-pay | Admitting: Nurse Practitioner

## 2017-01-27 ENCOUNTER — Non-Acute Institutional Stay (SKILLED_NURSING_FACILITY): Payer: Medicare Other | Admitting: Nurse Practitioner

## 2017-01-27 DIAGNOSIS — F0280 Dementia in other diseases classified elsewhere without behavioral disturbance: Secondary | ICD-10-CM

## 2017-01-27 DIAGNOSIS — E039 Hypothyroidism, unspecified: Secondary | ICD-10-CM | POA: Diagnosis not present

## 2017-01-27 DIAGNOSIS — I10 Essential (primary) hypertension: Secondary | ICD-10-CM | POA: Diagnosis not present

## 2017-01-27 DIAGNOSIS — G309 Alzheimer's disease, unspecified: Secondary | ICD-10-CM | POA: Diagnosis not present

## 2017-01-27 DIAGNOSIS — K219 Gastro-esophageal reflux disease without esophagitis: Secondary | ICD-10-CM

## 2017-01-27 DIAGNOSIS — I5032 Chronic diastolic (congestive) heart failure: Secondary | ICD-10-CM

## 2017-01-27 NOTE — Progress Notes (Signed)
Location:  [facility] Room Number: 32 Place of Service:  SNF (31) Provider:  Burrell Hodapp, Manxie  NP  Blanchie Serve, MD  Patient Care Team: Blanchie Serve, MD as PCP - General (Internal Medicine) Zadie Rhine Clent Demark, MD as Consulting Physician (Ophthalmology) Burnell Blanks, MD as Consulting Physician (Cardiology) Inda Castle, MD as Consulting Physician (Gastroenterology) Jari Pigg, MD as Consulting Physician (Dermatology) Dorna Leitz, MD as Consulting Physician (Orthopedic Surgery) Xiao Graul X, NP as Nurse Practitioner (Internal Medicine)  Extended Emergency Contact Information Primary Emergency Contact: [facility] Address: [address]          [place], [place] [address] Johnnette Litter of [place] Phone: [phone] Relation: Daughter Secondary Emergency Contact: Regana, [place], [place] [address] Johnnette Litter of [place] Phone: [phone] Relation: Son  Code Status:  DNR Goals of care: Advanced Directive information Advanced Directives 01/27/2017  Does Patient Have a Medical Advance Directive? Yes  Type of Advance Directive Out of facility DNR (pink MOST or yellow form)  Does patient want to make changes to medical advance directive? No - Patient declined  Copy of [place] in Chart? -  Pre-existing out of facility DNR order (yellow form or pink MOST form) -     Chief Complaint  Patient presents with  . Medical Management of Chronic Issues    HPI:  Pt is a 83 y.o. female seen today for medical management of chronic diseases.      The patient has history of Hypothyroidism, taking Levothyroxine 160mcg qd since 01/01/17, last TSH 9.52 12/31/16, f/u TSH 8 weeks scheduled. GERD, stable, taking Famotidine 10mg  qd since 12/26/16, Omeprazole 20mg  qd.  Dementia: stable, resides in SNF for care assistance, taking Memantine 28mg  to preserve memory. HTN blood pressure is controlled while on Labetalol 100mg  bid.    Past Medical History:  Diagnosis Date  . Acute blood loss anemia 03/29/2014   03/30/14 Hgb 7.3 04/04/14 Hgb 8.5 continue Fe bid.  05/04/14 Hgb 10.8   . Benign neoplasm of colon   . Cancer ([place])    pre-melanoma  . Cystocele   . Dementia of the Alzheimer's type 05/22/2014   05/19/14 MMSE 18/30 Namenda.    . Diastolic dysfunction 1/63/8466  . Diverticulosis of colon (without mention of hemorrhage)   . Diverticulosis of large intestine 06/28/2004  . Esophageal reflux   . Essential hypertension 07/05/2013  . Gastroparesis   . Hemorrhoids   . HTN (hypertension)   . Hyperlipidemia 07/05/2013  . Hypertension   . Hypothyroidism   . Internal hemorrhoids without mention of complication 59/12/3568  . Macular degeneration   . Nonspecific abnormal electrocardiogram (ECG) (EKG)   . Osteoarthritis    right hip  . Other and unspecified hyperlipidemia   . Prediabetes 11/17/2013  . Primary osteoarthritis of right hip 03/27/2014  . Shingles   . Skin cancer (melanoma) ([place])   . Stricture and stenosis of esophagus   . Stricture and stenosis of esophagus 10/27/2007  . Vitamin D deficiency 11/17/2013   Past Surgical History:  Procedure Laterality Date  . APPENDECTOMY    . BCC resection     legs/face  . CATARACT EXTRACTION, BILATERAL    . CHOLECYSTECTOMY    . FLEXIBLE SIGMOIDOSCOPY  05/12/2011   Procedure: FLEXIBLE SIGMOIDOSCOPY;  Surgeon: Inda Castle, MD;  Location: [facility];  Service: Endoscopy;  Laterality: N/A;  . FLEXIBLE SIGMOIDOSCOPY  09/10/2011   Procedure: FLEXIBLE SIGMOIDOSCOPY;  Surgeon: Inda Castle, MD;  Location: [facility];  Service: Endoscopy;  Laterality: N/A;  . FLEXIBLE SIGMOIDOSCOPY N/A 08/16/2012   Procedure: FLEXIBLE SIGMOIDOSCOPY;  Surgeon: Inda Castle, MD;  Location: [facility];  Service: Endoscopy;  Laterality: N/A;  . FRACTURE SURGERY     left hip pin  . GALLBLADDER SURGERY  2002  . HEMORRHOID BANDING N/A 08/16/2012   Procedure: HEMORRHOID BANDING;  Surgeon:  Inda Castle, MD;  [facility];  Service: Endoscopy;  Laterality: N/A;  . HEMORRHOID SURGERY    . left eye surgery    . melenoma resection     right arm  . THYROIDECTOMY    . TONSILLECTOMY    . TOTAL HIP ARTHROPLASTY Right 03/27/2014   Procedure: TOTAL HIP ARTHROPLASTY ANTERIOR APPROACH;  Surgeon: Alta Corning, MD;  Location: [facility];  Service: Orthopedics;  Laterality: Right;    Allergies  Allergen Reactions  . Ace Inhibitors Other (See Comments)    Elevated potassium  . Statins Other (See Comments)    Myalgias   . Vicodin [Hydrocodone-Acetaminophen] Other (See Comments)    "felt spaced out"  . Fosamax [Alendronate Sodium] Other (See Comments)  . Metoclopramide Hcl Other (See Comments)  . Sulfa Antibiotics Rash  . Sulfonamide Derivatives Rash  . Vesicare [Solifenacin] Other (See Comments)    Outpatient Encounter Prescriptions as of 01/27/2017  Medication Sig  . acetaminophen (TYLENOL) 500 MG tablet Take 1,000 mg by mouth 3 (three) times daily as needed for mild pain or moderate pain.  Marland Kitchen aspirin EC 81 MG tablet Take 81 mg by mouth daily.  . calcium carbonate (CAL-GEST ANTACID) 500 MG chewable tablet Chew 1 tablet by mouth daily as needed for indigestion or heartburn.  . Cholecalciferol (VITAMIN D) 2000 UNITS tablet Take 4,000 Units by mouth daily.  . famotidine (PEPCID) 10 MG tablet Take 10 mg by mouth at bedtime.  Marland Kitchen guaiFENesin (ROBITUSSIN) 100 MG/5ML SOLN Take 10 mLs by mouth every 6 (six) hours as needed for cough or to loosen phlegm.  . labetalol (NORMODYNE) 100 MG tablet Take 100 mg by mouth 2 (two) times daily.  Marland Kitchen levothyroxine (SYNTHROID, LEVOTHROID) 100 MCG tablet Take 100 mcg by mouth daily before breakfast.  . memantine (NAMENDA XR) 28 MG CP24 24 hr capsule Take 28 mg by mouth daily.  . Multiple Vitamins-Minerals (PRESERVISION AREDS 2) CAPS Take 1 capsule by mouth 2 (two) times daily.  Marland Kitchen omeprazole (PRILOSEC) 20 MG capsule Take 20 mg by mouth daily.  .  polyethylene glycol (MIRALAX / GLYCOLAX) packet Take 17 g by mouth every other day.   . protein supplement (RESOURCE BENEPROTEIN) POWD Take 1 scoop by mouth 2 (two) times daily.   . sennosides-docusate sodium (SENOKOT-S) 8.6-50 MG tablet Take 2 tablets by mouth at bedtime.    No facility-administered encounter medications on file as of 01/27/2017.    ROS was provided with assistance of staff Review of Systems  Constitutional: Negative for activity change, appetite change, chills, diaphoresis, fatigue and fever.  HENT: Positive for hearing loss. Negative for congestion.   Eyes: Negative for visual disturbance.  Respiratory: Negative for cough, choking, chest tightness and shortness of breath.   Cardiovascular: Negative for palpitations and leg swelling.  Gastrointestinal: Negative for abdominal distention and abdominal pain.  Endocrine: Negative for cold intolerance and heat intolerance.  Genitourinary: Negative for dysuria.  Musculoskeletal: Positive for gait problem. Negative for joint swelling.       1x assist for transfer, SBA for ambulation with  walker.   Skin: Negative for pallor, rash and wound.  Neurological: Negative for dizziness, tremors and speech difficulty.  Psychiatric/Behavioral: Positive for confusion. Negative for agitation, behavioral problems, hallucinations and sleep disturbance. The patient is not nervous/anxious.     Immunization History  Administered Date(s) Administered  . Influenza, High Dose Seasonal PF 12/27/2013  . Influenza-Unspecified 01/12/2013, 01/02/2015, 01/29/2016  . Pneumococcal-Unspecified 07/01/2012  . Td 05/27/2004  . Tdap 02/21/2012  . Zoster 04/15/2007   Pertinent  Health Maintenance Due  Topic Date Due  . INFLUENZA VACCINE  11/12/2016  . PNA vac Low Risk Adult (2 of 2 - PCV13) 04/14/2017 (Originally 07/01/2013)  . DEXA SCAN  Completed   Fall Risk  11/28/2016 05/22/2014 10/06/2013 06/02/2013  Falls in the past year? No No No No  Risk for fall  due to : - - History of fall(s);Impaired balance/gait;Impaired mobility Impaired mobility   Functional Status Survey:    Vitals:   01/27/17 1200  BP: 120/62  Pulse: 72  Resp: 18  Temp: 98.1 F (36.7 C)  SpO2: 97%  Weight: 137 lb 9.6 oz (62.4 kg)  Height: 5\' 1"  (1.549 m)   Body mass index is 26 kg/m. Physical Exam  Constitutional: She appears well-developed and well-nourished. No distress.  HENT:  Head: Normocephalic and atraumatic.  Eyes: Pupils are equal, round, and reactive to light. Conjunctivae and EOM are normal.  Neck: Normal range of motion. Neck supple. No JVD present. No thyromegaly present.  Cardiovascular: Normal rate and regular rhythm.   No murmur heard. Pulmonary/Chest: Effort normal and breath sounds normal. She has no wheezes. She has no rales.  Abdominal: Soft. Bowel sounds are normal. She exhibits no distension.  Musculoskeletal: Normal range of motion. She exhibits no edema or tenderness.  Neurological: She is alert.  Oriented to person and her room  Skin: Skin is warm and dry. No rash noted. She is not diaphoretic. No erythema.  Psychiatric: She has a normal mood and affect. Her behavior is normal.    Labs reviewed:  Recent Labs  08/19/16 09/16/16 11/21/16  NA 141 142 142  K 3.5 3.6 4.1  BUN 22* 23* 21  CREATININE 0.8 0.7 0.8   No results for input(s): AST, ALT, ALKPHOS, BILITOT, PROT, ALBUMIN in the last 8760 hours.  Recent Labs  03/18/16 07/15/16 09/16/16  WBC 8.1 7.4 8.3  HGB 11.4* 12.1 12.5  HCT 35* 36 37  PLT 225 265 248   Lab Results  Component Value Date   TSH 9.52 (A) 12/30/2016   Lab Results  Component Value Date   HGBA1C 5.8 (H) 07/06/2014   Lab Results  Component Value Date   CHOL 228 (A) 07/15/2016   HDL 55 07/15/2016   LDLCALC 146 07/15/2016   TRIG 141 07/15/2016   CHOLHDL 3.2 07/06/2014    Significant Diagnostic Results in last 30 days:  No results found.  Assessment/Plan Essential hypertension Controlled,  continue Labetalol 100mg  bid po  Chronic diastolic heart failure ([place]) Compensated clinically, last BNP 196 11/22/16  GERD Stable, continue Famotidine 10mg  daily, Omeprazole 20mg  qd  Hypothyroidism taking Levothyroxine 151mcg qd since 01/01/17, last TSH 9.52 12/31/16, f/u TSH 8 weeks scheduled.  Dementia of the Alzheimer's type stable, resides in SNF for care assistance, taking Memantine 28mg  to preserve memory.     Family/ staff Communication: plan of care reviewed with the patient and charge nurse  Labs/tests ordered:  none  Time spend 25 minutes

## 2017-01-27 NOTE — Assessment & Plan Note (Signed)
taking Levothyroxine 172mcg qd since 01/01/17, last TSH 9.52 12/31/16, f/u TSH 8 weeks scheduled.

## 2017-01-27 NOTE — Assessment & Plan Note (Signed)
Stable, continue Famotidine 10mg  daily, Omeprazole 20mg  qd

## 2017-01-27 NOTE — Assessment & Plan Note (Signed)
Controlled, continue Labetalol 100mg  bid po

## 2017-01-27 NOTE — Assessment & Plan Note (Signed)
Compensated clinically, last BNP 196 11/22/16

## 2017-01-27 NOTE — Assessment & Plan Note (Signed)
stable, resides in SNF for care assistance, taking Memantine 28mg  to preserve memory.

## 2017-02-10 ENCOUNTER — Encounter: Payer: Self-pay | Admitting: Nurse Practitioner

## 2017-02-10 ENCOUNTER — Non-Acute Institutional Stay (SKILLED_NURSING_FACILITY): Payer: Medicare Other | Admitting: Nurse Practitioner

## 2017-02-10 DIAGNOSIS — E039 Hypothyroidism, unspecified: Secondary | ICD-10-CM

## 2017-02-10 DIAGNOSIS — F0280 Dementia in other diseases classified elsewhere without behavioral disturbance: Secondary | ICD-10-CM

## 2017-02-10 DIAGNOSIS — G309 Alzheimer's disease, unspecified: Secondary | ICD-10-CM

## 2017-02-10 NOTE — Assessment & Plan Note (Signed)
increased Levothyroxine 163mcg qd since 01/01/17, last TSH 9.52 12/31/16, pending f/u TSH 8 weeks from 01/01/17.

## 2017-02-10 NOTE — Assessment & Plan Note (Signed)
The patient is reported more confused, she resides in SNF for care assistance, taking Memantine 28mg  to preserve memory, last MMSE 18/30 11/28/16, will update MMSE, CBC, CMP

## 2017-02-10 NOTE — Progress Notes (Signed)
Location:  [facility] Room Number: 34 Place of Service:  SNF (31) Provider:  Zariel Capano, Manxie  NP  Blanchie Serve, MD  Patient Care Team: Blanchie Serve, MD as PCP - General (Internal Medicine) Zadie Rhine Clent Demark, MD as Consulting Physician (Ophthalmology) Burnell Blanks, MD as Consulting Physician (Cardiology) Inda Castle, MD as Consulting Physician (Gastroenterology) Jari Pigg, MD as Consulting Physician (Dermatology) Dorna Leitz, MD as Consulting Physician (Orthopedic Surgery) Alecxis Baltzell X, NP as Nurse Practitioner (Internal Medicine)  Extended Emergency Contact Information Primary Emergency Contact: [facility] Address: [address]          [place], [place] [address] Johnnette Litter of [place] Phone: [phone] Relation: Daughter Secondary Emergency Contact: Danniella, [place], [place] [address] Johnnette Litter of [place] Phone: [phone] Relation: Son  Code Status:  DNR Goals of care: Advanced Directive information Advanced Directives 02/10/2017  Does Patient Have a Medical Advance Directive? Yes  Type of Advance Directive Out of facility DNR (pink MOST or yellow form)  Does patient want to make changes to medical advance directive? No - Patient declined  Copy of [place] in Chart? -  Pre-existing out of facility DNR order (yellow form or pink MOST form) Pink MOST form placed in chart (order not valid for inpatient use);Yellow form placed in chart (order not valid for inpatient use)     Chief Complaint  Patient presents with  . Acute Visit    increased confusion    HPI:  Pt is a 83 y.o. female seen today for an acute visit for staff reported the patient has increased confusion more than normal. The patient has history of dementia, taking Memantine 28mg  qd, Hypothyroidism, taking Levothyroxine 134mcg qd since 01/01/17, last TSH 9.52 12/31/16, pending f/u TSH 8 weeks from 01/01/17. She is afebrile, no  noted cough, sputum production, nausea, vomiting, diarrhea, constipation, dysuria, or focal weakness.   Past Medical History:  Diagnosis Date  . Acute blood loss anemia 03/29/2014   03/30/14 Hgb 7.3 04/04/14 Hgb 8.5 continue Fe bid.  05/04/14 Hgb 10.8   . Benign neoplasm of colon   . Cancer ([place])    pre-melanoma  . Cystocele   . Dementia of the Alzheimer's type 05/22/2014   05/19/14 MMSE 18/30 Namenda.    . Diastolic dysfunction 6/76/7209  . Diverticulosis of colon (without mention of hemorrhage)   . Diverticulosis of large intestine 06/28/2004  . Esophageal reflux   . Essential hypertension 07/05/2013  . Gastroparesis   . Hemorrhoids   . HTN (hypertension)   . Hyperlipidemia 07/05/2013  . Hypertension   . Hypothyroidism   . Internal hemorrhoids without mention of complication 47/0/9628  . Macular degeneration   . Nonspecific abnormal electrocardiogram (ECG) (EKG)   . Osteoarthritis    right hip  . Other and unspecified hyperlipidemia   . Prediabetes 11/17/2013  . Primary osteoarthritis of right hip 03/27/2014  . Shingles   . Skin cancer (melanoma) ([facility])   . Stricture and stenosis of esophagus   . Stricture and stenosis of esophagus 10/27/2007  . Vitamin D deficiency 11/17/2013   Past Surgical History:  Procedure Laterality Date  . APPENDECTOMY    . BCC resection     legs/face  . CATARACT EXTRACTION, BILATERAL    . CHOLECYSTECTOMY    . FLEXIBLE SIGMOIDOSCOPY  05/12/2011   Procedure: FLEXIBLE SIGMOIDOSCOPY;  Surgeon: Inda Castle, MD;  [facility];  Service: Endoscopy;  Laterality: N/A;  . FLEXIBLE SIGMOIDOSCOPY  09/10/2011   Procedure: FLEXIBLE SIGMOIDOSCOPY;  Surgeon: Inda Castle, MD;  [facility];  Service: Endoscopy;  Laterality: N/A;  . FLEXIBLE SIGMOIDOSCOPY N/A 08/16/2012   Procedure: FLEXIBLE SIGMOIDOSCOPY;  Surgeon: Inda Castle, MD;  [facility];  Service: Endoscopy;  Laterality: N/A;  . FRACTURE SURGERY     left hip pin  .  GALLBLADDER SURGERY  2002  . HEMORRHOID BANDING N/A 08/16/2012   Procedure: HEMORRHOID BANDING;  Surgeon: Inda Castle, MD;  [facility];  Service: Endoscopy;  Laterality: N/A;  . HEMORRHOID SURGERY    . left eye surgery    . melenoma resection     right arm  . THYROIDECTOMY    . TONSILLECTOMY    . TOTAL HIP ARTHROPLASTY Right 03/27/2014   Procedure: TOTAL HIP ARTHROPLASTY ANTERIOR APPROACH;  Surgeon: Alta Corning, MD;  Location: [facility];  Service: Orthopedics;  Laterality: Right;    Allergies  Allergen Reactions  . Ace Inhibitors Other (See Comments)    Elevated potassium  . Statins Other (See Comments)    Myalgias   . Vicodin [Hydrocodone-Acetaminophen] Other (See Comments)    "felt spaced out"  . Fosamax [Alendronate Sodium] Other (See Comments)  . Metoclopramide Hcl Other (See Comments)  . Sulfa Antibiotics Rash  . Sulfonamide Derivatives Rash  . Vesicare [Solifenacin] Other (See Comments)    Outpatient Encounter Prescriptions as of 02/10/2017  Medication Sig  . acetaminophen (TYLENOL) 500 MG tablet Take 1,000 mg by mouth 3 (three) times daily as needed for mild pain or moderate pain.  Marland Kitchen aspirin EC 81 MG tablet Take 81 mg by mouth daily.  . calcium carbonate (CAL-GEST ANTACID) 500 MG chewable tablet Chew 1 tablet by mouth daily as needed for indigestion or heartburn.  . Cholecalciferol (VITAMIN D) 2000 UNITS tablet Take 4,000 Units by mouth daily.  . famotidine (PEPCID) 10 MG tablet Take 10 mg by mouth at bedtime.  Marland Kitchen guaiFENesin (ROBITUSSIN) 100 MG/5ML SOLN Take 10 mLs by mouth every 6 (six) hours as needed for cough or to loosen phlegm.  . labetalol (NORMODYNE) 100 MG tablet Take 100 mg by mouth 2 (two) times daily.  Marland Kitchen levothyroxine (SYNTHROID, LEVOTHROID) 100 MCG tablet Take 100 mcg by mouth daily before breakfast.  . memantine (NAMENDA XR) 28 MG CP24 24 hr capsule Take 28 mg by mouth daily.  . Multiple Vitamins-Minerals (PRESERVISION AREDS 2) CAPS Take 1  capsule by mouth 2 (two) times daily.  Marland Kitchen omeprazole (PRILOSEC) 20 MG capsule Take 20 mg by mouth daily.  . polyethylene glycol (MIRALAX / GLYCOLAX) packet Take 17 g by mouth every other day.   . protein supplement (RESOURCE BENEPROTEIN) POWD Take 1 scoop by mouth 2 (two) times daily.   . sennosides-docusate sodium (SENOKOT-S) 8.6-50 MG tablet Take 2 tablets by mouth at bedtime.    No facility-administered encounter medications on file as of 02/10/2017.    ROS is provided with assistance of staff Review of Systems  Constitutional: Negative for activity change, appetite change, chills, diaphoresis, fatigue and fever.  HENT: Positive for hearing loss. Negative for congestion, trouble swallowing and voice change.   Eyes: Negative for visual disturbance.  Respiratory: Negative for cough, choking, chest tightness, shortness of breath and wheezing.   Cardiovascular: Negative for chest pain, palpitations and leg swelling.  Gastrointestinal: Negative for abdominal distention, abdominal pain, constipation, diarrhea, nausea and vomiting.  Endocrine: Negative for cold intolerance.  Genitourinary: Negative for difficulty urinating  and dysuria.  Musculoskeletal: Positive for gait problem.       Ambulates with walker for a few steps, wheelchair to go further.   Skin: Negative for color change, pallor and rash.  Neurological: Negative for speech difficulty, weakness and headaches.       Dementia  Psychiatric/Behavioral: Positive for confusion. Negative for agitation, behavioral problems and sleep disturbance. The patient is not nervous/anxious.     Immunization History  Administered Date(s) Administered  . Influenza, High Dose Seasonal PF 12/27/2013  . Influenza-Unspecified 01/12/2013, 01/02/2015, 01/29/2016  . Pneumococcal-Unspecified 07/01/2012  . Td 05/27/2004  . Tdap 02/21/2012  . Zoster 04/15/2007   Pertinent  Health Maintenance Due  Topic Date Due  . INFLUENZA VACCINE  11/12/2016  . PNA  vac Low Risk Adult (2 of 2 - PCV13) 04/14/2017 (Originally 07/01/2013)  . DEXA SCAN  Completed   Fall Risk  11/28/2016 05/22/2014 10/06/2013 06/02/2013  Falls in the past year? No No No No  Risk for fall due to : - - History of fall(s);Impaired balance/gait;Impaired mobility Impaired mobility   Functional Status Survey:    Vitals:   02/10/17 1302  BP: (!) 158/90  Pulse: 76  Resp: 20  Temp: 98.3 F (36.8 C)  SpO2: 96%  Weight: 137 lb 9.6 oz (62.4 kg)  Height: 5\' 1"  (1.549 m)   Body mass index is 26 kg/m. Physical Exam  Constitutional: She appears well-developed and well-nourished.  HENT:  Head: Normocephalic.  Eyes: Pupils are equal, round, and reactive to light. Conjunctivae and EOM are normal.  Neck: Normal range of motion. Neck supple. No JVD present. No thyromegaly present.  Cardiovascular: Normal rate, regular rhythm and normal heart sounds.   Pulmonary/Chest: Effort normal and breath sounds normal. She has no wheezes. She has no rales.  Abdominal: Soft. Bowel sounds are normal. She exhibits no distension. There is no tenderness. There is no rebound.  Musculoskeletal: Normal range of motion. She exhibits no edema or tenderness.  Neurological: She is alert.  Oriented to self.   Skin: Skin is warm and dry.  Psychiatric: She has a normal mood and affect. Her behavior is normal.    Labs reviewed:  Recent Labs  08/19/16 09/16/16 11/21/16  NA 141 142 142  K 3.5 3.6 4.1  BUN 22* 23* 21  CREATININE 0.8 0.7 0.8   No results for input(s): AST, ALT, ALKPHOS, BILITOT, PROT, ALBUMIN in the last 8760 hours.  Recent Labs  03/18/16 07/15/16 09/16/16  WBC 8.1 7.4 8.3  HGB 11.4* 12.1 12.5  HCT 35* 36 37  PLT 225 265 248   Lab Results  Component Value Date   TSH 9.52 (A) 12/30/2016   Lab Results  Component Value Date   HGBA1C 5.8 (H) 07/06/2014   Lab Results  Component Value Date   CHOL 228 (A) 07/15/2016   HDL 55 07/15/2016   LDLCALC 146 07/15/2016   TRIG 141  07/15/2016   CHOLHDL 3.2 07/06/2014    Significant Diagnostic Results in last 30 days:  No results found.  Assessment/Plan Dementia of the Alzheimer's type The patient is reported more confused, she resides in SNF for care assistance, taking Memantine 28mg  to preserve memory, last MMSE 18/30 11/28/16, will update MMSE, CBC, CMP  Hypothyroidism  increased Levothyroxine 181mcg qd since 01/01/17, last TSH 9.52 12/31/16, pending f/u TSH 8 weeks from 01/01/17.       Family/ staff Communication: plan of care reviewed with the patient and charge nurse.   Labs/tests ordered:  CBC CMP MMSE  Time spend 25 minutes.

## 2017-02-11 ENCOUNTER — Other Ambulatory Visit: Payer: Self-pay | Admitting: *Deleted

## 2017-02-11 DIAGNOSIS — I5030 Unspecified diastolic (congestive) heart failure: Secondary | ICD-10-CM | POA: Diagnosis not present

## 2017-02-11 DIAGNOSIS — M6281 Muscle weakness (generalized): Secondary | ICD-10-CM | POA: Diagnosis not present

## 2017-02-11 LAB — HEPATIC FUNCTION PANEL
ALT: 8 (ref 7–35)
AST: 13 (ref 13–35)
Alkaline Phosphatase: 75 (ref 25–125)
BILIRUBIN, TOTAL: 0.7

## 2017-02-11 LAB — CBC AND DIFFERENTIAL
HEMATOCRIT: 36 (ref 36–46)
HEMOGLOBIN: 12 (ref 12.0–16.0)
PLATELETS: 255 (ref 150–399)
WBC: 8.9

## 2017-02-11 LAB — BASIC METABOLIC PANEL
BUN: 21 (ref 4–21)
Creatinine: 0.8 (ref <=1.1)
GLUCOSE: 90
Potassium: 3.4 (ref 3.4–5.3)
Sodium: 142 (ref 137–147)

## 2017-02-12 ENCOUNTER — Encounter: Payer: Self-pay | Admitting: *Deleted

## 2017-02-26 DIAGNOSIS — E039 Hypothyroidism, unspecified: Secondary | ICD-10-CM | POA: Diagnosis not present

## 2017-02-26 LAB — TSH: TSH: 0.7 (ref <=5.90)

## 2017-02-27 ENCOUNTER — Other Ambulatory Visit: Payer: Self-pay | Admitting: *Deleted

## 2017-03-12 ENCOUNTER — Encounter: Payer: Self-pay | Admitting: Internal Medicine

## 2017-03-13 ENCOUNTER — Non-Acute Institutional Stay (SKILLED_NURSING_FACILITY): Payer: Medicare Other | Admitting: Internal Medicine

## 2017-03-13 ENCOUNTER — Encounter: Payer: Self-pay | Admitting: Internal Medicine

## 2017-03-13 DIAGNOSIS — I10 Essential (primary) hypertension: Secondary | ICD-10-CM

## 2017-03-13 DIAGNOSIS — G309 Alzheimer's disease, unspecified: Secondary | ICD-10-CM | POA: Diagnosis not present

## 2017-03-13 DIAGNOSIS — M1611 Unilateral primary osteoarthritis, right hip: Secondary | ICD-10-CM

## 2017-03-13 DIAGNOSIS — E039 Hypothyroidism, unspecified: Secondary | ICD-10-CM

## 2017-03-13 DIAGNOSIS — F0280 Dementia in other diseases classified elsewhere without behavioral disturbance: Secondary | ICD-10-CM

## 2017-03-13 DIAGNOSIS — K219 Gastro-esophageal reflux disease without esophagitis: Secondary | ICD-10-CM | POA: Diagnosis not present

## 2017-03-13 NOTE — Progress Notes (Signed)
Location:  [facility] Room Number: 93 Place of Service:  SNF [phone]) Provider:  Blanchie Serve MD  Blanchie Serve, MD  Patient Care Team: Blanchie Serve, MD as PCP - General (Internal Medicine) Zadie Rhine Clent Demark, MD as Consulting Physician (Ophthalmology) Burnell Blanks, MD as Consulting Physician (Cardiology) Inda Castle, MD as Consulting Physician (Gastroenterology) Jari Pigg, MD as Consulting Physician (Dermatology) Dorna Leitz, MD as Consulting Physician (Orthopedic Surgery) Mast, Man X, NP as Nurse Practitioner (Internal Medicine)  Extended Emergency Contact Information Primary Emergency Contact: [facility] Address: [address]          [place], [place] [address] Johnnette Litter of [place] Phone: [phone] Relation: Daughter Secondary Emergency Contact: [place], [place],  [address] Johnnette Litter of [place] Phone: [phone] Relation: Son  Code Status:  DNR  Goals of care: Advanced Directive information Advanced Directives 03/13/2017  Does Patient Have a Medical Advance Directive? Yes  Type of Advance Directive Out of facility DNR (pink MOST or yellow form)  Does patient want to make changes to medical advance directive? No - Patient declined  Copy of [place] in Chart? -  Pre-existing out of facility DNR order (yellow form or pink MOST form) Pink MOST form placed in chart (order not valid for inpatient use);Yellow form placed in chart (order not valid for inpatient use)     Chief Complaint  Patient presents with  . Medical Management of Chronic Issues    HPI:  Pt is a 83 y.o. female seen today for medical management of chronic diseases. She is pleasantly confused. She denies any health concern this visit. No new concern from nursing.    Past Medical History:  Diagnosis Date  . Acute blood loss anemia 03/29/2014   03/30/14 Hgb 7.3 04/04/14 Hgb 8.5 continue Fe bid.  05/04/14 Hgb  10.8   . Benign neoplasm of colon   . Cancer ([place])    pre-melanoma  . Cystocele   . Dementia of the Alzheimer's type 05/22/2014   05/19/14 MMSE 18/30 [place].    . Diastolic dysfunction 9/67/8938  . Diverticulosis of colon (without mention of hemorrhage)   . Diverticulosis of large intestine 06/28/2004  . Esophageal reflux   . Essential hypertension 07/05/2013  . Gastroparesis   . Hemorrhoids   . HTN (hypertension)   . Hyperlipidemia 07/05/2013  . Hypertension   . Hypothyroidism   . Internal hemorrhoids without mention of complication 01/12/7509  . Macular degeneration   . Nonspecific abnormal electrocardiogram (ECG) (EKG)   . Osteoarthritis    right hip  . Other and unspecified hyperlipidemia   . Prediabetes 11/17/2013  . Primary osteoarthritis of right hip 03/27/2014  . Shingles   . Skin cancer (melanoma) ([place])   . Stricture and stenosis of esophagus   . Stricture and stenosis of esophagus 10/27/2007  . Vitamin D deficiency 11/17/2013   Past Surgical History:  Procedure Laterality Date  . APPENDECTOMY    . BCC resection     legs/face  . CATARACT EXTRACTION, BILATERAL    . CHOLECYSTECTOMY    . FLEXIBLE SIGMOIDOSCOPY  05/12/2011   Procedure: FLEXIBLE SIGMOIDOSCOPY;  Surgeon: Inda Castle, MD;  Location: [facility];  Service: Endoscopy;  Laterality: N/A;  . FLEXIBLE SIGMOIDOSCOPY  09/10/2011   Procedure: FLEXIBLE SIGMOIDOSCOPY;  Surgeon: Inda Castle, MD;  Location: [facility];  Service: Endoscopy;  Laterality: N/A;  . FLEXIBLE SIGMOIDOSCOPY N/A 08/16/2012  Procedure: FLEXIBLE SIGMOIDOSCOPY;  Surgeon: Inda Castle, MD;  Location: [facility];  Service: Endoscopy;  Laterality: N/A;  . FRACTURE SURGERY     left hip pin  . GALLBLADDER SURGERY  2002  . HEMORRHOID BANDING N/A 08/16/2012   Procedure: HEMORRHOID BANDING;  Surgeon: Inda Castle, MD;  Location: [facility];  Service: Endoscopy;  Laterality: N/A;  . HEMORRHOID SURGERY    . left eye surgery    . melenoma  resection     right arm  . THYROIDECTOMY    . TONSILLECTOMY    . TOTAL HIP ARTHROPLASTY Right 03/27/2014   Procedure: TOTAL HIP ARTHROPLASTY ANTERIOR APPROACH;  Surgeon: Alta Corning, MD;  Location: [facility];  Service: Orthopedics;  Laterality: Right;    Allergies  Allergen Reactions  . Ace Inhibitors Other (See Comments)    Elevated potassium  . Statins Other (See Comments)    Myalgias   . Vicodin [Hydrocodone-Acetaminophen] Other (See Comments)    "felt spaced out"  . Fosamax [Alendronate Sodium] Other (See Comments)  . Metoclopramide Hcl Other (See Comments)  . Sulfa Antibiotics Rash  . Sulfonamide Derivatives Rash  . Vesicare [Solifenacin] Other (See Comments)    Outpatient Encounter Medications as of 03/13/2017  Medication Sig  . acetaminophen (TYLENOL) 500 MG tablet Take 1,000 mg by mouth 3 (three) times daily as needed for mild pain or moderate pain.  Marland Kitchen aspirin EC 81 MG tablet Take 81 mg by mouth daily.  . calcium carbonate (CAL-GEST ANTACID) 500 MG chewable tablet Chew 1 tablet by mouth daily as needed for indigestion or heartburn.  . Cholecalciferol (VITAMIN D) 2000 UNITS tablet Take 4,000 Units by mouth daily.  . famotidine (PEPCID) 10 MG tablet Take 10 mg by mouth at bedtime.  Marland Kitchen guaiFENesin (ROBITUSSIN) 100 MG/5ML SOLN Take 10 mLs by mouth every 6 (six) hours as needed for cough or to loosen phlegm.  . labetalol (NORMODYNE) 100 MG tablet Take 100 mg by mouth 2 (two) times daily.  Marland Kitchen levothyroxine (SYNTHROID, LEVOTHROID) 100 MCG tablet Take 100 mcg by mouth daily before breakfast.  . memantine (NAMENDA XR) 28 MG CP24 24 hr capsule Take 28 mg by mouth daily.  . Multiple Vitamins-Minerals (PRESERVISION AREDS 2) CAPS Take 1 capsule by mouth 2 (two) times daily.  Marland Kitchen omeprazole (PRILOSEC) 20 MG capsule Take 20 mg by mouth daily.  . polyethylene glycol (MIRALAX / GLYCOLAX) packet Take 17 g by mouth every other day.   . protein supplement (RESOURCE BENEPROTEIN) POWD Take 1 scoop  by mouth 2 (two) times daily.   . sennosides-docusate sodium (SENOKOT-S) 8.6-50 MG tablet Take 2 tablets by mouth at bedtime.    No facility-administered encounter medications on file as of 03/13/2017.     Review of Systems  Constitutional: Negative for appetite change and fever.  HENT: Positive for hearing loss. Negative for congestion and trouble swallowing.        Hearing aids present   Respiratory: Negative for cough and shortness of breath.   Cardiovascular: Negative for chest pain and palpitations.  Gastrointestinal: Negative for abdominal pain, diarrhea and vomiting.  Genitourinary: Negative for dysuria.  Musculoskeletal: Positive for arthralgias and gait problem. Negative for back pain.  Skin: Negative for rash and wound.  Neurological: Negative for dizziness, seizures and headaches.  Hematological: Bruises/bleeds easily.  Psychiatric/Behavioral: Positive for confusion. Negative for behavioral problems.    Immunization History  Administered Date(s) Administered  . Influenza, High Dose Seasonal PF 12/27/2013  . Influenza-Unspecified 01/12/2013, 01/02/2015, 01/29/2016,  02/03/2017  . Pneumococcal-Unspecified 07/01/2012  . Td 05/27/2004  . Tdap 02/21/2012  . Zoster 04/15/2007   Pertinent  Health Maintenance Due  Topic Date Due  . PNA vac Low Risk Adult (2 of 2 - PCV13) 04/14/2017 (Originally 07/01/2013)  . INFLUENZA VACCINE  Completed  . DEXA SCAN  Completed   Fall Risk  11/28/2016 05/22/2014 10/06/2013 06/02/2013  Falls in the past year? No No No No  Risk for fall due to : - - History of fall(s);Impaired balance/gait;Impaired mobility Impaired mobility   Functional Status Survey:    Vitals:   03/13/17 0751  BP: 139/78  Pulse: 70  Resp: 20  Temp: 98.5 F (36.9 C)  Weight: 138 lb (62.6 kg)  Height: 5\' 1"  (1.549 m)   Body mass index is 26.07 kg/m.   Wt Readings from Last 3 Encounters:  03/13/17 138 lb (62.6 kg)  03/12/17 138 lb (62.6 kg)  02/10/17 137 lb 9.6 oz  (62.4 kg)   Physical Exam  Constitutional: She appears well-developed and well-nourished. No distress.  HENT:  Head: Normocephalic and atraumatic.  Mouth/Throat: Oropharynx is clear and moist. No oropharyngeal exudate.  Hearing aids  Eyes: Conjunctivae and EOM are normal. Pupils are equal, round, and reactive to light. Right eye exhibits no discharge. Left eye exhibits no discharge.  Ectropion present  Neck: Neck supple.  Cardiovascular: Normal rate and regular rhythm.  Pulmonary/Chest: Effort normal and breath sounds normal. No respiratory distress. She has no wheezes. She has no rales.  Abdominal: Soft. Bowel sounds are normal. There is no tenderness.  Musculoskeletal: Normal range of motion. She exhibits deformity. She exhibits no edema or tenderness.  Arthritis changes to her fingers. Uses walker to ambulate. Unsteady gait.   Lymphadenopathy:    She has no cervical adenopathy.  Neurological: She is alert.  Oriented to self and place.  Skin: Skin is warm and dry. No rash noted. She is not diaphoretic.  Easy bruising  Psychiatric: She has a normal mood and affect. Her behavior is normal.    Labs reviewed: Recent Labs    09/16/16 11/21/16 02/11/17  NA 142 142 142  K 3.6 4.1 3.4  BUN 23* 21 21  CREATININE 0.7 0.8 0.8   Recent Labs    02/11/17  AST 13  ALT 8  ALKPHOS 75   Recent Labs    07/15/16 09/16/16 02/11/17  WBC 7.4 8.3 8.9  HGB 12.1 12.5 12.0  HCT 36 37 36  PLT 265 248 255   Lab Results  Component Value Date   TSH 0.70 02/26/2017   Lab Results  Component Value Date   HGBA1C 5.8 (H) 07/06/2014   Lab Results  Component Value Date   CHOL 228 (A) 07/15/2016   HDL 55 07/15/2016   LDLCALC 146 07/15/2016   TRIG 141 07/15/2016   CHOLHDL 3.2 07/06/2014    Significant Diagnostic Results in last 30 days:  No results found.  Assessment/Plan  OA Currently on tylenol 1000 mg tid prn with calcium and vitamin D supplement. On chart review, has not required  any tylenol in last 2 weeks. Change this to 500 mg q8h prn pain and monitor.   Dementia without behavioral disturbance Supportive care and redirections. Decline anticipated. Continue memantine 28 mg daily. MMSE 10/31 16/30.   gerd Denies any symptom. Continue omeprazole. D/c famotidine. Has history of gi bleed and gastroparesis.   Hypothyroidism Lab Results  Component Value Date   TSH 0.70 02/26/2017   Stable. Continue levothyroxine 100  mcg daily. Her dosing was increased in 12/2016 from 75 mcg. Check tsh periodically.   Essential hypertension Continue labetalol and aspirin. Reviewed BMP. Stable.    Family/ staff Communication: reviewed care plan with patient and charge nurse.    Labs/tests ordered:  none   Blanchie Serve, MD Internal Medicine [facility] [address] [place], [place] [address] Cell Phone (Monday-Friday 8 am - 5 pm): [phone] On Call: [phone] and follow prompts after 5 pm and on weekends Office Phone: [phone] Office Fax: [phone]

## 2017-03-13 NOTE — Progress Notes (Signed)
This encounter was created in error - please disregard.

## 2017-04-10 ENCOUNTER — Non-Acute Institutional Stay (SKILLED_NURSING_FACILITY): Payer: Medicare Other | Admitting: Nurse Practitioner

## 2017-04-10 DIAGNOSIS — I10 Essential (primary) hypertension: Secondary | ICD-10-CM

## 2017-04-10 DIAGNOSIS — K5900 Constipation, unspecified: Secondary | ICD-10-CM | POA: Diagnosis not present

## 2017-04-10 DIAGNOSIS — E039 Hypothyroidism, unspecified: Secondary | ICD-10-CM

## 2017-04-10 DIAGNOSIS — F0280 Dementia in other diseases classified elsewhere without behavioral disturbance: Secondary | ICD-10-CM

## 2017-04-10 DIAGNOSIS — K219 Gastro-esophageal reflux disease without esophagitis: Secondary | ICD-10-CM | POA: Diagnosis not present

## 2017-04-10 DIAGNOSIS — G301 Alzheimer's disease with late onset: Secondary | ICD-10-CM | POA: Diagnosis not present

## 2017-04-10 NOTE — Assessment & Plan Note (Addendum)
she has dementia, oriented to person and place, ambulates with walker, incontinent of urine sometimes, wears adult brief, she denied pain upon my visit today. Last MMSE 12/30 02/12/17, continue Memantine 28mg  po qd

## 2017-04-10 NOTE — Assessment & Plan Note (Addendum)
her blood pressure is controlled, continue Labetalol 100mg  bid po

## 2017-04-10 NOTE — Assessment & Plan Note (Signed)
GERD, stable since off Famotidine, continued Omeprazole 20mg  qd since 03/13/17

## 2017-04-10 NOTE — Assessment & Plan Note (Signed)
Hypothyroidism, continue Levothyroxine 184mcg daily, last TSH 0.70 02/26/17

## 2017-04-10 NOTE — Progress Notes (Signed)
Location:  [facility] Room Number: 53 Place of Service:  SNF (31) Provider:  Algenis Ballin, Manxie  NP  Blanchie Serve, MD  Patient Care Team: Blanchie Serve, MD as PCP - General (Internal Medicine) Zadie Rhine Clent Demark, MD as Consulting Physician (Ophthalmology) Burnell Blanks, MD as Consulting Physician (Cardiology) Inda Castle, MD (Inactive) as Consulting Physician (Gastroenterology) Jari Pigg, MD as Consulting Physician (Dermatology) Dorna Leitz, MD as Consulting Physician (Orthopedic Surgery) Srihitha Tagliaferri X, NP as Nurse Practitioner (Internal Medicine)  Extended Emergency Contact Information Primary Emergency Contact: [facility] Address: [address]          [place], [place] [address] Johnnette Litter of [place] Phone: [phone] Relation: Daughter Secondary Emergency Contact: [place], [place], [place] [address] Johnnette Litter of [place] Phone: [phone] Relation: Son  Code Status:  DNR Goals of care: Advanced Directive information Advanced Directives 03/13/2017  Does Patient Have a Medical Advance Directive? Yes  Type of Advance Directive Out of facility DNR (pink MOST or yellow form)  Does patient want to make changes to medical advance directive? No - Patient declined  Copy of [place] in Chart? -  Pre-existing out of facility DNR order (yellow form or pink MOST form) Pink MOST form placed in chart (order not valid for inpatient use);Yellow form placed in chart (order not valid for inpatient use)     Chief Complaint  Patient presents with  . Medical Management of Chronic Issues    HPI:  Pt is a 83 y.o. female seen today for medical management of chronic diseases.     Hx of GERD, stable since off Famotidine, continued Omeprazole 20mg  qd since 03/13/17, she has dementia, taking Memantine 28mg  qd,  oriented to person and place, ambulates with walker, incontinent of urine sometimes, wears adult brief, she  denied pain upon my visit today. Hypothyroidism, on Levothyroxine 166mcg daily, last TSH 0.70 02/26/17, constipation is managed with Senokot S II daily, MiraLax qod, her blood pressure is controlled on Labetalol 100mg  bid   Past Medical History:  Diagnosis Date  . Acute blood loss anemia 03/29/2014   03/30/14 Hgb 7.3 04/04/14 Hgb 8.5 continue Fe bid.  05/04/14 Hgb 10.8   . Benign neoplasm of colon   . Cancer ([place])    pre-melanoma  . Cystocele   . Dementia of the Alzheimer's type 05/22/2014   05/19/14 MMSE 18/30 [place].    . Diastolic dysfunction 4/40/3474  . Diverticulosis of colon (without mention of hemorrhage)   . Diverticulosis of large intestine 06/28/2004  . Esophageal reflux   . Essential hypertension 07/05/2013  . Gastroparesis   . Hemorrhoids   . HTN (hypertension)   . Hyperlipidemia 07/05/2013  . Hypertension   . Hypothyroidism   . Internal hemorrhoids without mention of complication 25/12/5636  . Macular degeneration   . Nonspecific abnormal electrocardiogram (ECG) (EKG)   . Osteoarthritis    right hip  . Other and unspecified hyperlipidemia   . Prediabetes 11/17/2013  . Primary osteoarthritis of right hip 03/27/2014  . Shingles   . Skin cancer (melanoma) ([place])   . Stricture and stenosis of esophagus   . Stricture and stenosis of esophagus 10/27/2007  . Vitamin D deficiency 11/17/2013   Past Surgical History:  Procedure Laterality Date  . APPENDECTOMY    . BCC resection     legs/face  . CATARACT EXTRACTION, BILATERAL    . CHOLECYSTECTOMY    . FLEXIBLE  SIGMOIDOSCOPY  05/12/2011   Procedure: FLEXIBLE SIGMOIDOSCOPY;  Surgeon: Inda Castle, MD;  Location: [facility];  Service: Endoscopy;  Laterality: N/A;  . FLEXIBLE SIGMOIDOSCOPY  09/10/2011   Procedure: FLEXIBLE SIGMOIDOSCOPY;  Surgeon: Inda Castle, MD;  [facility];  Service: Endoscopy;  Laterality: N/A;  . FLEXIBLE SIGMOIDOSCOPY N/A 08/16/2012   Procedure: FLEXIBLE SIGMOIDOSCOPY;  Surgeon: Inda Castle, MD;  [facility]: [facility];  Service: Endoscopy;  Laterality: N/A;  . FRACTURE SURGERY     left hip pin  . GALLBLADDER SURGERY  2002  . HEMORRHOID BANDING N/A 08/16/2012   Procedure: HEMORRHOID BANDING;  Surgeon: Inda Castle, MD;  [facility];  Service: Endoscopy;  Laterality: N/A;  . HEMORRHOID SURGERY    . left eye surgery    . melenoma resection     right arm  . THYROIDECTOMY    . TONSILLECTOMY    . TOTAL HIP ARTHROPLASTY Right 03/27/2014   Procedure: TOTAL HIP ARTHROPLASTY ANTERIOR APPROACH;  Surgeon: Alta Corning, MD;  Location: [facility];  Service: Orthopedics;  Laterality: Right;    Allergies  Allergen Reactions  . Ace Inhibitors Other (See Comments)    Elevated potassium  . Statins Other (See Comments)    Myalgias   . Vicodin [Hydrocodone-Acetaminophen] Other (See Comments)    "felt spaced out"  . Fosamax [Alendronate Sodium] Other (See Comments)  . Metoclopramide Hcl Other (See Comments)  . Sulfa Antibiotics Rash  . Sulfonamide Derivatives Rash  . Vesicare [Solifenacin] Other (See Comments)    Outpatient Encounter Medications as of 04/10/2017  Medication Sig  . acetaminophen (TYLENOL) 500 MG tablet Take 1,000 mg by mouth 3 (three) times daily as needed for mild pain or moderate pain.  Marland Kitchen aspirin EC 81 MG tablet Take 81 mg by mouth daily.  . calcium carbonate (CAL-GEST ANTACID) 500 MG chewable tablet Chew 1 tablet by mouth daily as needed for indigestion or heartburn.  . Cholecalciferol (VITAMIN D) 2000 UNITS tablet Take 4,000 Units by mouth daily.  . famotidine (PEPCID) 10 MG tablet Take 10 mg by mouth at bedtime.  Marland Kitchen guaiFENesin (ROBITUSSIN) 100 MG/5ML SOLN Take 10 mLs by mouth every 6 (six) hours as needed for cough or to loosen phlegm.  . labetalol (NORMODYNE) 100 MG tablet Take 100 mg by mouth 2 (two) times daily.  Marland Kitchen levothyroxine (SYNTHROID, LEVOTHROID) 100 MCG tablet Take 100 mcg by mouth daily before breakfast.  . memantine (NAMENDA XR) 28 MG  CP24 24 hr capsule Take 28 mg by mouth daily.  . Multiple Vitamins-Minerals (PRESERVISION AREDS 2) CAPS Take 1 capsule by mouth 2 (two) times daily.  Marland Kitchen omeprazole (PRILOSEC) 20 MG capsule Take 20 mg by mouth daily.  . polyethylene glycol (MIRALAX / GLYCOLAX) packet Take 17 g by mouth every other day.   . protein supplement (RESOURCE BENEPROTEIN) POWD Take 1 scoop by mouth 2 (two) times daily.   . sennosides-docusate sodium (SENOKOT-S) 8.6-50 MG tablet Take 2 tablets by mouth at bedtime.    No facility-administered encounter medications on file as of 04/10/2017.    ROS was provided with assistance of staff Review of Systems  Constitutional: Negative for activity change, appetite change, chills, diaphoresis, fatigue and fever.  HENT: Positive for hearing loss. Negative for congestion, trouble swallowing and voice change.   Eyes: Negative for visual disturbance.  Respiratory: Negative for cough, choking, shortness of breath and wheezing.   Cardiovascular: Negative for chest pain, palpitations and leg swelling.  Gastrointestinal: Negative for abdominal  distention, abdominal pain, constipation, diarrhea, nausea and vomiting.  Endocrine: Negative for cold intolerance.  Genitourinary: Negative for difficulty urinating, dysuria and urgency.       Adult brief, toileting self sometimes, incontinent of urine at times.   Musculoskeletal: Positive for gait problem.       Ambulates with walker, wheelchair to go further.   Skin: Negative for color change, pallor, rash and wound.  Neurological: Negative for tremors, speech difficulty, weakness and headaches.       Dementia  Psychiatric/Behavioral: Positive for confusion. Negative for agitation, behavioral problems, hallucinations and sleep disturbance. The patient is not nervous/anxious.     Immunization History  Administered Date(s) Administered  . Influenza, High Dose Seasonal PF 12/27/2013  . Influenza-Unspecified 01/12/2013, 01/02/2015,  01/29/2016, 02/03/2017  . Pneumococcal-Unspecified 07/01/2012  . Td 05/27/2004  . Tdap 02/21/2012  . Zoster 04/15/2007   Pertinent  Health Maintenance Due  Topic Date Due  . PNA vac Low Risk Adult (2 of 2 - PCV13) 04/14/2017 (Originally 07/01/2013)  . INFLUENZA VACCINE  Completed  . DEXA SCAN  Completed   Fall Risk  11/28/2016 05/22/2014 10/06/2013 06/02/2013  Falls in the past year? No No No No  Risk for fall due to : - - History of fall(s);Impaired balance/gait;Impaired mobility Impaired mobility   Functional Status Survey:    There were no vitals filed for this visit. There is no height or weight on file to calculate BMI. Physical Exam  Constitutional: She appears well-developed and well-nourished.  HENT:  Head: Normocephalic and atraumatic.  Eyes: Conjunctivae and EOM are normal. Pupils are equal, round, and reactive to light.  Neck: Normal range of motion. Neck supple. No JVD present. No thyromegaly present.  Cardiovascular: Normal rate, regular rhythm and normal heart sounds.  No murmur heard. Pulmonary/Chest: Effort normal. No respiratory distress. She has no wheezes. She has rales.  Posterior left lower lung rales  Abdominal: Soft. Bowel sounds are normal. She exhibits no distension. There is no tenderness.  Musculoskeletal: Normal range of motion. She exhibits no edema or tenderness.  Ambulates with walker, self transfer  Neurological: She is alert. She exhibits normal muscle tone. Coordination normal.  Oriented to person and place.   Skin: Skin is warm and dry. No rash noted. No pallor.  Psychiatric: She has a normal mood and affect. Her behavior is normal. Judgment and thought content normal.    Labs reviewed: Recent Labs    09/16/16 11/21/16 02/11/17  NA 142 142 142  K 3.6 4.1 3.4  BUN 23* 21 21  CREATININE 0.7 0.8 0.8   Recent Labs    02/11/17  AST 13  ALT 8  ALKPHOS 75   Recent Labs    07/15/16 09/16/16 02/11/17  WBC 7.4 8.3 8.9  HGB 12.1 12.5 12.0    HCT 36 37 36  PLT 265 248 255   Lab Results  Component Value Date   TSH 0.70 02/26/2017   Lab Results  Component Value Date   HGBA1C 5.8 (H) 07/06/2014   Lab Results  Component Value Date   CHOL 228 (A) 07/15/2016   HDL 55 07/15/2016   LDLCALC 146 07/15/2016   TRIG 141 07/15/2016   CHOLHDL 3.2 07/06/2014    Significant Diagnostic Results in last 30 days:  No results found.  Assessment/Plan GERD GERD, stable since off Famotidine, continued Omeprazole 20mg  qd since 03/13/17  Dementia of the Alzheimer's type she has dementia, oriented to person and place, ambulates with walker, incontinent of urine sometimes,  wears adult brief, she denied pain upon my visit today. Last MMSE 12/30 02/12/17, continue Memantine 28mg  po qd  Hypothyroidism Hypothyroidism, continue Levothyroxine 12mcg daily, last TSH 0.70 02/26/17  Constipation constipation is stable, continue Senokot S II daily, MiraLax qod  Essential hypertension her blood pressure is controlled, continue Labetalol 100mg  bid po      Family/ staff Communication: plan of care reviewed with the patient and charge nurse.   Labs/tests ordered:  none  Time spend 25 minutes.

## 2017-04-10 NOTE — Assessment & Plan Note (Addendum)
constipation is stable, continue Senokot S II daily, MiraLax qod

## 2017-04-17 ENCOUNTER — Non-Acute Institutional Stay (SKILLED_NURSING_FACILITY): Payer: Medicare Other | Admitting: Nurse Practitioner

## 2017-04-17 DIAGNOSIS — F0280 Dementia in other diseases classified elsewhere without behavioral disturbance: Secondary | ICD-10-CM | POA: Diagnosis not present

## 2017-04-17 DIAGNOSIS — M79671 Pain in right foot: Secondary | ICD-10-CM | POA: Diagnosis not present

## 2017-04-17 DIAGNOSIS — W19XXXA Unspecified fall, initial encounter: Secondary | ICD-10-CM | POA: Diagnosis not present

## 2017-04-17 DIAGNOSIS — G301 Alzheimer's disease with late onset: Secondary | ICD-10-CM

## 2017-04-17 DIAGNOSIS — R2681 Unsteadiness on feet: Secondary | ICD-10-CM | POA: Diagnosis not present

## 2017-04-17 NOTE — Assessment & Plan Note (Signed)
She resides in [facility], takes Memantine 28mg  po daily for memory. She has history of gait issue, ambulates with walker on unit.

## 2017-04-17 NOTE — Progress Notes (Deleted)
Location:  [place] Room Number: 14 Place of Service:  SNF (31) Provider: Mast, Manxie  NP  Blanchie Serve, MD  Patient Care Team: Blanchie Serve, MD as PCP - General (Internal Medicine) Zadie Rhine Clent Demark, MD as Consulting Physician (Ophthalmology) Burnell Blanks, MD as Consulting Physician (Cardiology) Inda Castle, MD (Inactive) as Consulting Physician (Gastroenterology) Jari Pigg, MD as Consulting Physician (Dermatology) Dorna Leitz, MD as Consulting Physician (Orthopedic Surgery) Mast, Man X, NP as Nurse Practitioner (Internal Medicine)  Extended Emergency Contact Information Primary Emergency Contact: [facility] Address: [address]          [place], [place] [address] Johnnette Litter of [place] Phone: [phone] Relation: Daughter Secondary Emergency Contact: Precilla, Purnell [place], [place] [address] Johnnette Litter of [place] Phone: [phone] Relation: Son  Code Status:  DNR Goals of care: Advanced Directive information Advanced Directives 04/17/2017  Does Patient Have a Medical Advance Directive? Yes  Type of Advance Directive Out of facility DNR (pink MOST or yellow form)  Does patient want to make changes to medical advance directive? No - Patient declined  Copy of [place] in Chart? -  Pre-existing out of facility DNR order (yellow form or pink MOST form) Yellow form placed in chart (order not valid for inpatient use)     Chief Complaint  Patient presents with  . Medical Management of Chronic Issues    HPI:  Pt is a 84 y.o. female seen today for medical management of chronic diseases.     Past Medical History:  Diagnosis Date  . Acute blood loss anemia 03/29/2014   03/30/14 Hgb 7.3 04/04/14 Hgb 8.5 continue Fe bid.  05/04/14 Hgb 10.8   . Benign neoplasm of colon   . Cancer ([place])    pre-melanoma  . Cystocele   . Dementia of the Alzheimer's type 05/22/2014   05/19/14 MMSE 18/30 Namenda.     . Diastolic dysfunction 2/59/5638  . Diverticulosis of colon (without mention of hemorrhage)   . Diverticulosis of large intestine 06/28/2004  . Esophageal reflux   . Essential hypertension 07/05/2013  . Gastroparesis   . Hemorrhoids   . HTN (hypertension)   . Hyperlipidemia 07/05/2013  . Hypertension   . Hypothyroidism   . Internal hemorrhoids without mention of complication 75/09/4330  . Macular degeneration   . Nonspecific abnormal electrocardiogram (ECG) (EKG)   . Osteoarthritis    right hip  . Other and unspecified hyperlipidemia   . Prediabetes 11/17/2013  . Primary osteoarthritis of right hip 03/27/2014  . Shingles   . Skin cancer (melanoma) ([place])   . Stricture and stenosis of esophagus   . Stricture and stenosis of esophagus 10/27/2007  . Vitamin D deficiency 11/17/2013   Past Surgical History:  Procedure Laterality Date  . APPENDECTOMY    . BCC resection     legs/face  . CATARACT EXTRACTION, BILATERAL    . CHOLECYSTECTOMY    . FLEXIBLE SIGMOIDOSCOPY  05/12/2011   Procedure: FLEXIBLE SIGMOIDOSCOPY;  Surgeon: Inda Castle, MD;  [facility]: [facility];  Service: Endoscopy;  Laterality: N/A;  . FLEXIBLE SIGMOIDOSCOPY  09/10/2011   Procedure: FLEXIBLE SIGMOIDOSCOPY;  Surgeon: Inda Castle, MD;  [facility];  Service: Endoscopy;  Laterality: N/A;  . FLEXIBLE SIGMOIDOSCOPY N/A 08/16/2012   Procedure: FLEXIBLE SIGMOIDOSCOPY;  Surgeon: Inda Castle, MD;  [facility];  Service: Endoscopy;  Laterality: N/A;  . FRACTURE SURGERY  left hip pin  . GALLBLADDER SURGERY  2002  . HEMORRHOID BANDING N/A 08/16/2012   Procedure: HEMORRHOID BANDING;  Surgeon: Inda Castle, MD;  [facility];  Service: Endoscopy;  Laterality: N/A;  . HEMORRHOID SURGERY    . left eye surgery    . melenoma resection     right arm  . THYROIDECTOMY    . TONSILLECTOMY    . TOTAL HIP ARTHROPLASTY Right 03/27/2014   Procedure: TOTAL HIP ARTHROPLASTY ANTERIOR APPROACH;   Surgeon: Alta Corning, MD;  Location: [place];  Service: Orthopedics;  Laterality: Right;    Allergies  Allergen Reactions  . Ace Inhibitors Other (See Comments)    Elevated potassium  . Statins Other (See Comments)    Myalgias   . Vicodin [Hydrocodone-Acetaminophen] Other (See Comments)    "felt spaced out"  . Fosamax [Alendronate Sodium] Other (See Comments)  . Metoclopramide Hcl Other (See Comments)  . Sulfa Antibiotics Rash  . Sulfonamide Derivatives Rash  . Vesicare [Solifenacin] Other (See Comments)    Outpatient Encounter Medications as of 04/17/2017  Medication Sig  . acetaminophen (TYLENOL) 500 MG tablet Take 1,000 mg by mouth 3 (three) times daily as needed for mild pain or moderate pain.  Marland Kitchen aspirin EC 81 MG tablet Take 81 mg by mouth daily.  . calcium carbonate (CAL-GEST ANTACID) 500 MG chewable tablet Chew 1 tablet by mouth daily as needed for indigestion or heartburn.  . Cholecalciferol (VITAMIN D) 2000 UNITS tablet Take 4,000 Units by mouth daily.  Marland Kitchen guaiFENesin (ROBITUSSIN) 100 MG/5ML SOLN Take 10 mLs by mouth every 6 (six) hours as needed for cough or to loosen phlegm.  . labetalol (NORMODYNE) 100 MG tablet Take 100 mg by mouth 2 (two) times daily.  Marland Kitchen levothyroxine (SYNTHROID, LEVOTHROID) 100 MCG tablet Take 100 mcg by mouth daily before breakfast.  . memantine (NAMENDA XR) 28 MG CP24 24 hr capsule Take 28 mg by mouth daily.  . Multiple Vitamins-Minerals (PRESERVISION AREDS 2) CAPS Take 1 capsule by mouth 2 (two) times daily.  Marland Kitchen omeprazole (PRILOSEC) 20 MG capsule Take 20 mg by mouth daily.  . polyethylene glycol (MIRALAX / GLYCOLAX) packet Take 17 g by mouth every other day.   . protein supplement (RESOURCE BENEPROTEIN) POWD Take 1 scoop by mouth 2 (two) times daily.   . sennosides-docusate sodium (SENOKOT-S) 8.6-50 MG tablet Take 2 tablets by mouth at bedtime.   . [DISCONTINUED] famotidine (PEPCID) 10 MG tablet Take 10 mg by mouth at bedtime.   No  facility-administered encounter medications on file as of 04/17/2017.     Review of Systems  Immunization History  Administered Date(s) Administered  . Influenza, High Dose Seasonal PF 12/27/2013  . Influenza-Unspecified 01/12/2013, 01/02/2015, 01/29/2016, 02/03/2017  . Pneumococcal-Unspecified 07/01/2012  . Td 05/27/2004  . Tdap 02/21/2012  . Zoster 04/15/2007   Pertinent  Health Maintenance Due  Topic Date Due  . PNA vac Low Risk Adult (2 of 2 - PCV13) 07/01/2013  . INFLUENZA VACCINE  Completed  . DEXA SCAN  Completed   Fall Risk  11/28/2016 05/22/2014 10/06/2013 06/02/2013  Falls in the past year? No No No No  Risk for fall due to : - - History of fall(s);Impaired balance/gait;Impaired mobility Impaired mobility   Functional Status Survey:    There were no vitals filed for this visit. There is no height or weight on file to calculate BMI. Physical Exam  Labs reviewed: Recent Labs    09/16/16 11/21/16 02/11/17  NA 142  142 142  K 3.6 4.1 3.4  BUN 23* 21 21  CREATININE 0.7 0.8 0.8   Recent Labs    02/11/17  AST 13  ALT 8  ALKPHOS 75   Recent Labs    07/15/16 09/16/16 02/11/17  WBC 7.4 8.3 8.9  HGB 12.1 12.5 12.0  HCT 36 37 36  PLT 265 248 255   Lab Results  Component Value Date   TSH 0.70 02/26/2017   Lab Results  Component Value Date   HGBA1C 5.8 (H) 07/06/2014   Lab Results  Component Value Date   CHOL 228 (A) 07/15/2016   HDL 55 07/15/2016   LDLCALC 146 07/15/2016   TRIG 141 07/15/2016   CHOLHDL 3.2 07/06/2014    Significant Diagnostic Results in last 30 days:  No results found.  Assessment/Plan There are no diagnoses linked to this encounter.   Family/ staff Communication:   Labs/tests ordered:

## 2017-04-17 NOTE — Assessment & Plan Note (Signed)
s/p fall, the patient was found sitting on buttocks in room, the patient is unable to give detailed HPI due to her dementia. Lack of safety and unsteady gait contributory, close supervision needed for safety.

## 2017-04-17 NOTE — Assessment & Plan Note (Signed)
Staff reported the patient has pain in her right foot upon her examination after the patient's fall. Pain was reproduced when her right foot/toes examined, will apply cold compress up to 4x/day x 24 hours, observe

## 2017-04-17 NOTE — Assessment & Plan Note (Signed)
She has history of gait issue, ambulates with walker on unit.

## 2017-04-17 NOTE — Progress Notes (Signed)
Location:  [place] Room Number: 53 Place of Service:  SNF (31) Provider:  Drexel Ivey, Manxie  NP  Blanchie Serve, MD  Patient Care Team: Blanchie Serve, MD as PCP - General (Internal Medicine) Zadie Rhine Clent Demark, MD as Consulting Physician (Ophthalmology) Burnell Blanks, MD as Consulting Physician (Cardiology) Inda Castle, MD (Inactive) as Consulting Physician (Gastroenterology) Jari Pigg, MD as Consulting Physician (Dermatology) Dorna Leitz, MD as Consulting Physician (Orthopedic Surgery) Airiel Oblinger X, NP as Nurse Practitioner (Internal Medicine)  Extended Emergency Contact Information Primary Emergency Contact: [facility] Address: [address]          [place], [place] [address] Johnnette Litter of [place] Phone: [phone] Relation: Daughter Secondary Emergency Contact: Emberlynn, Riggan [place], [place] [address] Johnnette Litter of [place] Phone: [phone] Relation: Son  Code Status:  DNR Goals of care: Advanced Directive information Advanced Directives 04/17/2017  Does Patient Have a Medical Advance Directive? Yes  Type of Advance Directive Out of facility DNR (pink MOST or yellow form)  Does patient want to make changes to medical advance directive? No - Patient declined  Copy of [place] in Chart? -  Pre-existing out of facility DNR order (yellow form or pink MOST form) Yellow form placed in chart (order not valid for inpatient use)     Chief Complaint  Patient presents with  . Acute Visit    Resident fell c/o pain in second toe.    HPI:  Pt is a 84 y.o. female seen today for an acute visit for s/p fall, the patient was found sitting on buttocks in room, the patient is unable to give detailed HPI due to her dementia. Staff reported the patient has pain in her right foot upon her examination after the patient's fall. She resides in SNF [facility], takes Memantine 28mg  po daily for memory. She has history of gait  issue, ambulates with walker on unit.    Past Medical History:  Diagnosis Date  . Acute blood loss anemia 03/29/2014   03/30/14 Hgb 7.3 04/04/14 Hgb 8.5 continue Fe bid.  05/04/14 Hgb 10.8   . Benign neoplasm of colon   . Cancer ([place])    pre-melanoma  . Cystocele   . Dementia of the Alzheimer's type 05/22/2014   05/19/14 MMSE 18/30 Namenda.    . Diastolic dysfunction 0/93/2355  . Diverticulosis of colon (without mention of hemorrhage)   . Diverticulosis of large intestine 06/28/2004  . Esophageal reflux   . Essential hypertension 07/05/2013  . Gastroparesis   . Hemorrhoids   . HTN (hypertension)   . Hyperlipidemia 07/05/2013  . Hypertension   . Hypothyroidism   . Internal hemorrhoids without mention of complication 73/05/2023  . Macular degeneration   . Nonspecific abnormal electrocardiogram (ECG) (EKG)   . Osteoarthritis    right hip  . Other and unspecified hyperlipidemia   . Prediabetes 11/17/2013  . Primary osteoarthritis of right hip 03/27/2014  . Shingles   . Skin cancer (melanoma) ([place])   . Stricture and stenosis of esophagus   . Stricture and stenosis of esophagus 10/27/2007  . Vitamin D deficiency 11/17/2013   Past Surgical History:  Procedure Laterality Date  . APPENDECTOMY    . BCC resection     legs/face  . CATARACT EXTRACTION, BILATERAL    . CHOLECYSTECTOMY    . FLEXIBLE SIGMOIDOSCOPY  05/12/2011   Procedure: FLEXIBLE SIGMOIDOSCOPY;  Surgeon: Inda Castle, MD;  [facility];  Service: Endoscopy;  Laterality: N/A;  . FLEXIBLE SIGMOIDOSCOPY  09/10/2011   Procedure: FLEXIBLE SIGMOIDOSCOPY;  Surgeon: Inda Castle, MD;  [facility];  Service: Endoscopy;  Laterality: N/A;  . FLEXIBLE SIGMOIDOSCOPY N/A 08/16/2012   Procedure: FLEXIBLE SIGMOIDOSCOPY;  Surgeon: Inda Castle, MD;  [facility];  Service: Endoscopy;  Laterality: N/A;  . FRACTURE SURGERY     left hip pin  . GALLBLADDER SURGERY  2002  . HEMORRHOID BANDING N/A 08/16/2012    Procedure: HEMORRHOID BANDING;  Surgeon: Inda Castle, MD;  [facility];  Service: Endoscopy;  Laterality: N/A;  . HEMORRHOID SURGERY    . left eye surgery    . melenoma resection     right arm  . THYROIDECTOMY    . TONSILLECTOMY    . TOTAL HIP ARTHROPLASTY Right 03/27/2014   Procedure: TOTAL HIP ARTHROPLASTY ANTERIOR APPROACH;  Surgeon: Alta Corning, MD;  Location: [place];  Service: Orthopedics;  Laterality: Right;    Allergies  Allergen Reactions  . Ace Inhibitors Other (See Comments)    Elevated potassium  . Statins Other (See Comments)    Myalgias   . Vicodin [Hydrocodone-Acetaminophen] Other (See Comments)    "felt spaced out"  . Fosamax [Alendronate Sodium] Other (See Comments)  . Metoclopramide Hcl Other (See Comments)  . Sulfa Antibiotics Rash  . Sulfonamide Derivatives Rash  . Vesicare [Solifenacin] Other (See Comments)    Outpatient Encounter Medications as of 04/17/2017  Medication Sig  . acetaminophen (TYLENOL) 500 MG tablet Take 1,000 mg by mouth 3 (three) times daily as needed for mild pain or moderate pain.  Marland Kitchen aspirin EC 81 MG tablet Take 81 mg by mouth daily.  . calcium carbonate (CAL-GEST ANTACID) 500 MG chewable tablet Chew 1 tablet by mouth daily as needed for indigestion or heartburn.  . Cholecalciferol (VITAMIN D) 2000 UNITS tablet Take 4,000 Units by mouth daily.  Marland Kitchen guaiFENesin (ROBITUSSIN) 100 MG/5ML SOLN Take 10 mLs by mouth every 6 (six) hours as needed for cough or to loosen phlegm.  . labetalol (NORMODYNE) 100 MG tablet Take 100 mg by mouth 2 (two) times daily.  Marland Kitchen levothyroxine (SYNTHROID, LEVOTHROID) 100 MCG tablet Take 100 mcg by mouth daily before breakfast.  . memantine (NAMENDA XR) 28 MG CP24 24 hr capsule Take 28 mg by mouth daily.  . Multiple Vitamins-Minerals (PRESERVISION AREDS 2) CAPS Take 1 capsule by mouth 2 (two) times daily.  Marland Kitchen omeprazole (PRILOSEC) 20 MG capsule Take 20 mg by mouth daily.  . polyethylene glycol (MIRALAX /  GLYCOLAX) packet Take 17 g by mouth every other day.   . protein supplement (RESOURCE BENEPROTEIN) POWD Take 1 scoop by mouth 2 (two) times daily.   . sennosides-docusate sodium (SENOKOT-S) 8.6-50 MG tablet Take 2 tablets by mouth at bedtime.   . [DISCONTINUED] famotidine (PEPCID) 10 MG tablet Take 10 mg by mouth at bedtime.   No facility-administered encounter medications on file as of 04/17/2017.     Review of Systems  Constitutional: Negative for activity change, appetite change, chills, diaphoresis, fatigue and fever.  Cardiovascular: Negative for chest pain, palpitations and leg swelling.  Musculoskeletal: Positive for gait problem.       R foot pain  Skin: Negative for color change and wound.  Neurological: Negative for dizziness, tremors, speech difficulty, weakness, numbness and headaches.  Psychiatric/Behavioral: Positive for confusion. Negative for agitation, behavioral problems and hallucinations. The patient is not nervous/anxious.     Immunization History  Administered Date(s) Administered  . Influenza, High Dose Seasonal PF 12/27/2013  . Influenza-Unspecified 01/12/2013, 01/02/2015, 01/29/2016, 02/03/2017  . Pneumococcal-Unspecified 07/01/2012  . Td 05/27/2004  . Tdap 02/21/2012  . Zoster 04/15/2007   Pertinent  Health Maintenance Due  Topic Date Due  . PNA vac Low Risk Adult (2 of 2 - PCV13) 07/01/2013  . INFLUENZA VACCINE  Completed  . DEXA SCAN  Completed   Fall Risk  11/28/2016 05/22/2014 10/06/2013 06/02/2013  Falls in the past year? No No No No  Risk for fall due to : - - History of fall(s);Impaired balance/gait;Impaired mobility Impaired mobility   Functional Status Survey:    There were no vitals filed for this visit. There is no height or weight on file to calculate BMI. Physical Exam  Constitutional: She appears well-developed and well-nourished. No distress.  HENT:  Head: Normocephalic and atraumatic.  Eyes: Conjunctivae and EOM are normal. Pupils are  equal, round, and reactive to light.  Cardiovascular: Normal rate and normal heart sounds.  Pulmonary/Chest: Effort normal and breath sounds normal. She has no wheezes. She has no rales.  Musculoskeletal: Normal range of motion. She exhibits tenderness. She exhibits no edema.  Right 2nd toe pain with ROM, top of the right foot pain when palpated.   Neurological: She is alert. No cranial nerve deficit. She exhibits normal muscle tone. Coordination normal.  Oriented to person  Skin: Skin is warm and dry. No rash noted. She is not diaphoretic. No erythema.  Psychiatric: She has a normal mood and affect. Her behavior is normal.    Labs reviewed: Recent Labs    09/16/16 11/21/16 02/11/17  NA 142 142 142  K 3.6 4.1 3.4  BUN 23* 21 21  CREATININE 0.7 0.8 0.8   Recent Labs    02/11/17  AST 13  ALT 8  ALKPHOS 75   Recent Labs    07/15/16 09/16/16 02/11/17  WBC 7.4 8.3 8.9  HGB 12.1 12.5 12.0  HCT 36 37 36  PLT 265 248 255   Lab Results  Component Value Date   TSH 0.70 02/26/2017   Lab Results  Component Value Date   HGBA1C 5.8 (H) 07/06/2014   Lab Results  Component Value Date   CHOL 228 (A) 07/15/2016   HDL 55 07/15/2016   LDLCALC 146 07/15/2016   TRIG 141 07/15/2016   CHOLHDL 3.2 07/06/2014    Significant Diagnostic Results in last 30 days:  No results found.  Assessment/Plan Fall s/p fall, the patient was found sitting on buttocks in room, the patient is unable to give detailed HPI due to her dementia. Lack of safety and unsteady gait contributory, close supervision needed for safety.   Right foot pain Staff reported the patient has pain in her right foot upon her examination after the patient's fall. Pain was reproduced when her right foot/toes examined, will apply cold compress up to 4x/day x 24 hours, observe  Dementia of the Alzheimer's type She resides in SNF [facility], takes Memantine 28mg  po daily for memory. She has history of gait issue, ambulates with walker  on unit.    Unstable gait She has history of gait issue, ambulates with walker on unit.       Family/ staff Communication: plan of care reviewed with the patient and charge nurse.   Labs/tests ordered:  None  Time spend 15 minutes

## 2017-04-20 ENCOUNTER — Non-Acute Institutional Stay (SKILLED_NURSING_FACILITY): Payer: Medicare Other | Admitting: Nurse Practitioner

## 2017-04-20 DIAGNOSIS — I10 Essential (primary) hypertension: Secondary | ICD-10-CM

## 2017-04-20 DIAGNOSIS — J90 Pleural effusion, not elsewhere classified: Secondary | ICD-10-CM

## 2017-04-20 DIAGNOSIS — I259 Chronic ischemic heart disease, unspecified: Secondary | ICD-10-CM | POA: Diagnosis not present

## 2017-04-20 DIAGNOSIS — E782 Mixed hyperlipidemia: Secondary | ICD-10-CM | POA: Diagnosis not present

## 2017-04-20 NOTE — Assessment & Plan Note (Signed)
Off Zetia since 2016, last LDL 146 07/15/16, may repeat lipid panel, cholesterol lowering agent maybe indicated.

## 2017-04-20 NOTE — Assessment & Plan Note (Signed)
Hx of pleural effusion, s/p removal of 858ml of pleural fluid, inflammation pleural fluid culture and cytology.

## 2017-04-20 NOTE — Assessment & Plan Note (Signed)
Blood pressure is controlled, continue Labetalol 100mg  bid po.

## 2017-04-20 NOTE — Progress Notes (Signed)
Location:  [facility] Room Number: 42 Place of Service:  SNF (31) Provider:  Argelio Granier, [place]  NP  Blanchie Serve, MD  Patient Care Team: Blanchie Serve, MD as PCP - General (Internal Medicine) Zadie Rhine Clent Demark, MD as Consulting Physician (Ophthalmology) Burnell Blanks, MD as Consulting Physician (Cardiology) Inda Castle, MD (Inactive) as Consulting Physician (Gastroenterology) Jari Pigg, MD as Consulting Physician (Dermatology) Dorna Leitz, MD as Consulting Physician (Orthopedic Surgery) Mariabelen Pressly X, NP as Nurse Practitioner (Internal Medicine)  Extended Emergency Contact Information Primary Emergency Contact: [facility] Address: [address]          [place], [place] [address] Johnnette Litter of [place] Phone: [phone] Relation: Daughter Secondary Emergency Contact: Evalette, [place], [place] [address] Johnnette Litter of [place] Phone: [phone] Relation: Son  Code Status:  DNR Goals of care: Advanced Directive information Advanced Directives 04/17/2017  Does Patient Have a Medical Advance Directive? Yes  Type of Advance Directive Out of facility DNR (pink MOST or yellow form)  Does patient want to make changes to medical advance directive? No - Patient declined  Copy of [place] in Chart? -  Pre-existing out of facility DNR order (yellow form or pink MOST form) Yellow form placed in chart (order not valid for inpatient use)     Chief Complaint  Patient presents with  . Acute Visit    Chest pain     HPI:  Pt is a 84 y.o. female seen today for an acute visit for complaining of chest pain, reported the patient standing in hallway outside of her room c/o chest pain at noon, she was afebrile, no O2 desaturation, Bp 180/161mmHg, she stated her pain is sharp in her chest all morning. Her blood pressure was normalized, her chest pain was resolved after Nitroglycerin 0.4mg  SL x2. She takes ASA 81mg  daily.  Off Zetia since 2016  Hx of HTN, blood pressure is controlled on Labetalol 100mg  bid.   Past Medical History:  Diagnosis Date  . Acute blood loss anemia 03/29/2014   03/30/14 Hgb 7.3 04/04/14 Hgb 8.5 continue Fe bid.  05/04/14 Hgb 10.8   . Benign neoplasm of colon   . Cancer ([place])    pre-melanoma  . Cystocele   . Dementia of the Alzheimer's type 05/22/2014   05/19/14 MMSE 18/30 Namenda.    . Diastolic dysfunction 1/32/4401  . Diverticulosis of colon (without mention of hemorrhage)   . Diverticulosis of large intestine 06/28/2004  . Esophageal reflux   . Essential hypertension 07/05/2013  . Gastroparesis   . Hemorrhoids   . HTN (hypertension)   . Hyperlipidemia 07/05/2013  . Hypertension   . Hypothyroidism   . Internal hemorrhoids without mention of complication 05/21/2534  . Macular degeneration   . Nonspecific abnormal electrocardiogram (ECG) (EKG)   . Osteoarthritis    right hip  . Other and unspecified hyperlipidemia   . Prediabetes 11/17/2013  . Primary osteoarthritis of right hip 03/27/2014  . Shingles   . Skin cancer (melanoma) ([place])   . Stricture and stenosis of esophagus   . Stricture and stenosis of esophagus 10/27/2007  . Vitamin D deficiency 11/17/2013   Past Surgical History:  Procedure Laterality Date  . APPENDECTOMY    . BCC resection     legs/face  . CATARACT EXTRACTION, BILATERAL    . CHOLECYSTECTOMY    . FLEXIBLE SIGMOIDOSCOPY  05/12/2011   Procedure: FLEXIBLE SIGMOIDOSCOPY;  Surgeon:  Inda Castle, MD;  Location: [facility];  Service: Endoscopy;  Laterality: N/A;  . FLEXIBLE SIGMOIDOSCOPY  09/10/2011   Procedure: FLEXIBLE SIGMOIDOSCOPY;  Surgeon: Inda Castle, MD;  Location: [facility];  Service: Endoscopy;  Laterality: N/A;  . FLEXIBLE SIGMOIDOSCOPY N/A 08/16/2012   Procedure: FLEXIBLE SIGMOIDOSCOPY;  Surgeon: Inda Castle, MD;  Location: [facility];  Service: Endoscopy;  Laterality: N/A;  . FRACTURE SURGERY     left hip pin  . GALLBLADDER SURGERY   2002  . HEMORRHOID BANDING N/A 08/16/2012   Procedure: HEMORRHOID BANDING;  Surgeon: Inda Castle, MD;  [facility]: [facility];  Service: Endoscopy;  Laterality: N/A;  . HEMORRHOID SURGERY    . left eye surgery    . melenoma resection     right arm  . THYROIDECTOMY    . TONSILLECTOMY    . TOTAL HIP ARTHROPLASTY Right 03/27/2014   Procedure: TOTAL HIP ARTHROPLASTY ANTERIOR APPROACH;  Surgeon: Alta Corning, MD;  Location: [facility];  Service: Orthopedics;  Laterality: Right;    Allergies  Allergen Reactions  . Ace Inhibitors Other (See Comments)    Elevated potassium  . Statins Other (See Comments)    Myalgias   . Vicodin [Hydrocodone-Acetaminophen] Other (See Comments)    "felt spaced out"  . Fosamax [Alendronate Sodium] Other (See Comments)  . Metoclopramide Hcl Other (See Comments)  . Sulfa Antibiotics Rash  . Sulfonamide Derivatives Rash  . Vesicare [Solifenacin] Other (See Comments)    Outpatient Encounter Medications as of 04/20/2017  Medication Sig  . acetaminophen (TYLENOL) 500 MG tablet Take 1,000 mg by mouth 3 (three) times daily as needed for mild pain or moderate pain.  Marland Kitchen aspirin EC 81 MG tablet Take 81 mg by mouth daily.  . calcium carbonate (CAL-GEST ANTACID) 500 MG chewable tablet Chew 1 tablet by mouth daily as needed for indigestion or heartburn.  . Cholecalciferol (VITAMIN D) 2000 UNITS tablet Take 4,000 Units by mouth daily.  Marland Kitchen guaiFENesin (ROBITUSSIN) 100 MG/5ML SOLN Take 10 mLs by mouth every 6 (six) hours as needed for cough or to loosen phlegm.  . labetalol (NORMODYNE) 100 MG tablet Take 100 mg by mouth 2 (two) times daily.  Marland Kitchen levothyroxine (SYNTHROID, LEVOTHROID) 100 MCG tablet Take 100 mcg by mouth daily before breakfast.  . memantine (NAMENDA XR) 28 MG CP24 24 hr capsule Take 28 mg by mouth daily.  . Multiple Vitamins-Minerals (PRESERVISION AREDS 2) CAPS Take 1 capsule by mouth 2 (two) times daily.  Marland Kitchen omeprazole (PRILOSEC) 20 MG capsule Take 20 mg by mouth  daily.  . polyethylene glycol (MIRALAX / GLYCOLAX) packet Take 17 g by mouth every other day.   . protein supplement (RESOURCE BENEPROTEIN) POWD Take 1 scoop by mouth 2 (two) times daily.   . sennosides-docusate sodium (SENOKOT-S) 8.6-50 MG tablet Take 2 tablets by mouth at bedtime.    No facility-administered encounter medications on file as of 04/20/2017.    ROS was provided with assistance of staff Review of Systems  Constitutional: Negative for activity change, appetite change, chills, diaphoresis, fatigue and fever.  HENT: Positive for hearing loss. Negative for congestion, trouble swallowing and voice change.   Respiratory: Negative for cough, choking, chest tightness, shortness of breath and wheezing.   Cardiovascular: Positive for chest pain. Negative for palpitations and leg swelling.  Gastrointestinal: Negative for abdominal distention, abdominal pain, constipation, diarrhea, nausea and vomiting.  Musculoskeletal: Positive for gait problem.  Skin: Negative for color change and pallor.  Neurological: Negative for  tremors, speech difficulty, weakness and headaches.       Dementia  Psychiatric/Behavioral: Positive for confusion. Negative for agitation, behavioral problems and sleep disturbance. The patient is not nervous/anxious.     Immunization History  Administered Date(s) Administered  . Influenza, High Dose Seasonal PF 12/27/2013  . Influenza-Unspecified 01/12/2013, 01/02/2015, 01/29/2016, 02/03/2017  . Pneumococcal-Unspecified 07/01/2012  . Td 05/27/2004  . Tdap 02/21/2012  . Zoster 04/15/2007   Pertinent  Health Maintenance Due  Topic Date Due  . PNA vac Low Risk Adult (2 of 2 - PCV13) 07/01/2013  . INFLUENZA VACCINE  Completed  . DEXA SCAN  Completed   Fall Risk  11/28/2016 05/22/2014 10/06/2013 06/02/2013  Falls in the past year? No No No No  Risk for fall due to : - - History of fall(s);Impaired balance/gait;Impaired mobility Impaired mobility   Functional Status  Survey:    Vitals:   04/20/17 1622  BP: (!) 180/100  Pulse: 80  Resp: 18  Temp: 99 F (37.2 C)  SpO2: 96%  Weight: 138 lb 6.4 oz (62.8 kg)  Height: 5\' 1"  (1.549 m)   Body mass index is 26.15 kg/m. Physical Exam  Constitutional: She appears well-developed and well-nourished. No distress.  HENT:  Head: Normocephalic and atraumatic.  Eyes: Conjunctivae and EOM are normal. Pupils are equal, round, and reactive to light.  Neck: Normal range of motion. Neck supple. No JVD present. No thyromegaly present.  Cardiovascular: Normal rate, regular rhythm and normal heart sounds.  No murmur heard. Pulmonary/Chest: Effort normal and breath sounds normal. She has no wheezes. She has no rales.  Abdominal: Soft. Bowel sounds are normal. She exhibits no distension. There is no tenderness.  Musculoskeletal: Normal range of motion. She exhibits no edema or tenderness.  Neurological: She is alert. She exhibits normal muscle tone. Coordination normal.  Oriented to self and her room   Skin: Skin is warm and dry. She is not diaphoretic.  Psychiatric: She has a normal mood and affect. Her behavior is normal.    Labs reviewed: Recent Labs    09/16/16 11/21/16 02/11/17  NA 142 142 142  K 3.6 4.1 3.4  BUN 23* 21 21  CREATININE 0.7 0.8 0.8   Recent Labs    02/11/17  AST 13  ALT 8  ALKPHOS 75   Recent Labs    07/15/16 09/16/16 02/11/17  WBC 7.4 8.3 8.9  HGB 12.1 12.5 12.0  HCT 36 37 36  PLT 265 248 255   Lab Results  Component Value Date   TSH 0.70 02/26/2017   Lab Results  Component Value Date   HGBA1C 5.8 (H) 07/06/2014   Lab Results  Component Value Date   CHOL 228 (A) 07/15/2016   HDL 55 07/15/2016   LDLCALC 146 07/15/2016   TRIG 141 07/15/2016   CHOLHDL 3.2 07/06/2014    Significant Diagnostic Results in last 30 days:  No results found.  Assessment/Plan Chest pain 04/20/17 complaining of chest pain, reported the patient standing in hallway outside of her room c/o  chest pain at noon, she was afebrile, no O2 desaturation, Bp 180/173mmHg, she stated her pain is sharp in her chest all morning. Her blood pressure was normalized, her chest pain was resolved after Nitroglycerin 0.4mg  SL x2. The patient had an similar episode in the past, 03/18/16 CXR stable chronic scarring mid lungs, mild cardiomegaly w/o overt CHF, no acute parenchymal pathology 03/20/16 EKG normal sinus rhythm 03/18/16 wbc 8.1, Hgb 11.4, Plt 225, na 145, K 4.0,  Bun 25, creat 0.81 Will continue to observe the patient, CXR EKG may be indicated if chest pain recurs.   Essential hypertension Blood pressure is controlled, continue Labetalol 100mg  bid po.   Pleural effusion Hx of pleural effusion, s/p removal of 865ml of pleural fluid, inflammation pleural fluid culture and cytology.   Hyperlipidemia Off Zetia since 2016, last LDL 146 07/15/16, may repeat lipid panel, cholesterol lowering agent maybe indicated.      Family/ staff Communication: plan of care reviewed with the patient and charge nurse  Labs/tests ordered:  none  Time spend 25 minutes.

## 2017-04-20 NOTE — Assessment & Plan Note (Signed)
04/20/17 complaining of chest pain, reported the patient standing in hallway outside of her room c/o chest pain at noon, she was afebrile, no O2 desaturation, Bp 180/135mmHg, she stated her pain is sharp in her chest all morning. Her blood pressure was normalized, her chest pain was resolved after Nitroglycerin 0.4mg  SL x2. The patient had an similar episode in the past, 03/18/16 CXR stable chronic scarring mid lungs, mild cardiomegaly w/o overt CHF, no acute parenchymal pathology 03/20/16 EKG normal sinus rhythm 03/18/16 wbc 8.1, Hgb 11.4, Plt 225, na 145, K 4.0, Bun 25, creat 0.81 Will continue to observe the patient, CXR EKG may be indicated if chest pain recurs.

## 2017-05-12 ENCOUNTER — Non-Acute Institutional Stay (SKILLED_NURSING_FACILITY): Payer: Medicare Other | Admitting: Internal Medicine

## 2017-05-12 ENCOUNTER — Encounter: Payer: Self-pay | Admitting: Internal Medicine

## 2017-05-12 DIAGNOSIS — K5909 Other constipation: Secondary | ICD-10-CM

## 2017-05-12 DIAGNOSIS — K219 Gastro-esophageal reflux disease without esophagitis: Secondary | ICD-10-CM

## 2017-05-12 DIAGNOSIS — I10 Essential (primary) hypertension: Secondary | ICD-10-CM

## 2017-05-12 LAB — CMP 10231
A1c: 5.9
ALK PHOS: 79
ALT: 17
AST: 17
Albumin: 3.6
BUN: 14 (ref 4–21)
CALCIUM: 9.2
CREATININE: 1.01
GFR CALC NON AF AMER: 64
GLUCOSE: 93
Potassium: 4.1
Sodium: 141
TOTAL PROTEIN: 6.5
Total bilirubin, fluid: 0.6

## 2017-05-12 LAB — CBC
HCT: 44
Hemoglobin: 15.4
Platelet: 133
White Blood Cells: 7.6

## 2017-05-12 NOTE — Progress Notes (Signed)
[facility]  Provider: Blanchie Serve MD   Location:  [facility] Room Number: 61 Place of Service:  SNF (31)  PCP: Blanchie Serve, MD Patient Care Team: Blanchie Serve, MD as PCP - General (Internal Medicine) Crystal Petersen Clent Demark, MD as Consulting Physician (Ophthalmology) Burnell Blanks, MD as Consulting Physician (Cardiology) Inda Castle, MD (Inactive) as Consulting Physician (Gastroenterology) Jari Pigg, MD as Consulting Physician (Dermatology) Dorna Leitz, MD as Consulting Physician (Orthopedic Surgery) Mast, Man X, NP as Nurse Practitioner (Internal Medicine)  Extended Emergency Contact Information Primary Emergency Contact: [facility] Address: [address]          [place], [place] [address] Johnnette Litter of [place] Phone: [phone] Relation: Daughter Secondary Emergency Contact: Nylee, [place], [place] [address] Johnnette Litter of [place] Phone: [phone] Relation: Son  Code Status: DNR  Goals of Care: Advanced Directive information Advanced Directives 05/12/2017  Does Patient Have a Medical Advance Directive? Yes  Type of Advance Directive Out of facility DNR (pink MOST or yellow form)  Does patient want to make changes to medical advance directive? No - Patient declined  Copy of [place] in Chart? -  Pre-existing out of facility DNR order (yellow form or pink MOST form) Yellow form placed in chart (order not valid for inpatient use)      Chief Complaint  Patient presents with  . Medical Management of Chronic Issues    Routine Visit     HPI: Patient is a 84 y.o. female seen today for routine visit. She has been at her baseline. She is pleasantly confused. She denies any concern. She ambulates with walker. No fall reported this month. Appetite is fair. She goes to dining area for meals.   Past Medical History:  Diagnosis Date  . Acute blood loss anemia 03/29/2014   03/30/14 Hgb 7.3 04/04/14 Hgb 8.5 continue Fe bid.  05/04/14 Hgb 10.8   . Benign neoplasm of colon   . Cancer ([place])    pre-melanoma  . Cystocele   . Dementia of the Alzheimer's type 05/22/2014   05/19/14 MMSE 18/30 Namenda.    . Diastolic dysfunction 2/42/3536  . Diverticulosis of colon (without mention of hemorrhage)   . Diverticulosis of large intestine 06/28/2004  . Esophageal reflux   . Essential hypertension 07/05/2013  . Gastroparesis   . Hemorrhoids   . HTN (hypertension)   . Hyperlipidemia 07/05/2013  . Hypertension   . Hypothyroidism   . Internal hemorrhoids without mention of complication 14/07/3152  . Macular degeneration   . Nonspecific abnormal electrocardiogram (ECG) (EKG)   . Osteoarthritis    right hip  . Other and unspecified hyperlipidemia   . Prediabetes 11/17/2013  . Primary osteoarthritis of right hip 03/27/2014  . Shingles   . Skin cancer (melanoma) ([place])   . Stricture and stenosis of esophagus   . Stricture and stenosis of esophagus 10/27/2007  . Vitamin D deficiency 11/17/2013   Past Surgical History:  Procedure Laterality Date  . APPENDECTOMY    . BCC resection     legs/face  . CATARACT EXTRACTION, BILATERAL    . CHOLECYSTECTOMY    . FLEXIBLE SIGMOIDOSCOPY  05/12/2011   Procedure: FLEXIBLE SIGMOIDOSCOPY;  Surgeon: Inda Castle, MD;  Location: [facility];  Service: Endoscopy;  Laterality: N/A;  . FLEXIBLE SIGMOIDOSCOPY  09/10/2011   Procedure: FLEXIBLE SIGMOIDOSCOPY;  Surgeon: Inda Castle, MD;  Location: [facility];  Service: Endoscopy;  Laterality: N/A;  . FLEXIBLE SIGMOIDOSCOPY N/A 08/16/2012   Procedure: FLEXIBLE SIGMOIDOSCOPY;  Surgeon: Inda Castle, MD;  Location: [facility];  Service: Endoscopy;  Laterality: N/A;  . FRACTURE SURGERY     left hip pin  . GALLBLADDER SURGERY  2002  . HEMORRHOID BANDING N/A 08/16/2012   Procedure: HEMORRHOID BANDING;  Surgeon: Inda Castle, MD;  Location: [facility];  Service: Endoscopy;  Laterality: N/A;    . HEMORRHOID SURGERY    . left eye surgery    . melenoma resection     right arm  . THYROIDECTOMY    . TONSILLECTOMY    . TOTAL HIP ARTHROPLASTY Right 03/27/2014   Procedure: TOTAL HIP ARTHROPLASTY ANTERIOR APPROACH;  Surgeon: Alta Corning, MD;  Location: [facility];  Service: Orthopedics;  Laterality: Right;    reports that  has never smoked. she has never used smokeless tobacco. She reports that she does not drink alcohol or use drugs. Social History   Socioeconomic History  . Marital status: Widowed    Spouse name: Not on file  . Number of children: 9  . Years of education: Not on file  . Highest education level: Not on file  Social Needs  . Financial resource strain: Not on file  . Food insecurity - worry: Not on file  . Food insecurity - inability: Not on file  . Transportation needs - medical: Not on file  . Transportation needs - non-medical: Not on file  Occupational History  . Occupation: retired Marine scientist  Tobacco Use  . Smoking status: Never Smoker  . Smokeless tobacco: Never Used  Substance and Sexual Activity  . Alcohol use: No  . Drug use: No  . Sexual activity: No  Other Topics Concern  . 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    Pt does not get regular exercise       Functional Status Survey:    Family History  Problem Relation Age of Onset  . Heart disease Father   . Hypertension Father   . Heart attack Father   . Esophageal cancer Brother   . Diabetes Daughter   . Colon cancer Neg Hx   . Malignant hyperthermia Neg Hx     Health Maintenance  Topic Date Due  . PNA vac Low Risk Adult (2 of 2 - PCV13) 07/01/2013  . TETANUS/TDAP  02/20/2022  . INFLUENZA VACCINE  Completed  . DEXA SCAN  Completed    Allergies  Allergen Reactions  . Ace Inhibitors Other (See Comments)    Elevated  potassium  . Statins Other (See Comments)    Myalgias   . Vicodin [Hydrocodone-Acetaminophen] Other (See Comments)    "felt spaced out"  . Fosamax [Alendronate Sodium] Other (See Comments)  . Metoclopramide Hcl Other (See Comments)  . Sulfa Antibiotics Rash  . Sulfonamide Derivatives Rash  . Vesicare [Solifenacin] Other (See Comments)    Outpatient Encounter Medications as of 05/12/2017  Medication Sig  . acetaminophen (TYLENOL) 500 MG tablet Take 500 mg by mouth every 8 (eight) hours as needed for mild pain or moderate pain.   Marland Kitchen aspirin EC 81 MG tablet Take 81 mg by mouth daily.  . calcium carbonate (CAL-GEST ANTACID) 500 MG chewable tablet Chew 1  tablet by mouth daily as needed for indigestion or heartburn.  . Cholecalciferol (VITAMIN D) 2000 UNITS tablet Take 4,000 Units by mouth daily.  Marland Kitchen guaiFENesin (ROBITUSSIN) 100 MG/5ML SOLN Take 10 mLs by mouth every 6 (six) hours as needed for cough or to loosen phlegm.  . labetalol (NORMODYNE) 100 MG tablet Take 100 mg by mouth 2 (two) times daily.  Marland Kitchen levothyroxine (SYNTHROID, LEVOTHROID) 100 MCG tablet Take 100 mcg by mouth daily before breakfast.  . memantine (NAMENDA XR) 28 MG CP24 24 hr capsule Take 28 mg by mouth daily.  . Multiple Vitamins-Minerals (PRESERVISION AREDS 2) CAPS Take 1 capsule by mouth 2 (two) times daily.  . nitroGLYCERIN (NITROSTAT) 0.4 MG SL tablet Place 0.4 mg under the tongue every 5 (five) minutes as needed for chest pain.  Marland Kitchen omeprazole (PRILOSEC) 20 MG capsule Take 20 mg by mouth daily.  . polyethylene glycol (MIRALAX / GLYCOLAX) packet Take 17 g by mouth every other day.   . protein supplement (RESOURCE BENEPROTEIN) POWD Take 1 scoop by mouth 2 (two) times daily.   Marland Kitchen pyrithione zinc (HEAD AND SHOULDERS) 1 % shampoo Apply 1 application topically every 3 (three) days.  . sennosides-docusate sodium (SENOKOT-S) 8.6-50 MG tablet Take 2 tablets by mouth at bedtime.    No facility-administered encounter medications on  file as of 05/12/2017.     Review of Systems  Constitutional: Negative for appetite change, chills and fever.  HENT: Positive for hearing loss. Negative for congestion, rhinorrhea and trouble swallowing.   Respiratory: Negative for cough and shortness of breath.   Cardiovascular: Negative for chest pain and palpitations.  Gastrointestinal: Negative for abdominal pain, constipation, diarrhea, nausea and vomiting.  Genitourinary: Negative for dysuria.  Musculoskeletal: Positive for gait problem.       No fall reported  Skin: Negative for rash.  Neurological: Negative for dizziness and headaches.  Hematological: Bruises/bleeds easily.  Psychiatric/Behavioral: Positive for confusion. Negative for behavioral problems.    Vitals:   05/12/17 1104  BP: (!) 152/90  Pulse: 66  Resp: 16  Temp: 98.2 F (36.8 C)  TempSrc: Oral  SpO2: 96%  Weight: 138 lb 6.4 oz (62.8 kg)  Height: 5\' 1"  (1.549 m)   Body mass index is 26.15 kg/m.   Wt Readings from Last 3 Encounters:  05/12/17 138 lb 6.4 oz (62.8 kg)  04/20/17 138 lb 6.4 oz (62.8 kg)  04/10/17 138 lb 11.2 oz (62.9 kg)   Physical Exam  Constitutional: She appears well-developed and well-nourished. No distress.  HENT:  Head: Normocephalic and atraumatic.  Mouth/Throat: Oropharynx is clear and moist. No oropharyngeal exudate.  Eyes: Conjunctivae are normal. Pupils are equal, round, and reactive to light. Right eye exhibits no discharge. Left eye exhibits no discharge.  Ectropion present  Neck: Neck supple.  Cardiovascular: Normal rate and regular rhythm.  Murmur heard. Pulmonary/Chest: Effort normal and breath sounds normal. No respiratory distress. She has no wheezes. She has no rales.  Abdominal: Soft. Bowel sounds are normal. She exhibits distension. There is no tenderness.  Musculoskeletal: She exhibits edema and deformity.  Trace leg edema, arthritis changes to her fingers, unsteady gait, uses walker  Lymphadenopathy:    She has  no cervical adenopathy.  Neurological: She is alert.  Oriented to self and month only.   Skin: Skin is warm and dry. No rash noted. She is not diaphoretic.  Psychiatric: She has a normal mood and affect.    Labs reviewed: Basic Metabolic Panel: Recent Labs  11/21/16 02/11/17 03/31/17  NA 142 142 141  K 4.1 3.4 4.1  BUN 21 21 14   CREATININE 0.8 0.8 1.01  CALCIUM  --   --  9.2   Liver Function Tests: Recent Labs    02/11/17 03/31/17  AST 13 17  ALT 8 17  ALKPHOS 75 79  PROT  --  6.5  ALBUMIN  --  3.6   No results for input(s): LIPASE, AMYLASE in the last 8760 hours. No results for input(s): AMMONIA in the last 8760 hours. CBC: Recent Labs    07/15/16 09/16/16 02/11/17 03/31/17  WBC 7.4 8.3 8.9  --   HGB 12.1 12.5 12.0  --   HCT 36 37 36 44  PLT 265 248 255 133   Cardiac Enzymes: No results for input(s): CKTOTAL, CKMB, CKMBINDEX, TROPONINI in the last 8760 hours. BNP: Invalid input(s): POCBNP Lab Results  Component Value Date   HGBA1C 5.8 (H) 07/06/2014   Lab Results  Component Value Date   TSH 0.70 02/26/2017   No results found for: VITAMINB12 No results found for: FOLATE No results found for: IRON, TIBC, FERRITIN  Lipid Panel: Recent Labs    07/15/16  CHOL 228*  HDL 55  LDLCALC 146  TRIG 141   Lab Results  Component Value Date   HGBA1C 5.8 (H) 07/06/2014    Procedures since last visit: No results found.  Assessment/Plan  Hypertension Elevated BP readings on chart review. Currently on labetalol 100 mg bid. Change this to labetalol 150 mg in evening and 100 mg in evening.   gerd With stricture and stenosis of esophagus. Denies any complaint this visit. C/w omeprazole 20 mg daily  Chronic constipation  continue senokot s 2 tab qhs and miralax qod  Labs/tests ordered:  none  Communication: reviewed care plan with patient and charge nurse.     Blanchie Serve, MD Internal Medicine [facility] [address] [place], [place] [address] Cell Phone (Monday-Friday 8 am - 5 pm): [phone] On Call: [phone] and follow prompts after 5 pm and on weekends Office Phone: [phone] Office Fax: [phone]

## 2017-05-14 DIAGNOSIS — H353222 Exudative age-related macular degeneration, left eye, with inactive choroidal neovascularization: Secondary | ICD-10-CM | POA: Diagnosis not present

## 2017-05-14 DIAGNOSIS — H35352 Cystoid macular degeneration, left eye: Secondary | ICD-10-CM | POA: Diagnosis not present

## 2017-05-14 DIAGNOSIS — H35052 Retinal neovascularization, unspecified, left eye: Secondary | ICD-10-CM | POA: Diagnosis not present

## 2017-05-14 DIAGNOSIS — H353212 Exudative age-related macular degeneration, right eye, with inactive choroidal neovascularization: Secondary | ICD-10-CM | POA: Diagnosis not present

## 2017-05-22 ENCOUNTER — Encounter: Payer: Self-pay | Admitting: Nurse Practitioner

## 2017-05-22 ENCOUNTER — Non-Acute Institutional Stay (SKILLED_NURSING_FACILITY): Payer: Medicare Other | Admitting: Nurse Practitioner

## 2017-05-22 DIAGNOSIS — F0280 Dementia in other diseases classified elsewhere without behavioral disturbance: Secondary | ICD-10-CM

## 2017-05-22 DIAGNOSIS — I10 Essential (primary) hypertension: Secondary | ICD-10-CM

## 2017-05-22 DIAGNOSIS — G301 Alzheimer's disease with late onset: Secondary | ICD-10-CM

## 2017-05-22 NOTE — Assessment & Plan Note (Signed)
Hx of dementia, resides in SNF, ambulates with walker, continue Memantine for memory.

## 2017-05-22 NOTE — Progress Notes (Signed)
Location:  [facility] Room Number: 65 Place of Service:  SNF (31) Provider:  Natasia Sanko, Manxie  NP  Blanchie Serve, MD  Patient Care Team: Blanchie Serve, MD as PCP - General (Internal Medicine) Zadie Rhine Clent Demark, MD as Consulting Physician (Ophthalmology) Burnell Blanks, MD as Consulting Physician (Cardiology) Inda Castle, MD (Inactive) as Consulting Physician (Gastroenterology) Jari Pigg, MD as Consulting Physician (Dermatology) Dorna Leitz, MD as Consulting Physician (Orthopedic Surgery) Betzaida Cremeens X, NP as Nurse Practitioner (Internal Medicine)  Extended Emergency Contact Information Primary Emergency Contact: [facility] Address: [address]          [place], [place] [address] Johnnette Litter of [place] Phone: [phone] Relation: Daughter Secondary Emergency Contact: Shondrika, [place], [place] [address] Johnnette Litter of [place] Phone: [phone] Relation: Son  Code Status:  DNR Goals of care: Advanced Directive information Advanced Directives 05/22/2017  Does Patient Have a Medical Advance Directive? Yes  Type of Advance Directive Out of facility DNR (pink MOST or yellow form)  Does patient want to make changes to medical advance directive? No - Patient declined  Copy of [place] in Chart? -  Pre-existing out of facility DNR order (yellow form or pink MOST form) Yellow form placed in chart (order not valid for inpatient use)     Chief Complaint  Patient presents with  . Acute Visit    Staff reporting that BP reading are up and down.    HPI:  Pt is a 84 y.o. female seen today for an acute visit for persisted elevated BP 160/80 in am 148/88 HR 68 in pm. Not well controlled, Bp 152/90 2/29/19, Labetalol was increased to 100mg  am 150mg  pm 05/12/17. The patient denied headache, dizziness, chest pain/pressure, palpitation, nausea, or vomiting. No change of vision noted. Hx of dementia, resides in SNF,  ambulates with walker, taking Memantine for memory.    Past Medical History:  Diagnosis Date  . Acute blood loss anemia 03/29/2014   03/30/14 Hgb 7.3 04/04/14 Hgb 8.5 continue Fe bid.  05/04/14 Hgb 10.8   . Benign neoplasm of colon   . Cancer ([place])    pre-melanoma  . Cystocele   . Dementia of the Alzheimer's type 05/22/2014   05/19/14 MMSE 18/30 Namenda.    . Diastolic dysfunction 10/31/9468  . Diverticulosis of colon (without mention of hemorrhage)   . Diverticulosis of large intestine 06/28/2004  . Esophageal reflux   . Essential hypertension 07/05/2013  . Gastroparesis   . Hemorrhoids   . HTN (hypertension)   . Hyperlipidemia 07/05/2013  . Hypertension   . Hypothyroidism   . Internal hemorrhoids without mention of complication 96/05/8364  . Macular degeneration   . Nonspecific abnormal electrocardiogram (ECG) (EKG)   . Osteoarthritis    right hip  . Other and unspecified hyperlipidemia   . Prediabetes 11/17/2013  . Primary osteoarthritis of right hip 03/27/2014  . Shingles   . Skin cancer (melanoma) ([place])   . Stricture and stenosis of esophagus   . Stricture and stenosis of esophagus 10/27/2007  . Vitamin D deficiency 11/17/2013   Past Surgical History:  Procedure Laterality Date  . APPENDECTOMY    . BCC resection     legs/face  . CATARACT EXTRACTION, BILATERAL    . CHOLECYSTECTOMY    . FLEXIBLE SIGMOIDOSCOPY  05/12/2011   Procedure: FLEXIBLE SIGMOIDOSCOPY;  Surgeon: Inda Castle, MD;  Location: [facility];  Service: Endoscopy;  Laterality: N/A;  . FLEXIBLE SIGMOIDOSCOPY  09/10/2011   Procedure: FLEXIBLE SIGMOIDOSCOPY;  Surgeon: Inda Castle, MD;  [facility];  Service: Endoscopy;  Laterality: N/A;  . FLEXIBLE SIGMOIDOSCOPY N/A 08/16/2012   Procedure: FLEXIBLE SIGMOIDOSCOPY;  Surgeon: Inda Castle, MD;  Location: [facility];  Service: Endoscopy;  Laterality: N/A;  . FRACTURE SURGERY     left hip pin  . GALLBLADDER SURGERY  2002  . HEMORRHOID BANDING N/A  08/16/2012   Procedure: HEMORRHOID BANDING;  Surgeon: Inda Castle, MD;  [facility];  Service: Endoscopy;  Laterality: N/A;  . HEMORRHOID SURGERY    . left eye surgery    . melenoma resection     right arm  . THYROIDECTOMY    . TONSILLECTOMY    . TOTAL HIP ARTHROPLASTY Right 03/27/2014   Procedure: TOTAL HIP ARTHROPLASTY ANTERIOR APPROACH;  Surgeon: Alta Corning, MD;  Location: [facility];  Service: Orthopedics;  Laterality: Right;    Allergies  Allergen Reactions  . Ace Inhibitors Other (See Comments)    Elevated potassium  . Statins Other (See Comments)    Myalgias   . Vicodin [Hydrocodone-Acetaminophen] Other (See Comments)    "felt spaced out"  . Fosamax [Alendronate Sodium] Other (See Comments)  . Metoclopramide Hcl Other (See Comments)  . Sulfa Antibiotics Rash  . Sulfonamide Derivatives Rash  . Vesicare [Solifenacin] Other (See Comments)    Outpatient Encounter Medications as of 05/22/2017  Medication Sig  . acetaminophen (TYLENOL) 500 MG tablet Take 500 mg by mouth every 8 (eight) hours as needed for mild pain or moderate pain.   Marland Kitchen aspirin EC 81 MG tablet Take 81 mg by mouth daily.  . calcium carbonate (CAL-GEST ANTACID) 500 MG chewable tablet Chew 1 tablet by mouth daily as needed for indigestion or heartburn.  . carboxymethylcellulose (REFRESH PLUS) 0.5 % SOLN Place 1 drop into both eyes daily as needed.  . Cholecalciferol (VITAMIN D) 2000 UNITS tablet Take 4,000 Units by mouth daily.  Marland Kitchen labetalol (NORMODYNE) 100 MG tablet Take 100 mg by mouth 2 (two) times daily.  Marland Kitchen levothyroxine (SYNTHROID, LEVOTHROID) 100 MCG tablet Take 100 mcg by mouth daily before breakfast.  . memantine (NAMENDA XR) 28 MG CP24 24 hr capsule Take 28 mg by mouth daily.  . Multiple Vitamins-Minerals (PRESERVISION AREDS 2) CAPS Take 1 capsule by mouth 2 (two) times daily.  . nitroGLYCERIN (NITROSTAT) 0.4 MG SL tablet Place 0.4 mg under the tongue every 5 (five) minutes as needed for chest  pain.  Marland Kitchen omeprazole (PRILOSEC) 20 MG capsule Take 20 mg by mouth daily.  . polyethylene glycol (MIRALAX / GLYCOLAX) packet Take 17 g by mouth every other day.   . protein supplement (RESOURCE BENEPROTEIN) POWD Take 1 scoop by mouth 2 (two) times daily.   Marland Kitchen pyrithione zinc (HEAD AND SHOULDERS) 1 % shampoo Apply 1 application topically every 3 (three) days.  . sennosides-docusate sodium (SENOKOT-S) 8.6-50 MG tablet Take 2 tablets by mouth at bedtime.   . [DISCONTINUED] guaiFENesin (ROBITUSSIN) 100 MG/5ML SOLN Take 10 mLs by mouth every 6 (six) hours as needed for cough or to loosen phlegm.   No facility-administered encounter medications on file as of 05/22/2017.    ROS was provided with assistance of staff Review of Systems  Constitutional: Negative for activity change, appetite change, chills, diaphoresis, fatigue and fever.  HENT: Positive for hearing loss. Negative for congestion, trouble swallowing and voice change.   Eyes: Negative for visual disturbance.  Respiratory: Negative  for cough, chest tightness, shortness of breath and wheezing.   Cardiovascular: Negative for chest pain and palpitations.  Neurological: Negative for dizziness, tremors, speech difficulty, weakness and headaches.       Dementia  Psychiatric/Behavioral: Positive for confusion. Negative for agitation, behavioral problems, hallucinations and sleep disturbance. The patient is not nervous/anxious.     Immunization History  Administered Date(s) Administered  . Influenza, High Dose Seasonal PF 12/27/2013  . Influenza-Unspecified 01/12/2013, 01/02/2015, 01/29/2016, 02/03/2017  . Pneumococcal-Unspecified 07/01/2012  . Td 05/27/2004  . Tdap 02/21/2012  . Zoster 04/15/2007   Pertinent  Health Maintenance Due  Topic Date Due  . PNA vac Low Risk Adult (2 of 2 - PCV13) 07/01/2013  . INFLUENZA VACCINE  Completed  . DEXA SCAN  Completed   Fall Risk  11/28/2016 05/22/2014 10/06/2013 06/02/2013  Falls in the past year? No No  No No  Risk for fall due to : - - History of fall(s);Impaired balance/gait;Impaired mobility Impaired mobility   Functional Status Survey:    Vitals:   05/22/17 1202  BP: 140/80  Pulse: 75  Resp: 18  Temp: 98.9 F (37.2 C)  Weight: 139 lb 3.2 oz (63.1 kg)  Height: 5\' 1"  (1.549 m)   Body mass index is 26.3 kg/m. Physical Exam  Constitutional: She appears well-developed and well-nourished. No distress.  HENT:  Head: Normocephalic and atraumatic.  Eyes: Conjunctivae and EOM are normal. Pupils are equal, round, and reactive to light.  Neck: Normal range of motion. Neck supple. No JVD present. No thyromegaly present.  Cardiovascular: Normal rate and regular rhythm.  Murmur heard. Pulmonary/Chest: Effort normal and breath sounds normal. She has no wheezes. She has no rales.  Neurological: She is alert. She exhibits normal muscle tone. Coordination normal.  Oriented to person and her room on unit.   Skin: She is not diaphoretic.  Psychiatric: She has a normal mood and affect. Her behavior is normal.    Labs reviewed: Recent Labs    11/21/16 02/11/17 03/31/17  NA 142 142 141  K 4.1 3.4 4.1  BUN 21 21 14   CREATININE 0.8 0.8 1.01  CALCIUM  --   --  9.2   Recent Labs    02/11/17 03/31/17  AST 13 17  ALT 8 17  ALKPHOS 75 79  PROT  --  6.5  ALBUMIN  --  3.6   Recent Labs    07/15/16 09/16/16 02/11/17 03/31/17  WBC 7.4 8.3 8.9  --   HGB 12.1 12.5 12.0  --   HCT 36 37 36 44  PLT 265 248 255 133   Lab Results  Component Value Date   TSH 0.70 02/26/2017   Lab Results  Component Value Date   HGBA1C 5.8 (H) 07/06/2014   Lab Results  Component Value Date   CHOL 228 (A) 07/15/2016   HDL 55 07/15/2016   LDLCALC 146 07/15/2016   TRIG 141 07/15/2016   CHOLHDL 3.2 07/06/2014    Significant Diagnostic Results in last 30 days:  No results found.  Assessment/Plan Essential hypertension persisted elevated BP 160/80 in am 148/88 HR 68 in pm. Not well controlled, Bp  152/90 2/29/19, Labetalol was increased to 100mg  am 150mg  pm 05/12/17. The patient denied headache, dizziness, chest pain/pressure, palpitation, nausea, or vomiting. No change of vision noted. Will increase Labetalol 150mg  bid, hold for SBP<110, HR,60bpm. Monitor Bp/P  Dementia of the Alzheimer's type Hx of dementia, resides in SNF, ambulates with walker, continue Memantine for memory.  Family/ staff Communication: plan of care reviewed with the patient and charge nurse.   Labs/tests ordered:  None  Time spend 25 minutes.

## 2017-05-22 NOTE — Assessment & Plan Note (Signed)
persisted elevated BP 160/80 in am 148/88 HR 68 in pm. Not well controlled, Bp 152/90 2/29/19, Labetalol was increased to 100mg  am 150mg  pm 05/12/17. The patient denied headache, dizziness, chest pain/pressure, palpitation, nausea, or vomiting. No change of vision noted. Will increase Labetalol 150mg  bid, hold for SBP<110, HR,60bpm. Monitor Bp/P

## 2017-06-09 ENCOUNTER — Non-Acute Institutional Stay (SKILLED_NURSING_FACILITY): Payer: Medicare Other | Admitting: Nurse Practitioner

## 2017-06-09 ENCOUNTER — Encounter: Payer: Self-pay | Admitting: Nurse Practitioner

## 2017-06-09 DIAGNOSIS — F0280 Dementia in other diseases classified elsewhere without behavioral disturbance: Secondary | ICD-10-CM | POA: Diagnosis not present

## 2017-06-09 DIAGNOSIS — I10 Essential (primary) hypertension: Secondary | ICD-10-CM

## 2017-06-09 DIAGNOSIS — K5900 Constipation, unspecified: Secondary | ICD-10-CM

## 2017-06-09 DIAGNOSIS — G301 Alzheimer's disease with late onset: Secondary | ICD-10-CM | POA: Diagnosis not present

## 2017-06-09 NOTE — Progress Notes (Addendum)
Location:  [facility] Room Number: 67 Place of Service:  SNF (31) Provider:  Trellis Guirguis, Manxie  NP  Blanchie Serve, MD  Patient Care Team: Blanchie Serve, MD as PCP - General (Internal Medicine) Zadie Rhine Clent Demark, MD as Consulting Physician (Ophthalmology) Burnell Blanks, MD as Consulting Physician (Cardiology) Inda Castle, MD (Inactive) as Consulting Physician (Gastroenterology) Jari Pigg, MD as Consulting Physician (Dermatology) Dorna Leitz, MD as Consulting Physician (Orthopedic Surgery) Jyasia Markoff X, NP as Nurse Practitioner (Internal Medicine)  Extended Emergency Contact Information Primary Emergency Contact: [facility] Address: [address]          [place], [place] [address] Johnnette Litter of [place] Phone: [phone] Relation: Daughter Secondary Emergency Contact: Nikia, [place], [place] [address] Johnnette Litter of [place] Phone: [phone] Relation: Son  Code Status:  DNR Goals of care: Advanced Directive information Advanced Directives 05/22/2017  Does Patient Have a Medical Advance Directive? Yes  Type of Advance Directive Out of facility DNR (pink MOST or yellow form)  Does patient want to make changes to medical advance directive? No - Patient declined  Copy of [place] in Chart? -  Pre-existing out of facility DNR order (yellow form or pink MOST form) Yellow form placed in chart (order not valid for inpatient use)     Chief Complaint  Patient presents with  . Medical Management of Chronic Issues    HPI:  Pt is a 84 y.o. female seen today for medical management of chronic diseases.     The patient has history of dementia, resides in SNF [facility], ambulates with walker, self transfer and toileting. She is taking Memantine 28mg  qd to preserve her memory. No constipation while on Senokot S II daily, MiraLax qod. Hx of HTN, blood pressure is controlled on Labetalol 150mg  bid, taking ASA 81mg  for  cardiovascular risk reduction.     Past Medical History:  Diagnosis Date  . Acute blood loss anemia 03/29/2014   03/30/14 Hgb 7.3 04/04/14 Hgb 8.5 continue Fe bid.  05/04/14 Hgb 10.8   . Benign neoplasm of colon   . Cancer ([place])    pre-melanoma  . Cystocele   . Dementia of the Alzheimer's type 05/22/2014   05/19/14 MMSE 18/30 Nam[place].    . Diastolic dysfunction 7/61/6073  . Diverticulosis of colon (without mention of hemorrhage)   . Diverticulosis of large intestine 06/28/2004  . Esophageal reflux   . Essential hypertension 07/05/2013  . Gastroparesis   . Hemorrhoids   . HTN (hypertension)   . Hyperlipidemia 07/05/2013  . Hypertension   . Hypothyroidism   . Internal hemorrhoids without mention of complication 71/0/6269  . Macular degeneration   . Nonspecific abnormal electrocardiogram (ECG) (EKG)   . Osteoarthritis    right hip  . Other and unspecified hyperlipidemia   . Prediabetes 11/17/2013  . Primary osteoarthritis of right hip 03/27/2014  . Shingles   . Skin cancer (melanoma) ([place])   . Stricture and stenosis of esophagus   . Stricture and stenosis of esophagus 10/27/2007  . Vitamin D deficiency 11/17/2013   Past Surgical History:  Procedure Laterality Date  . APPENDECTOMY    . BCC resection     legs/face  . CATARACT EXTRACTION, BILATERAL    . CHOLECYSTECTOMY    . FLEXIBLE SIGMOIDOSCOPY  05/12/2011   Procedure: FLEXIBLE SIGMOIDOSCOPY;  Surgeon: Inda Castle, MD;  [facility];  Service: Endoscopy;  Laterality: N/A;  .  FLEXIBLE SIGMOIDOSCOPY  09/10/2011   Procedure: FLEXIBLE SIGMOIDOSCOPY;  Surgeon: Inda Castle, MD;  [facility];  Service: Endoscopy;  Laterality: N/A;  . FLEXIBLE SIGMOIDOSCOPY N/A 08/16/2012   Procedure: FLEXIBLE SIGMOIDOSCOPY;  Surgeon: Inda Castle, MD;  [facility];  Service: Endoscopy;  Laterality: N/A;  . FRACTURE SURGERY     left hip pin  . GALLBLADDER SURGERY  2002  . HEMORRHOID BANDING N/A 08/16/2012    Procedure: HEMORRHOID BANDING;  Surgeon: Inda Castle, MD;  [facility];  Service: Endoscopy;  Laterality: N/A;  . HEMORRHOID SURGERY    . left eye surgery    . melenoma resection     right arm  . THYROIDECTOMY    . TONSILLECTOMY    . TOTAL HIP ARTHROPLASTY Right 03/27/2014   Procedure: TOTAL HIP ARTHROPLASTY ANTERIOR APPROACH;  Surgeon: Alta Corning, MD;  Location: [facility];  Service: Orthopedics;  Laterality: Right;    Allergies  Allergen Reactions  . Ace Inhibitors Other (See Comments)    Elevated potassium  . Statins Other (See Comments)    Myalgias   . Vicodin [Hydrocodone-Acetaminophen] Other (See Comments)    "felt spaced out"  . Fosamax [Alendronate Sodium] Other (See Comments)  . Metoclopramide Hcl Other (See Comments)  . Sulfa Antibiotics Rash  . Sulfonamide Derivatives Rash  . Vesicare [Solifenacin] Other (See Comments)    Outpatient Encounter Medications as of 06/09/2017  Medication Sig  . acetaminophen (TYLENOL) 500 MG tablet Take 500 mg by mouth every 8 (eight) hours as needed for mild pain or moderate pain.   Marland Kitchen aspirin EC 81 MG tablet Take 81 mg by mouth daily.  . calcium carbonate (CAL-GEST ANTACID) 500 MG chewable tablet Chew 1 tablet by mouth daily as needed for indigestion or heartburn.  . carboxymethylcellulose (REFRESH PLUS) 0.5 % SOLN Place 1 drop into both eyes daily as needed.  . Cholecalciferol (VITAMIN D) 2000 UNITS tablet Take 4,000 Units by mouth daily.  Marland Kitchen labetalol (NORMODYNE) 100 MG tablet Take 100 mg by mouth 2 (two) times daily.  Marland Kitchen levothyroxine (SYNTHROID, LEVOTHROID) 100 MCG tablet Take 100 mcg by mouth daily before breakfast.  . memantine (NAMENDA XR) 28 MG CP24 24 hr capsule Take 28 mg by mouth daily.  . Multiple Vitamins-Minerals (PRESERVISION AREDS 2) CAPS Take 1 capsule by mouth 2 (two) times daily.  . nitroGLYCERIN (NITROSTAT) 0.4 MG SL tablet Place 0.4 mg under the tongue every 5 (five) minutes as needed for chest pain.  Marland Kitchen  omeprazole (PRILOSEC) 20 MG capsule Take 20 mg by mouth daily.  . polyethylene glycol (MIRALAX / GLYCOLAX) packet Take 17 g by mouth every other day.   . protein supplement (RESOURCE BENEPROTEIN) POWD Take 1 scoop by mouth 2 (two) times daily.   Marland Kitchen pyrithione zinc (HEAD AND SHOULDERS) 1 % shampoo Apply 1 application topically every 3 (three) days.  . sennosides-docusate sodium (SENOKOT-S) 8.6-50 MG tablet Take 2 tablets by mouth at bedtime.    No facility-administered encounter medications on file as of 06/09/2017.    ROS was provided with assistance of staff Review of Systems  Constitutional: Negative for activity change, appetite change, chills, diaphoresis, fatigue and fever.  HENT: Positive for hearing loss. Negative for congestion and trouble swallowing.   Eyes: Negative for visual disturbance.  Respiratory: Negative for cough, choking, shortness of breath and wheezing.   Cardiovascular: Positive for leg swelling. Negative for chest pain and palpitations.  Gastrointestinal: Negative for abdominal distention, abdominal pain, constipation, diarrhea,  nausea and vomiting.  Endocrine: Negative for cold intolerance.  Genitourinary: Negative for difficulty urinating, dysuria and urgency.       Occasional urinary incontinence.   Musculoskeletal: Positive for gait problem. Negative for joint swelling.  Skin: Negative for color change and pallor.  Neurological: Negative for tremors, speech difficulty, weakness and headaches.       Dementia.   Psychiatric/Behavioral: Positive for confusion. Negative for agitation, behavioral problems, hallucinations and sleep disturbance. The patient is not nervous/anxious.     Immunization History  Administered Date(s) Administered  . Influenza, High Dose Seasonal PF 12/27/2013  . Influenza-Unspecified 01/12/2013, 01/02/2015, 01/29/2016, 02/03/2017  . Pneumococcal-Unspecified 07/01/2012  . Td 05/27/2004  . Tdap 02/21/2012  . Zoster 04/15/2007   Pertinent   Health Maintenance Due  Topic Date Due  . PNA vac Low Risk Adult (2 of 2 - PCV13) 07/01/2013  . INFLUENZA VACCINE  Completed  . DEXA SCAN  Completed   Fall Risk  11/28/2016 05/22/2014 10/06/2013 06/02/2013  Falls in the past year? No No No No  Risk for fall due to : - - History of fall(s);Impaired balance/gait;Impaired mobility Impaired mobility   Functional Status Survey:    Vitals:   06/09/17 1405  BP: 138/86  Pulse: 64  Resp: 20  Temp: 98.9 F (37.2 C)  SpO2: 97%  Weight: 139 lb 3.2 oz (63.1 kg)  Height: 5\' 1"  (1.549 m)   Body mass index is 26.3 kg/m. Physical Exam  Constitutional: She appears well-developed and well-nourished.  HENT:  Head: Normocephalic and atraumatic.  Eyes: Conjunctivae and EOM are normal. Pupils are equal, round, and reactive to light.  Neck: Normal range of motion. Neck supple. No JVD present. No thyromegaly present.  Cardiovascular: Normal rate and regular rhythm.  Murmur heard. Pulmonary/Chest: Effort normal and breath sounds normal. She has no wheezes. She has no rales.  Abdominal: Soft. Bowel sounds are normal. She exhibits no distension. There is no tenderness.  Musculoskeletal: Normal range of motion. She exhibits edema. She exhibits no tenderness.  Trace edema BLE. Self transfer and toileting, ambulates with walker.   Neurological: She is alert. She exhibits normal muscle tone. Coordination normal.  Oriented to person and her room on unit.   Skin: Skin is warm and dry. No rash noted. No erythema.  Psychiatric: She has a normal mood and affect. Her behavior is normal.    Labs reviewed: Recent Labs    11/21/16 02/11/17 03/31/17  NA 142 142 141  K 4.1 3.4 4.1  BUN 21 21 14   CREATININE 0.8 0.8 1.01  CALCIUM  --   --  9.2   Recent Labs    02/11/17 03/31/17  AST 13 17  ALT 8 17  ALKPHOS 75 79  PROT  --  6.5  ALBUMIN  --  3.6   Recent Labs    07/15/16 09/16/16 02/11/17 03/31/17  WBC 7.4 8.3 8.9  --   HGB 12.1 12.5 12.0  --   HCT 36  37 36 44  PLT 265 248 255 133   Lab Results  Component Value Date   TSH 0.70 02/26/2017   Lab Results  Component Value Date   HGBA1C 5.8 (H) 07/06/2014   Lab Results  Component Value Date   CHOL 228 (A) 07/15/2016   HDL 55 07/15/2016   LDLCALC 146 07/15/2016   TRIG 141 07/15/2016   CHOLHDL 3.2 07/06/2014    Significant Diagnostic Results in last 30 days:  No results found.  Assessment/Plan Dementia of  the Alzheimer's type The patient has history of dementia, resides in SNF [facility], ambulates with walker, self transfer and toileting. Continue Memantine 28mg  qd to preserve her memory. Last MMSE 12/30 02/12/17.   Constipation No constipation, continue Senokot S II daily, MiraLax qod.   Essential hypertension Hx of HTN, blood pressure is not well controlled, 150-160 Sbp, Dbp 80s, continue Labetalol 150mg  bid, taking ASA 81mg  for cardiovascular risk reduction, adding Lisinopril 5mg  daily, monitor Bp/P, f/u BMP one week       Family/ staff Communication: plan of care reviewed with the patient and chare nurse.   Labs/tests ordered:  BMP  Time spend 25 minutes.

## 2017-06-09 NOTE — Assessment & Plan Note (Signed)
The patient has history of dementia, resides in SNF [facility], ambulates with walker, self transfer and toileting. Continue Memantine 28mg  qd to preserve her memory. Last MMSE 12/30 02/12/17.

## 2017-06-09 NOTE — Assessment & Plan Note (Addendum)
Hx of HTN, blood pressure is not well controlled, 150-160 Sbp, Dbp 80s, continue Labetalol 150mg  bid, taking ASA 81mg  for cardiovascular risk reduction, adding Lisinopril 5mg  daily, monitor Bp/P, f/u BMP one week

## 2017-06-09 NOTE — Assessment & Plan Note (Signed)
No constipation, continue Senokot S II daily, MiraLax qod.

## 2017-06-16 ENCOUNTER — Encounter: Payer: Self-pay | Admitting: Nurse Practitioner

## 2017-06-16 ENCOUNTER — Non-Acute Institutional Stay (SKILLED_NURSING_FACILITY): Payer: Medicare Other | Admitting: Nurse Practitioner

## 2017-06-16 DIAGNOSIS — G301 Alzheimer's disease with late onset: Secondary | ICD-10-CM | POA: Diagnosis not present

## 2017-06-16 DIAGNOSIS — M25552 Pain in left hip: Secondary | ICD-10-CM | POA: Insufficient documentation

## 2017-06-16 DIAGNOSIS — F0280 Dementia in other diseases classified elsewhere without behavioral disturbance: Secondary | ICD-10-CM

## 2017-06-16 DIAGNOSIS — R102 Pelvic and perineal pain: Secondary | ICD-10-CM | POA: Diagnosis not present

## 2017-06-16 NOTE — Assessment & Plan Note (Signed)
HPI was provided with assistance of patient's daughter and charge nurse due to the patient's advanced H dementia. Continue Memantine for memory.

## 2017-06-16 NOTE — Progress Notes (Signed)
Location:   SNF [place] Room Number: 35 Place of Service:  SNF (31) Provider: Lennie Odor Aubrii Sharpless NP  Blanchie Serve, MD  Patient Care Team: Blanchie Serve, MD as PCP - General (Internal Medicine) Zadie Rhine Clent Demark, MD as Consulting Physician (Ophthalmology) Burnell Blanks, MD as Consulting Physician (Cardiology) Inda Castle, MD (Inactive) as Consulting Physician (Gastroenterology) Jari Pigg, MD as Consulting Physician (Dermatology) Dorna Leitz, MD as Consulting Physician (Orthopedic Surgery) Trishia Cuthrell X, NP as Nurse Practitioner (Internal Medicine)  Extended Emergency Contact Information Primary Emergency Contact: [facility] Address: [address]          [place], [place] [address] Johnnette Litter of [place] Phone: [phone] Relation: Daughter Secondary Emergency Contact: Janaia, [place], [place] [address] Johnnette Litter of [place] Phone: [phone] Relation: Son  Code Status: DNR Goals of care: Advanced Directive information Advanced Directives 06/16/2017  Does Patient Have a Medical Advance Directive? Yes  Type of Advance Directive Out of facility DNR (pink MOST or yellow form)  Does patient want to make changes to medical advance directive? No - Patient declined  Copy of [place] in Chart? -  Pre-existing out of facility DNR order (yellow form or pink MOST form) Yellow form placed in chart (order not valid for inpatient use)     Chief Complaint  Patient presents with  . Acute Visit    Left hip pain     HPI:  Pt is a 84 y.o. female seen today for an acute visit for limping of the left leg, complaining of the left hip pain x 1 day, unable to sit to stand/walking or even if to pick up left leg or move from side to side without pain. Pain was noted when attempted to move the left leg for the patient. The patient's POA daughter present during today's visit, HPI was provided with assistance of patient's daughter and  charge nurse due to the patient's advanced dementia.    Past Medical History:  Diagnosis Date  . Acute blood loss anemia 03/29/2014   03/30/14 Hgb 7.3 04/04/14 Hgb 8.5 continue Fe bid.  05/04/14 Hgb 10.8   . Benign neoplasm of colon   . Cancer ([place])    pre-melanoma  . Cystocele   . Dementia of the Alzheimer's type 05/22/2014   05/19/14 MMSE 18/30 [place].    . Diastolic dysfunction 7/51/0258  . Diverticulosis of colon (without mention of hemorrhage)   . Diverticulosis of large intestine 06/28/2004  . Esophageal reflux   . Essential hypertension 07/05/2013  . Gastroparesis   . Hemorrhoids   . HTN (hypertension)   . Hyperlipidemia 07/05/2013  . Hypertension   . Hypothyroidism   . Internal hemorrhoids without mention of complication 52/10/7822  . Macular degeneration   . Nonspecific abnormal electrocardiogram (ECG) (EKG)   . Osteoarthritis    right hip  . Other and unspecified hyperlipidemia   . Prediabetes 11/17/2013  . Primary osteoarthritis of right hip 03/27/2014  . Shingles   . Skin cancer (melanoma) ([place])   . Stricture and stenosis of esophagus   . Stricture and stenosis of esophagus 10/27/2007  . Vitamin D deficiency 11/17/2013   Past Surgical History:  Procedure Laterality Date  . APPENDECTOMY    . BCC resection     legs/face  . CATARACT EXTRACTION, BILATERAL    . CHOLECYSTECTOMY    . FLEXIBLE SIGMOIDOSCOPY  05/12/2011   Procedure: FLEXIBLE SIGMOIDOSCOPY;  Surgeon: Sandy Salaam  Deatra Ina, MD;  Location: [facility];  Service: Endoscopy;  Laterality: N/A;  . FLEXIBLE SIGMOIDOSCOPY  09/10/2011   Procedure: FLEXIBLE SIGMOIDOSCOPY;  Surgeon: Inda Castle, MD;  Location: [facility];  Service: Endoscopy;  Laterality: N/A;  . FLEXIBLE SIGMOIDOSCOPY N/A 08/16/2012   Procedure: FLEXIBLE SIGMOIDOSCOPY;  Surgeon: Inda Castle, MD;  Location: [facility];  Service: Endoscopy;  Laterality: N/A;  . FRACTURE SURGERY     left hip pin  . GALLBLADDER SURGERY  2002  . HEMORRHOID BANDING N/A  08/16/2012   Procedure: HEMORRHOID BANDING;  Surgeon: Inda Castle, MD;  Location: [facility];  Service: Endoscopy;  Laterality: N/A;  . HEMORRHOID SURGERY    . left eye surgery    . melenoma resection     right arm  . THYROIDECTOMY    . TONSILLECTOMY    . TOTAL HIP ARTHROPLASTY Right 03/27/2014   Procedure: TOTAL HIP ARTHROPLASTY ANTERIOR APPROACH;  Surgeon: Alta Corning, MD;  Location: [facility];  Service: Orthopedics;  Laterality: Right;    Allergies  Allergen Reactions  . Ace Inhibitors Other (See Comments)    Elevated potassium  . Statins Other (See Comments)    Myalgias   . Vicodin [Hydrocodone-Acetaminophen] Other (See Comments)    "felt spaced out"  . Fosamax [Alendronate Sodium] Other (See Comments)  . Metoclopramide Hcl Other (See Comments)  . Sulfa Antibiotics Rash  . Sulfonamide Derivatives Rash  . Vesicare [Solifenacin] Other (See Comments)    Allergies as of 06/16/2017      Reactions   Ace Inhibitors Other (See Comments)   Elevated potassium   Statins Other (See Comments)   Myalgias   Vicodin [hydrocodone-acetaminophen] Other (See Comments)   "felt spaced out"   Fosamax [alendronate Sodium] Other (See Comments)   Metoclopramide Hcl Other (See Comments)   Sulfa Antibiotics Rash   Sulfonamide Derivatives Rash   Vesicare [solifenacin] Other (See Comments)      Medication List        Accurate as of 06/16/17 11:59 PM. Always use your most recent med list.          acetaminophen 500 MG tablet Commonly known as:  TYLENOL Take 500 mg by mouth every 8 (eight) hours as needed for mild pain or moderate pain.   aspirin EC 81 MG tablet Take 81 mg by mouth daily.   CAL-GEST ANTACID 500 MG chewable tablet Generic drug:  calcium carbonate Chew 1 tablet by mouth daily as needed for indigestion or heartburn.   carboxymethylcellulose 0.5 % Soln Commonly known as:  REFRESH PLUS Place 1 drop into both eyes as needed.   labetalol 100 MG tablet Commonly known as:   NORMODYNE Take 100 mg by mouth 2 (two) times daily.   levothyroxine 100 MCG tablet Commonly known as:  SYNTHROID, LEVOTHROID Take 100 mcg by mouth daily before breakfast.   lisinopril 5 MG tablet Commonly known as:  PRINIVIL,ZESTRIL Take 5 mg by mouth daily.   memantine 28 MG Cp24 24 hr capsule Commonly known as:  NAMENDA XR Take 28 mg by mouth daily.   nitroGLYCERIN 0.4 MG SL tablet Commonly known as:  NITROSTAT Place 0.4 mg under the tongue every 5 (five) minutes as needed for chest pain.   omeprazole 20 MG capsule Commonly known as:  PRILOSEC Take 20 mg by mouth daily.   polyethylene glycol packet Commonly known as:  MIRALAX / GLYCOLAX Take 17 g by mouth every other day.   PRESERVISION AREDS 2 Caps Take 1 capsule by  mouth 2 (two) times daily.   protein supplement Powd Take 1 scoop by mouth 2 (two) times daily.   pyrithione zinc 1 % shampoo Commonly known as:  HEAD AND SHOULDERS Apply 1 application topically 2 (two) times a week.   sennosides-docusate sodium 8.6-50 MG tablet Commonly known as:  SENOKOT-S Take 2 tablets by mouth at bedtime.   TUSSIN 100 MG/5ML liquid Generic drug:  guaiFENesin Take by mouth every 6 (six) hours as needed for cough (10 mL).   Vitamin D 2000 units tablet Take 4,000 Units by mouth daily.      ROS was provided with assistance of the patient's daughter and staff Review of Systems  Constitutional: Positive for activity change. Negative for appetite change, chills, diaphoresis, fatigue and fever.  HENT: Positive for hearing loss. Negative for voice change.   Cardiovascular: Negative for palpitations and leg swelling.  Musculoskeletal: Positive for arthralgias and gait problem. Negative for joint swelling.  Skin: Negative for color change, pallor and wound.  Neurological: Negative for tremors, speech difficulty and weakness.       Dementia  Psychiatric/Behavioral: Positive for confusion. Negative for agitation, behavioral problems  and sleep disturbance. The patient is not nervous/anxious.     Immunization History  Administered Date(s) Administered  . Influenza, High Dose Seasonal PF 12/27/2013  . Influenza-Unspecified 01/12/2013, 01/02/2015, 01/29/2016, 02/03/2017  . Pneumococcal-Unspecified 07/01/2012  . Td 05/27/2004  . Tdap 02/21/2012  . Zoster 04/15/2007   Pertinent  Health Maintenance Due  Topic Date Due  . PNA vac Low Risk Adult (2 of 2 - PCV13) 07/01/2013  . INFLUENZA VACCINE  Completed  . DEXA SCAN  Completed   Fall Risk  11/28/2016 05/22/2014 10/06/2013 06/02/2013  Falls in the past year? No No No No  Risk for fall due to : - - History of fall(s);Impaired balance/gait;Impaired mobility Impaired mobility   Functional Status Survey:    Vitals:   06/16/17 1449  BP: (!) 160/90  Pulse: 64  Resp: 18  Temp: 98 F (36.7 C)  TempSrc: Oral  SpO2: 96%  Weight: 140 lb 12.8 oz (63.9 kg)  Height: 5\' 1"  (1.549 m)   Body mass index is 26.6 kg/m. Physical Exam  Constitutional: She appears well-developed and well-nourished. No distress.  HENT:  Head: Normocephalic and atraumatic.  Eyes: Conjunctivae and EOM are normal. Pupils are equal, round, and reactive to light.  Neck: Normal range of motion. Neck supple.  Cardiovascular: Normal rate and regular rhythm.  Pulmonary/Chest: Effort normal and breath sounds normal.  Abdominal: Soft. Bowel sounds are normal. She exhibits no distension. There is no tenderness.  Musculoskeletal: She exhibits tenderness. She exhibits no edema.  Left hip/groin area pain with any movement of the left leg, palpated pain in hip region.   Neurological: She is alert. She exhibits normal muscle tone. Coordination normal.  Oriented to person  Skin: Skin is warm and dry. No rash noted. She is not diaphoretic. No erythema.  Psychiatric: She has a normal mood and affect. Her behavior is normal.    Labs reviewed: Recent Labs    11/21/16 02/11/17 03/31/17  NA 142 142 141  K 4.1 3.4  4.1  BUN 21 21 14   CREATININE 0.8 0.8 1.01  CALCIUM  --   --  9.2   Recent Labs    02/11/17 03/31/17  AST 13 17  ALT 8 17  ALKPHOS 75 79  PROT  --  6.5  ALBUMIN  --  3.6   Recent Labs  07/15/16 09/16/16 02/11/17 03/31/17  WBC 7.4 8.3 8.9  --   HGB 12.1 12.5 12.0  --   HCT 36 37 36 44  PLT 265 248 255 133   Lab Results  Component Value Date   TSH 0.70 02/26/2017   Lab Results  Component Value Date   HGBA1C 5.8 (H) 07/06/2014   Lab Results  Component Value Date   CHOL 228 (A) 07/15/2016   HDL 55 07/15/2016   LDLCALC 146 07/15/2016   TRIG 141 07/15/2016   CHOLHDL 3.2 07/06/2014    Significant Diagnostic Results in last 30 days:  No results found.  Assessment/Plan: Left hip pain limping of the left leg, complaining of the left hip pain x 1 day, unable to sit to stand/walking or even if to pick up left leg or move from side to side without pain. Pain was noted when attempted to move the left leg for the patient. The patient's POA daughter present during today's visit. Will immobilize the patient until fxs ruled out, Tylenol 650mg  q8h x one week, X-ray pelvis, X-ray left hip 3 views.   Dementia of the Alzheimer's type HPI was provided with assistance of patient's daughter and charge nurse due to the patient's advanced H dementia. Continue Memantine for memory.       Family/ staff Communication: plan of care reviewed with the patient, patient's POA daughter, and charge nurse.   Labs/tests ordered: X-ray pelvis, left hip ap, lateral, and oblique views  Time spend 25 minutes.

## 2017-06-16 NOTE — Assessment & Plan Note (Signed)
limping of the left leg, complaining of the left hip pain x 1 day, unable to sit to stand/walking or even if to pick up left leg or move from side to side without pain. Pain was noted when attempted to move the left leg for the patient. The patient's POA daughter present during today's visit. Will immobilize the patient until fxs ruled out, Tylenol 650mg  q8h x one week, X-ray pelvis, X-ray left hip 3 views.

## 2017-06-18 ENCOUNTER — Other Ambulatory Visit: Payer: Self-pay | Admitting: *Deleted

## 2017-06-18 LAB — BASIC METABOLIC PANEL
BUN: 23 — AB (ref 4–21)
Creatinine: 0.7 (ref <=1.1)
Glucose: 103
Potassium: 3.2 — AB (ref 3.4–5.3)
Sodium: 142 (ref 137–147)

## 2017-07-07 ENCOUNTER — Non-Acute Institutional Stay (SKILLED_NURSING_FACILITY): Payer: Medicare Other | Admitting: Nurse Practitioner

## 2017-07-07 DIAGNOSIS — F0280 Dementia in other diseases classified elsewhere without behavioral disturbance: Secondary | ICD-10-CM

## 2017-07-07 DIAGNOSIS — I10 Essential (primary) hypertension: Secondary | ICD-10-CM | POA: Diagnosis not present

## 2017-07-07 DIAGNOSIS — K219 Gastro-esophageal reflux disease without esophagitis: Secondary | ICD-10-CM | POA: Diagnosis not present

## 2017-07-07 DIAGNOSIS — K5900 Constipation, unspecified: Secondary | ICD-10-CM | POA: Diagnosis not present

## 2017-07-07 DIAGNOSIS — E039 Hypothyroidism, unspecified: Secondary | ICD-10-CM | POA: Diagnosis not present

## 2017-07-07 DIAGNOSIS — G301 Alzheimer's disease with late onset: Secondary | ICD-10-CM

## 2017-07-07 NOTE — Assessment & Plan Note (Signed)
GERD stable, continue Omeprazole 20mg daily 

## 2017-07-07 NOTE — Assessment & Plan Note (Signed)
No constipation, continue Senolot S II qhs, MiraLax daily.

## 2017-07-07 NOTE — Assessment & Plan Note (Signed)
The patient has dementia, resides in SNF [facility], last MMSE 12/30 11/18, continue Memantine 28mg  daily for memory, she transfer self, ambulates with walk without pain.

## 2017-07-07 NOTE — Assessment & Plan Note (Signed)
HTN blood pressure is controlled, continue Lisinopril 5mg , Labetalol 100mg  bid.

## 2017-07-07 NOTE — Progress Notes (Signed)
Location:   SNF [facility] Room Number: 35 Place of Service:  SNF (31) Provider: Lennie Odor Caylan Chenard NP  Blanchie Serve, MD  Patient Care Team: Blanchie Serve, MD as PCP - General (Internal Medicine) Zadie Rhine Clent Demark, MD as Consulting Physician (Ophthalmology) Burnell Blanks, MD as Consulting Physician (Cardiology) Inda Castle, MD (Inactive) as Consulting Physician (Gastroenterology) Jari Pigg, MD as Consulting Physician (Dermatology) Dorna Leitz, MD as Consulting Physician (Orthopedic Surgery) Pratyush Ammon X, NP as Nurse Practitioner (Internal Medicine)  Extended Emergency Contact Information Primary Emergency Contact: [facility] Address: [address]          [place], [place] [address] Johnnette Litter of [place] Phone: [phone] Relation: Daughter Secondary Emergency Contact: Latrina, [place], [place] [address] Johnnette Litter of [place] Phone: [phone] Relation: Son  Code Status:  DNR Goals of care: Advanced Directive information Advanced Directives 06/16/2017  Does Patient Have a Medical Advance Directive? Yes  Type of Advance Directive Out of facility DNR (pink MOST or yellow form)  Does patient want to make changes to medical advance directive? No - Patient declined  Copy of [place] in Chart? -  Pre-existing out of facility DNR order (yellow form or pink MOST form) Yellow form placed in chart (order not valid for inpatient use)     Chief Complaint  Patient presents with  . Medical Management of Chronic Issues    F/U- (L) hip pain, Alzheimers    HPI:  Pt is a 84 y.o. female seen today for medical management of chronic diseases.    The patient has dementia, resides in SNF [facility], on Memantine 28mg  daily for memory, she transfer self, ambulates with walk without pain. HTN blood pressure is controlled on Lisinopril 5mg , Labetalol 100mg  bid. No constipation, taking Senolot S II qhs, MiraLax daily. GERD stable on Omeprazole  20mg  daily. Hypothyroidism, takign Levothyroxine 138mcg daily, last TSH 0.7 02/26/17   Past Medical History:  Diagnosis Date  . Acute blood loss anemia 03/29/2014   03/30/14 Hgb 7.3 04/04/14 Hgb 8.5 continue Fe bid.  05/04/14 Hgb 10.8   . Benign neoplasm of colon   . Cancer ([place])    pre-melanoma  . Cystocele   . Dementia of the Alzheimer's type 05/22/2014   05/19/14 MMSE 18/30 [place].    . Diastolic dysfunction 0/01/2724  . Diverticulosis of colon (without mention of hemorrhage)   . Diverticulosis of large intestine 06/28/2004  . Esophageal reflux   . Essential hypertension 07/05/2013  . Gastroparesis   . Hemorrhoids   . HTN (hypertension)   . Hyperlipidemia 07/05/2013  . Hypertension   . Hypothyroidism   . Internal hemorrhoids without mention of complication 36/09/4401  . Macular degeneration   . Nonspecific abnormal electrocardiogram (ECG) (EKG)   . Osteoarthritis    right hip  . Other and unspecified hyperlipidemia   . Prediabetes 11/17/2013  . Primary osteoarthritis of right hip 03/27/2014  . Shingles   . Skin cancer (melanoma) ([place])   . Stricture and stenosis of esophagus   . Stricture and stenosis of esophagus 10/27/2007  . Vitamin D deficiency 11/17/2013   Past Surgical History:  Procedure Laterality Date  . APPENDECTOMY    . BCC resection     legs/face  . CATARACT EXTRACTION, BILATERAL    . CHOLECYSTECTOMY    . FLEXIBLE SIGMOIDOSCOPY  05/12/2011   Procedure: FLEXIBLE SIGMOIDOSCOPY;  Surgeon: Inda Castle, MD;  [facility]: [facility];  Service:  Endoscopy;  Laterality: N/A;  . FLEXIBLE SIGMOIDOSCOPY  09/10/2011   Procedure: FLEXIBLE SIGMOIDOSCOPY;  Surgeon: Inda Castle, MD;  [facility];  Service: Endoscopy;  Laterality: N/A;  . FLEXIBLE SIGMOIDOSCOPY N/A 08/16/2012   Procedure: FLEXIBLE SIGMOIDOSCOPY;  Surgeon: Inda Castle, MD;  [facility];  Service: Endoscopy;  Laterality: N/A;  . FRACTURE SURGERY     left hip pin  . GALLBLADDER SURGERY   2002  . HEMORRHOID BANDING N/A 08/16/2012   Procedure: HEMORRHOID BANDING;  Surgeon: Inda Castle, MD;  [facility];  Service: Endoscopy;  Laterality: N/A;  . HEMORRHOID SURGERY    . left eye surgery    . melenoma resection     right arm  . THYROIDECTOMY    . TONSILLECTOMY    . TOTAL HIP ARTHROPLASTY Right 03/27/2014   Procedure: TOTAL HIP ARTHROPLASTY ANTERIOR APPROACH;  Surgeon: Alta Corning, MD;  Location: [facility];  Service: Orthopedics;  Laterality: Right;    Allergies  Allergen Reactions  . Ace Inhibitors Other (See Comments)    Elevated potassium  . Statins Other (See Comments)    Myalgias   . Vicodin [Hydrocodone-Acetaminophen] Other (See Comments)    "felt spaced out"  . Fosamax [Alendronate Sodium] Other (See Comments)  . Metoclopramide Hcl Other (See Comments)  . Sulfa Antibiotics Rash  . Sulfonamide Derivatives Rash  . Vesicare [Solifenacin] Other (See Comments)    Allergies as of 07/07/2017      Reactions   Ace Inhibitors Other (See Comments)   Elevated potassium   Statins Other (See Comments)   Myalgias   Vicodin [hydrocodone-acetaminophen] Other (See Comments)   "felt spaced out"   Fosamax [alendronate Sodium] Other (See Comments)   Metoclopramide Hcl Other (See Comments)   Sulfa Antibiotics Rash   Sulfonamide Derivatives Rash   Vesicare [solifenacin] Other (See Comments)      Medication List        Accurate as of 07/07/17 11:59 PM. Always use your most recent med list.          acetaminophen 500 MG tablet Commonly known as:  TYLENOL Take 500 mg by mouth every 8 (eight) hours as needed for mild pain or moderate pain.   aspirin EC 81 MG tablet Take 81 mg by mouth daily.   CAL-GEST ANTACID 500 MG chewable tablet Generic drug:  calcium carbonate Chew 1 tablet by mouth daily as needed for indigestion or heartburn.   carboxymethylcellulose 0.5 % Soln Commonly known as:  REFRESH PLUS Place 1 drop into both eyes as needed.   labetalol  100 MG tablet Commonly known as:  NORMODYNE Take 100 mg by mouth 2 (two) times daily.   levothyroxine 100 MCG tablet Commonly known as:  SYNTHROID, LEVOTHROID Take 100 mcg by mouth daily before breakfast.   lisinopril 5 MG tablet Commonly known as:  PRINIVIL,ZESTRIL Take 5 mg by mouth daily.   memantine 28 MG Cp24 24 hr capsule Commonly known as:  NAMENDA XR Take 28 mg by mouth daily.   nitroGLYCERIN 0.4 MG SL tablet Commonly known as:  NITROSTAT Place 0.4 mg under the tongue every 5 (five) minutes as needed for chest pain.   omeprazole 20 MG capsule Commonly known as:  PRILOSEC Take 20 mg by mouth daily.   polyethylene glycol packet Commonly known as:  MIRALAX / GLYCOLAX Take 17 g by mouth every other day.   PRESERVISION AREDS 2 Caps Take 1 capsule by mouth 2 (two) times daily.   protein  supplement Powd Take 1 scoop by mouth 2 (two) times daily.   pyrithione zinc 1 % shampoo Commonly known as:  HEAD AND SHOULDERS Apply 1 application topically 2 (two) times a week.   sennosides-docusate sodium 8.6-50 MG tablet Commonly known as:  SENOKOT-S Take 2 tablets by mouth at bedtime.   TUSSIN 100 MG/5ML liquid Generic drug:  guaiFENesin Take by mouth every 6 (six) hours as needed for cough (10 mL).   Vitamin D 2000 units tablet Take 4,000 Units by mouth daily.      ROS was provided with assistance of staff Review of Systems  Constitutional: Negative for activity change, appetite change, chills, diaphoresis, fatigue and fever.  HENT: Positive for hearing loss. Negative for congestion, trouble swallowing and voice change.   Respiratory: Negative for cough, choking, chest tightness, shortness of breath and wheezing.   Cardiovascular: Positive for leg swelling. Negative for chest pain.  Gastrointestinal: Negative for abdominal distention, abdominal pain, constipation, diarrhea, nausea and vomiting.  Genitourinary: Negative for difficulty urinating, dysuria and urgency.        Urinary leakage.   Musculoskeletal: Positive for gait problem. Negative for arthralgias and joint swelling.  Skin: Negative for color change and pallor.  Neurological: Negative for dizziness, speech difficulty, weakness and headaches.       Dementia.   Psychiatric/Behavioral: Positive for confusion. Negative for agitation, behavioral problems, hallucinations and sleep disturbance. The patient is not nervous/anxious.     Immunization History  Administered Date(s) Administered  . Influenza, High Dose Seasonal PF 12/27/2013  . Influenza-Unspecified 01/12/2013, 01/02/2015, 01/29/2016, 02/03/2017  . Pneumococcal-Unspecified 07/01/2012  . Td 05/27/2004  . Tdap 02/21/2012  . Zoster 04/15/2007   Pertinent  Health Maintenance Due  Topic Date Due  . PNA vac Low Risk Adult (2 of 2 - PCV13) 07/01/2013  . INFLUENZA VACCINE  Completed  . DEXA SCAN  Completed   Fall Risk  11/28/2016 05/22/2014 10/06/2013 06/02/2013  Falls in the past year? No No No No  Risk for fall due to : - - History of fall(s);Impaired balance/gait;Impaired mobility Impaired mobility   Functional Status Survey:    Vitals:   07/07/17 1401  BP: 130/68  Pulse: 74  Resp: 18  Temp: 98.4 F (36.9 C)  Weight: 140 lb 12.8 oz (63.9 kg)  Height: 5\' 1"  (1.549 m)   Body mass index is 26.6 kg/m. Physical Exam  Constitutional: She appears well-developed and well-nourished.  HENT:  Head: Normocephalic and atraumatic.  Eyes: Pupils are equal, round, and reactive to light. Conjunctivae and EOM are normal.  Neck: Normal range of motion. Neck supple. No JVD present. No thyromegaly present.  Cardiovascular: Normal rate and regular rhythm.  No murmur heard. Pulmonary/Chest: Effort normal and breath sounds normal. She has no wheezes. She has no rales.  Abdominal: Soft. Bowel sounds are normal. She exhibits no distension. There is no tenderness.  Musculoskeletal: She exhibits edema. She exhibits no tenderness.  Trace edema BLE    Neurological: She is alert. She exhibits normal muscle tone. Coordination normal.  Oriented to person and her room on unit.   Skin: Skin is warm and dry. No rash noted. No erythema.  Psychiatric: She has a normal mood and affect. Her behavior is normal.    Labs reviewed: Recent Labs    02/11/17 03/31/17 06/18/17  NA 142 141 142  K 3.4 4.1 3.2*  BUN 21 14 23*  CREATININE 0.8 1.01 0.7  CALCIUM  --  9.2  --    Recent  Labs    02/11/17 03/31/17  AST 13 17  ALT 8 17  ALKPHOS 75 79  PROT  --  6.5  ALBUMIN  --  3.6   Recent Labs    07/15/16 09/16/16 02/11/17 03/31/17  WBC 7.4 8.3 8.9  --   HGB 12.1 12.5 12.0  --   HCT 36 37 36 44  PLT 265 248 255 133   Lab Results  Component Value Date   TSH 0.70 02/26/2017   Lab Results  Component Value Date   HGBA1C 5.8 (H) 07/06/2014   Lab Results  Component Value Date   CHOL 228 (A) 07/15/2016   HDL 55 07/15/2016   LDLCALC 146 07/15/2016   TRIG 141 07/15/2016   CHOLHDL 3.2 07/06/2014    Significant Diagnostic Results in last 30 days:  No results found.  Assessment/Plan  Hypothyroidism  Hypothyroidism, continue Levothyroxine 125mcg daily, last TSH 0.7 02/26/17     Essential hypertension HTN blood pressure is controlled, continue Lisinopril 5mg , Labetalol 100mg  bid.   GERD GERD stable, continue Omeprazole 20mg  daily.  Dementia of the Alzheimer's type The patient has dementia, resides in SNF [facility], last MMSE 12/30 11/18, continue Memantine 28mg  daily for memory, she transfer self, ambulates with walk without pain.   Constipation No constipation, continue Senolot S II qhs, MiraLax daily.    Family/ staff Communication: plan of care reviewed with the patient and charge nurse.   Labs/tests ordered:  none  Time spend 25 minutes

## 2017-07-07 NOTE — Assessment & Plan Note (Signed)
Hypothyroidism, continue Levothyroxine 155mcg daily, last TSH 0.7 02/26/17

## 2017-08-03 ENCOUNTER — Encounter: Payer: Self-pay | Admitting: Nurse Practitioner

## 2017-08-03 ENCOUNTER — Non-Acute Institutional Stay (SKILLED_NURSING_FACILITY): Payer: Medicare Other | Admitting: Nurse Practitioner

## 2017-08-03 DIAGNOSIS — G301 Alzheimer's disease with late onset: Secondary | ICD-10-CM | POA: Diagnosis not present

## 2017-08-03 DIAGNOSIS — I10 Essential (primary) hypertension: Secondary | ICD-10-CM | POA: Diagnosis not present

## 2017-08-03 DIAGNOSIS — F0280 Dementia in other diseases classified elsewhere without behavioral disturbance: Secondary | ICD-10-CM

## 2017-08-03 NOTE — Progress Notes (Signed)
Location:  [place] Room Number: 24 Place of Service:  SNF (31) Provider:  Mast, ManXie  NP  Blanchie Serve, MD  Patient Care Team: Blanchie Serve, MD as PCP - General (Internal Medicine) Zadie Rhine Clent Demark, MD as Consulting Physician (Ophthalmology) Burnell Blanks, MD as Consulting Physician (Cardiology) Inda Castle, MD (Inactive) as Consulting Physician (Gastroenterology) Jari Pigg, MD as Consulting Physician (Dermatology) Dorna Leitz, MD as Consulting Physician (Orthopedic Surgery) Mast, Man X, NP as Nurse Practitioner (Internal Medicine)  Extended Emergency Contact Information Primary Emergency Contact: [facility] Address: [address]          [place], [place] [address] Johnnette Litter of [place] Phone: [phone] Relation: Daughter Secondary Emergency Contact: Arra, [place], [place] [address] Johnnette Litter of [place] Phone: [phone] Relation: Son  Code Status:  DNR Goals of care: Advanced Directive information Advanced Directives 08/03/2017  Does Patient Have a Medical Advance Directive? Yes  Type of Advance Directive Out of facility DNR (pink MOST or yellow form)  Does patient want to make changes to medical advance directive? No - Patient declined  Copy of [place] in Chart? -  Pre-existing out of facility DNR order (yellow form or pink MOST form) Yellow form placed in chart (order not valid for inpatient use)     Chief Complaint  Patient presents with  . Acute Visit    ? UTI    HPI:  Pt is a 84 y.o. female seen today for an acute visit for family requested the patient to be checked for UTI since the patient was trying to leave to find daughters. Her daughter reported the patient called her about 8x when she was at church and when she does this she usually has a UTI. The patient has history of dementia, resides in SN[facility], on Memantine 28mg  qd for memory, she transfer self and  ambulates with walker. MMSE 12/30 02/12/17. She is afebrile, denied dysuria, urinary urgency/frequency, or increased urinary incontinence. She denied lower abd or back pain. No nausea, vomiting, or fever noted. Her blood pressure was noted elevated 150/96, she , denied HA, dizziness, chest pain/pressures, palpitation, or SOB, taking Lisinopril 5mg  qd, Labetalol 100mg  bid   Past Medical History:  Diagnosis Date  . Acute blood loss anemia 03/29/2014   03/30/14 Hgb 7.3 04/04/14 Hgb 8.5 continue Fe bid.  05/04/14 Hgb 10.8   . Benign neoplasm of colon   . Cancer ([place])    pre-melanoma  . Cystocele   . Dementia of the Alzheimer's type 05/22/2014   05/19/14 MMSE 18/30 Namenda.    . Diastolic dysfunction 0/25/4270  . Diverticulosis of colon (without mention of hemorrhage)   . Diverticulosis of large intestine 06/28/2004  . Esophageal reflux   . Essential hypertension 07/05/2013  . Gastroparesis   . Hemorrhoids   . HTN (hypertension)   . Hyperlipidemia 07/05/2013  . Hypertension   . Hypothyroidism   . Internal hemorrhoids without mention of complication 62/06/7626  . Macular degeneration   . Nonspecific abnormal electrocardiogram (ECG) (EKG)   . Osteoarthritis    right hip  . Other and unspecified hyperlipidemia   . Prediabetes 11/17/2013  . Primary osteoarthritis of right hip 03/27/2014  . Shingles   . Skin cancer (melanoma) ([place])   . Stricture and stenosis of esophagus   . Stricture and stenosis of esophagus 10/27/2007  . Vitamin D deficiency 11/17/2013   Past Surgical History:  Procedure Laterality Date  . APPENDECTOMY    . BCC resection     legs/face  . CATARACT EXTRACTION, BILATERAL    . CHOLECYSTECTOMY    . FLEXIBLE SIGMOIDOSCOPY  05/12/2011   Procedure: FLEXIBLE SIGMOIDOSCOPY;  Surgeon: Inda Castle, MD;  [facility]: [facility];  Service: Endoscopy;  Laterality: N/A;  . FLEXIBLE SIGMOIDOSCOPY  09/10/2011   Procedure: FLEXIBLE SIGMOIDOSCOPY;  Surgeon: Inda Castle, MD;  [facility];  Service: Endoscopy;  Laterality: N/A;  . FLEXIBLE SIGMOIDOSCOPY N/A 08/16/2012   Procedure: FLEXIBLE SIGMOIDOSCOPY;  Surgeon: Inda Castle, MD;  [facility];  Service: Endoscopy;  Laterality: N/A;  . FRACTURE SURGERY     left hip pin  . GALLBLADDER SURGERY  2002  . HEMORRHOID BANDING N/A 08/16/2012   Procedure: HEMORRHOID BANDING;  Surgeon: Inda Castle, MD;  [facility];  Service: Endoscopy;  Laterality: N/A;  . HEMORRHOID SURGERY    . left eye surgery    . melenoma resection     right arm  . THYROIDECTOMY    . TONSILLECTOMY    . TOTAL HIP ARTHROPLASTY Right 03/27/2014   Procedure: TOTAL HIP ARTHROPLASTY ANTERIOR APPROACH;  Surgeon: Alta Corning, MD;  Location: [place];  Service: Orthopedics;  Laterality: Right;    Allergies  Allergen Reactions  . Ace Inhibitors Other (See Comments)    Elevated potassium  . Statins Other (See Comments)    Myalgias   . Vicodin [Hydrocodone-Acetaminophen] Other (See Comments)    "felt spaced out"  . Fosamax [Alendronate Sodium] Other (See Comments)  . Metoclopramide Hcl Other (See Comments)  . Sulfa Antibiotics Rash  . Sulfonamide Derivatives Rash  . Vesicare [Solifenacin] Other (See Comments)    Outpatient Encounter Medications as of 08/03/2017  Medication Sig  . acetaminophen (TYLENOL) 500 MG tablet Take 500 mg by mouth every 8 (eight) hours as needed for mild pain or moderate pain.   Marland Kitchen aspirin EC 81 MG tablet Take 81 mg by mouth daily.  . calcium carbonate (CAL-GEST ANTACID) 500 MG chewable tablet Chew 1 tablet by mouth daily as needed for indigestion or heartburn.  . carboxymethylcellulose (REFRESH PLUS) 0.5 % SOLN Place 1 drop into both eyes as needed.   . Cholecalciferol (VITAMIN D) 2000 UNITS tablet Take 4,000 Units by mouth daily.  Marland Kitchen guaiFENesin (TUSSIN) 100 MG/5ML liquid Take by mouth every 6 (six) hours as needed for cough (10 mL).  Marland Kitchen labetalol (NORMODYNE) 100 MG tablet Take 100 mg by mouth 2 (two)  times daily.  Marland Kitchen levothyroxine (SYNTHROID, LEVOTHROID) 100 MCG tablet Take 100 mcg by mouth daily before breakfast.  . lisinopril (PRINIVIL,ZESTRIL) 5 MG tablet Take 5 mg by mouth daily.  . memantine (NAMENDA XR) 28 MG CP24 24 hr capsule Take 28 mg by mouth daily.  . Multiple Vitamins-Minerals (PRESERVISION AREDS 2) CAPS Take 1 capsule by mouth 2 (two) times daily.  Marland Kitchen omeprazole (PRILOSEC) 20 MG capsule Take 20 mg by mouth daily.  . polyethylene glycol (MIRALAX / GLYCOLAX) packet Take 17 g by mouth every other day.   . protein supplement (RESOURCE BENEPROTEIN) POWD Take 1 scoop by mouth 2 (two) times daily.   Marland Kitchen pyrithione zinc (HEAD AND SHOULDERS) 1 % shampoo Apply 1 application topically 2 (two) times a week.   . sennosides-docusate sodium (SENOKOT-S) 8.6-50 MG tablet Take 2 tablets by mouth at bedtime.   . [DISCONTINUED] nitroGLYCERIN (NITROSTAT) 0.4 MG SL tablet Place 0.4 mg under the tongue every 5 (five) minutes as  needed for chest pain.   No facility-administered encounter medications on file as of 08/03/2017.    ROS was provided with assistance of staff Review of Systems  Constitutional: Negative for activity change, appetite change, chills, diaphoresis, fatigue and fever.  HENT: Positive for hearing loss. Negative for congestion, trouble swallowing and voice change.   Respiratory: Negative for cough, shortness of breath and wheezing.   Cardiovascular: Positive for leg swelling. Negative for chest pain and palpitations.  Gastrointestinal: Negative for abdominal distention, abdominal pain, constipation, diarrhea, nausea and vomiting.  Genitourinary: Negative for difficulty urinating, dysuria and urgency.  Musculoskeletal: Positive for gait problem.  Skin: Negative for color change and pallor.  Neurological: Negative for speech difficulty, weakness and headaches.       Dementia  Psychiatric/Behavioral: Positive for confusion. Negative for agitation, behavioral problems, hallucinations  and sleep disturbance. The patient is not nervous/anxious.     Immunization History  Administered Date(s) Administered  . Influenza, High Dose Seasonal PF 12/27/2013  . Influenza-Unspecified 01/12/2013, 01/02/2015, 01/29/2016, 02/03/2017  . Pneumococcal-Unspecified 07/01/2012  . Td 05/27/2004  . Tdap 02/21/2012  . Zoster 04/15/2007   Pertinent  Health Maintenance Due  Topic Date Due  . PNA vac Low Risk Adult (2 of 2 - PCV13) 07/01/2013  . INFLUENZA VACCINE  11/12/2017  . DEXA SCAN  Completed   Fall Risk  11/28/2016 05/22/2014 10/06/2013 06/02/2013  Falls in the past year? No No No No  Risk for fall due to : - - History of fall(s);Impaired balance/gait;Impaired mobility Impaired mobility   Functional Status Survey:    Vitals:   08/03/17 0951  BP: (!) 150/96  Pulse: 88  Resp: 20  Temp: 99.4 F (37.4 C)  SpO2: 97%  Weight: 139 lb 9.6 oz (63.3 kg)  Height: 5\' 1"  (1.549 m)   Body mass index is 26.38 kg/m. Physical Exam  Constitutional: She appears well-developed and well-nourished.  HENT:  Head: Normocephalic and atraumatic.  Eyes: Pupils are equal, round, and reactive to light. EOM are normal.  Neck: Normal range of motion. Neck supple.  Cardiovascular: Normal rate and regular rhythm.  Pulmonary/Chest: Effort normal. She has no wheezes. She has rales.  Posterior left basilar rales.   Abdominal: Soft. Bowel sounds are normal. She exhibits no distension. There is no tenderness. There is no rebound and no guarding.  Musculoskeletal: She exhibits edema.  Trace edema in ankles, self transfer, ambulates with walker.   Neurological: She is alert. No cranial nerve deficit. She exhibits normal muscle tone. Coordination normal.  Oriented to person and her room on unit.   Skin: Skin is warm and dry.  Psychiatric: She has a normal mood and affect. Her behavior is normal.    Labs reviewed: Recent Labs    02/11/17 03/31/17 06/18/17  NA 142 141 142  K 3.4 4.1 3.2*  BUN 21 14 23*    CREATININE 0.8 1.01 0.7  CALCIUM  --  9.2  --    Recent Labs    02/11/17 03/31/17  AST 13 17  ALT 8 17  ALKPHOS 75 79  PROT  --  6.5  ALBUMIN  --  3.6   Recent Labs    09/16/16 02/11/17 03/31/17  WBC 8.3 8.9  --   HGB 12.5 12.0  --   HCT 37 36 44  PLT 248 255 133   Lab Results  Component Value Date   TSH 0.70 02/26/2017   Lab Results  Component Value Date   HGBA1C 5.8 (H) 07/06/2014  Lab Results  Component Value Date   CHOL 228 (A) 07/15/2016   HDL 55 07/15/2016   LDLCALC 146 07/15/2016   TRIG 141 07/15/2016   CHOLHDL 3.2 07/06/2014    Significant Diagnostic Results in last 30 days:  No results found.  Assessment/Plan Dementia of the Alzheimer's type Family requested the patient to be checked for UTI since the patient was trying to leave to find daughters. Her daughter reported the patient called her about 8x when she was at church and when she does this she usually has a UTI. The patient has history of dementia, resides in SN[facility], on Memantine 28mg  qd for memory, she transfer self and ambulates with walker. MMSE 12/30 02/12/17. She is afebrile, denied dysuria, urinary urgency/frequency, or increased urinary incontinence. She denied lower abd or back pain. No nausea, vomiting, or fever noted. Will update CBC/diff, monitor for s/s of UTI, VS q shift x 72hours.   Essential hypertension Mildly elevated, denied HA, dizziness, chest pain/pressures, palpitation, or SOB, continue Lisinopril 5mg  qd, Labetalol 100mg  bid. Monitor VS q shift x 72 hours.      Family/ staff Communication: plan of care reviewed with the patient and charge nurse.   Labs/tests ordered:  CBC/diff  Time spend 25 minutes

## 2017-08-03 NOTE — Assessment & Plan Note (Signed)
Mildly elevated, denied HA, dizziness, chest pain/pressures, palpitation, or SOB, continue Lisinopril 5mg  qd, Labetalol 100mg  bid. Monitor VS q shift x 72 hours.

## 2017-08-03 NOTE — Assessment & Plan Note (Signed)
Family requested the patient to be checked for UTI since the patient was trying to leave to find daughters. Her daughter reported the patient called her about 8x when she was at church and when she does this she usually has a UTI. The patient has history of dementia, resides in SN[facility], on Memantine 28mg  qd for memory, she transfer self and ambulates with walker. MMSE 12/30 02/12/17. She is afebrile, denied dysuria, urinary urgency/frequency, or increased urinary incontinence. She denied lower abd or back pain. No nausea, vomiting, or fever noted. Will update CBC/diff, monitor for s/s of UTI, VS q shift x 72hours.

## 2017-08-04 DIAGNOSIS — R300 Dysuria: Secondary | ICD-10-CM | POA: Diagnosis not present

## 2017-08-04 LAB — CBC AND DIFFERENTIAL
HCT: 37 (ref 36–46)
Hemoglobin: 12.4 (ref 12.0–16.0)
Neutrophils Absolute: 6439
PLATELETS: 250 (ref 150–399)
WBC: 9.4

## 2017-08-07 ENCOUNTER — Non-Acute Institutional Stay (SKILLED_NURSING_FACILITY): Payer: Medicare Other | Admitting: Internal Medicine

## 2017-08-07 ENCOUNTER — Encounter: Payer: Self-pay | Admitting: Internal Medicine

## 2017-08-07 DIAGNOSIS — K219 Gastro-esophageal reflux disease without esophagitis: Secondary | ICD-10-CM | POA: Diagnosis not present

## 2017-08-07 DIAGNOSIS — K5730 Diverticulosis of large intestine without perforation or abscess without bleeding: Secondary | ICD-10-CM

## 2017-08-07 DIAGNOSIS — I5032 Chronic diastolic (congestive) heart failure: Secondary | ICD-10-CM

## 2017-08-07 DIAGNOSIS — G301 Alzheimer's disease with late onset: Secondary | ICD-10-CM

## 2017-08-07 DIAGNOSIS — E876 Hypokalemia: Secondary | ICD-10-CM | POA: Diagnosis not present

## 2017-08-07 DIAGNOSIS — F0280 Dementia in other diseases classified elsewhere without behavioral disturbance: Secondary | ICD-10-CM | POA: Diagnosis not present

## 2017-08-07 DIAGNOSIS — E039 Hypothyroidism, unspecified: Secondary | ICD-10-CM | POA: Diagnosis not present

## 2017-08-07 NOTE — Progress Notes (Signed)
Location:  [place] Room Number: 4 Place of Service:  SNF [phone]) Provider:  Blanchie Serve MD  Blanchie Serve, MD  Patient Care Team: Blanchie Serve, MD as PCP - General (Internal Medicine) Zadie Rhine Clent Demark, MD as Consulting Physician (Ophthalmology) Burnell Blanks, MD as Consulting Physician (Cardiology) Inda Castle, MD (Inactive) as Consulting Physician (Gastroenterology) Jari Pigg, MD as Consulting Physician (Dermatology) Dorna Leitz, MD as Consulting Physician (Orthopedic Surgery) Mast, Man X, NP as Nurse Practitioner (Internal Medicine)  Extended Emergency Contact Information Primary Emergency Contact: [facility] Address: [address]          [place], [place] [address] Johnnette Litter of [place] Phone: [phone] Relation: Daughter Secondary Emergency Contact: Davan, [place], [place] [address] Johnnette Litter of [place] Phone: [phone] Relation: Son  Code Status:  DNR  Goals of care: Advanced Directive information Advanced Directives 08/03/2017  Does Patient Have a Medical Advance Directive? Yes  Type of Advance Directive Out of facility DNR (pink MOST or yellow form)  Does patient want to make changes to medical advance directive? No - Patient declined  Copy of [place] in Chart? -  Pre-existing out of facility DNR order (yellow form or pink MOST form) Yellow form placed in chart (order not valid for inpatient use)     Chief Complaint  Patient presents with  . Medical Management of Chronic Issues    F/U- Hypothyroidism, Alzheimers, left hip pain,    HPI:  Pt is a 84 y.o. female seen today for medical management of chronic diseases. She is pleasantly confused. She went out for lunch with her daughter today. She denies any health concern this visit. No acute concern form nursing.   Diverticulosis- no abdominal pain, moves her bowel regularly, taking miralax and senokot s at  present  CHF- has diastolic chf, breathing stable, weight stable, on labetalool 100 mg bid with lisinopril 5 mg daily, also on aspirin  Hypothyroidism- takes levothyroxine 100 mcg daily at present  gerd- denies reflux or nausea. Appetite fair. Takes omeprazole 20 mg daily  Past Medical History:  Diagnosis Date  . Acute blood loss anemia 03/29/2014   03/30/14 Hgb 7.3 04/04/14 Hgb 8.5 continue Fe bid.  05/04/14 Hgb 10.8   . Benign neoplasm of colon   . Cancer ([place])    pre-melanoma  . Cystocele   . Dementia of the Alzheimer's type 05/22/2014   05/19/14 MMSE 18/30 Namenda.    . Diastolic dysfunction 0/27/7412  . Diverticulosis of colon (without mention of hemorrhage)   . Diverticulosis of large intestine 06/28/2004  . Esophageal reflux   . Essential hypertension 07/05/2013  . Gastroparesis   . Hemorrhoids   . HTN (hypertension)   . Hyperlipidemia 07/05/2013  . Hypertension   . Hypothyroidism   . Internal hemorrhoids without mention of complication 87/11/6765  . Macular degeneration   . Nonspecific abnormal electrocardiogram (ECG) (EKG)   . Osteoarthritis    right hip  . Other and unspecified hyperlipidemia   . Prediabetes 11/17/2013  . Primary osteoarthritis of right hip 03/27/2014  . Shingles   . Skin cancer (melanoma) ([place])   . Stricture and stenosis of esophagus   . Stricture and stenosis of esophagus 10/27/2007  . Vitamin D deficiency 11/17/2013   Past Surgical History:  Procedure Laterality Date  . APPENDECTOMY    . BCC resection     legs/face  . CATARACT EXTRACTION, BILATERAL    .  CHOLECYSTECTOMY    . FLEXIBLE SIGMOIDOSCOPY  05/12/2011   Procedure: FLEXIBLE SIGMOIDOSCOPY;  Surgeon: Inda Castle, MD;  [facility];  Service: Endoscopy;  Laterality: N/A;  . FLEXIBLE SIGMOIDOSCOPY  09/10/2011   Procedure: FLEXIBLE SIGMOIDOSCOPY;  Surgeon: Inda Castle, MD;  [facility];  Service: Endoscopy;  Laterality: N/A;  . FLEXIBLE SIGMOIDOSCOPY N/A 08/16/2012    Procedure: FLEXIBLE SIGMOIDOSCOPY;  Surgeon: Inda Castle, MD;  [facility];  Service: Endoscopy;  Laterality: N/A;  . FRACTURE SURGERY     left hip pin  . GALLBLADDER SURGERY  2002  . HEMORRHOID BANDING N/A 08/16/2012   Procedure: HEMORRHOID BANDING;  Surgeon: Inda Castle, MD;  [facility];  Service: Endoscopy;  Laterality: N/A;  . HEMORRHOID SURGERY    . left eye surgery    . melenoma resection     right arm  . THYROIDECTOMY    . TONSILLECTOMY    . TOTAL HIP ARTHROPLASTY Right 03/27/2014   Procedure: TOTAL HIP ARTHROPLASTY ANTERIOR APPROACH;  Surgeon: Alta Corning, MD;  Location: [facility];  Service: Orthopedics;  Laterality: Right;    Allergies  Allergen Reactions  . Ace Inhibitors Other (See Comments)    Elevated potassium  . Statins Other (See Comments)    Myalgias   . Vicodin [Hydrocodone-Acetaminophen] Other (See Comments)    "felt spaced out"  . Fosamax [Alendronate Sodium] Other (See Comments)  . Metoclopramide Hcl Other (See Comments)  . Sulfa Antibiotics Rash  . Sulfonamide Derivatives Rash  . Vesicare [Solifenacin] Other (See Comments)    Outpatient Encounter Medications as of 08/07/2017  Medication Sig  . acetaminophen (TYLENOL) 500 MG tablet Take 500 mg by mouth every 8 (eight) hours as needed for mild pain or moderate pain.   Marland Kitchen aspirin EC 81 MG tablet Take 81 mg by mouth daily.  . calcium carbonate (CAL-GEST ANTACID) 500 MG chewable tablet Chew 1 tablet by mouth daily as needed for indigestion or heartburn.  . carboxymethylcellulose (REFRESH PLUS) 0.5 % SOLN Place 1 drop into both eyes as needed.   . Cholecalciferol (VITAMIN D) 2000 UNITS tablet Take 4,000 Units by mouth daily.  Marland Kitchen guaiFENesin (TUSSIN) 100 MG/5ML liquid Take by mouth every 6 (six) hours as needed for cough (10 mL).  Marland Kitchen labetalol (NORMODYNE) 100 MG tablet Take 100 mg by mouth 2 (two) times daily.  Marland Kitchen levothyroxine (SYNTHROID, LEVOTHROID) 100 MCG tablet Take 100 mcg by mouth  daily before breakfast.  . lisinopril (PRINIVIL,ZESTRIL) 5 MG tablet Take 5 mg by mouth daily.  . memantine (NAMENDA XR) 28 MG CP24 24 hr capsule Take 28 mg by mouth daily.  . Multiple Vitamins-Minerals (PRESERVISION AREDS 2) CAPS Take 1 capsule by mouth 2 (two) times daily.  Marland Kitchen omeprazole (PRILOSEC) 20 MG capsule Take 20 mg by mouth daily.  . polyethylene glycol (MIRALAX / GLYCOLAX) packet Take 17 g by mouth every other day.   . protein supplement (RESOURCE BENEPROTEIN) POWD Take 1 scoop by mouth 2 (two) times daily.   Marland Kitchen pyrithione zinc (HEAD AND SHOULDERS) 1 % shampoo Apply 1 application topically 2 (two) times a week.   . sennosides-docusate sodium (SENOKOT-S) 8.6-50 MG tablet Take 2 tablets by mouth at bedtime.    No facility-administered encounter medications on file as of 08/07/2017.     Review of Systems  Unable to perform ROS: Dementia  Constitutional: Negative for appetite change, chills and fever.  HENT: Positive for hearing loss. Negative for congestion, mouth sores and rhinorrhea.  Respiratory: Negative for cough and shortness of breath.   Cardiovascular: Negative for chest pain and palpitations.  Gastrointestinal: Negative for abdominal pain, nausea and vomiting.  Genitourinary: Negative for dysuria and flank pain.  Musculoskeletal: Positive for gait problem. Negative for back pain.  Neurological: Negative for headaches.  Psychiatric/Behavioral: Positive for confusion. Negative for behavioral problems.    Immunization History  Administered Date(s) Administered  . Influenza, High Dose Seasonal PF 12/27/2013  . Influenza-Unspecified 01/12/2013, 01/02/2015, 01/29/2016, 02/03/2017  . Pneumococcal-Unspecified 07/01/2012  . Td 05/27/2004  . Tdap 02/21/2012  . Zoster 04/15/2007   Pertinent  Health Maintenance Due  Topic Date Due  . PNA vac Low Risk Adult (2 of 2 - PCV13) 07/01/2013  . INFLUENZA VACCINE  11/12/2017  . DEXA SCAN  Completed   Fall Risk  11/28/2016 05/22/2014  10/06/2013 06/02/2013  Falls in the past year? No No No No  Risk for fall due to : - - History of fall(s);Impaired balance/gait;Impaired mobility Impaired mobility   Functional Status Survey:    Vitals:   08/07/17 1521  BP: (!) 142/70  Pulse: 70  Resp: 18  Temp: 98.1 F (36.7 C)  SpO2: 96%  Weight: 139 lb 9.6 oz (63.3 kg)  Height: 5\' 1"  (1.549 m)   Body mass index is 26.38 kg/m.   Wt Readings from Last 3 Encounters:  08/07/17 139 lb 9.6 oz (63.3 kg)  08/03/17 139 lb 9.6 oz (63.3 kg)  07/07/17 140 lb 12.8 oz (63.9 kg)   Physical Exam  Constitutional: She appears well-developed and well-nourished. No distress.  HENT:  Head: Normocephalic and atraumatic.  Right Ear: External ear normal.  Left Ear: External ear normal.  Nose: Nose normal.  Mouth/Throat: Oropharynx is clear and moist. No oropharyngeal exudate.  Eyes: Pupils are equal, round, and reactive to light. Conjunctivae and EOM are normal. Right eye exhibits no discharge. Left eye exhibits no discharge.  Neck: Normal range of motion. Neck supple.  Cardiovascular: Regular rhythm and normal heart sounds.  Pulmonary/Chest: Effort normal and breath sounds normal.  Abdominal: Soft. Bowel sounds are normal. She exhibits no mass. There is no tenderness.  Musculoskeletal: She exhibits deformity. She exhibits no edema or tenderness.  Able to move all 4 extremities, unsteady gait, uses walker  Lymphadenopathy:    She has no cervical adenopathy.  Neurological: She is alert.  Oriented to self   Skin: Skin is warm and dry. She is not diaphoretic.  Psychiatric: She has a normal mood and affect.    Labs reviewed: Recent Labs    02/11/17 03/31/17 06/18/17  NA 142 141 142  K 3.4 4.1 3.2*  BUN 21 14 23*  CREATININE 0.8 1.01 0.7  CALCIUM  --  9.2  --    Recent Labs    02/11/17 03/31/17  AST 13 17  ALT 8 17  ALKPHOS 75 79  PROT  --  6.5  ALBUMIN  --  3.6   Recent Labs    09/16/16 02/11/17 03/31/17 08/04/17  WBC 8.3  8.9  --  9.4  NEUTROABS  --   --   --  6,439  HGB 12.5 12.0  --  12.4  HCT 37 36 44 37  PLT 248 255 133 250   Lab Results  Component Value Date   TSH 0.70 02/26/2017   Lab Results  Component Value Date   HGBA1C 5.8 (H) 07/06/2014   Lab Results  Component Value Date   CHOL 228 (A) 07/15/2016   HDL 55  07/15/2016   LDLCALC 146 07/15/2016   TRIG 141 07/15/2016   CHOLHDL 3.2 07/06/2014    Significant Diagnostic Results in last 30 days:  No results found.  Assessment/Plan  1. Chronic diastolic heart failure (HCC) Continue b blocker and ACEI and monitor  2. Gastroesophageal reflux disease without esophagitis Continue omeprazole and monitor  3. Hypothyroidism, unspecified type Continue levothyroxine, check TSH  4. Late onset Alzheimer's disease without behavioral disturbance Continue namenda xr and supportive care  5. Diverticulosis of large intestine without hemorrhage Symptom free. Continue bowel regimen to avoid constipation, hydration to be maintained  6.hypokalemia Noted on lab from 3/19. No recent bmp. Denies muscle pain or cramp. Looks hydrated. Check bmp  Family/ staff Communication: reviewed care plan with patient and charge nurse.    Labs/tests ordered:  TSH, BMP   Blanchie Serve, MD [facility] [address] [place], [place] [address] Cell Phone (Monday-Friday 8 am - 5 pm): [phone] On Call: [phone] and follow prompts after 5 pm and on weekends Office Phone: [phone] Office Fax: [phone]

## 2017-08-11 DIAGNOSIS — E039 Hypothyroidism, unspecified: Secondary | ICD-10-CM | POA: Diagnosis not present

## 2017-08-11 DIAGNOSIS — E876 Hypokalemia: Secondary | ICD-10-CM | POA: Diagnosis not present

## 2017-08-11 LAB — BASIC METABOLIC PANEL
BUN: 18 (ref 4–21)
Creatinine: 0.8 (ref <=1.1)
Glucose: 95
Potassium: 3.4 (ref 3.4–5.3)
Sodium: 141 (ref 137–147)

## 2017-08-11 LAB — TSH: TSH: 0.57 (ref <=5.90)

## 2017-08-12 ENCOUNTER — Other Ambulatory Visit: Payer: Self-pay | Admitting: *Deleted

## 2017-08-12 LAB — BASIC METABOLIC PANEL
CALCIUM: 8.6
CHLORIDE: 107
CO2: 25
GFR CALC NON AF AMER: 68

## 2017-09-03 ENCOUNTER — Encounter: Payer: Self-pay | Admitting: Nurse Practitioner

## 2017-09-03 NOTE — Progress Notes (Signed)
This encounter was created in error - please disregard.

## 2017-09-09 ENCOUNTER — Encounter: Payer: Self-pay | Admitting: Nurse Practitioner

## 2017-09-09 ENCOUNTER — Non-Acute Institutional Stay (SKILLED_NURSING_FACILITY): Payer: Medicare Other | Admitting: Nurse Practitioner

## 2017-09-09 DIAGNOSIS — F0280 Dementia in other diseases classified elsewhere without behavioral disturbance: Secondary | ICD-10-CM | POA: Diagnosis not present

## 2017-09-09 DIAGNOSIS — K5900 Constipation, unspecified: Secondary | ICD-10-CM

## 2017-09-09 DIAGNOSIS — K219 Gastro-esophageal reflux disease without esophagitis: Secondary | ICD-10-CM | POA: Diagnosis not present

## 2017-09-09 DIAGNOSIS — I10 Essential (primary) hypertension: Secondary | ICD-10-CM | POA: Diagnosis not present

## 2017-09-09 DIAGNOSIS — G301 Alzheimer's disease with late onset: Secondary | ICD-10-CM | POA: Diagnosis not present

## 2017-09-09 NOTE — Assessment & Plan Note (Signed)
constipation, stable, continue Senokot S II qhs, MiraLax qd.

## 2017-09-09 NOTE — Progress Notes (Signed)
Location:  [facility] Room Number: 8 Place of Service:  SNF (31) Provider: Shawniece Oyola, ManXie  NP  Blanchie Serve, MD  Patient Care Team: Blanchie Serve, MD as PCP - General (Internal Medicine) Zadie Rhine Clent Demark, MD as Consulting Physician (Ophthalmology) Burnell Blanks, MD as Consulting Physician (Cardiology) Inda Castle, MD (Inactive) as Consulting Physician (Gastroenterology) Jari Pigg, MD as Consulting Physician (Dermatology) Dorna Leitz, MD as Consulting Physician (Orthopedic Surgery) Megumi Treaster X, NP as Nurse Practitioner (Internal Medicine)  Extended Emergency Contact Information Primary Emergency Contact: [facility] Address: [address]          [place], [place] [address] Johnnette Litter of [place] Phone: [phone] Relation: Daughter Secondary Emergency Contact: [place], Mackley [place], [place] [address] Johnnette Litter of [place] Phone: [phone] Relation: Son  Code Status:  DNR Goals of care: Advanced Directive information Advanced Directives 09/09/2017  Does Patient Have a Medical Advance Directive? Yes  Type of Advance Directive Out of facility DNR (pink MOST or yellow form)  Does patient want to make changes to medical advance directive? No - Patient declined  Copy of [place] in Chart? -  Pre-existing out of facility DNR order (yellow form or pink MOST form) Yellow form placed in chart (order not valid for inpatient use)     Chief Complaint  Patient presents with  . Medical Management of Chronic Issues    F/U- Alzheimers, GERD,hypothyroidism, hypokalemia    HPI:  Pt is a 84 y.o. female seen today for medical management of chronic diseases.     The patient has Hx of constipation, stable on Senokot S II qhs, MiraLax qd. GERD stable on Omeprazole 20mg  qd. HTN, blood pressure is controlled on Lisinopril 5mg , Labetalol 100mg  bid. [facility] resides on [facility], self transfer and ambulates with walker on  unit. She is HOH, oriented to person and her room on unit.  Past Medical History:  Diagnosis Date  . Acute blood loss anemia 03/29/2014   03/30/14 Hgb 7.3 04/04/14 Hgb 8.5 continue Fe bid.  05/04/14 Hgb 10.8   . Benign neoplasm of colon   . Cancer ([place])    pre-melanoma  . Cystocele   . Dementia of the Alzheimer's type 05/22/2014   05/19/14 MMSE 18/30 Namenda.    . Diastolic dysfunction 1/75/1025  . Diverticulosis of colon (without mention of hemorrhage)   . Diverticulosis of large intestine 06/28/2004  . Esophageal reflux   . Essential hypertension 07/05/2013  . Gastroparesis   . Hemorrhoids   . HTN (hypertension)   . Hyperlipidemia 07/05/2013  . Hypertension   . Hypothyroidism   . Internal hemorrhoids without mention of complication 85/05/7780  . Macular degeneration   . Nonspecific abnormal electrocardiogram (ECG) (EKG)   . Osteoarthritis    right hip  . Other and unspecified hyperlipidemia   . Prediabetes 11/17/2013  . Primary osteoarthritis of right hip 03/27/2014  . Shingles   . Skin cancer (melanoma) ([place])   . Stricture and stenosis of esophagus   . Stricture and stenosis of esophagus 10/27/2007  . Vitamin D deficiency 11/17/2013   Past Surgical History:  Procedure Laterality Date  . APPENDECTOMY    . BCC resection     legs/face  . CATARACT EXTRACTION, BILATERAL    . CHOLECYSTECTOMY    . FLEXIBLE SIGMOIDOSCOPY  05/12/2011   Procedure: FLEXIBLE SIGMOIDOSCOPY;  Surgeon: Inda Castle, MD;  Location: [facility];  Service: Endoscopy;  Laterality: N/A;  . FLEXIBLE SIGMOIDOSCOPY  09/10/2011   Procedure: FLEXIBLE SIGMOIDOSCOPY;  Surgeon: Inda Castle, MD;  Location: [facility];  Service: Endoscopy;  Laterality: N/A;  . FLEXIBLE SIGMOIDOSCOPY N/A 08/16/2012   Procedure: FLEXIBLE SIGMOIDOSCOPY;  Surgeon: Inda Castle, MD;  Location: [facility];  Service: Endoscopy;  Laterality: N/A;  . FRACTURE SURGERY     left hip pin  . GALLBLADDER SURGERY  2002  . HEMORRHOID BANDING  N/A 08/16/2012   Procedure: HEMORRHOID BANDING;  Surgeon: Inda Castle, MD;  [facility];  Service: Endoscopy;  Laterality: N/A;  . HEMORRHOID SURGERY    . left eye surgery    . melenoma resection     right arm  . THYROIDECTOMY    . TONSILLECTOMY    . TOTAL HIP ARTHROPLASTY Right 03/27/2014   Procedure: TOTAL HIP ARTHROPLASTY ANTERIOR APPROACH;  Surgeon: Alta Corning, MD;  Location: [facility];  Service: Orthopedics;  Laterality: Right;    Allergies  Allergen Reactions  . Ace Inhibitors Other (See Comments)    Elevated potassium  . Statins Other (See Comments)    Myalgias   . Vicodin [Hydrocodone-Acetaminophen] Other (See Comments)    "felt spaced out"  . Fosamax [Alendronate Sodium] Other (See Comments)  . Metoclopramide Hcl Other (See Comments)  . Sulfa Antibiotics Rash  . Sulfonamide Derivatives Rash  . Vesicare [Solifenacin] Other (See Comments)    Outpatient Encounter Medications as of 09/09/2017  Medication Sig  . acetaminophen (TYLENOL) 500 MG tablet Take 500 mg by mouth every 8 (eight) hours as needed for mild pain or moderate pain.   Marland Kitchen aspirin EC 81 MG tablet Take 81 mg by mouth daily.  . calcium carbonate (CAL-GEST ANTACID) 500 MG chewable tablet Chew 1 tablet by mouth daily as needed for indigestion or heartburn.  . carboxymethylcellulose (REFRESH PLUS) 0.5 % SOLN Place 1 drop into both eyes as needed.   . Cholecalciferol (VITAMIN D) 2000 UNITS tablet Take 4,000 Units by mouth daily.  Marland Kitchen guaiFENesin (TUSSIN) 100 MG/5ML liquid Take by mouth every 6 (six) hours as needed for cough (10 mL).  Marland Kitchen labetalol (NORMODYNE) 100 MG tablet Take 100 mg by mouth 2 (two) times daily.  Marland Kitchen levothyroxine (SYNTHROID, LEVOTHROID) 100 MCG tablet Take 100 mcg by mouth daily before breakfast.  . lisinopril (PRINIVIL,ZESTRIL) 5 MG tablet Take 5 mg by mouth daily.  . memantine (NAMENDA XR) 28 MG CP24 24 hr capsule Take 28 mg by mouth daily.  . Multiple Vitamins-Minerals (PRESERVISION  AREDS 2) CAPS Take 1 capsule by mouth 2 (two) times daily.  . nitroGLYCERIN (NITROSTAT) 0.3 MG SL tablet Place 0.3 mg under the tongue every 5 (five) minutes as needed for chest pain.  Marland Kitchen omeprazole (PRILOSEC) 20 MG capsule Take 20 mg by mouth daily.  . polyethylene glycol (MIRALAX / GLYCOLAX) packet Take 17 g by mouth every other day.   . protein supplement (RESOURCE BENEPROTEIN) POWD Take 1 scoop by mouth 2 (two) times daily.   Marland Kitchen pyrithione zinc (HEAD AND SHOULDERS) 1 % shampoo Apply 1 application topically 2 (two) times a week.   . sennosides-docusate sodium (SENOKOT-S) 8.6-50 MG tablet Take 2 tablets by mouth at bedtime.    No facility-administered encounter medications on file as of 09/09/2017.    ROS was provided with assistance of staff Review of Systems  Constitutional: Negative for activity change, appetite change, chills, diaphoresis, fatigue and fever.  HENT: Positive for hearing loss. Negative for congestion, trouble swallowing and voice change.  Respiratory: Negative for cough, choking, shortness of breath and wheezing.   Cardiovascular: Negative for chest pain, palpitations and leg swelling.  Gastrointestinal: Negative for abdominal distention, abdominal pain, constipation, diarrhea, nausea and vomiting.  Genitourinary: Negative for difficulty urinating, dysuria and urgency.  Musculoskeletal: Positive for gait problem.  Skin: Negative for color change and pallor.  Neurological: Negative for dizziness, speech difficulty, weakness and headaches.       Dementia  Psychiatric/Behavioral: Positive for confusion. Negative for agitation, behavioral problems, hallucinations and sleep disturbance. The patient is not nervous/anxious.     Immunization History  Administered Date(s) Administered  . Influenza, High Dose Seasonal PF 12/27/2013  . Influenza-Unspecified 01/12/2013, 01/02/2015, 01/29/2016, 02/03/2017  . Pneumococcal-Unspecified 07/01/2012  . Td 05/27/2004  . Tdap 02/21/2012    . Zoster 04/15/2007   Pertinent  Health Maintenance Due  Topic Date Due  . PNA vac Low Risk Adult (2 of 2 - PCV13) 07/01/2013  . INFLUENZA VACCINE  11/12/2017  . DEXA SCAN  Completed   Fall Risk  11/28/2016 05/22/2014 10/06/2013 06/02/2013  Falls in the past year? No No No No  Risk for fall due to : - - History of fall(s);Impaired balance/gait;Impaired mobility Impaired mobility   Functional Status Survey:    Vitals:   09/09/17 1116  BP: (!) 148/72  Pulse: 72  Resp: 18  Temp: 97.9 F (36.6 C)  Weight: 137 lb 12.8 oz (62.5 kg)  Height: 5\' 1"  (1.549 m)   Body mass index is 26.04 kg/m. Physical Exam  Constitutional: She appears well-developed and well-nourished.  HENT:  Head: Normocephalic and atraumatic.  Eyes: Pupils are equal, round, and reactive to light. EOM are normal.  Neck: Normal range of motion. Neck supple. No JVD present. No thyromegaly present.  Cardiovascular: Normal rate and regular rhythm.  No murmur heard. Pulmonary/Chest: Effort normal. She has no wheezes. She has rales.  Posterior left base rales with deep breath  Abdominal: Soft. Bowel sounds are normal. She exhibits no distension.  Musculoskeletal: She exhibits no edema.  Self transfer, ambulates with walker  Neurological: She is alert. No cranial nerve deficit. She exhibits normal muscle tone. Coordination normal.  Oriented to person and her room on unit.   Skin: Skin is warm and dry.  Psychiatric: She has a normal mood and affect. Her behavior is normal.    Labs reviewed: Recent Labs    03/31/17 06/18/17 08/11/17  NA 141 142 141  K 4.1 3.2* 3.4  CL  --   --  107  CO2  --   --  25  BUN 14 23* 18  CREATININE 1.01 0.7 0.8  CALCIUM 9.2  --  8.6   Recent Labs    02/11/17 03/31/17  AST 13 17  ALT 8 17  ALKPHOS 75 79  PROT  --  6.5  ALBUMIN  --  3.6   Recent Labs    09/16/16 02/11/17 03/31/17 08/04/17  WBC 8.3 8.9  --  9.4  NEUTROABS  --   --   --  6,439  HGB 12.5 12.0  --  12.4  HCT 37  36 44 37  PLT 248 255 133 250   Lab Results  Component Value Date   TSH 0.57 08/11/2017   Lab Results  Component Value Date   HGBA1C 5.8 (H) 07/06/2014   Lab Results  Component Value Date   CHOL 228 (A) 07/15/2016   HDL 55 07/15/2016   LDLCALC 146 07/15/2016   TRIG 141 07/15/2016  CHOLHDL 3.2 07/06/2014    Significant Diagnostic Results in last 30 days:  No results found.  Assessment/Plan Constipation constipation, stable, continue Senokot S II qhs, MiraLax qd.   GERD GERD stable, continue  Omeprazole 20mg  qd.  Essential hypertension  HTN, blood pressure is controlled, continue Lisinopril 5mg , Labetalol 100mg  bid.   Dementia of the Alzheimer's type She resides on [facility], self transfer and ambulates with walker on unit. She is HOH, oriented to person and her room on unit. Continue Memantine for memory.       Family/ staff Communication: plan of care reviewed with the patient and charge nurse.   Labs/tests ordered:  None   Time spend 25 minutes.

## 2017-09-09 NOTE — Assessment & Plan Note (Signed)
HTN, blood pressure is controlled, continue Lisinopril 5mg , Labetalol 100mg  bid.

## 2017-09-09 NOTE — Assessment & Plan Note (Signed)
She resides on [facility], self transfer and ambulates with walker on unit. She is HOH, oriented to person and her room on unit. Continue Memantine for memory.

## 2017-09-09 NOTE — Assessment & Plan Note (Signed)
GERD stable, continue Omeprazole 20mg qd.  

## 2017-10-05 ENCOUNTER — Non-Acute Institutional Stay (SKILLED_NURSING_FACILITY): Payer: Medicare Other | Admitting: Nurse Practitioner

## 2017-10-05 ENCOUNTER — Encounter: Payer: Self-pay | Admitting: Nurse Practitioner

## 2017-10-05 DIAGNOSIS — K219 Gastro-esophageal reflux disease without esophagitis: Secondary | ICD-10-CM | POA: Diagnosis not present

## 2017-10-05 DIAGNOSIS — I10 Essential (primary) hypertension: Secondary | ICD-10-CM | POA: Diagnosis not present

## 2017-10-05 DIAGNOSIS — K5900 Constipation, unspecified: Secondary | ICD-10-CM | POA: Diagnosis not present

## 2017-10-05 DIAGNOSIS — G301 Alzheimer's disease with late onset: Secondary | ICD-10-CM

## 2017-10-05 DIAGNOSIS — F0280 Dementia in other diseases classified elsewhere without behavioral disturbance: Secondary | ICD-10-CM

## 2017-10-05 NOTE — Assessment & Plan Note (Signed)
Stable, continue Senokot S II qhs and MiraLax qod.

## 2017-10-05 NOTE — Assessment & Plan Note (Signed)
She ambulates with walker on unit, self transfer and toileting, she sleeps and eats at her baseline. Continue Mamentine 28mg  daily to preserve memory.

## 2017-10-05 NOTE — Assessment & Plan Note (Signed)
Blood pressure is controlled, continue Labetalol 100mg  bid, Lisinopril 5mg  daily. Observe.

## 2017-10-05 NOTE — Assessment & Plan Note (Signed)
Stable, continue Omeprazole 20mg daily.  

## 2017-10-05 NOTE — Progress Notes (Signed)
Location:  [facility] Room Number: 18 Place of Service:  SNF (31) Provider:  Mast, ManXie  NP  Blanchie Serve, MD  Patient Care Team: Blanchie Serve, MD as PCP - General (Internal Medicine) Zadie Rhine Clent Demark, MD as Consulting Physician (Ophthalmology) Burnell Blanks, MD as Consulting Physician (Cardiology) Inda Castle, MD (Inactive) as Consulting Physician (Gastroenterology) Jari Pigg, MD as Consulting Physician (Dermatology) Dorna Leitz, MD as Consulting Physician (Orthopedic Surgery) Mast, Man X, NP as Nurse Practitioner (Internal Medicine)  Extended Emergency Contact Information Primary Emergency Contact: [facility] Address: [address]          [place], [place] [address] Johnnette Litter of [place] Phone: [phone] Relation: Daughter Secondary Emergency Contact: [place], [place],  [address] Johnnette Litter of [place] Phone: [phone] Relation: Son  Code Status:  DNR Goals of care: Advanced Directive information Advanced Directives 10/05/2017  Does Patient Have a Medical Advance Directive? Yes  Type of Advance Directive Out of facility DNR (pink MOST or yellow form)  Does patient want to make changes to medical advance directive? No - Patient declined  Copy of [place] in Chart? -  Pre-existing out of facility DNR order (yellow form or pink MOST form) Yellow form placed in chart (order not valid for inpatient use)     Chief Complaint  Patient presents with  . Medical Management of Chronic Issues    F/U- HTN, GERD, constipation, Alzheimers    HPI:  Pt is a 84 y.o. female seen today for medical management of chronic diseases.     The patient has history of dementia, resides in [facility], self transfer, ambulates with walker, taking Memantine 28mg  daily for memory. No constipation on Senokot S II qhs and MiraLax qod. GERD stable on Omeprazole 20mg  qd Hx of HTN, blood pressure is controlled  on Lisinopril 5mg  daily, Labetalol 100mg  bid.  Past Medical History:  Diagnosis Date  . Acute blood loss anemia 03/29/2014   03/30/14 Hgb 7.3 04/04/14 Hgb 8.5 continue Fe bid.  05/04/14 Hgb 10.8   . Benign neoplasm of colon   . Cancer ([place])    pre-melanoma  . Cystocele   . Dementia of the Alzheimer's type 05/22/2014   05/19/14 MMSE 18/30 Namenda.    . Diastolic dysfunction 4/58/0998  . Diverticulosis of colon (without mention of hemorrhage)   . Diverticulosis of large intestine 06/28/2004  . Esophageal reflux   . Essential hypertension 07/05/2013  . Gastroparesis   . Hemorrhoids   . HTN (hypertension)   . Hyperlipidemia 07/05/2013  . Hypertension   . Hypothyroidism   . Internal hemorrhoids without mention of complication 33/11/2503  . Macular degeneration   . Nonspecific abnormal electrocardiogram (ECG) (EKG)   . Osteoarthritis    right hip  . Other and unspecified hyperlipidemia   . Prediabetes 11/17/2013  . Primary osteoarthritis of right hip 03/27/2014  . Shingles   . Skin cancer (melanoma) ([place])   . Stricture and stenosis of esophagus   . Stricture and stenosis of esophagus 10/27/2007  . Vitamin D deficiency 11/17/2013   Past Surgical History:  Procedure Laterality Date  . APPENDECTOMY    . BCC resection     legs/face  . CATARACT EXTRACTION, BILATERAL    . CHOLECYSTECTOMY    . FLEXIBLE SIGMOIDOSCOPY  05/12/2011   Procedure: FLEXIBLE SIGMOIDOSCOPY;  Surgeon: Inda Castle, MD;  [facility];  Service: Endoscopy;  Laterality: N/A;  .  FLEXIBLE SIGMOIDOSCOPY  09/10/2011   Procedure: FLEXIBLE SIGMOIDOSCOPY;  Surgeon: Inda Castle, MD;  [facility];  Service: Endoscopy;  Laterality: N/A;  . FLEXIBLE SIGMOIDOSCOPY N/A 08/16/2012   Procedure: FLEXIBLE SIGMOIDOSCOPY;  Surgeon: Inda Castle, MD;  [facility];  Service: Endoscopy;  Laterality: N/A;  . FRACTURE SURGERY     left hip pin  . GALLBLADDER SURGERY  2002  . HEMORRHOID BANDING N/A 08/16/2012    Procedure: HEMORRHOID BANDING;  Surgeon: Inda Castle, MD;  [facility];  Service: Endoscopy;  Laterality: N/A;  . HEMORRHOID SURGERY    . left eye surgery    . melenoma resection     right arm  . THYROIDECTOMY    . TONSILLECTOMY    . TOTAL HIP ARTHROPLASTY Right 03/27/2014   Procedure: TOTAL HIP ARTHROPLASTY ANTERIOR APPROACH;  Surgeon: Alta Corning, MD;  Location: [facility];  Service: Orthopedics;  Laterality: Right;    Allergies  Allergen Reactions  . Ace Inhibitors Other (See Comments)    Elevated potassium  . Statins Other (See Comments)    Myalgias   . Vicodin [Hydrocodone-Acetaminophen] Other (See Comments)    "felt spaced out"  . Fosamax [Alendronate Sodium] Other (See Comments)  . Metoclopramide Hcl Other (See Comments)  . Sulfa Antibiotics Rash  . Sulfonamide Derivatives Rash  . Vesicare [Solifenacin] Other (See Comments)    Outpatient Encounter Medications as of 10/05/2017  Medication Sig  . acetaminophen (TYLENOL) 500 MG tablet Take 500 mg by mouth every 8 (eight) hours as needed for mild pain or moderate pain.   Marland Kitchen aspirin EC 81 MG tablet Take 81 mg by mouth daily.  . calcium carbonate (CAL-GEST ANTACID) 500 MG chewable tablet Chew 1 tablet by mouth daily as needed for indigestion or heartburn.  . carboxymethylcellulose (REFRESH PLUS) 0.5 % SOLN Place 1 drop into both eyes as needed.   . Cholecalciferol (VITAMIN D) 2000 UNITS tablet Take 4,000 Units by mouth daily.  Marland Kitchen guaiFENesin (TUSSIN) 100 MG/5ML liquid Take by mouth every 6 (six) hours as needed for cough (10 mL).  Marland Kitchen labetalol (NORMODYNE) 100 MG tablet Take 100 mg by mouth 2 (two) times daily.  Marland Kitchen levothyroxine (SYNTHROID, LEVOTHROID) 100 MCG tablet Take 100 mcg by mouth daily before breakfast.  . lisinopril (PRINIVIL,ZESTRIL) 5 MG tablet Take 5 mg by mouth daily.  . memantine (NAMENDA XR) 28 MG CP24 24 hr capsule Take 28 mg by mouth daily.  . Multiple Vitamins-Minerals (PRESERVISION AREDS 2) CAPS Take  1 capsule by mouth 2 (two) times daily.  . nitroGLYCERIN (NITROSTAT) 0.3 MG SL tablet Place 0.3 mg under the tongue every 5 (five) minutes as needed for chest pain.  Marland Kitchen omeprazole (PRILOSEC) 20 MG capsule Take 20 mg by mouth daily.  . polyethylene glycol (MIRALAX / GLYCOLAX) packet Take 17 g by mouth every other day.   . protein supplement (RESOURCE BENEPROTEIN) POWD Take 1 scoop by mouth 2 (two) times daily.   Marland Kitchen pyrithione zinc (HEAD AND SHOULDERS) 1 % shampoo Apply 1 application topically 2 (two) times a week.   . sennosides-docusate sodium (SENOKOT-S) 8.6-50 MG tablet Take 2 tablets by mouth at bedtime.    No facility-administered encounter medications on file as of 10/05/2017.   ROS was provided with assistance of staff Review of Systems  Constitutional: Negative for activity change, appetite change, chills, diaphoresis, fatigue and fever.  HENT: Positive for hearing loss. Negative for congestion, trouble swallowing and voice change.   Respiratory: Negative for  cough and shortness of breath.   Cardiovascular: Negative for chest pain, palpitations and leg swelling.  Gastrointestinal: Negative for abdominal distention, abdominal pain, constipation, diarrhea, nausea and vomiting.  Genitourinary: Negative for difficulty urinating, dysuria and urgency.  Musculoskeletal: Positive for gait problem.  Skin: Negative for color change and pallor.  Neurological: Negative for dizziness, speech difficulty, weakness and headaches.       Dementia  Psychiatric/Behavioral: Positive for confusion. Negative for agitation, behavioral problems and sleep disturbance. The patient is not nervous/anxious.     Immunization History  Administered Date(s) Administered  . Influenza, High Dose Seasonal PF 12/27/2013  . Influenza-Unspecified 01/12/2013, 01/02/2015, 01/29/2016, 02/03/2017  . Pneumococcal-Unspecified 07/01/2012  . Td 05/27/2004  . Tdap 02/21/2012  . Zoster 04/15/2007   Pertinent  Health Maintenance  Due  Topic Date Due  . PNA vac Low Risk Adult (2 of 2 - PCV13) 07/01/2013  . INFLUENZA VACCINE  11/12/2017  . DEXA SCAN  Completed   Fall Risk  11/28/2016 05/22/2014 10/06/2013 06/02/2013  Falls in the past year? No No No No  Risk for fall due to : - - History of fall(s);Impaired balance/gait;Impaired mobility Impaired mobility   Functional Status Survey:    Vitals:   10/05/17 1226  BP: 140/82  Pulse: 72  Resp: 20  Temp: 97.7 F (36.5 C)  Weight: 136 lb 11.2 oz (62 kg)  Height: 5\' 1"  (1.549 m)   Body mass index is 25.83 kg/m. Physical Exam  Constitutional: She appears well-developed and well-nourished.  HENT:  Head: Normocephalic and atraumatic.  Eyes: Pupils are equal, round, and reactive to light. EOM are normal.  Neck: Normal range of motion. Neck supple. No JVD present. No thyromegaly present.  Cardiovascular: Normal rate and regular rhythm.  No murmur heard. Pulmonary/Chest: Effort normal. She has no wheezes. She has rales.  Posterior left basilar rales  Abdominal: Soft. She exhibits no distension. There is no tenderness.  Musculoskeletal: She exhibits no edema or tenderness.  Self transfer, ambulates with walker.   Neurological: She is alert. No cranial nerve deficit. She exhibits normal muscle tone. Coordination normal.  Oriented to person and her room on unit.   Skin: Skin is warm and dry.  Psychiatric: She has a normal mood and affect. Her behavior is normal.    Labs reviewed: Recent Labs    03/31/17 06/18/17 08/11/17  NA 141 142 141  K 4.1 3.2* 3.4  CL  --   --  107  CO2  --   --  25  BUN 14 23* 18  CREATININE 1.01 0.7 0.8  CALCIUM 9.2  --  8.6   Recent Labs    02/11/17 03/31/17  AST 13 17  ALT 8 17  ALKPHOS 75 79  PROT  --  6.5  ALBUMIN  --  3.6   Recent Labs    02/11/17 03/31/17 08/04/17  WBC 8.9  --  9.4  NEUTROABS  --   --  6,439  HGB 12.0  --  12.4  HCT 36 44 37  PLT 255 133 250   Lab Results  Component Value Date   TSH 0.57  08/11/2017   Lab Results  Component Value Date   HGBA1C 5.8 (H) 07/06/2014   Lab Results  Component Value Date   CHOL 228 (A) 07/15/2016   HDL 55 07/15/2016   LDLCALC 146 07/15/2016   TRIG 141 07/15/2016   CHOLHDL 3.2 07/06/2014    Significant Diagnostic Results in last 30 days:  No results  found.  Assessment/Plan Essential hypertension Blood pressure is controlled, continue Labetalol 100mg  bid, Lisinopril 5mg  daily. Observe.   GERD Stable, continue Omeprazole 20mg  daily.   Dementia of the Alzheimer's type She ambulates with walker on unit, self transfer and toileting, she sleeps and eats at her baseline. Continue Mamentine 28mg  daily to preserve memory.   Constipation Stable, continue Senokot S II qhs and MiraLax qod.      Family/ staff Communication: plan of care reviewed with the patient and charge nurse   Labs/tests ordered:  none  Time spend 25 minutes.

## 2017-10-06 ENCOUNTER — Encounter: Payer: Self-pay | Admitting: Nurse Practitioner

## 2017-11-09 ENCOUNTER — Encounter: Payer: Self-pay | Admitting: Internal Medicine

## 2017-11-09 NOTE — Progress Notes (Signed)
Opened in error

## 2017-11-09 NOTE — Patient Instructions (Signed)
Opened in error

## 2017-11-11 ENCOUNTER — Encounter: Payer: Self-pay | Admitting: Internal Medicine

## 2017-11-11 ENCOUNTER — Non-Acute Institutional Stay (SKILLED_NURSING_FACILITY): Payer: Medicare Other | Admitting: Internal Medicine

## 2017-11-11 DIAGNOSIS — K5900 Constipation, unspecified: Secondary | ICD-10-CM

## 2017-11-11 DIAGNOSIS — G301 Alzheimer's disease with late onset: Secondary | ICD-10-CM

## 2017-11-11 DIAGNOSIS — K219 Gastro-esophageal reflux disease without esophagitis: Secondary | ICD-10-CM

## 2017-11-11 DIAGNOSIS — I10 Essential (primary) hypertension: Secondary | ICD-10-CM

## 2017-11-11 DIAGNOSIS — F0280 Dementia in other diseases classified elsewhere without behavioral disturbance: Secondary | ICD-10-CM

## 2017-11-11 DIAGNOSIS — E039 Hypothyroidism, unspecified: Secondary | ICD-10-CM | POA: Diagnosis not present

## 2017-11-11 NOTE — Progress Notes (Signed)
Location:  [facility] Room Number: 82 Place of Service:  SNF [phone]) Provider:  Blanchie Serve MD  Blanchie Serve, MD  Patient Care Team: Blanchie Serve, MD as PCP - General (Internal Medicine) Zadie Rhine Clent Demark, MD as Consulting Physician (Ophthalmology) Burnell Blanks, MD as Consulting Physician (Cardiology) Inda Castle, MD (Inactive) as Consulting Physician (Gastroenterology) Jari Pigg, MD as Consulting Physician (Dermatology) Dorna Leitz, MD as Consulting Physician (Orthopedic Surgery) Mast, Man X, NP as Nurse Practitioner (Internal Medicine)  Extended Emergency Contact Information Primary Emergency Contact: [facility] Address: [address]          [place], [place] [address] Johnnette Litter of [place] Phone: [phone] Relation: Daughter Secondary Emergency Contact: Mar[place]a, [place], [place] [address] Johnnette Litter of [place] Phone: [phone] Relation: Son  Code Status:  DNR  Goals of care: Advanced Directive information Advanced Directives 11/11/2017  Does Patient Have a Medical Advance Directive? Yes  Type of Advance Directive Out of facility DNR (pink MOST or yellow form)  Does patient want to make changes to medical advance directive? No - Patient declined  Copy of [place] in Chart? -  Pre-existing out of facility DNR order (yellow form or pink MOST form) Yellow form placed in chart (order not valid for inpatient use)     Chief Complaint  Patient presents with  . Medical Management of Chronic Issues    Routine Visit     HPI:  Pt is a 84 y.o. female seen today for medical management of chronic diseases. She is pleasantly confused. She is sitting on her chair. She is in no distress.    Essential hypertension- takes lisinopril 5 mg daily and labetalol 100 mg bid. Stable BP overall on review. Few readings with SBP in 150s. Denies any symptom.  Dementia- takes memnatine 28 mg daily,  ambulates with walker, no acute behavior changes reported, no fall reported, needs assistance with ADLs like toileting, dressing and bathing.   gerd- takes omeprazole 20 mg daily, denies reflux  Hypothyroidism- takes levothyroxine 100 mcg daily, toleraing well  Chronic constipation- on senokot s and miralax every other day   Past Medical History:  Diagnosis Date  . Acute blood loss anemia 03/29/2014   03/30/14 Hgb 7.3 04/04/14 Hgb 8.5 continue Fe bid.  05/04/14 Hgb 10.8   . Benign neoplasm of colon   . Cancer ([place])    pre-melanoma  . Cystocele   . Dementia of the Alzheimer's type 05/22/2014   05/19/14 MMSE 18/30 Namenda.    . Diastolic dysfunction 7/40/8144  . Diverticulosis of colon (without mention of hemorrhage)   . Diverticulosis of large intestine 06/28/2004  . Esophageal reflux   . Essential hypertension 07/05/2013  . Gastroparesis   . Hemorrhoids   . HTN (hypertension)   . Hyperlipidemia 07/05/2013  . Hypertension   . Hypothyroidism   . Internal hemorrhoids without mention of complication 81/11/5629  . Macular degeneration   . Nonspecific abnormal electrocardiogram (ECG) (EKG)   . Osteoarthritis    right hip  . Other and unspecified hyperlipidemia   . Prediabetes 11/17/2013  . Primary osteoarthritis of right hip 03/27/2014  . Shingles   . Skin cancer (melanoma) ([facility])   . Stricture and stenosis of esophagus   . Stricture and stenosis of esophagus 10/27/2007  . Vitamin D deficiency 11/17/2013   Past Surgical History:  Procedure Laterality Date  . APPENDECTOMY    . [place]  resection     legs/face  . CATARACT EXTRACTION, BILATERAL    . CHOLECYSTECTOMY    . FLEXIBLE SIGMOIDOSCOPY  05/12/2011   Procedure: FLEXIBLE SIGMOIDOSCOPY;  Surgeon: Inda Castle, MD;  Location: [facility];  Service: Endoscopy;  Laterality: N/A;  . FLEXIBLE SIGMOIDOSCOPY  09/10/2011   Procedure: FLEXIBLE SIGMOIDOSCOPY;  Surgeon: Inda Castle, MD;  Location: [facility];  Service: Endoscopy;   Laterality: N/A;  . FLEXIBLE SIGMOIDOSCOPY N/A 08/16/2012   Procedure: FLEXIBLE SIGMOIDOSCOPY;  Surgeon: Inda Castle, MD;  Location: [facility];  Service: Endoscopy;  Laterality: N/A;  . FRACTURE SURGERY     left hip pin  . GALLBLADDER SURGERY  2002  . HEMORRHOID BANDING N/A 08/16/2012   Procedure: HEMORRHOID BANDING;  Surgeon: Inda Castle, MD;  Location: [facility];  Service: Endoscopy;  Laterality: N/A;  . HEMORRHOID SURGERY    . left eye surgery    . melenoma resection     right arm  . THYROIDECTOMY    . TONSILLECTOMY    . TOTAL HIP ARTHROPLASTY Right 03/27/2014   Procedure: TOTAL HIP ARTHROPLASTY ANTERIOR APPROACH;  Surgeon: Alta Corning, MD;  Location: [facility];  Service: Orthopedics;  Laterality: Right;    Allergies  Allergen Reactions  . Ace Inhibitors Other (See Comments)    Elevated potassium  . Statins Other (See Comments)    Myalgias   . Vicodin [Hydrocodone-Acetaminophen] Other (See Comments)    "felt spaced out"  . Fosamax [Alendronate Sodium] Other (See Comments)  . Metoclopramide Hcl Other (See Comments)  . Sulfa Antibiotics Rash  . Sulfonamide Derivatives Rash  . Vesicare [Solifenacin] Other (See Comments)    Outpatient Encounter Medications as of 11/11/2017  Medication Sig  . acetaminophen (TYLENOL) 500 MG tablet Take 500 mg by mouth every 8 (eight) hours as needed for mild pain or moderate pain.   Marland Kitchen aspirin EC 81 MG tablet Take 81 mg by mouth daily.  . calcium carbonate (CAL-GEST ANTACID) 500 MG chewable tablet Chew 1 tablet by mouth daily as needed for indigestion or heartburn.  . carboxymethylcellulose (REFRESH PLUS) 0.5 % SOLN Place 1 drop into both eyes as needed.   . Cholecalciferol (VITAMIN D) 2000 UNITS tablet Take 4,000 Units by mouth daily.  Marland Kitchen guaiFENesin (TUSSIN) 100 MG/5ML liquid Take by mouth every 6 (six) hours as needed for cough (10 mL).  Marland Kitchen labetalol (NORMODYNE) 100 MG tablet Take 100 mg by mouth 2 (two) times daily.  Marland Kitchen levothyroxine  (SYNTHROID, LEVOTHROID) 100 MCG tablet Take 100 mcg by mouth daily before breakfast.  . lisinopril (PRINIVIL,ZESTRIL) 5 MG tablet Take 5 mg by mouth daily.  . memantine (NAMENDA XR) 28 MG CP24 24 hr capsule Take 28 mg by mouth daily.  . Multiple Vitamins-Minerals (PRESERVISION AREDS 2) CAPS Take 1 capsule by mouth 2 (two) times daily.  . nitroGLYCERIN (NITROSTAT) 0.3 MG SL tablet Place 0.3 mg under the tongue every 5 (five) minutes as needed for chest pain.  Marland Kitchen omeprazole (PRILOSEC) 20 MG capsule Take 20 mg by mouth daily.  . polyethylene glycol (MIRALAX / GLYCOLAX) packet Take 17 g by mouth every other day.   . protein supplement (RESOURCE BENEPROTEIN) POWD Take 1 scoop by mouth 2 (two) times daily.   Marland Kitchen pyrithione zinc (HEAD AND SHOULDERS) 1 % shampoo Apply 1 application topically 2 (two) times a week.   . sennosides-docusate sodium (SENOKOT-S) 8.6-50 MG tablet Take 2 tablets by mouth at bedtime.    No facility-administered encounter medications on file  as of 11/11/2017.     Review of Systems  Constitutional: Negative for appetite change, chills and fever.  HENT: Positive for hearing loss. Negative for congestion, mouth sores, rhinorrhea, sore throat and trouble swallowing.   Respiratory: Negative for cough and shortness of breath.   Cardiovascular: Negative for chest pain and palpitations.  Gastrointestinal: Negative for abdominal pain, constipation, diarrhea, nausea and vomiting.  Genitourinary: Negative for dysuria and hematuria.  Musculoskeletal: Positive for gait problem. Negative for back pain.  Skin: Negative for rash.  Neurological: Negative for dizziness and headaches.  Psychiatric/Behavioral: Positive for behavioral problems and confusion.    Immunization History  Administered Date(s) Administered  . Influenza, High Dose Seasonal PF 12/27/2013  . Influenza-Unspecified 01/12/2013, 01/02/2015, 01/29/2016, 02/03/2017  . Pneumococcal-Unspecified 07/01/2012  . Td 05/27/2004  .  Tdap 02/21/2012  . Zoster 04/15/2007   Pertinent  Health Maintenance Due  Topic Date Due  . PNA vac Low Risk Adult (2 of 2 - PCV13) 07/01/2013  . INFLUENZA VACCINE  11/12/2017  . DEXA SCAN  Completed   Fall Risk  11/28/2016 05/22/2014 10/06/2013 06/02/2013  Falls in the past year? No No No No  Risk for fall due to : - - History of fall(s);Impaired balance/gait;Impaired mobility Impaired mobility   Functional Status Survey:    Vitals:   11/11/17 1207  BP: 130/74  Pulse: 70  Resp: 18  Temp: 98.6 F (37 C)  TempSrc: Oral  SpO2: 96%  Weight: 133 lb 9.6 oz (60.6 kg)  Height: 5\' 1"  (1.549 m)   Body mass index is 25.24 kg/m.   Wt Readings from Last 3 Encounters:  11/11/17 133 lb 9.6 oz (60.6 kg)  10/05/17 136 lb 11.2 oz (62 kg)  09/09/17 137 lb 12.8 oz (62.5 kg)   Physical Exam  Constitutional: She appears well-developed and well-nourished. No distress.  HENT:  Head: Normocephalic and atraumatic.  Right Ear: External ear normal.  Left Ear: External ear normal.  Nose: Nose normal.  Mouth/Throat: Oropharynx is clear and moist.  Hearing aid present  Eyes: Pupils are equal, round, and reactive to light. Conjunctivae are normal. Right eye exhibits no discharge. Left eye exhibits no discharge.  Has corrective glasses  Neck: Normal range of motion. Neck supple.  Cardiovascular: Normal rate and regular rhythm.  Pulmonary/Chest: Effort normal and breath sounds normal. No respiratory distress. She has no wheezes. She has no rales.  Abdominal: Soft. Bowel sounds are normal. There is no tenderness. There is no guarding.  Musculoskeletal: She exhibits no edema or tenderness.  Can move all 4 extremities, unsteady gait, uses walker for ambulation  Lymphadenopathy:    She has no cervical adenopathy.  Neurological: She is alert.  Oriented to self  Skin: Skin is warm and dry. She is not diaphoretic.  Psychiatric: She has a normal mood and affect. Her behavior is normal.    Labs  reviewed: Recent Labs    03/31/17 06/18/17 08/11/17  NA 141 142 141  K 4.1 3.2* 3.4  CL  --   --  107  CO2  --   --  25  BUN 14 23* 18  CREATININE 1.01 0.7 0.8  CALCIUM 9.2  --  8.6   Recent Labs    02/11/17 03/31/17  AST 13 17  ALT 8 17  ALKPHOS 75 79  PROT  --  6.5  ALBUMIN  --  3.6   Recent Labs    02/11/17 03/31/17 08/04/17  WBC 8.9  --  9.4  NEUTROABS  --   --  6,439  HGB 12.0  --  12.4  HCT 36 44 37  PLT 255 133 250   Lab Results  Component Value Date   TSH 0.57 08/11/2017   Lab Results  Component Value Date   HGBA1C 5.8 (H) 07/06/2014   Lab Results  Component Value Date   CHOL 228 (A) 07/15/2016   HDL 55 07/15/2016   LDLCALC 146 07/15/2016   TRIG 141 07/15/2016   CHOLHDL 3.2 07/06/2014    Significant Diagnostic Results in last 30 days:  No results found.  Assessment/Plan  1. Essential hypertension Controlled BP reading, BMP uptodate, continue labetalolol and lisinopril current regimen  2. Gastroesophageal reflux disease without esophagitis Continue omeprazole  3. Late onset Alzheimer's disease without behavioral disturbance Supportive care, continue namenda  4. Hypothyroidism, unspecified type Lab Results  Component Value Date   TSH 0.57 08/11/2017   Continue levothyroxine, check TSH  5. Constipation, unspecified constipation type continue current bowel regimen, maintain hydration   Family/ staff Communication: reviewed care plan with patient and charge nurse.    Labs/tests ordered:  TSH   Blanchie Serve, MD Internal Medicine [facility] [address] [place], [place] [address] Cell Phone (Monday-Friday 8 am - 5 pm): [phone] On Call: [phone] and follow prompts after 5 pm and on weekends Office Phone: [phone] Office Fax: [phone]

## 2017-11-12 DIAGNOSIS — H35052 Retinal neovascularization, unspecified, left eye: Secondary | ICD-10-CM | POA: Diagnosis not present

## 2017-11-12 DIAGNOSIS — H353222 Exudative age-related macular degeneration, left eye, with inactive choroidal neovascularization: Secondary | ICD-10-CM | POA: Diagnosis not present

## 2017-11-12 DIAGNOSIS — H35352 Cystoid macular degeneration, left eye: Secondary | ICD-10-CM | POA: Diagnosis not present

## 2017-11-12 DIAGNOSIS — H353212 Exudative age-related macular degeneration, right eye, with inactive choroidal neovascularization: Secondary | ICD-10-CM | POA: Diagnosis not present

## 2017-11-19 DIAGNOSIS — E039 Hypothyroidism, unspecified: Secondary | ICD-10-CM | POA: Diagnosis not present

## 2017-11-19 LAB — TSH: TSH: 0.98 (ref <=5.90)

## 2017-11-20 ENCOUNTER — Other Ambulatory Visit: Payer: Self-pay | Admitting: *Deleted

## 2017-11-25 ENCOUNTER — Encounter: Payer: Self-pay | Admitting: Internal Medicine

## 2017-12-01 ENCOUNTER — Non-Acute Institutional Stay (SKILLED_NURSING_FACILITY): Payer: Medicare Other

## 2017-12-01 DIAGNOSIS — Z0000 Encounter for general adult medical examination without abnormal findings: Secondary | ICD-10-CM | POA: Diagnosis not present

## 2017-12-01 NOTE — Patient Instructions (Signed)
[name] , Thank you for taking time to come for your Medicare Wellness Visit. I appreciate your ongoing commitment to your health goals. Please review the following plan we discussed and let me know if I can assist you in the future.   Screening recommendations/referrals: Colonoscopy excluded, over age 82 Mammogram excluded, over age 15 Bone Density up to date Recommended yearly ophthalmology/optometry visit for glaucoma screening and checkup Recommended yearly dental visit for hygiene and checkup  Vaccinations: Influenza vaccine due Pneumococcal vaccine 13 due, ordered Tdap vaccine up to date Shingles vaccine not past records    Advanced directives: in chart  Conditions/risks identified: none  Next appointment: Dr. Bubba Camp makes rounds   Preventive Care 61 Years and Older, Female Preventive care refers to lifestyle choices and visits with your health care provider that can promote health and wellness. What does preventive care include?  A yearly physical exam. This is also called an annual well check.  Dental exams once or twice a year.  Routine eye exams. Ask your health care provider how often you should have your eyes checked.  Personal lifestyle choices, including:  Daily care of your teeth and gums.  Regular physical activity.  Eating a healthy diet.  Avoiding tobacco and drug use.  Limiting alcohol use.  Practicing safe sex.  Taking low-dose aspirin every day.  Taking vitamin and mineral supplements as recommended by your health care provider. What happens during an annual well check? The services and screenings done by your health care provider during your annual well check will depend on your age, overall health, lifestyle risk factors, and family history of disease. Counseling  Your health care provider may ask you questions about your:  Alcohol use.  Tobacco use.  Drug use.  Emotional well-being.  Home and relationship  well-being.  Sexual activity.  Eating habits.  History of falls.  Memory and ability to understand (cognition).  Work and work Statistician.  Reproductive health. Screening  You may have the following tests or measurements:  Height, weight, and BMI.  Blood pressure.  Lipid and cholesterol levels. These may be checked every 5 years, or more frequently if you are over 22 years old.  Skin check.  Lung cancer screening. You may have this screening every year starting at age 39 if you have a 30-pack-year history of smoking and currently smoke or have quit within the past 15 years.  Fecal occult blood test (FOBT) of the stool. You may have this test every year starting at age 72.  Flexible sigmoidoscopy or colonoscopy. You may have a sigmoidoscopy every 5 years or a colonoscopy every 10 years starting at age 66.  Hepatitis C blood test.  Hepatitis B blood test.  Sexually transmitted disease (STD) testing.  Diabetes screening. This is done by checking your blood sugar (glucose) after you have not eaten for a while (fasting). You may have this done every 1-3 years.  Bone density scan. This is done to screen for osteoporosis. You may have this done starting at age 58.  Mammogram. This may be done every 1-2 years. Talk to your health care provider about how often you should have regular mammograms. Talk with your health care provider about your test results, treatment options, and if necessary, the need for more tests. Vaccines  Your health care provider may recommend certain vaccines, such as:  Influenza vaccine. This is recommended every year.  Tetanus, diphtheria, and acellular pertussis (Tdap, Td) vaccine. You may need a Td booster every 10  years.  Zoster vaccine. You may need this after age 92.  Pneumococcal 13-valent conjugate (PCV13) vaccine. One dose is recommended after age 61.  Pneumococcal polysaccharide (PPSV23) vaccine. One dose is recommended after age  72. Talk to your health care provider about which screenings and vaccines you need and how often you need them. This information is not intended to replace advice given to you by your health care provider. Make sure you discuss any questions you have with your health care provider. Document Released: 04/27/2015 Document Revised: 12/19/2015 Document Reviewed: 01/30/2015 Elsevier Interactive Patient Education  2017 [place] Prevention in the Home Falls can cause injuries. They can happen to people of all ages. There are many things you can do to make your home safe and to help prevent falls. What can I do on the outside of my home?  Regularly fix the edges of walkways and driveways and fix any cracks.  Remove anything that might make you trip as you walk through a door, such as a raised step or threshold.  Trim any bushes or trees on the path to your home.  Use bright outdoor lighting.  Clear any walking paths of anything that might make someone trip, such as rocks or tools.  Regularly check to see if handrails are loose or broken. Make sure that both sides of any steps have handrails.  Any raised decks and porches should have guardrails on the edges.  Have any leaves, snow, or ice cleared regularly.  Use sand or salt on walking paths during winter.  Clean up any spills in your garage right away. This includes oil or grease spills. What can I do in the bathroom?  Use night lights.  Install grab bars by the toilet and in the tub and shower. Do not use towel bars as grab bars.  Use non-skid mats or decals in the tub or shower.  If you need to sit down in the shower, use a plastic, non-slip stool.  Keep the floor dry. Clean up any water that spills on the floor as soon as it happens.  Remove soap buildup in the tub or shower regularly.  Attach bath mats securely with double-sided non-slip rug tape.  Do not have throw rugs and other things on the floor that can make  you trip. What can I do in the bedroom?  Use night lights.  Make sure that you have a light by your bed that is easy to reach.  Do not use any sheets or blankets that are too big for your bed. They should not hang down onto the floor.  Have a firm chair that has side arms. You can use this for support while you get dressed.  Do not have throw rugs and other things on the floor that can make you trip. What can I do in the kitchen?  Clean up any spills right away.  Avoid walking on wet floors.  Keep items that you use a lot in easy-to-reach places.  If you need to reach something above you, use a strong step stool that has a grab bar.  Keep electrical cords out of the way.  Do not use floor polish or wax that makes floors slippery. If you must use wax, use non-skid floor wax.  Do not have throw rugs and other things on the floor that can make you trip. What can I do with my stairs?  Do not leave any items on the stairs.  Make sure that  there are handrails on both sides of the stairs and use them. Fix handrails that are broken or loose. Make sure that handrails are as long as the stairways.  Check any carpeting to make sure that it is firmly attached to the stairs. Fix any carpet that is loose or worn.  Avoid having throw rugs at the top or bottom of the stairs. If you do have throw rugs, attach them to the floor with carpet tape.  Make sure that you have a light switch at the top of the stairs and the bottom of the stairs. If you do not have them, ask someone to add them for you. What else can I do to help prevent falls?  Wear shoes that:  Do not have high heels.  Have rubber bottoms.  Are comfortable and fit you well.  Are closed at the toe. Do not wear sandals.  If you use a stepladder:  Make sure that it is fully opened. Do not climb a closed stepladder.  Make sure that both sides of the stepladder are locked into place.  Ask someone to hold it for you, if  possible.  Clearly mark and make sure that you can see:  Any grab bars or handrails.  First and last steps.  Where the edge of each step is.  Use tools that help you move around (mobility aids) if they are needed. These include:  Canes.  Walkers.  Scooters.  Crutches.  Turn on the lights when you go into a dark area. Replace any light bulbs as soon as they burn out.  Set up your furniture so you have a clear path. Avoid moving your furniture around.  If any of your floors are uneven, fix them.  If there are any pets around you, be aware of where they are.  Review your medicines with your doctor. Some medicines can make you feel dizzy. This can increase your chance of falling. Ask your doctor what other things that you can do to help prevent falls. This information is not intended to replace advice given to you by your health care provider. Make sure you discuss any questions you have with your health care provider. Document Released: 01/25/2009 Document Revised: 09/06/2015 Document Reviewed: 05/05/2014 Elsevier Interactive Patient Education  2017 [employer].

## 2017-12-01 NOTE — Progress Notes (Signed)
Subjective:   Crystal Petersen is a 84 y.o. female who presents for Medicare Annual (Subsequent) preventive examination at [facility]  Last AWV-11/28/2016       Objective:     Vitals: BP 122/70 (BP Location: Left Arm, Patient Position: Sitting)   Pulse 80   Temp 98.1 F (36.7 C) (Oral)   Ht 5\' 1"  (1.549 m)   Wt 133 lb (60.3 kg)   SpO2 97%   BMI 25.13 kg/m   Body mass index is 25.13 kg/m.  Advanced Directives 12/01/2017 11/11/2017 10/05/2017 09/09/2017 08/03/2017 06/16/2017 05/22/2017  Does Patient Have a Medical Advance Directive? Yes Yes Yes Yes Yes Yes Yes  Type of Advance Directive Out of facility DNR (pink MOST or yellow form) Out of facility DNR (pink MOST or yellow form) Out of facility DNR (pink MOST or yellow form) Out of facility DNR (pink MOST or yellow form) Out of facility DNR (pink MOST or yellow form) Out of facility DNR (pink MOST or yellow form) Out of facility DNR (pink MOST or yellow form)  Does patient want to make changes to medical advance directive? No - Patient declined No - Patient declined No - Patient declined No - Patient declined No - Patient declined No - Patient declined No - Patient declined  Copy of [place] in [place] out of facility DNR order (yellow form or pink MOST form) Yellow form placed in chart (order not valid for inpatient use) Yellow form placed in chart (order not valid for inpatient use) Yellow form placed in chart (order not valid for inpatient use) Yellow form placed in chart (order not valid for inpatient use) Yellow form placed in chart (order not valid for inpatient use) Yellow form placed in chart (order not valid for inpatient use) Yellow form placed in chart (order not valid for inpatient use)    Tobacco Social History   Tobacco Use  Smoking Status Never Smoker  Smokeless Tobacco Never Used     Counseling given: Not Answered   Clinical Intake:  Pre-visit  preparation completed: No  Pain : No/denies pain     Nutritional Risks: None Diabetes: No  How often do you need to have someone help you when you read instructions, pamphlets, or other written materials from your doctor or pharmacy?: 4 - Often  Interpreter Needed?: No  Information entered by :: Tyson Dense, RN  Past Medical History:  Diagnosis Date  . Acute blood loss anemia 03/29/2014   03/30/14 Hgb 7.3 04/04/14 Hgb 8.5 continue Fe bid.  05/04/14 Hgb 10.8   . Benign neoplasm of colon   . Cancer ([place])    pre-melanoma  . Cystocele   . Dementia of the Alzheimer's type 05/22/2014   05/19/14 MMSE 18/30 Namenda.    . Diastolic dysfunction 1/61/0960  . Diverticulosis of colon (without mention of hemorrhage)   . Diverticulosis of large intestine 06/28/2004  . Esophageal reflux   . Essential hypertension 07/05/2013  . Gastroparesis   . Hemorrhoids   . HTN (hypertension)   . Hyperlipidemia 07/05/2013  . Hypertension   . Hypothyroidism   . Internal hemorrhoids without mention of complication 45/07/979  . Macular degeneration   . Nonspecific abnormal electrocardiogram (ECG) (EKG)   . Osteoarthritis    right hip  . Other and unspecified hyperlipidemia   . Prediabetes 11/17/2013  . Primary osteoarthritis of right hip 03/27/2014  . Shingles   .  Skin cancer (melanoma) ([place])   . Stricture and stenosis of esophagus   . Stricture and stenosis of esophagus 10/27/2007  . Vitamin D deficiency 11/17/2013   Past Surgical History:  Procedure Laterality Date  . APPENDECTOMY    . BCC resection     legs/face  . CATARACT EXTRACTION, BILATERAL    . CHOLECYSTECTOMY    . FLEXIBLE SIGMOIDOSCOPY  05/12/2011   Procedure: FLEXIBLE SIGMOIDOSCOPY;  Surgeon: Inda Castle, MD;  Location: [facility];  Service: Endoscopy;  Laterality: N/A;  . FLEXIBLE SIGMOIDOSCOPY  09/10/2011   Procedure: FLEXIBLE SIGMOIDOSCOPY;  Surgeon: Inda Castle, MD;  [facility];  Service: Endoscopy;  Laterality:  N/A;  . FLEXIBLE SIGMOIDOSCOPY N/A 08/16/2012   Procedure: FLEXIBLE SIGMOIDOSCOPY;  Surgeon: Inda Castle, MD;  [facility];  Service: Endoscopy;  Laterality: N/A;  . FRACTURE SURGERY     left hip pin  . GALLBLADDER SURGERY  2002  . HEMORRHOID BANDING N/A 08/16/2012   Procedure: HEMORRHOID BANDING;  Surgeon: Inda Castle, MD;  [facility];  Service: Endoscopy;  Laterality: N/A;  . HEMORRHOID SURGERY    . left eye surgery    . melenoma resection     right arm  . THYROIDECTOMY    . TONSILLECTOMY    . TOTAL HIP ARTHROPLASTY Right 03/27/2014   Procedure: TOTAL HIP ARTHROPLASTY ANTERIOR APPROACH;  Surgeon: Alta Corning, MD;  Location: [facility];  Service: Orthopedics;  Laterality: Right;   Family History  Problem Relation Age of Onset  . Heart disease Father   . Hypertension Father   . Heart attack Father   . Esophageal cancer Brother   . Diabetes Daughter   . Colon cancer Neg Hx   . Malignant hyperthermia Neg Hx    Social History   Socioeconomic History  . Marital status: Widowed    Spouse name: Not on file  . Number of children: 26  . Years of education: Not on file  . Highest education level: Not on file  Occupational History  . Occupation: retired Marine scientist  Social Needs  . Financial resource strain: Not hard at all  . Food insecurity:    Worry: Never true    Inability: Never true  . Transportation needs:    Medical: No    Non-medical: No  Tobacco Use  . Smoking status: Never Smoker  . Smokeless tobacco: Never Used  Substance and Sexual Activity  . Alcohol use: No  . Drug use: No  . Sexual activity: Never  Lifestyle  . Physical activity:    Days per week: 0 days    Minutes per session: 0 min  . Stress: Not at all  Relationships  . Social connections:    Talks on phone: Never    Gets together: Once a week    Attends religious service: Never    Active member of club or organization: No    Attends meetings of clubs or organizations: Never     Relationship status: Widowed  Other Topics Concern  . 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    Pt does not get regular exercise       Outpatient Encounter Medications as of 12/01/2017  Medication Sig  . acetaminophen (TYLENOL) 500 MG tablet Take 500 mg  by mouth every 8 (eight) hours as needed for mild pain or moderate pain.   Marland Kitchen aspirin EC 81 MG tablet Take 81 mg by mouth daily.  . calcium carbonate (CAL-GEST ANTACID) 500 MG chewable tablet Chew 1 tablet by mouth daily as needed for indigestion or heartburn.  . carboxymethylcellulose (REFRESH PLUS) 0.5 % SOLN Place 1 drop into both eyes as needed.   . Cholecalciferol (VITAMIN D) 2000 UNITS tablet Take 4,000 Units by mouth daily.  Marland Kitchen guaiFENesin (TUSSIN) 100 MG/5ML liquid Take by mouth every 6 (six) hours as needed for cough (10 mL).  Marland Kitchen labetalol (NORMODYNE) 100 MG tablet Take 100 mg by mouth 2 (two) times daily.  Marland Kitchen levothyroxine (SYNTHROID, LEVOTHROID) 100 MCG tablet Take 100 mcg by mouth daily before breakfast.  . lisinopril (PRINIVIL,ZESTRIL) 5 MG tablet Take 5 mg by mouth daily.  . memantine (NAMENDA XR) 28 MG CP24 24 hr capsule Take 28 mg by mouth daily.  . Multiple Vitamins-Minerals (PRESERVISION AREDS 2) CAPS Take 1 capsule by mouth 2 (two) times daily.  . nitroGLYCERIN (NITROSTAT) 0.3 MG SL tablet Place 0.3 mg under the tongue every 5 (five) minutes as needed for chest pain.  Marland Kitchen omeprazole (PRILOSEC) 20 MG capsule Take 20 mg by mouth daily.  . polyethylene glycol (MIRALAX / GLYCOLAX) packet Take 17 g by mouth every other day.   . protein supplement (RESOURCE BENEPROTEIN) POWD Take 1 scoop by mouth 2 (two) times daily.   Marland Kitchen pyrithione zinc (HEAD AND SHOULDERS) 1 % shampoo Apply 1 application topically 2 (two) times a week.   . sennosides-docusate sodium (SENOKOT-S) 8.6-50 MG  tablet Take 2 tablets by mouth at bedtime.    No facility-administered encounter medications on file as of 12/01/2017.     Activities of Daily Living In your present state of health, do you have any difficulty performing the following activities: 12/01/2017  Hearing? Y  Vision? N  Difficulty concentrating or making decisions? Y  Walking or climbing stairs? Y  Dressing or bathing? Y  Doing errands, shopping? Y  Preparing Food and eating ? Y  Using the Toilet? N  In the past six months, have you accidently leaked urine? N  Do you have problems with loss of bowel control? N  Managing your Medications? Y  Managing your Finances? Y  Housekeeping or managing your Housekeeping? Y  Some recent data might be hidden    Patient Care Team: Blanchie Serve, MD as PCP - General (Internal Medicine) Zadie Rhine Clent Demark, MD as Consulting Physician (Ophthalmology) Burnell Blanks, MD as Consulting Physician (Cardiology) Inda Castle, MD (Inactive) as Consulting Physician (Gastroenterology) Jari Pigg, MD as Consulting Physician (Dermatology) Dorna Leitz, MD as Consulting Physician (Orthopedic Surgery) Mast, Man X, NP as Nurse Practitioner (Internal Medicine)    Assessment:   This is a routine wellness examination for [place].  Exercise Activities and Dietary recommendations Current Exercise Habits: The patient does not participate in regular exercise at present, Exercise limited by: orthopedic condition(s)  Goals   None     Fall Risk Fall Risk  12/01/2017 11/28/2016 05/22/2014 10/06/2013 06/02/2013  Falls in the past year? No No No No No  Risk for fall due to : - - - History of fall(s);Impaired balance/gait;Impaired mobility Impaired mobility   Is the patient's home free of loose throw rugs in walkways, pet beds, electrical cords, etc?   yes      Grab bars in the bathroom? yes      Handrails on the stairs?  yes      Adequate lighting?   yes  Depression Screen PHQ 2/9 Scores  12/01/2017 11/28/2016 05/22/2014 10/06/2013  PHQ - 2 Score 0 0 0 0     Cognitive Function MMSE - Mini Mental State Exam 12/01/2017 02/12/2017 11/28/2016  Orientation to time 0 1 0  Orientation to Place 2 0 2  Registration 3 3 3   Attention/ Calculation 0 4 5  Recall 0 0 0  Language- name 2 objects 2 2 2   Language- repeat 1 1 1   Language- follow 3 step command 3 3 3   Language- read & follow direction 1 1 1   Write a sentence 1 1 1   Copy design 0 0 0  Total score 13 16 18         Immunization History  Administered Date(s) Administered  . Influenza, High Dose Seasonal PF 12/27/2013  . Influenza-Unspecified 01/12/2013, 01/02/2015, 01/29/2016, 02/03/2017  . Pneumococcal-Unspecified 07/01/2012  . Td 05/27/2004  . Tdap 02/21/2012  . Zoster 04/15/2007    Qualifies for Shingles Vaccine? Not in past records  Screening Tests Health Maintenance  Topic Date Due  . PNA vac Low Risk Adult (2 of 2 - PCV13) 07/01/2013  . INFLUENZA VACCINE  11/12/2017  . TETANUS/TDAP  02/20/2022  . DEXA SCAN  Completed    Cancer Screenings: Lung: Low Dose CT Chest recommended if Age 13-80 years, 30 pack-year currently smoking OR have quit w/in 15years. Patient does not qualify. Breast:  Up to date on Mammogram? Yes   Up to date of Bone Density/Dexa? Yes Colorectal: up to date  Additional Screenings:  Hepatitis C Screening: declined Prevnar due: ordered Flu shot due: will get at [facility]:  I have personally reviewed and addressed the Medicare Annual Wellness questionnaire and have noted the following in the patient's chart:  A. Medical and social history B. Use of alcohol, tobacco or illicit drugs  C. Current medications and supplements D. Functional ability and status E.  Nutritional status F.  Physical activity G. Advance directives H. List of other physicians I.  Hospitalizations, surgeries, and ER visits in previous 12 months J.  [place] to include hearing, vision, cognitive,  depression L. Referrals and appointments - none  In addition, I have reviewed and discussed with patient certain preventive protocols, quality metrics, and best practice recommendations. A written personalized care plan for preventive services as well as general preventive health recommendations were provided to patient.  See attached scanned questionnaire for additional information.   Signed,   Tyson Dense, RN Nurse Health Advisor  Patient Concerns: None

## 2017-12-11 ENCOUNTER — Encounter: Payer: Self-pay | Admitting: Nurse Practitioner

## 2017-12-11 ENCOUNTER — Non-Acute Institutional Stay (SKILLED_NURSING_FACILITY): Payer: Medicare Other | Admitting: Nurse Practitioner

## 2017-12-11 DIAGNOSIS — G301 Alzheimer's disease with late onset: Secondary | ICD-10-CM

## 2017-12-11 DIAGNOSIS — K219 Gastro-esophageal reflux disease without esophagitis: Secondary | ICD-10-CM | POA: Diagnosis not present

## 2017-12-11 DIAGNOSIS — F0280 Dementia in other diseases classified elsewhere without behavioral disturbance: Secondary | ICD-10-CM

## 2017-12-11 DIAGNOSIS — I10 Essential (primary) hypertension: Secondary | ICD-10-CM | POA: Diagnosis not present

## 2017-12-11 NOTE — Assessment & Plan Note (Signed)
No constipation,  Senokot S II qhs, MiraLax qod. GERD stable on Omeprazole 20mg  qd.

## 2017-12-11 NOTE — Assessment & Plan Note (Addendum)
HPI is provided with assistance of staff due to her dementia, continue Memantine 28mg  qd, resides in [facility], ambulates with walker. Update CBC CMP

## 2017-12-11 NOTE — Assessment & Plan Note (Signed)
GERD stable, continue Omeprazole 20mg qd.  

## 2017-12-11 NOTE — Assessment & Plan Note (Signed)
HTN, SBP elevates occasionally in 150-160s, she denies headache, dizziness, change of vision, chest pain/pressure, palpitation, SOB, nausea, vomiting, or focal weakness. Continue  Lisinopril 5mg  qd, Labetalol 100mg  bid. Check VS every shift x 1 week, then re-evaluate.

## 2017-12-11 NOTE — Progress Notes (Signed)
Location:  [facility] Room Number: 18 Place of Service:  SNF (31) Provider:  Caelyn Route, ManXie  NP  Blanchie Serve, MD  Patient Care Team: Blanchie Serve, MD as PCP - General (Internal Medicine) Zadie Rhine Clent Demark, MD as Consulting Physician (Ophthalmology) Burnell Blanks, MD as Consulting Physician (Cardiology) Inda Castle, MD (Inactive) as Consulting Physician (Gastroenterology) Jari Pigg, MD as Consulting Physician (Dermatology) Dorna Leitz, MD as Consulting Physician (Orthopedic Surgery) Suprena Travaglini X, NP as Nurse Practitioner (Internal Medicine)  Extended Emergency Contact Information Primary Emergency Contact: [facility] Address: [address]          [place], [place] [address] Johnnette Litter of [place] Phone: [phone] Relation: Daughter Secondary Emergency Contact: [place], [place], [place] [address] Johnnette Litter of [place] Phone: [phone] Relation: Son  Code Status:  DNR Goals of care: Advanced Directive information Advanced Directives 12/01/2017  Does Patient Have a Medical Advance Directive? Yes  Type of Advance Directive Out of facility DNR (pink MOST or yellow form)  Does patient want to make changes to medical advance directive? No - Patient declined  Copy of [place] in Chart? -  Pre-existing out of facility DNR order (yellow form or pink MOST form) Yellow form placed in chart (order not valid for inpatient use)     Chief Complaint  Patient presents with  . Medical Management of Chronic Issues    F/u- HTN, GERD, Hypothyroidism, constipation,     HPI:  Pt is a 84 y.o. female seen today for medical management of chronic diseases.     The patient has history of HTN, SBP elevates occasionally in 150-160s, she denies headache, dizziness, change of vision, chest pain/pressure, palpitation, SOB, nausea, vomiting, or focal weakness. He takes Lisinopril 5mg  qd, Labetalol 100mg  bid. HPI is  provided with assistance of staff due to her dementia, she is on Memantine 28mg  qd, resides in [facility], ambulates with walker. No constipation while on Senokot S II qhs, MiraLax qod. GERD stable on Omeprazole 20mg  qd.  Past Medical History:  Diagnosis Date  . Acute blood loss anemia 03/29/2014   03/30/14 Hgb 7.3 04/04/14 Hgb 8.5 continue Fe bid.  05/04/14 Hgb 10.8   . Benign neoplasm of colon   . Cancer ([place])    pre-melanoma  . Cystocele   . Dementia of the Alzheimer's type 05/22/2014   05/19/14 MMSE 18/30 Namenda.    . Diastolic dysfunction 4/62/7035  . Diverticulosis of colon (without mention of hemorrhage)   . Diverticulosis of large intestine 06/28/2004  . Esophageal reflux   . Essential hypertension 07/05/2013  . Gastroparesis   . Hemorrhoids   . HTN (hypertension)   . Hyperlipidemia 07/05/2013  . Hypertension   . Hypothyroidism   . Internal hemorrhoids without mention of complication 00/12/3816  . Macular degeneration   . Nonspecific abnormal electrocardiogram (ECG) (EKG)   . Osteoarthritis    right hip  . Other and unspecified hyperlipidemia   . Prediabetes 11/17/2013  . Primary osteoarthritis of right hip 03/27/2014  . Shingles   . Skin cancer (melanoma) ([place])   . Stricture and stenosis of esophagus   . Stricture and stenosis of esophagus 10/27/2007  . Vitamin D deficiency 11/17/2013   Past Surgical History:  Procedure Laterality Date  . APPENDECTOMY    . BCC resection     legs/face  . CATARACT EXTRACTION, BILATERAL    . CHOLECYSTECTOMY    .  FLEXIBLE SIGMOIDOSCOPY  05/12/2011   Procedure: FLEXIBLE SIGMOIDOSCOPY;  Surgeon: Inda Castle, MD;  [facility];  Service: Endoscopy;  Laterality: N/A;  . FLEXIBLE SIGMOIDOSCOPY  09/10/2011   Procedure: FLEXIBLE SIGMOIDOSCOPY;  Surgeon: Inda Castle, MD;  [facility];  Service: Endoscopy;  Laterality: N/A;  . FLEXIBLE SIGMOIDOSCOPY N/A 08/16/2012   Procedure: FLEXIBLE SIGMOIDOSCOPY;  Surgeon: Inda Castle, MD;   [facility];  Service: Endoscopy;  Laterality: N/A;  . FRACTURE SURGERY     left hip pin  . GALLBLADDER SURGERY  2002  . HEMORRHOID BANDING N/A 08/16/2012   Procedure: HEMORRHOID BANDING;  Surgeon: Inda Castle, MD;  [facility];  Service: Endoscopy;  Laterality: N/A;  . HEMORRHOID SURGERY    . left eye surgery    . melenoma resection     right arm  . THYROIDECTOMY    . TONSILLECTOMY    . TOTAL HIP ARTHROPLASTY Right 03/27/2014   Procedure: TOTAL HIP ARTHROPLASTY ANTERIOR APPROACH;  Surgeon: Alta Corning, MD;  Location: [facility];  Service: Orthopedics;  Laterality: Right;    Allergies  Allergen Reactions  . Ace Inhibitors Other (See Comments)    Elevated potassium  . Statins Other (See Comments)    Myalgias   . Vicodin [Hydrocodone-Acetaminophen] Other (See Comments)    "felt spaced out"  . Fosamax [Alendronate Sodium] Other (See Comments)  . Metoclopramide Hcl Other (See Comments)  . Sulfa Antibiotics Rash  . Sulfonamide Derivatives Rash  . Vesicare [Solifenacin] Other (See Comments)    Outpatient Encounter Medications as of 12/11/2017  Medication Sig  . acetaminophen (TYLENOL) 500 MG tablet Take 500 mg by mouth every 8 (eight) hours as needed for mild pain or moderate pain.   Marland Kitchen aspirin EC 81 MG tablet Take 81 mg by mouth daily.  . calcium carbonate (CAL-GEST ANTACID) 500 MG chewable tablet Chew 1 tablet by mouth daily as needed for indigestion or heartburn.  . carboxymethylcellulose (REFRESH PLUS) 0.5 % SOLN Place 1 drop into both eyes as needed.   . Cholecalciferol (VITAMIN D) 2000 UNITS tablet Take 4,000 Units by mouth daily.  Marland Kitchen guaiFENesin (TUSSIN) 100 MG/5ML liquid Take by mouth every 6 (six) hours as needed for cough (10 mL).  Marland Kitchen labetalol (NORMODYNE) 100 MG tablet Take 100 mg by mouth 2 (two) times daily.  Marland Kitchen levothyroxine (SYNTHROID, LEVOTHROID) 100 MCG tablet Take 100 mcg by mouth daily before breakfast.  . lisinopril (PRINIVIL,ZESTRIL) 5 MG  tablet Take 5 mg by mouth daily.  . memantine (NAMENDA XR) 28 MG CP24 24 hr capsule Take 28 mg by mouth daily.  . Multiple Vitamins-Minerals (PRESERVISION AREDS 2) CAPS Take 1 capsule by mouth 2 (two) times daily.  . nitroGLYCERIN (NITROSTAT) 0.3 MG SL tablet Place 0.3 mg under the tongue every 5 (five) minutes as needed for chest pain.  Marland Kitchen omeprazole (PRILOSEC) 20 MG capsule Take 20 mg by mouth daily.  . polyethylene glycol (MIRALAX / GLYCOLAX) packet Take 17 g by mouth every other day.   . protein supplement (RESOURCE BENEPROTEIN) POWD Take 1 scoop by mouth 2 (two) times daily.   Marland Kitchen pyrithione zinc (HEAD AND SHOULDERS) 1 % shampoo Apply 1 application topically 2 (two) times a week.   . sennosides-docusate sodium (SENOKOT-S) 8.6-50 MG tablet Take 2 tablets by mouth at bedtime.    No facility-administered encounter medications on file as of 12/11/2017.    ROS was provided with assistance of staff Review of Systems  Constitutional: Negative for  activity change, appetite change, chills, diaphoresis, fatigue and fever.  HENT: Positive for hearing loss. Negative for congestion and voice change.   Respiratory: Negative for cough, shortness of breath and wheezing.   Cardiovascular: Negative for chest pain, palpitations and leg swelling.  Gastrointestinal: Negative for abdominal distention, abdominal pain, constipation, diarrhea, nausea and vomiting.  Genitourinary: Negative for difficulty urinating, dysuria and urgency.  Musculoskeletal: Positive for gait problem.       Uses walker.   Skin: Negative for color change and pallor.  Neurological: Negative for dizziness, speech difficulty, weakness and headaches.       Dementia  Psychiatric/Behavioral: Positive for confusion. Negative for agitation, behavioral problems, hallucinations and sleep disturbance. The patient is not nervous/anxious.     Immunization History  Administered Date(s) Administered  . Influenza, High Dose Seasonal PF 12/27/2013    . Influenza-Unspecified 01/12/2013, 01/02/2015, 01/29/2016, 02/03/2017  . Pneumococcal-Unspecified 07/01/2012  . Td 05/27/2004  . Tdap 02/21/2012  . Zoster 04/15/2007   Pertinent  Health Maintenance Due  Topic Date Due  . PNA vac Low Risk Adult (2 of 2 - PCV13) 07/01/2013  . INFLUENZA VACCINE  11/12/2017  . DEXA SCAN  Completed   Fall Risk  12/01/2017 11/28/2016 05/22/2014 10/06/2013 06/02/2013  Falls in the past year? No No No No No  Risk for fall due to : - - - History of fall(s);Impaired balance/gait;Impaired mobility Impaired mobility   Functional Status Survey:    Vitals:   12/11/17 1103  BP: (!) 156/62  Pulse: 66  Resp: 16  Temp: 98.4 F (36.9 C)  SpO2: 96%  Weight: 135 lb 8 oz (61.5 kg)  Height: 5\' 1"  (1.549 m)   Body mass index is 25.6 kg/m. Physical Exam  Constitutional: She appears well-developed and well-nourished.  HENT:  Head: Normocephalic and atraumatic.  Eyes: Pupils are equal, round, and reactive to light. EOM are normal.  Neck: Normal range of motion. Neck supple. No JVD present. No thyromegaly present.  Cardiovascular: Normal rate and regular rhythm.  No murmur heard. Pulmonary/Chest: She has no wheezes. She has no rales.  Abdominal: Soft. Bowel sounds are normal. She exhibits no distension. There is no tenderness.  Musculoskeletal:  Self transfer, ambulates with walker.   Neurological: She is alert. No cranial nerve deficit. She exhibits normal muscle tone. Coordination normal.  Oriented to person and her room on unit.   Skin: Skin is warm and dry.  Psychiatric: She has a normal mood and affect. Her behavior is normal.    Labs reviewed: Recent Labs    03/31/17 06/18/17 08/11/17  NA 141 142 141  K 4.1 3.2* 3.4  CL  --   --  107  CO2  --   --  25  BUN 14 23* 18  CREATININE 1.01 0.7 0.8  CALCIUM 9.2  --  8.6   Recent Labs    02/11/17 03/31/17  AST 13 17  ALT 8 17  ALKPHOS 75 79  PROT  --  6.5  ALBUMIN  --  3.6   Recent Labs     02/11/17 03/31/17 08/04/17  WBC 8.9  --  9.4  NEUTROABS  --   --  6,439  HGB 12.0  --  12.4  HCT 36 44 37  PLT 255 133 250   Lab Results  Component Value Date   TSH 0.98 11/19/2017   Lab Results  Component Value Date   HGBA1C 5.8 (H) 07/06/2014   Lab Results  Component Value Date  CHOL 228 (A) 07/15/2016   HDL 55 07/15/2016   LDLCALC 146 07/15/2016   TRIG 141 07/15/2016   CHOLHDL 3.2 07/06/2014    Significant Diagnostic Results in last 30 days:  No results found.  Assessment/Plan Essential hypertension  HTN, SBP elevates occasionally in 150-160s, she denies headache, dizziness, change of vision, chest pain/pressure, palpitation, SOB, nausea, vomiting, or focal weakness. Continue  Lisinopril 5mg  qd, Labetalol 100mg  bid. Check VS every shift x 1 week, then re-evaluate.   Dementia of the Alzheimer's type HPI is provided with assistance of staff due to her dementia, continue Memantine 28mg  qd, resides in [facility], ambulates with walker. Update CBC CMP  Constipation No constipation,  Senokot S II qhs, MiraLax qod. GERD stable on Omeprazole 20mg  qd.   GERD GERD stable, continue Omeprazole 20mg  qd.       Family/ staff Communication: plan of care reviewed with the patient and charge nurse.   Labs/tests ordered:  CBC CMP one week  Time spend 25 minutes.

## 2017-12-14 DIAGNOSIS — G309 Alzheimer's disease, unspecified: Secondary | ICD-10-CM | POA: Diagnosis not present

## 2017-12-15 ENCOUNTER — Encounter: Payer: Self-pay | Admitting: Nurse Practitioner

## 2018-01-11 ENCOUNTER — Encounter: Payer: Self-pay | Admitting: Nurse Practitioner

## 2018-01-11 ENCOUNTER — Non-Acute Institutional Stay (SKILLED_NURSING_FACILITY): Payer: Medicare Other | Admitting: Nurse Practitioner

## 2018-01-11 DIAGNOSIS — K219 Gastro-esophageal reflux disease without esophagitis: Secondary | ICD-10-CM | POA: Diagnosis not present

## 2018-01-11 DIAGNOSIS — K5900 Constipation, unspecified: Secondary | ICD-10-CM | POA: Diagnosis not present

## 2018-01-11 DIAGNOSIS — F0280 Dementia in other diseases classified elsewhere without behavioral disturbance: Secondary | ICD-10-CM | POA: Diagnosis not present

## 2018-01-11 DIAGNOSIS — G301 Alzheimer's disease with late onset: Secondary | ICD-10-CM

## 2018-01-11 DIAGNOSIS — I10 Essential (primary) hypertension: Secondary | ICD-10-CM

## 2018-01-11 NOTE — Assessment & Plan Note (Signed)
Stable, continue Omeprazole 20mg qd.  

## 2018-01-11 NOTE — Assessment & Plan Note (Signed)
Continue [facility] for safety and care assistance. Continue Memantine 28mg  qd.

## 2018-01-11 NOTE — Assessment & Plan Note (Signed)
Stable, continue Senokot II qhs, MiraLax qod.

## 2018-01-11 NOTE — Assessment & Plan Note (Signed)
Mildly elevated BP in 1180-170/90s, she denied headache, dizziness, chest pain/pressures, palpitation, or SOB associated with elevated Bp readings. Will increase Lisinopril 10mg  qd, continue Labetalol 100mg  bid. Update CMP CBC in one week.

## 2018-01-11 NOTE — Progress Notes (Signed)
Location:  [facility] Room Number: 32 Place of Service:  SNF (31) Provider:  Marlana Latus  NP  Jaquelyne Firkus X, NP  Patient Care Team: Demaya Hardge X, NP as PCP - General (Internal Medicine) Rankin, Clent Demark, MD as Consulting Physician (Ophthalmology) Burnell Blanks, MD as Consulting Physician (Cardiology) Inda Castle, MD (Inactive) as Consulting Physician (Gastroenterology) Jari Pigg, MD as Consulting Physician (Dermatology) Dorna Leitz, MD as Consulting Physician (Orthopedic Surgery) Leinaala Catanese X, NP as Nurse Practitioner (Internal Medicine)  Extended Emergency Contact Information Primary Emergency Contact: [facility] Address: [address]          [place], [place] [address] Johnnette Litter of [place] Phone: [phone] Relation: Daughter Secondary Emergency Contact: Nelta, Caudill [place], [place] [address] Johnnette Litter of [place] Phone: [phone] Relation: Son  Code Status:  DNR Goals of care: Advanced Directive information Advanced Directives 01/11/2018  Does Patient Have a Medical Advance Directive? Yes  Type of Advance Directive Out of facility DNR (pink MOST or yellow form)  Does patient want to make changes to medical advance directive? No - Patient declined  Copy of [place] in Chart? -  Pre-existing out of facility DNR order (yellow form or pink MOST form) Yellow form placed in chart (order not valid for inpatient use)     Chief Complaint  Patient presents with  . Medical Management of Chronic Issues    F/u- HTN, Alzheimers, GERD,    HPI:  Pt is a 84 y.o. female seen today for medical management of chronic diseases.     Hx of HTN, elevated: am 170/80, 180/70, 150/84, noon 142/66, 140/84, 178/80, pm 154/84, 160/96, 172/92. On Lisinopril 5mg  qd, Labetalol 100mg  bid. Hx of dementia, resides in SNF [facility] for safety and care assistance, on Memantine 28mg  qd.  No constipation while on Senokot S II qhs  and MiraLax qod. GERD stable on Omeprazole 20mg  qd.  Past Medical History:  Diagnosis Date  . Acute blood loss anemia 03/29/2014   03/30/14 Hgb 7.3 04/04/14 Hgb 8.5 continue Fe bid.  05/04/14 Hgb 10.8   . Benign neoplasm of colon   . Cancer ([place])    pre-melanoma  . Cystocele   . Dementia of the Alzheimer's type 05/22/2014   05/19/14 MMSE 18/30 [place].    . Diastolic dysfunction 5/46/2703  . Diverticulosis of colon (without mention of hemorrhage)   . Diverticulosis of large intestine 06/28/2004  . Esophageal reflux   . Essential hypertension 07/05/2013  . Gastroparesis   . Hemorrhoids   . HTN (hypertension)   . Hyperlipidemia 07/05/2013  . Hypertension   . Hypothyroidism   . Internal hemorrhoids without mention of complication 50/0/9381  . Macular degeneration   . Nonspecific abnormal electrocardiogram (ECG) (EKG)   . Osteoarthritis    right hip  . Other and unspecified hyperlipidemia   . Prediabetes 11/17/2013  . Primary osteoarthritis of right hip 03/27/2014  . Shingles   . Skin cancer (melanoma) ([place])   . Stricture and stenosis of esophagus   . Stricture and stenosis of esophagus 10/27/2007  . Vitamin D deficiency 11/17/2013   Past Surgical History:  Procedure Laterality Date  . APPENDECTOMY    . BCC resection     legs/face  . CATARACT EXTRACTION, BILATERAL    . CHOLECYSTECTOMY    . FLEXIBLE SIGMOIDOSCOPY  05/12/2011   Procedure: FLEXIBLE SIGMOIDOSCOPY;  Surgeon: Inda Castle, MD;  Location: [facility];  Service: Endoscopy;  Laterality: N/A;  . FLEXIBLE SIGMOIDOSCOPY  09/10/2011   Procedure: FLEXIBLE SIGMOIDOSCOPY;  Surgeon: Inda Castle, MD;  Location: [facility] ENDOSCOPY;  Service: Endoscopy;  Laterality: N/A;  . FLEXIBLE SIGMOIDOSCOPY N/A 08/16/2012   Procedure: FLEXIBLE SIGMOIDOSCOPY;  Surgeon: Inda Castle, MD;  Location: [facility] ENDOSCOPY;  Service: Endoscopy;  Laterality: N/A;  . FRACTURE SURGERY     left hip pin  . GALLBLADDER SURGERY  2002  . HEMORRHOID BANDING N/A  08/16/2012   Procedure: HEMORRHOID BANDING;  Surgeon: Inda Castle, MD;  Location: [facility] ENDOSCOPY;  Service: Endoscopy;  Laterality: N/A;  . HEMORRHOID SURGERY    . left eye surgery    . melenoma resection     right arm  . THYROIDECTOMY    . TONSILLECTOMY    . TOTAL HIP ARTHROPLASTY Right 03/27/2014   Procedure: TOTAL HIP ARTHROPLASTY ANTERIOR APPROACH;  Surgeon: Alta Corning, MD;  Location: [facility];  Service: Orthopedics;  Laterality: Right;    Allergies  Allergen Reactions  . Ace Inhibitors Other (See Comments)    Elevated potassium  . Statins Other (See Comments)    Myalgias   . Vicodin [Hydrocodone-Acetaminophen] Other (See Comments)    "felt spaced out"  . Fosamax [Alendronate Sodium] Other (See Comments)  . Metoclopramide Hcl Other (See Comments)  . Sulfa Antibiotics Rash  . Sulfonamide Derivatives Rash  . Vesicare [Solifenacin] Other (See Comments)    Outpatient Encounter Medications as of 01/11/2018  Medication Sig  . acetaminophen (TYLENOL) 500 MG tablet Take 500 mg by mouth every 8 (eight) hours as needed for mild pain or moderate pain.   Marland Kitchen aspirin EC 81 MG tablet Take 81 mg by mouth daily.  . calcium carbonate (CAL-GEST ANTACID) 500 MG chewable tablet Chew 1 tablet by mouth daily as needed for indigestion or heartburn.  . carboxymethylcellulose (REFRESH PLUS) 0.5 % SOLN Place 1 drop into both eyes as needed.   . Cholecalciferol (VITAMIN D) 2000 UNITS tablet Take 4,000 Units by mouth daily.  Marland Kitchen guaiFENesin (TUSSIN) 100 MG/5ML liquid Take by mouth every 6 (six) hours as needed for cough (10 mL).  Marland Kitchen labetalol (NORMODYNE) 100 MG tablet Take 100 mg by mouth 2 (two) times daily.  Marland Kitchen levothyroxine (SYNTHROID, LEVOTHROID) 100 MCG tablet Take 100 mcg by mouth daily before breakfast.  . lisinopril (PRINIVIL,ZESTRIL) 5 MG tablet Take 5 mg by mouth daily.  . memantine (NAMENDA XR) 28 MG CP24 24 hr capsule Take 28 mg by mouth daily.  . Multiple Vitamins-Minerals (PRESERVISION AREDS  2) CAPS Take 1 capsule by mouth 2 (two) times daily.  . nitroGLYCERIN (NITROSTAT) 0.3 MG SL tablet Place 0.3 mg under the tongue every 5 (five) minutes as needed for chest pain.  Marland Kitchen omeprazole (PRILOSEC) 20 MG capsule Take 20 mg by mouth daily.  . polyethylene glycol (MIRALAX / GLYCOLAX) packet Take 17 g by mouth every other day.   . protein supplement (RESOURCE BENEPROTEIN) POWD Take 1 scoop by mouth 2 (two) times daily.   Marland Kitchen pyrithione zinc (HEAD AND SHOULDERS) 1 % shampoo Apply 1 application topically 2 (two) times a week.   . sennosides-docusate sodium (SENOKOT-S) 8.6-50 MG tablet Take 2 tablets by mouth at bedtime.    No facility-administered encounter medications on file as of 01/11/2018.    ROS was provided with assistance of staff Review of Systems  Constitutional: Negative for activity change, appetite change, chills, diaphoresis, fatigue, fever and unexpected weight change.  HENT: Positive for hearing loss.  Negative for congestion and voice change.   Respiratory: Negative for cough, shortness of breath and wheezing.   Cardiovascular: Negative for chest pain, palpitations and leg swelling.  Gastrointestinal: Negative for abdominal distention, abdominal pain, constipation, diarrhea, nausea and vomiting.  Genitourinary: Negative for difficulty urinating, dysuria and urgency.  Musculoskeletal: Positive for arthralgias and gait problem.  Skin: Negative for color change and pallor.  Neurological: Negative for dizziness, speech difficulty, weakness and headaches.       Dementia  Psychiatric/Behavioral: Positive for confusion. Negative for agitation, behavioral problems, hallucinations and sleep disturbance. The patient is not nervous/anxious.     Immunization History  Administered Date(s) Administered  . Influenza, High Dose Seasonal PF 12/27/2013  . Influenza-Unspecified 01/12/2013, 01/02/2015, 01/29/2016, 02/03/2017  . Pneumococcal-Unspecified 07/01/2012  . Td 05/27/2004  . Tdap  02/21/2012  . Zoster 04/15/2007   Pertinent  Health Maintenance Due  Topic Date Due  . PNA vac Low Risk Adult (2 of 2 - PCV13) 07/01/2013  . INFLUENZA VACCINE  11/12/2017  . DEXA SCAN  Completed   Fall Risk  12/01/2017 11/28/2016 05/22/2014 10/06/2013 06/02/2013  Falls in the past year? No No No No No  Risk for fall due to : - - - History of fall(s);Impaired balance/gait;Impaired mobility Impaired mobility   Functional Status Survey:    Vitals:   01/11/18 1429  BP: 132/70  Pulse: 70  Resp: 18  Temp: 97.8 F (36.6 C)  SpO2: 96%  Weight: 136 lb 8 oz (61.9 kg)  Height: 5\' 1"  (1.549 m)   Body mass index is 25.79 kg/m. Physical Exam  Constitutional: She appears well-developed and well-nourished.  HENT:  Head: Normocephalic and atraumatic.  Eyes: Pupils are equal, round, and reactive to light. EOM are normal.  Neck: Normal range of motion. Neck supple. No JVD present. No thyromegaly present.  Cardiovascular: Normal rate and regular rhythm.  No murmur heard. Pulmonary/Chest: Effort normal. She has no wheezes. She has no rales.  Abdominal: Soft. She exhibits no distension. There is no tenderness. There is no rebound and no guarding.  Musculoskeletal: She exhibits no edema.  Self transfer, ambulates with walker.   Neurological: She is alert. No cranial nerve deficit. She exhibits normal muscle tone. Coordination normal.  Oriented to person and her room on unit.   Skin: Skin is warm and dry.  Psychiatric: She has a normal mood and affect. Her behavior is normal.    Labs reviewed: Recent Labs    03/31/17 06/18/17 08/11/17  NA 141 142 141  K 4.1 3.2* 3.4  CL  --   --  107  CO2  --   --  25  BUN 14 23* 18  CREATININE 1.01 0.7 0.8  CALCIUM 9.2  --  8.6   Recent Labs    02/11/17 03/31/17  AST 13 17  ALT 8 17  ALKPHOS 75 79  PROT  --  6.5  ALBUMIN  --  3.6   Recent Labs    02/11/17 03/31/17 08/04/17  WBC 8.9  --  9.4  NEUTROABS  --   --  6,439  HGB 12.0  --  12.4  HCT  36 44 37  PLT 255 133 250   Lab Results  Component Value Date   TSH 0.98 11/19/2017   Lab Results  Component Value Date   HGBA1C 5.8 (H) 07/06/2014   Lab Results  Component Value Date   CHOL 228 (A) 07/15/2016   HDL 55 07/15/2016   LDLCALC 146 07/15/2016  TRIG 141 07/15/2016   CHOLHDL 3.2 07/06/2014    Significant Diagnostic Results in last 30 days:  No results found.  Assessment/Plan Essential hypertension Mildly elevated BP in 1180-170/90s, she denied headache, dizziness, chest pain/pressures, palpitation, or SOB associated with elevated Bp readings. Will increase Lisinopril 10mg  qd, continue Labetalol 100mg  bid. Update CMP CBC in one week.   GERD Stable, continue Omeprazole 20mg  qd.   Dementia of the Alzheimer's type Continue SNF [facility] for safety and care assistance. Continue Memantine 28mg  qd.   Constipation Stable, continue Senokot II qhs, MiraLax qod.      Family/ staff Communication: plan of care reviewed with the patient and charge nurse.   Labs/tests ordered: CBC CMP   Time spend 25 minutes.

## 2018-01-19 DIAGNOSIS — I10 Essential (primary) hypertension: Secondary | ICD-10-CM | POA: Diagnosis not present

## 2018-01-19 LAB — HEPATIC FUNCTION PANEL
ALT: 9 (ref 7–35)
AST: 15 (ref 13–35)
Alkaline Phosphatase: 76 (ref 25–125)
BILIRUBIN, TOTAL: 0.7

## 2018-01-19 LAB — BASIC METABOLIC PANEL
BUN: 25 — AB (ref 4–21)
Creatinine: 0.8 (ref <=1.1)
Glucose: 84
Potassium: 3.9 (ref 3.4–5.3)
SODIUM: 141 (ref 137–147)

## 2018-01-19 LAB — CBC AND DIFFERENTIAL
HEMATOCRIT: 40 (ref 36–46)
HEMOGLOBIN: 13.7 (ref 12.0–16.0)
PLATELETS: 275 (ref 150–399)
WBC: 9.5

## 2018-01-20 ENCOUNTER — Other Ambulatory Visit: Payer: Self-pay | Admitting: *Deleted

## 2018-01-20 LAB — COMPLETE METABOLIC PANEL WITH GFR
Absolute Mononuclear Cells: 998
Albumin: 3.8
CO2: 27
Calcium: 9.3
Chloride: 105
EGFR (Non-African Amer.): 68
GLOBULIN: 2.2
Total Protein: 6 g/dL

## 2018-02-11 ENCOUNTER — Encounter: Payer: Self-pay | Admitting: Family Medicine

## 2018-02-11 ENCOUNTER — Non-Acute Institutional Stay (SKILLED_NURSING_FACILITY): Payer: Medicare Other | Admitting: Family Medicine

## 2018-02-11 DIAGNOSIS — I10 Essential (primary) hypertension: Secondary | ICD-10-CM | POA: Diagnosis not present

## 2018-02-11 DIAGNOSIS — F0280 Dementia in other diseases classified elsewhere without behavioral disturbance: Secondary | ICD-10-CM | POA: Diagnosis not present

## 2018-02-11 DIAGNOSIS — R2681 Unsteadiness on feet: Secondary | ICD-10-CM | POA: Diagnosis not present

## 2018-02-11 DIAGNOSIS — I5032 Chronic diastolic (congestive) heart failure: Secondary | ICD-10-CM

## 2018-02-11 DIAGNOSIS — G301 Alzheimer's disease with late onset: Secondary | ICD-10-CM | POA: Diagnosis not present

## 2018-02-11 NOTE — Progress Notes (Signed)
Provider:  Alain Honey, MD Location:  [facility] Room Number: 77 Place of Service:  SNF (31)  PCP: Mast, Man X, NP Patient Care Team: Mast, Man X, NP as PCP - General (Internal Medicine) Rankin, Clent Demark, MD as Consulting Physician (Ophthalmology) Burnell Blanks, MD as Consulting Physician (Cardiology) Inda Castle, MD (Inactive) as Consulting Physician (Gastroenterology) Jari Pigg, MD as Consulting Physician (Dermatology) Dorna Leitz, MD as Consulting Physician (Orthopedic Surgery) Mast, Man X, NP as Nurse Practitioner (Internal Medicine)  Extended Emergency Contact Information Primary Emergency Contact: [facility] Address: [address]          [place], [place] [address] Johnnette Litter of [place] Phone: [phone] Relation: Daughter Secondary Emergency Contact: Ken[place], Tena [place], [place] [address] Johnnette Litter of [place] Phone: [phone] Relation: Son  Code Status: DNR Goals of Care: Advanced Directive information Advanced Directives 02/11/2018  Does Patient Have a Medical Advance Directive? Yes  Type of Advance Directive Out of facility DNR (pink MOST or yellow form)  Does patient want to make changes to medical advance directive? No - Patient declined  Copy of [place] in Chart? -  Pre-existing out of facility DNR order (yellow form or pink MOST form) Yellow form placed in chart (order not valid for inpatient use)      Chief Complaint  Patient presents with  . Medical Management of Chronic Issues    F/U- HTN, GERD, constipation, alzheimers,    HPI: Patient is a 84 y.o. female seen today for medical management of chronic problems including: Hypertension, Alzheimer's disease, hypothyroidism, and chronic diastolic congestive heart failure.  I found her napping in her room midmorning.  She was easily awakened and converse with me.  She needs assistance with activities of daily living.  She  uses a walker for ambulation short distances and wheelchair for long distance.  Blood pressure has been effectively treated with lisinopril and labetalol.  She has had some issues with constipation and uses both MiraLAX and senna as an seems to control her problem  Past Medical History:  Diagnosis Date  . Acute blood loss anemia 03/29/2014   03/30/14 Hgb 7.3 04/04/14 Hgb 8.5 continue Fe bid.  05/04/14 Hgb 10.8   . Benign neoplasm of colon   . Cancer ([place])    pre-melanoma  . Cystocele   . Dementia of the Alzheimer's type [facility]) 05/22/2014   05/19/14 MMSE 18/30 Namenda.    . Diastolic dysfunction 9/67/8938  . Diverticulosis of colon (without mention of hemorrhage)   . Diverticulosis of large intestine 06/28/2004  . Esophageal reflux   . Essential hypertension 07/05/2013  . Gastroparesis   . Hemorrhoids   . HTN (hypertension)   . Hyperlipidemia 07/05/2013  . Hypertension   . Hypothyroidism   . Internal hemorrhoids without mention of complication 01/12/7509  . Macular degeneration   . Nonspecific abnormal electrocardiogram (ECG) (EKG)   . Osteoarthritis    right hip  . Other and unspecified hyperlipidemia   . Prediabetes 11/17/2013  . Primary osteoarthritis of right hip 03/27/2014  . Shingles   . Skin cancer (melanoma) ([place])   . Stricture and stenosis of esophagus   . Stricture and stenosis of esophagus 10/27/2007  . Vitamin D deficiency 11/17/2013   Past Surgical History:  Procedure Laterality Date  . APPENDECTOMY    . BCC resection     legs/face  . CATARACT EXTRACTION, BILATERAL    .  CHOLECYSTECTOMY    . FLEXIBLE SIGMOIDOSCOPY  05/12/2011   Procedure: FLEXIBLE SIGMOIDOSCOPY;  Surgeon: Inda Castle, MD;  Location: [facility];  Service: Endoscopy;  Laterality: N/A;  . FLEXIBLE SIGMOIDOSCOPY  09/10/2011   Procedure: FLEXIBLE SIGMOIDOSCOPY;  Surgeon: Inda Castle, MD;  Location: [facility];  Service: Endoscopy;  Laterality: N/A;  . FLEXIBLE SIGMOIDOSCOPY N/A 08/16/2012   Procedure:  FLEXIBLE SIGMOIDOSCOPY;  Surgeon: Inda Castle, MD;  Location: [facility];  Service: Endoscopy;  Laterality: N/A;  . FRACTURE SURGERY     left hip pin  . GALLBLADDER SURGERY  2002  . HEMORRHOID BANDING N/A 08/16/2012   Procedure: HEMORRHOID BANDING;  Surgeon: Inda Castle, MD;  Location: [facility];  Service: Endoscopy;  Laterality: N/A;  . HEMORRHOID SURGERY    . left eye surgery    . melenoma resection     right arm  . THYROIDECTOMY    . TONSILLECTOMY    . TOTAL HIP ARTHROPLASTY Right 03/27/2014   Procedure: TOTAL HIP ARTHROPLASTY ANTERIOR APPROACH;  Surgeon: Alta Corning, MD;  Location: [facility];  Service: Orthopedics;  Laterality: Right;    reports that she has never smoked. She has never used smokeless tobacco. She reports that she does not drink alcohol or use drugs. Social History   Socioeconomic History  . Marital status: Widowed    Spouse name: Not on file  . Number of children: 89  . Years of education: Not on file  . Highest education level: Not on file  Occupational History  . Occupation: retired Marine scientist  Social Needs  . Financial resource strain: Not hard at all  . Food insecurity:    Worry: Never true    Inability: Never true  . Transportation needs:    Medical: No    Non-medical: No  Tobacco Use  . Smoking status: Never Smoker  . Smokeless tobacco: Never Used  Substance and Sexual Activity  . Alcohol use: No  . Drug use: No  . Sexual activity: Never  Lifestyle  . Physical activity:    Days per week: 0 days    Minutes per session: 0 min  . Stress: Not at all  Relationships  . Social connections:    Talks on phone: Never    Gets together: Once a week    Attends religious service: Never    Active member of club or organization: No    Attends meetings of clubs or organizations: Never    Relationship status: Widowed  . Intimate partner violence:    Fear of current or ex partner: No    Emotionally abused: No    Physically abused: No    Forced  sexual activity: No  Other Topics Concern  . 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    Pt does not get regular exercise       Functional Status Survey:    Family History  Problem Relation Age of Onset  . Heart disease Father   . Hypertension Father   . Heart attack Father   . Esophageal cancer Brother   . Diabetes Daughter   . Colon cancer Neg Hx   . Malignant hyperthermia Neg Hx     Health Maintenance  Topic Date Due  . PNA vac Low Risk Adult (  2 of 2 - PCV13) 07/01/2013  . TETANUS/TDAP  02/20/2022  . INFLUENZA VACCINE  Completed  . DEXA SCAN  Completed    Allergies  Allergen Reactions  . Ace Inhibitors Other (See Comments)    Elevated potassium  . Statins Other (See Comments)    Myalgias   . Vicodin [Hydrocodone-Acetaminophen] Other (See Comments)    "felt spaced out"  . Fosamax [Alendronate Sodium] Other (See Comments)  . Metoclopramide Hcl Other (See Comments)  . Sulfa Antibiotics Rash  . Sulfonamide Derivatives Rash  . Vesicare [Solifenacin] Other (See Comments)    Outpatient Encounter Medications as of 02/11/2018  Medication Sig  . acetaminophen (TYLENOL) 500 MG tablet Take 500 mg by mouth every 8 (eight) hours as needed for mild pain or moderate pain.   Marland Kitchen aspirin EC 81 MG tablet Take 81 mg by mouth daily.  . calcium carbonate (CAL-GEST ANTACID) 500 MG chewable tablet Chew 1 tablet by mouth daily as needed for indigestion or heartburn.  . carboxymethylcellulose (REFRESH PLUS) 0.5 % SOLN Place 1 drop into both eyes as needed. For red or dry eyes.  . Cholecalciferol (VITAMIN D) 2000 UNITS tablet Take 4,000 Units by mouth daily.  Marland Kitchen guaiFENesin (TUSSIN) 100 MG/5ML liquid Take by mouth every 6 (six) hours as needed for cough (10 mL).  Marland Kitchen labetalol (NORMODYNE) 100 MG tablet Take 100 mg by mouth 2 (two)  times daily.  Marland Kitchen levothyroxine (SYNTHROID, LEVOTHROID) 100 MCG tablet Take 100 mcg by mouth daily before breakfast.  . lisinopril (PRINIVIL,ZESTRIL) 5 MG tablet Take 5 mg by mouth daily.  . memantine (NAMENDA XR) 28 MG CP24 24 hr capsule Take 28 mg by mouth daily.  . Multiple Vitamins-Minerals (PRESERVISION AREDS 2) CAPS Take 1 capsule by mouth 2 (two) times daily.  . nitroGLYCERIN (NITROSTAT) 0.4 MG SL tablet Place 0.4 mg under the tongue every 5 (five) minutes as needed for chest pain. Give up to 3 times. If no relief call NP/MD.  . omeprazole (PRILOSEC) 20 MG capsule Take 20 mg by mouth daily.  . polyethylene glycol (MIRALAX / GLYCOLAX) packet Take 17 g by mouth every other day.   . protein supplement (RESOURCE BENEPROTEIN) POWD Take 1 scoop by mouth 2 (two) times daily.   Marland Kitchen pyrithione zinc (HEAD AND SHOULDERS) 1 % shampoo Apply 1 application topically 2 (two) times a week. Tues and Fri  . sennosides-docusate sodium (SENOKOT-S) 8.6-50 MG tablet Take 2 tablets by mouth at bedtime.   . [DISCONTINUED] nitroGLYCERIN (NITROSTAT) 0.3 MG SL tablet Place 0.3 mg under the tongue every 5 (five) minutes as needed for chest pain.   No facility-administered encounter medications on file as of 02/11/2018.     Review of Systems  Constitutional: Negative.   HENT: Positive for hearing loss.   Respiratory: Negative.   Cardiovascular: Negative.   Gastrointestinal: Positive for constipation.  Genitourinary: Negative.   Musculoskeletal: Positive for gait problem.  Neurological: Negative for dizziness, seizures, syncope and weakness.  Psychiatric/Behavioral: Positive for confusion.    Vitals:   02/11/18 0834  BP: (!) 148/82  Pulse: 80  Resp: 18  Temp: (!) 97.4 F (36.3 C)  SpO2: 98%  Weight: 136 lb 14.4 oz (62.1 kg)  Height: 5\' 1"  (1.549 m)   Body mass index is 25.87 kg/m. Physical Exam  Constitutional: She appears well-developed and well-nourished.  HENT:  Head: Normocephalic.    Mouth/Throat: Oropharynx is clear and moist.  Eyes: Pupils are equal, round, and reactive to light. EOM are normal.  Neck: Normal range of motion.  Cardiovascular: Normal rate, regular rhythm and normal heart sounds.  Pulmonary/Chest: Effort normal and breath sounds normal.  Abdominal: Soft. Bowel sounds are normal.  Musculoskeletal: Normal range of motion.  Neurological: She is alert.  Oriented to person and place  Skin: Skin is warm.  Psychiatric: She has a normal mood and affect. Her behavior is normal.  Patient retains some of social graces.  Was very courteous and at the end of our time together thanked me for coming to visit with her  Nursing note and vitals reviewed.   Labs reviewed: Basic Metabolic Panel: Recent Labs    03/31/17 06/18/17 08/11/17 01/19/18  NA 141 142 141 141  K 4.1 3.2* 3.4 3.9  CL  --   --  107 105  CO2  --   --  25 27  BUN 14 23* 18 25*  CREATININE 1.01 0.7 0.8 0.8  CALCIUM 9.2  --  8.6 9.3   Liver Function Tests: Recent Labs    03/31/17 01/19/18  AST 17 15  ALT 17 9  ALKPHOS 79 76  PROT 6.5 6.0  ALBUMIN 3.6 3.8   No results for input(s): LIPASE, AMYLASE in the last 8760 hours. No results for input(s): AMMONIA in the last 8760 hours. CBC: Recent Labs    03/31/17 08/04/17 01/19/18  WBC  --  9.4 9.5  NEUTROABS  --  6,439  --   HGB  --  12.4 13.7  HCT 44 37 40  PLT 133 250 275   Cardiac Enzymes: No results for input(s): CKTOTAL, CKMB, CKMBINDEX, TROPONINI in the last 8760 hours. BNP: Invalid input(s): POCBNP Lab Results  Component Value Date   HGBA1C 5.8 (H) 07/06/2014   Lab Results  Component Value Date   TSH 0.98 11/19/2017   No results found for: VITAMINB12 No results found for: FOLATE No results found for: IRON, TIBC, FERRITIN  Imaging and Procedures obtained prior to SNF admission: Ct Chest W Contrast  Result Date: 04/07/2015 CLINICAL DATA:  Left side chest pain, shortness of breath for 4 days EXAM: CT CHEST WITH  CONTRAST TECHNIQUE: Multidetector CT imaging of the chest was performed during intravenous contrast administration. CONTRAST:  59mL OMNIPAQUE IOHEXOL 300 MG/ML  SOLN COMPARISON:  Chest x-ray same day FINDINGS: Sagittal images of the spine shows diffuse osteopenia. Mild degenerative changes thoracic spine. Atherosclerotic calcifications of thoracic aorta are noted. There is no evidence of aortic dissection. Central pulmonary artery is unremarkable. No central pulmonary embolus is noted. There is moderate to large left pleural effusion. Significant compressive atelectasis noted in left lower lobe. There is mild loculated anterior pleural effusion in lingula. Mild atelectasis or infiltrate in lingula. Calcified mediastinal and left hilar lymph nodes are noted probable due to prior granulomatous disease. There is small left pleural effusion and minimal left base posterior atelectasis. Central airways are patent. Trace anterior pericardial effusion. There is no pulmonary edema. The visualized upper abdomen is unremarkable. IMPRESSION: 1. There is moderate to large left pleural effusion. Significant compressive atelectasis noted in left lower lobe. Small amount of fluid noted along the left fissure. Mild loculated pleural effusion in lingula. Small atelectasis or infiltrate in lingula. 2. No aortic aneurysm or dissection. Atherosclerotic calcifications of thoracic aorta. No central pulmonary embolus. 3. There is small right pleural effusion with right base posterior atelectasis. No pulmonary edema. 4. Calcified mediastinal and left hilar lymph node probable due to prior granulomatous disease. 5. Osteopenia and degenerative changes thoracic spine. Electronically Signed  By: Lahoma Crocker M.D.   On: 04/07/2015 20:05   Dg Chest Port 1 View  Result Date: 04/07/2015 CLINICAL DATA:  Pt here with c/o LEFT CP, does not radiate to arm. Pain has subsided some since earlier. Denies hx heart/lung issues. Hx HTN - on meds EXAM:  PORTABLE CHEST - 1 VIEW COMPARISON:  03/20/2014 FINDINGS: Moderate left and smaller right pleural effusions, new since prior exam. Consolidation/atelectasis at the left lung base. Interstitial edema/infiltrates and probable central pulmonary vascular congestion, new since prior study. Heart size of prev to assess due to adjacent opacities. Atheromatous aorta. No pneumothorax. Left humeral surgical neck fracture as before. IMPRESSION: 1. New bilateral edema/infiltrates and asymmetric effusions left greater than right. Electronically Signed   By: Lucrezia Europe M.D.   On: 04/07/2015 17:16    Assessment/Plan 1. Essential hypertension Blood pressures are well controlled on combination lisinopril and labetalol  2. Chronic diastolic heart failure (HCC) No recent complaints of edema or shortness of breath or chest pains  3. Late onset Alzheimer's disease without behavioral disturbance [facility]) Patient is very pleasantly confused.  She takes Namenda for dementia  4. Unstable gait Ambulates with walker and wheelchair   Family/ staff Communication: Findings communicated with staff  Labs/tests ordered: none  Lillette Boxer. Sabra Heck, [place] [address] [place], [place] Office [phone]

## 2018-02-18 DIAGNOSIS — Z23 Encounter for immunization: Secondary | ICD-10-CM | POA: Diagnosis not present

## 2018-03-08 ENCOUNTER — Non-Acute Institutional Stay (SKILLED_NURSING_FACILITY): Payer: Medicare Other | Admitting: Nurse Practitioner

## 2018-03-08 ENCOUNTER — Encounter: Payer: Self-pay | Admitting: Nurse Practitioner

## 2018-03-08 DIAGNOSIS — F0280 Dementia in other diseases classified elsewhere without behavioral disturbance: Secondary | ICD-10-CM

## 2018-03-08 DIAGNOSIS — G301 Alzheimer's disease with late onset: Secondary | ICD-10-CM

## 2018-03-08 DIAGNOSIS — E039 Hypothyroidism, unspecified: Secondary | ICD-10-CM

## 2018-03-08 DIAGNOSIS — K5900 Constipation, unspecified: Secondary | ICD-10-CM

## 2018-03-08 DIAGNOSIS — K219 Gastro-esophageal reflux disease without esophagitis: Secondary | ICD-10-CM | POA: Diagnosis not present

## 2018-03-08 DIAGNOSIS — I10 Essential (primary) hypertension: Secondary | ICD-10-CM | POA: Diagnosis not present

## 2018-03-08 NOTE — Assessment & Plan Note (Signed)
Continue SNF [facility] for safety and care assistance, continue Memantine 28mg qd.  

## 2018-03-08 NOTE — Progress Notes (Signed)
Location:  [place] Room Number: 68 Place of Service:  SNF (31) Provider:  Marlana Latus  NP  Mast, Man X, NP  Patient Care Team: Mast, Man X, NP as PCP - General (Internal Medicine) Rankin, Clent Demark, MD as Consulting Physician (Ophthalmology) Burnell Blanks, MD as Consulting Physician (Cardiology) Inda Castle, MD (Inactive) as Consulting Physician (Gastroenterology) Jari Pigg, MD as Consulting Physician (Dermatology) Dorna Leitz, MD as Consulting Physician (Orthopedic Surgery) Mast, Man X, NP as Nurse Practitioner (Internal Medicine)  Extended Emergency Contact Information Primary Emergency Contact: [facility] Address: [address]          [place], [place] [address] Johnnette Litter of [place] Phone: [phone] Relation: Daughter Secondary Emergency Contact: Clarrisa, [place], [place] [address] Johnnette Litter of [place] Phone: [phone] Relation: Son  Code Status:  DNR Goals of care: Advanced Directive information Advanced Directives 03/08/2018  Does Patient Have a Medical Advance Directive? Yes  Type of Advance Directive Out of facility DNR (pink MOST or yellow form)  Does patient want to make changes to medical advance directive? No - Patient declined  Copy of [place] in Chart? -  Pre-existing out of facility DNR order (yellow form or pink MOST form) Yellow form placed in chart (order not valid for inpatient use)     Chief Complaint  Patient presents with  . Medical Management of Chronic Issues    F/u- HTN, GERD, alzheimers, unsteady gait, constipation    HPI:  Pt is a 84 y.o. female seen today for medical management of chronic diseases.     The patient resides in SNF [facility] for safety and care assistance, on Memantine 28mg  qd. Hypothyroidism, on Levothyroxine 150mcg qd, last TSH 0.98 11/19/17. HTN, blood pressure is in control on Lisinopril 5mg  qd, Labetalol 100mg  bid.  no constipation while  on Senokot S II qhs, MiraLax qod. GERD, stable on Omeprazole 20mg  qd.  Past Medical History:  Diagnosis Date  . Acute blood loss anemia 03/29/2014   03/30/14 Hgb 7.3 04/04/14 Hgb 8.5 continue Fe bid.  05/04/14 Hgb 10.8   . Benign neoplasm of colon   . Cancer ([place])    pre-melanoma  . Cystocele   . Dementia of the Alzheimer's type [facility]) 05/22/2014   05/19/14 MMSE 18/30 [place]enda.    . Diastolic dysfunction 0/97/3532  . Diverticulosis of colon (without mention of hemorrhage)   . Diverticulosis of large intestine 06/28/2004  . Esophageal reflux   . Essential hypertension 07/05/2013  . Gastroparesis   . Hemorrhoids   . HTN (hypertension)   . Hyperlipidemia 07/05/2013  . Hypertension   . Hypothyroidism   . Internal hemorrhoids without mention of complication 99/05/4266  . Macular degeneration   . Nonspecific abnormal electrocardiogram (ECG) (EKG)   . Osteoarthritis    right hip  . Other and unspecified hyperlipidemia   . Prediabetes 11/17/2013  . Primary osteoarthritis of right hip 03/27/2014  . Shingles   . Skin cancer (melanoma) ([place])   . Stricture and stenosis of esophagus   . Stricture and stenosis of esophagus 10/27/2007  . Vitamin D deficiency 11/17/2013   Past Surgical History:  Procedure Laterality Date  . APPENDECTOMY    . BCC resection     legs/face  . CATARACT EXTRACTION, BILATERAL    . CHOLECYSTECTOMY    . FLEXIBLE SIGMOIDOSCOPY  05/12/2011   Procedure: FLEXIBLE SIGMOIDOSCOPY;  Surgeon: Inda Castle, MD;  Location:  [facility];  Service: Endoscopy;  Laterality: N/A;  . FLEXIBLE SIGMOIDOSCOPY  09/10/2011   Procedure: FLEXIBLE SIGMOIDOSCOPY;  Surgeon: Inda Castle, MD;  Location: [facility];  Service: Endoscopy;  Laterality: N/A;  . FLEXIBLE SIGMOIDOSCOPY N/A 08/16/2012   Procedure: FLEXIBLE SIGMOIDOSCOPY;  Surgeon: Inda Castle, MD;  Location: [facility];  Service: Endoscopy;  Laterality: N/A;  . FRACTURE SURGERY     left hip pin  . GALLBLADDER SURGERY  2002  .  HEMORRHOID BANDING N/A 08/16/2012   Procedure: HEMORRHOID BANDING;  Surgeon: Inda Castle, MD;  [facility];  Service: Endoscopy;  Laterality: N/A;  . HEMORRHOID SURGERY    . left eye surgery    . melenoma resection     right arm  . THYROIDECTOMY    . TONSILLECTOMY    . TOTAL HIP ARTHROPLASTY Right 03/27/2014   Procedure: TOTAL HIP ARTHROPLASTY ANTERIOR APPROACH;  Surgeon: Alta Corning, MD;  Location: [place];  Service: Orthopedics;  Laterality: Right;    Allergies  Allergen Reactions  . Ace Inhibitors Other (See Comments)    Elevated potassium  . Statins Other (See Comments)    Myalgias   . Vicodin [Hydrocodone-Acetaminophen] Other (See Comments)    "felt spaced out"  . Fosamax [Alendronate Sodium] Other (See Comments)  . Metoclopramide Hcl Other (See Comments)  . Sulfa Antibiotics Rash  . Sulfonamide Derivatives Rash  . Vesicare [Solifenacin] Other (See Comments)    Outpatient Encounter Medications as of 03/08/2018  Medication Sig  . acetaminophen (TYLENOL) 500 MG tablet Take 500 mg by mouth every 8 (eight) hours as needed for mild pain or moderate pain.   Marland Kitchen aspirin EC 81 MG tablet Take 81 mg by mouth daily.  . calcium carbonate (CAL-GEST ANTACID) 500 MG chewable tablet Chew 1 tablet by mouth daily as needed for indigestion or heartburn.  . carboxymethylcellulose (REFRESH PLUS) 0.5 % SOLN Place 1 drop into both eyes as needed. For red or dry eyes.  . Cholecalciferol (VITAMIN D) 2000 UNITS tablet Take 4,000 Units by mouth daily.  Marland Kitchen guaiFENesin (TUSSIN) 100 MG/5ML liquid Take by mouth every 6 (six) hours as needed for cough (10 mL).  Marland Kitchen labetalol (NORMODYNE) 100 MG tablet Take 100 mg by mouth 2 (two) times daily.  Marland Kitchen levothyroxine (SYNTHROID, LEVOTHROID) 100 MCG tablet Take 100 mcg by mouth daily before breakfast.  . lisinopril (PRINIVIL,ZESTRIL) 5 MG tablet Take 5 mg by mouth daily.  . memantine (NAMENDA XR) 28 MG CP24 24 hr capsule Take 28 mg by mouth daily.  .  Multiple Vitamins-Minerals (PRESERVISION AREDS 2) CAPS Take 1 capsule by mouth 2 (two) times daily.  . nitroGLYCERIN (NITROSTAT) 0.4 MG SL tablet Place 0.4 mg under the tongue every 5 (five) minutes as needed for chest pain. Give up to 3 times. If no relief call NP/MD.  . omeprazole (PRILOSEC) 20 MG capsule Take 20 mg by mouth daily.  . polyethylene glycol (MIRALAX / GLYCOLAX) packet Take 17 g by mouth every other day.   . protein supplement (RESOURCE BENEPROTEIN) POWD Take 1 scoop by mouth 2 (two) times daily.   Marland Kitchen pyrithione zinc (HEAD AND SHOULDERS) 1 % shampoo Apply 1 application topically 2 (two) times a week. Tues and Fri  . sennosides-docusate sodium (SENOKOT-S) 8.6-50 MG tablet Take 2 tablets by mouth at bedtime.    No facility-administered encounter medications on file as of 03/08/2018.    ROS was provided with assistance of staff Review of Systems  Constitutional: Negative for activity  change, appetite change, chills, diaphoresis, fatigue, fever and unexpected weight change.  HENT: Positive for hearing loss. Negative for congestion.   Respiratory: Negative for cough, shortness of breath and wheezing.   Cardiovascular: Negative for chest pain, palpitations and leg swelling.  Gastrointestinal: Negative for constipation, diarrhea, nausea and vomiting.  Genitourinary: Negative for difficulty urinating, dysuria and urgency.  Musculoskeletal: Positive for arthralgias and gait problem.  Skin: Negative for color change and pallor.  Neurological: Negative for dizziness, speech difficulty and headaches.       Dementia  Psychiatric/Behavioral: Positive for confusion. Negative for agitation, behavioral problems, hallucinations and sleep disturbance. The patient is not nervous/anxious.     Immunization History  Administered Date(s) Administered  . Influenza Whole 01/14/2018  . Influenza, High Dose Seasonal PF 12/27/2013  . Influenza-Unspecified 01/12/2013, 01/02/2015, 01/29/2016, 02/03/2017    . Pneumococcal-Unspecified 07/01/2012  . Td 05/27/2004  . Tdap 02/21/2012  . Zoster 04/15/2007   Pertinent  Health Maintenance Due  Topic Date Due  . PNA vac Low Risk Adult (2 of 2 - PCV13) 07/01/2013  . INFLUENZA VACCINE  Completed  . DEXA SCAN  Completed   Fall Risk  12/01/2017 11/28/2016 05/22/2014 10/06/2013 06/02/2013  Falls in the past year? No No No No No  Risk for fall due to : - - - History of fall(s);Impaired balance/gait;Impaired mobility Impaired mobility   Functional Status Survey:    Vitals:   03/08/18 1227  BP: 140/68  Pulse: 70  Resp: 20  Temp: 98.3 F (36.8 C)  SpO2: 98%  Weight: 136 lb 6.4 oz (61.9 kg)  Height: 5\' 4"  (1.626 m)   Body mass index is 23.41 kg/m. Physical Exam  Constitutional: She appears well-developed and well-nourished.  HENT:  Head: Normocephalic and atraumatic.  Eyes: Pupils are equal, round, and reactive to light. EOM are normal.  Neck: Normal range of motion. Neck supple. No JVD present. No thyromegaly present.  Cardiovascular: Normal rate and regular rhythm.  No murmur heard. Pulmonary/Chest: She has no wheezes. She has no rales.  Abdominal: Soft. She exhibits no distension. There is no tenderness. There is no rebound and no guarding.  Musculoskeletal: She exhibits no edema.  Ambulates with walker.   Neurological: She is alert. No cranial nerve deficit. She exhibits normal muscle tone. Coordination normal.  Oriented to person and her room on unit.   Skin: Skin is warm and dry.  Psychiatric: She has a normal mood and affect. Her behavior is normal.    Labs reviewed: Recent Labs    03/31/17 06/18/17 08/11/17 01/19/18  NA 141 142 141 141  K 4.1 3.2* 3.4 3.9  CL  --   --  107 105  CO2  --   --  25 27  BUN 14 23* 18 25*  CREATININE 1.01 0.7 0.8 0.8  CALCIUM 9.2  --  8.6 9.3   Recent Labs    03/31/17 01/19/18  AST 17 15  ALT 17 9  ALKPHOS 79 76  PROT 6.5 6.0  ALBUMIN 3.6 3.8   Recent Labs    03/31/17 08/04/17 01/19/18   WBC  --  9.4 9.5  NEUTROABS  --  6,439  --   HGB  --  12.4 13.7  HCT 44 37 40  PLT 133 250 275   Lab Results  Component Value Date   TSH 0.98 11/19/2017   Lab Results  Component Value Date   HGBA1C 5.8 (H) 07/06/2014   Lab Results  Component Value Date  CHOL 228 (A) 07/15/2016   HDL 55 07/15/2016   LDLCALC 146 07/15/2016   TRIG 141 07/15/2016   CHOLHDL 3.2 07/06/2014    Significant Diagnostic Results in last 30 days:  No results found.  Assessment/Plan Essential hypertension Blood pressure is controlled, continue Lisinopril 5mg  qd, Labetalol 100mg  bid.   GERD Stable, continue Omeprazole 20mg  qd.   Hypothyroidism Stable, last TSH 0.98 11/19/17, continue Levothyroxine 170mcg qd.   Dementia of the Alzheimer's type Continue SNF [facility] for safety and care assistance, continue Memantine 28mg  qd.   Constipation Stable, continue Senokot S II qhs, MiraLax qod     Family/ staff Communication: plan of care reviewed with the patient and charge nurse.   Labs/tests ordered:  none  Time spend 25 minutes.

## 2018-03-08 NOTE — Assessment & Plan Note (Signed)
Stable, continue Omeprazole 20mg qd.  

## 2018-03-08 NOTE — Assessment & Plan Note (Signed)
Blood pressure is controlled, continue Lisinopril 5mg  qd, Labetalol 100mg  bid.

## 2018-03-08 NOTE — Assessment & Plan Note (Signed)
Stable, last TSH 0.98 11/19/17, continue Levothyroxine 160mcg qd.

## 2018-03-08 NOTE — Assessment & Plan Note (Signed)
Stable, continue  Senokot S II qhs, MiraLax qod.  

## 2018-04-05 ENCOUNTER — Encounter: Payer: Self-pay | Admitting: Nurse Practitioner

## 2018-04-05 ENCOUNTER — Non-Acute Institutional Stay (SKILLED_NURSING_FACILITY): Payer: Medicare Other | Admitting: Nurse Practitioner

## 2018-04-05 DIAGNOSIS — E039 Hypothyroidism, unspecified: Secondary | ICD-10-CM | POA: Diagnosis not present

## 2018-04-05 DIAGNOSIS — K5900 Constipation, unspecified: Secondary | ICD-10-CM

## 2018-04-05 DIAGNOSIS — F0280 Dementia in other diseases classified elsewhere without behavioral disturbance: Secondary | ICD-10-CM | POA: Diagnosis not present

## 2018-04-05 DIAGNOSIS — G301 Alzheimer's disease with late onset: Secondary | ICD-10-CM

## 2018-04-05 DIAGNOSIS — I10 Essential (primary) hypertension: Secondary | ICD-10-CM

## 2018-04-05 NOTE — Assessment & Plan Note (Signed)
Controlled, last TSH wnl 0.98 11/19/17, continue Levothyroxine 177mcg qd.

## 2018-04-05 NOTE — Progress Notes (Signed)
Location:  [place] Room Number: 76 Place of Service:  SNF (31) Provider:    Mast, Man X, NP  Patient Care Team: Mast, Man X, NP as PCP - General (Internal Medicine) Rankin, Clent Demark, MD as Consulting Physician (Ophthalmology) Burnell Blanks, MD as Consulting Physician (Cardiology) Inda Castle, MD (Inactive) as Consulting Physician (Gastroenterology) Jari Pigg, MD as Consulting Physician (Dermatology) Dorna Leitz, MD as Consulting Physician (Orthopedic Surgery) Mast, Man X, NP as Nurse Practitioner (Internal Medicine)  Extended Emergency Contact Information Primary Emergency Contact: [facility] Address: [address]          [place], [place] [address] Johnnette Litter of [place] Phone: [phone] Relation: Daughter Secondary Emergency Contact: Mal[place], [place], [place] [address] Johnnette Litter of [place] Phone: [phone] Relation: Son  Code Status:  Goals of care: Advanced Directive information Advanced Directives 03/08/2018  Does Patient Have a Medical Advance Directive? Yes  Type of Advance Directive Out of facility DNR (pink MOST or yellow form)  Does patient want to make changes to medical advance directive? No - Patient declined  Copy of [place] in Chart? -  Pre-existing out of facility DNR order (yellow form or pink MOST form) Yellow form placed in chart (order not valid for inpatient use)     Chief Complaint  Patient presents with  . Medical Management of Chronic Issues    HPI:  Pt is a 84 y.o. female seen today for medical management of chronic diseases.     Past Medical History:  Diagnosis Date  . Acute blood loss anemia 03/29/2014   03/30/14 Hgb 7.3 04/04/14 Hgb 8.5 continue Fe bid.  05/04/14 Hgb 10.8   . Benign neoplasm of colon   . Cancer ([place])    pre-melanoma  . Cystocele   . Dementia of the Alzheimer's type [facility]) 05/22/2014   05/19/14 MMSE 18/30 [place].    . Diastolic  dysfunction 0/53/9767  . Diverticulosis of colon (without mention of hemorrhage)   . Diverticulosis of large intestine 06/28/2004  . Esophageal reflux   . Essential hypertension 07/05/2013  . Gastroparesis   . Hemorrhoids   . HTN (hypertension)   . Hyperlipidemia 07/05/2013  . Hypertension   . Hypothyroidism   . Internal hemorrhoids without mention of complication 34/04/9377  . Macular degeneration   . Nonspecific abnormal electrocardiogram (ECG) (EKG)   . Osteoarthritis    right hip  . Other and unspecified hyperlipidemia   . Prediabetes 11/17/2013  . Primary osteoarthritis of right hip 03/27/2014  . Shingles   . Skin cancer (melanoma) ([place])   . Stricture and stenosis of esophagus   . Stricture and stenosis of esophagus 10/27/2007  . Vitamin D deficiency 11/17/2013   Past Surgical History:  Procedure Laterality Date  . APPENDECTOMY    . BCC resection     legs/face  . CATARACT EXTRACTION, BILATERAL    . CHOLECYSTECTOMY    . FLEXIBLE SIGMOIDOSCOPY  05/12/2011   Procedure: FLEXIBLE SIGMOIDOSCOPY;  Surgeon: Inda Castle, MD;  [facility];  Service: Endoscopy;  Laterality: N/A;  . FLEXIBLE SIGMOIDOSCOPY  09/10/2011   Procedure: FLEXIBLE SIGMOIDOSCOPY;  Surgeon: Inda Castle, MD;  [facility];  Service: Endoscopy;  Laterality: N/A;  . FLEXIBLE SIGMOIDOSCOPY N/A 08/16/2012   Procedure: FLEXIBLE SIGMOIDOSCOPY;  Surgeon: Inda Castle, MD;  [facility];  Service: Endoscopy;  Laterality: N/A;  . FRACTURE SURGERY  left hip pin  . GALLBLADDER SURGERY  2002  . HEMORRHOID BANDING N/A 08/16/2012   Procedure: HEMORRHOID BANDING;  Surgeon: Inda Castle, MD;  [facility];  Service: Endoscopy;  Laterality: N/A;  . HEMORRHOID SURGERY    . left eye surgery    . melenoma resection     right arm  . THYROIDECTOMY    . TONSILLECTOMY    . TOTAL HIP ARTHROPLASTY Right 03/27/2014   Procedure: TOTAL HIP ARTHROPLASTY ANTERIOR APPROACH;  Surgeon: Alta Corning, MD;  Location: [place];  Service: Orthopedics;  Laterality: Right;    Allergies  Allergen Reactions  . Ace Inhibitors Other (See Comments)    Elevated potassium  . Statins Other (See Comments)    Myalgias   . Vicodin [Hydrocodone-Acetaminophen] Other (See Comments)    "felt spaced out"  . Fosamax [Alendronate Sodium] Other (See Comments)  . Metoclopramide Hcl Other (See Comments)  . Sulfa Antibiotics Rash  . Sulfonamide Derivatives Rash  . Vesicare [Solifenacin] Other (See Comments)    Outpatient Encounter Medications as of 04/05/2018  Medication Sig  . acetaminophen (TYLENOL) 500 MG tablet Take 500 mg by mouth every 8 (eight) hours as needed for mild pain or moderate pain.   Marland Kitchen aspirin EC 81 MG tablet Take 81 mg by mouth daily.  . calcium carbonate (CAL-GEST ANTACID) 500 MG chewable tablet Chew 1 tablet by mouth daily as needed for indigestion or heartburn.  . carboxymethylcellulose (REFRESH PLUS) 0.5 % SOLN Place 1 drop into both eyes as needed. For red or dry eyes.  . Cholecalciferol (VITAMIN D) 2000 UNITS tablet Take 4,000 Units by mouth daily.  Marland Kitchen guaiFENesin (TUSSIN) 100 MG/5ML liquid Take by mouth every 6 (six) hours as needed for cough (10 mL).  Marland Kitchen labetalol (NORMODYNE) 100 MG tablet Take 100 mg by mouth 2 (two) times daily.  Marland Kitchen levothyroxine (SYNTHROID, LEVOTHROID) 100 MCG tablet Take 100 mcg by mouth daily before breakfast.  . lisinopril (PRINIVIL,ZESTRIL) 5 MG tablet Take 5 mg by mouth daily.  . memantine (NAMENDA XR) 28 MG CP24 24 hr capsule Take 28 mg by mouth daily.  . Multiple Vitamins-Minerals (PRESERVISION AREDS 2) CAPS Take 1 capsule by mouth 2 (two) times daily.  . nitroGLYCERIN (NITROSTAT) 0.4 MG SL tablet Place 0.4 mg under the tongue every 5 (five) minutes as needed for chest pain. Give up to 3 times. If no relief call NP/MD.  . omeprazole (PRILOSEC) 20 MG capsule Take 20 mg by mouth daily.  . polyethylene glycol (MIRALAX / GLYCOLAX) packet Take 17 g by mouth  every other day.   . protein supplement (RESOURCE BENEPROTEIN) POWD Take 1 scoop by mouth 2 (two) times daily.   Marland Kitchen pyrithione zinc (HEAD AND SHOULDERS) 1 % shampoo Apply 1 application topically 2 (two) times a week. Tues and Fri  . sennosides-docusate sodium (SENOKOT-S) 8.6-50 MG tablet Take 2 tablets by mouth at bedtime.    No facility-administered encounter medications on file as of 04/05/2018.     Review of Systems  Immunization History  Administered Date(s) Administered  . Influenza Whole 01/14/2018  . Influenza, High Dose Seasonal PF 12/27/2013  . Influenza-Unspecified 01/12/2013, 01/02/2015, 01/29/2016, 02/03/2017  . Pneumococcal-Unspecified 07/01/2012  . Td 05/27/2004  . Tdap 02/21/2012  . Zoster 04/15/2007   Pertinent  Health Maintenance Due  Topic Date Due  . PNA vac Low Risk Adult (2 of 2 - PCV13) 07/01/2013  . INFLUENZA VACCINE  Completed  . DEXA SCAN  Completed  Fall Risk  12/01/2017 11/28/2016 05/22/2014 10/06/2013 06/02/2013  Falls in the past year? No No No No No  Risk for fall due to : - - - History of fall(s);Impaired balance/gait;Impaired mobility Impaired mobility   Functional Status Survey:    There were no vitals filed for this visit. There is no height or weight on file to calculate BMI. Physical Exam  Labs reviewed: Recent Labs    06/18/17 08/11/17 01/19/18  NA 142 141 141  K 3.2* 3.4 3.9  CL  --  107 105  CO2  --  25 27  BUN 23* 18 25*  CREATININE 0.7 0.8 0.8  CALCIUM  --  8.6 9.3   Recent Labs    01/19/18  AST 15  ALT 9  ALKPHOS 76  PROT 6.0  ALBUMIN 3.8   Recent Labs    08/04/17 01/19/18  WBC 9.4 9.5  NEUTROABS 6,439  --   HGB 12.4 13.7  HCT 37 40  PLT 250 275   Lab Results  Component Value Date   TSH 0.98 11/19/2017   Lab Results  Component Value Date   HGBA1C 5.8 (H) 07/06/2014   Lab Results  Component Value Date   CHOL 228 (A) 07/15/2016   HDL 55 07/15/2016   LDLCALC 146 07/15/2016   TRIG 141 07/15/2016   CHOLHDL  3.2 07/06/2014    Significant Diagnostic Results in last 30 days:  No results found.  Assessment/Plan There are no diagnoses linked to this encounter.   Family/ staff Communication:   Labs/tests ordered:    This encounter was created in error - please disregard.

## 2018-04-05 NOTE — Progress Notes (Signed)
Location:  [facility] Room Number: 49 Place of Service:  SNF (31) Provider: Marlana Latus  NP  Hazleigh Mccleave X, NP  Patient Care Team: Azarya Oconnell X, NP as PCP - General (Internal Medicine) Rankin, Clent Demark, MD as Consulting Physician (Ophthalmology) Burnell Blanks, MD as Consulting Physician (Cardiology) Inda Castle, MD (Inactive) as Consulting Physician (Gastroenterology) Jari Pigg, MD as Consulting Physician (Dermatology) Dorna Leitz, MD as Consulting Physician (Orthopedic Surgery) Stefon Ramthun X, NP as Nurse Practitioner (Internal Medicine)  Extended Emergency Contact Information Primary Emergency Contact: [facility] Address: [address]          [place], [place] [address] Johnnette Litter of [place] Phone: [phone] Relation: Daughter Secondary Emergency Contact: [place], [place], [place] [address] Johnnette Litter of [place] Phone: [phone] Relation: Son  Code Status:  DNR Goals of care: Advanced Directive information Advanced Directives 04/05/2018  Does Patient Have a Medical Advance Directive? Yes  Type of Advance Directive Out of facility DNR (pink MOST or yellow form)  Does patient want to make changes to medical advance directive? No - Patient declined  Copy of [place] in Chart? -  Pre-existing out of facility DNR order (yellow form or pink MOST form) Yellow form placed in chart (order not valid for inpatient use)     Chief Complaint  Patient presents with  . Medical Management of Chronic Issues    HPI:  Pt is a 84 y.o. female seen today for medical management of chronic diseases.     The patient has history of HTN, blood pressure is controlled on Lisinopril 5mg  qd, Labetalol 100mg  bid. No constipation while on Senokot S II qhs, MiraLax qod. Her memory is preserved on Namentine 28mg  qd, she resides in [facility] for safety and care assistance. HTN, on Levothyroxine 137mcg qd, last TSH 0.98  11/19/17.  Past Medical History:  Diagnosis Date  . Acute blood loss anemia 03/29/2014   03/30/14 Hgb 7.3 04/04/14 Hgb 8.5 continue Fe bid.  05/04/14 Hgb 10.8   . Benign neoplasm of colon   . Cancer ([place])    pre-melanoma  . Cystocele   . Dementia of the Alzheimer's type [facility]) 05/22/2014   05/19/14 MMSE 18/30 Namenda.    . Diastolic dysfunction 8/41/6606  . Diverticulosis of colon (without mention of hemorrhage)   . Diverticulosis of large intestine 06/28/2004  . Esophageal reflux   . Essential hypertension 07/05/2013  . Gastroparesis   . Hemorrhoids   . HTN (hypertension)   . Hyperlipidemia 07/05/2013  . Hypertension   . Hypothyroidism   . Internal hemorrhoids without mention of complication 30/04/6008  . Macular degeneration   . Nonspecific abnormal electrocardiogram (ECG) (EKG)   . Osteoarthritis    right hip  . Other and unspecified hyperlipidemia   . Prediabetes 11/17/2013  . Primary osteoarthritis of right hip 03/27/2014  . Shingles   . Skin cancer (melanoma) ([place])   . Stricture and stenosis of esophagus   . Stricture and stenosis of esophagus 10/27/2007  . Vitamin D deficiency 11/17/2013   Past Surgical History:  Procedure Laterality Date  . APPENDECTOMY    . BCC resection     legs/face  . CATARACT EXTRACTION, BILATERAL    . CHOLECYSTECTOMY    . FLEXIBLE SIGMOIDOSCOPY  05/12/2011   Procedure: FLEXIBLE SIGMOIDOSCOPY;  Surgeon: Inda Castle, MD;  [facility]: [facility]OS[facility];  Service: Endoscopy;  Laterality: N/A;  . FLEXIBLE  SIGMOIDOSCOPY  09/10/2011   Procedure: FLEXIBLE SIGMOIDOSCOPY;  Surgeon: Inda Castle, MD;  Location: [facility];  Service: Endoscopy;  Laterality: N/A;  . FLEXIBLE SIGMOIDOSCOPY N/A 08/16/2012   Procedure: FLEXIBLE SIGMOIDOSCOPY;  Surgeon: Inda Castle, MD;  [facility];  Service: Endoscopy;  Laterality: N/A;  . FRACTURE SURGERY     left hip pin  . GALLBLADDER SURGERY  2002  . HEMORRHOID BANDING N/A 08/16/2012   Procedure: HEMORRHOID BANDING;   Surgeon: Inda Castle, MD;  [facility];  Service: Endoscopy;  Laterality: N/A;  . HEMORRHOID SURGERY    . left eye surgery    . melenoma resection     right arm  . THYROIDECTOMY    . TONSILLECTOMY    . TOTAL HIP ARTHROPLASTY Right 03/27/2014   Procedure: TOTAL HIP ARTHROPLASTY ANTERIOR APPROACH;  Surgeon: Alta Corning, MD;  Location: [facility];  Service: Orthopedics;  Laterality: Right;    Allergies  Allergen Reactions  . Ace Inhibitors Other (See Comments)    Elevated potassium  . Statins Other (See Comments)    Myalgias   . Vicodin [Hydrocodone-Acetaminophen] Other (See Comments)    "felt spaced out"  . Fosamax [Alendronate Sodium] Other (See Comments)  . Metoclopramide Hcl Other (See Comments)  . Sulfa Antibiotics Rash  . Sulfonamide Derivatives Rash  . Vesicare [Solifenacin] Other (See Comments)    Outpatient Encounter Medications as of 04/05/2018  Medication Sig  . acetaminophen (TYLENOL) 500 MG tablet Take 500 mg by mouth every 8 (eight) hours as needed for mild pain or moderate pain.   Marland Kitchen aspirin EC 81 MG tablet Take 81 mg by mouth daily.  . calcium carbonate (CAL-GEST ANTACID) 500 MG chewable tablet Chew 1 tablet by mouth daily as needed for indigestion or heartburn.  . carboxymethylcellulose (REFRESH PLUS) 0.5 % SOLN Place 1 drop into both eyes as needed. For red or dry eyes.  . carboxymethylcellulose (REFRESH TEARS) 0.5 % SOLN Place 1 drop into both eyes 2 (two) times daily.  . Cholecalciferol (VITAMIN D) 2000 UNITS tablet Take 4,000 Units by mouth daily.  Marland Kitchen guaiFENesin (TUSSIN) 100 MG/5ML liquid Take by mouth every 6 (six) hours as needed for cough (10 mL).  Marland Kitchen labetalol (NORMODYNE) 100 MG tablet Take 100 mg by mouth 2 (two) times daily.  Marland Kitchen levothyroxine (SYNTHROID, LEVOTHROID) 100 MCG tablet Take 100 mcg by mouth daily before breakfast.  . lisinopril (PRINIVIL,ZESTRIL) 5 MG tablet Take 5 mg by mouth daily.  . memantine (NAMENDA XR) 28 MG CP24 24 hr capsule  Take 28 mg by mouth daily.  . Multiple Vitamins-Minerals (PRESERVISION AREDS 2) CAPS Take 1 capsule by mouth 2 (two) times daily.  . nitroGLYCERIN (NITROSTAT) 0.4 MG SL tablet Place 0.4 mg under the tongue every 5 (five) minutes as needed for chest pain. Give up to 3 times. If no relief call NP/MD.  . omeprazole (PRILOSEC) 20 MG capsule Take 20 mg by mouth daily.  . polyethylene glycol (MIRALAX / GLYCOLAX) packet Take 17 g by mouth every other day.   . protein supplement (RESOURCE BENEPROTEIN) POWD Take 1 scoop by mouth 2 (two) times daily.   Marland Kitchen pyrithione zinc (HEAD AND SHOULDERS) 1 % shampoo Apply 1 application topically 2 (two) times a week. Tues and Fri  . sennosides-docusate sodium (SENOKOT-S) 8.6-50 MG tablet Take 2 tablets by mouth at bedtime.    No facility-administered encounter medications on file as of 04/05/2018.    ROS was provided with assistance of staff Review  of Systems  Constitutional: Negative for activity change, appetite change, chills, diaphoresis, fatigue, fever and unexpected weight change.  HENT: Positive for hearing loss. Negative for congestion and voice change.   Respiratory: Negative for cough, shortness of breath and wheezing.   Cardiovascular: Negative for chest pain, palpitations and leg swelling.  Gastrointestinal: Negative for abdominal distention, abdominal pain, constipation, diarrhea, nausea and vomiting.  Genitourinary: Negative for difficulty urinating, dysuria and urgency.  Musculoskeletal: Positive for arthralgias and gait problem.  Skin: Negative for color change and pallor.  Neurological: Negative for dizziness, speech difficulty, weakness and headaches.       Dementia  Psychiatric/Behavioral: Positive for confusion. Negative for agitation, behavioral problems, hallucinations and sleep disturbance. The patient is not nervous/anxious.     Immunization History  Administered Date(s) Administered  . Influenza Whole 01/14/2018  . Influenza, High Dose  Seasonal PF 12/27/2013  . Influenza-Unspecified 01/12/2013, 01/02/2015, 01/29/2016, 02/03/2017  . Pneumococcal-Unspecified 07/01/2012  . Td 05/27/2004  . Tdap 02/21/2012  . Zoster 04/15/2007   Pertinent  Health Maintenance Due  Topic Date Due  . PNA vac Low Risk Adult (2 of 2 - PCV13) 07/01/2013  . INFLUENZA VACCINE  Completed  . DEXA SCAN  Completed   Fall Risk  12/01/2017 11/28/2016 05/22/2014 10/06/2013 06/02/2013  Falls in the past year? No No No No No  Risk for fall due to : - - - History of fall(s);Impaired balance/gait;Impaired mobility Impaired mobility   Functional Status Survey:    Vitals:   04/05/18 1105  BP: 140/70  Pulse: 64  Resp: 18  Temp: 98 F (36.7 C)  SpO2: 98%  Weight: 135 lb 14.4 oz (61.6 kg)  Height: 5\' 1"  (1.549 m)   Body mass index is 25.68 kg/m. Physical Exam Constitutional:      General: She is not in acute distress.    Appearance: Normal appearance. She is normal weight. She is not diaphoretic.  HENT:     Head: Normocephalic and atraumatic.     Nose: Nose normal.     Mouth/Throat:     Mouth: Mucous membranes are moist.  Eyes:     Extraocular Movements: Extraocular movements intact.     Pupils: Pupils are equal, round, and reactive to light.  Neck:     Musculoskeletal: Normal range of motion and neck supple.  Cardiovascular:     Rate and Rhythm: Normal rate and regular rhythm.     Heart sounds: No murmur.  Pulmonary:     Effort: Pulmonary effort is normal.     Breath sounds: Normal breath sounds. No wheezing or rhonchi.  Abdominal:     General: There is no distension.     Palpations: Abdomen is soft.     Tenderness: There is no abdominal tenderness. There is no guarding or rebound.  Musculoskeletal: Normal range of motion.     Comments: Ambulates with walker.   Skin:    General: Skin is warm and dry.  Neurological:     General: No focal deficit present.     Mental Status: She is alert. Mental status is at baseline.     Motor: No  weakness.     Coordination: Coordination normal.     Gait: Gait abnormal.     Comments: Oriented to person and her room on unit  Psychiatric:        Mood and Affect: Mood normal.        Behavior: Behavior normal.     Labs reviewed: Recent Labs  06/18/17 08/11/17 01/19/18  NA 142 141 141  K 3.2* 3.4 3.9  CL  --  107 105  CO2  --  25 27  BUN 23* 18 25*  CREATININE 0.7 0.8 0.8  CALCIUM  --  8.6 9.3   Recent Labs    01/19/18  AST 15  ALT 9  ALKPHOS 76  PROT 6.0  ALBUMIN 3.8   Recent Labs    08/04/17 01/19/18  WBC 9.4 9.5  NEUTROABS 6,439  --   HGB 12.4 13.7  HCT 37 40  PLT 250 275   Lab Results  Component Value Date   TSH 0.98 11/19/2017   Lab Results  Component Value Date   HGBA1C 5.8 (H) 07/06/2014   Lab Results  Component Value Date   CHOL 228 (A) 07/15/2016   HDL 55 07/15/2016   LDLCALC 146 07/15/2016   TRIG 141 07/15/2016   CHOLHDL 3.2 07/06/2014    Significant Diagnostic Results in last 30 days:  No results found.  Assessment/Plan Essential hypertension Blood pressure is controlled, continue Labetalol 100mg  bid, Lisinopril 5mg  qd.   Hypothyroidism Controlled, last TSH wnl 0.98 11/19/17, continue Levothyroxine 136mcg qd.   Dementia of the Alzheimer's type Continue SNF [facility] for safety and care assistance, continue Memantine 28mg  qd for memory.   Constipation Stable, continue Senokot S II qd, MiraLax qod.      Family/ staff Communication: plan of care reviewed with the patient and charge nurse  Labs/tests ordered:  none  Time spend 25 minutes.

## 2018-04-05 NOTE — Assessment & Plan Note (Signed)
Blood pressure is controlled, continue Labetalol 100mg  bid, Lisinopril 5mg  qd.

## 2018-04-05 NOTE — Assessment & Plan Note (Signed)
Stable, continue Senokot S II qd, MiraLax qod.

## 2018-04-05 NOTE — Assessment & Plan Note (Signed)
Continue SNF [facility] for safety and care assistance, continue Memantine 28mg qd for memory.  

## 2018-05-18 ENCOUNTER — Non-Acute Institutional Stay (SKILLED_NURSING_FACILITY): Payer: Medicare Other | Admitting: Internal Medicine

## 2018-05-18 ENCOUNTER — Encounter: Payer: Self-pay | Admitting: Internal Medicine

## 2018-05-18 DIAGNOSIS — F0280 Dementia in other diseases classified elsewhere without behavioral disturbance: Secondary | ICD-10-CM | POA: Diagnosis not present

## 2018-05-18 DIAGNOSIS — K219 Gastro-esophageal reflux disease without esophagitis: Secondary | ICD-10-CM | POA: Diagnosis not present

## 2018-05-18 DIAGNOSIS — G301 Alzheimer's disease with late onset: Secondary | ICD-10-CM | POA: Diagnosis not present

## 2018-05-18 DIAGNOSIS — E039 Hypothyroidism, unspecified: Secondary | ICD-10-CM | POA: Diagnosis not present

## 2018-05-18 DIAGNOSIS — I5032 Chronic diastolic (congestive) heart failure: Secondary | ICD-10-CM | POA: Diagnosis not present

## 2018-05-18 DIAGNOSIS — I10 Essential (primary) hypertension: Secondary | ICD-10-CM | POA: Diagnosis not present

## 2018-05-18 NOTE — Progress Notes (Signed)
Location:  [facility] Room Number: 43 Place of Service:  SNF (31) Provider:    Mast, Man X, NP  Patient Care Team: Mast, Man X, NP as PCP - General (Internal Medicine) Rankin, Clent Demark, MD as Consulting Physician (Ophthalmology) Burnell Blanks, MD as Consulting Physician (Cardiology) Inda Castle, MD (Inactive) as Consulting Physician (Gastroenterology) Jari Pigg, MD as Consulting Physician (Dermatology) Dorna Leitz, MD as Consulting Physician (Orthopedic Surgery) Mast, Man X, NP as Nurse Practitioner (Internal Medicine)  Extended Emergency Contact Information Primary Emergency Contact: [facility] Address: [address]          [place], [place] [address] Johnnette Litter of [place] Phone: [phone] Relation: Daughter Secondary Emergency Contact: [place], [place], [place] [address] Johnnette Litter of [place] Phone: [phone] Relation: Son  Code Status:  DNR Goals of care: Advanced Directive information Advanced Directives 05/18/2018  Does Patient Have a Medical Advance Directive? Yes  Type of Advance Directive Out of facility DNR (pink MOST or yellow form)  Does patient want to make changes to medical advance directive? No - Patient declined  Copy of [place] in Chart? -  Pre-existing out of facility DNR order (yellow form or pink MOST form) Yellow form placed in chart (order not valid for inpatient use)     Chief Complaint  Patient presents with  . Medical Management of Chronic Issues    Routine visit     HPI:  Pt is a 85 y.o. female seen today for medical management of chronic diseases.    Patient has h/o Hypertension,Hypotyroidism and Dementia and gastritis. Patient is in SNF and has been stable. D/W the nurses and she did not have any concerns. Patient did not give me any history. She Walks with the walker. Feed herself. Dependent for her other ADLS. Her weight is stable    Past Medical  History:  Diagnosis Date  . Acute blood loss anemia 03/29/2014   03/30/14 Hgb 7.3 04/04/14 Hgb 8.5 continue Fe bid.  05/04/14 Hgb 10.8   . Benign neoplasm of colon   . Cancer ([place])    pre-melanoma  . Cystocele   . Dementia of the Alzheimer's type [facility]) 05/22/2014   05/19/14 MMSE 18/30 [place].    . Diastolic dysfunction 6/96/7893  . Diverticulosis of colon (without mention of hemorrhage)   . Diverticulosis of large intestine 06/28/2004  . Esophageal reflux   . Essential hypertension 07/05/2013  . Gastroparesis   . Hemorrhoids   . HTN (hypertension)   . Hyperlipidemia 07/05/2013  . Hypertension   . Hypothyroidism   . Internal hemorrhoids without mention of complication 81/0/1751  . Macular degeneration   . Nonspecific abnormal electrocardiogram (ECG) (EKG)   . Osteoarthritis    right hip  . Other and unspecified hyperlipidemia   . Prediabetes 11/17/2013  . Primary osteoarthritis of right hip 03/27/2014  . Shingles   . Skin cancer (melanoma) ([name])   . Stricture and stenosis of esophagus   . Stricture and stenosis of esophagus 10/27/2007  . Vitamin D deficiency 11/17/2013   Past Surgical History:  Procedure Laterality Date  . APPENDECTOMY    . BCC resection     legs/face  . CATARACT EXTRACTION, BILATERAL    . CHOLECYSTECTOMY    . FLEXIBLE SIGMOIDOSCOPY  05/12/2011   Procedure: FLEXIBLE SIGMOIDOSCOPY;  Surgeon: Inda Castle, MD;  [facility];  Service: Endoscopy;  Laterality: N/A;  . FLEXIBLE  SIGMOIDOSCOPY  09/10/2011   Procedure: FLEXIBLE SIGMOIDOSCOPY;  Surgeon: Inda Castle, MD;  Location[facility];  Service: Endoscopy;  Laterality: N/A;  . FLEXIBLE SIGMOIDOSCOPY N/A 08/16/2012   Procedure: FLEXIBLE SIGMOIDOSCOPY;  Surgeon: Inda Castle, MD;  [facility];  Service: Endoscopy;  Laterality: N/A;  . FRACTURE SURGERY     left hip pin  . GALLBLADDER SURGERY  2002  . HEMORRHOID BANDING N/A 08/16/2012   Procedure: HEMORRHOID BANDING;  Surgeon: Inda Castle, MD;  [facility];  Service: Endoscopy;  Laterality: N/A;  . HEMORRHOID SURGERY    . left eye surgery    . melenoma resection     right arm  . THYROIDECTOMY    . TONSILLECTOMY    . TOTAL HIP ARTHROPLASTY Right 03/27/2014   Procedure: TOTAL HIP ARTHROPLASTY ANTERIOR APPROACH;  Surgeon: Alta Corning, MD;  Location: [facility];  Service: Orthopedics;  Laterality: Right;    Allergies  Allergen Reactions  . Ace Inhibitors Other (See Comments)    Elevated potassium  . Statins Other (See Comments)    Myalgias   . Vicodin [Hydrocodone-Acetaminophen] Other (See Comments)    "felt spaced out"  . Fosamax [Alendronate Sodium] Other (See Comments)  . Metoclopramide Hcl Other (See Comments)  . Sulfa Antibiotics Rash  . Sulfonamide Derivatives Rash  . Vesicare [Solifenacin] Other (See Comments)    Outpatient Encounter Medications as of 05/18/2018  Medication Sig  . acetaminophen (TYLENOL) 500 MG tablet Take 500 mg by mouth every 8 (eight) hours as needed for mild pain or moderate pain.   Marland Kitchen aspirin EC 81 MG tablet Take 81 mg by mouth daily.  . calcium carbonate (CAL-GEST ANTACID) 500 MG chewable tablet Chew 1 tablet by mouth daily as needed for indigestion or heartburn.  . carboxymethylcellulose (REFRESH PLUS) 0.5 % SOLN Place 1 drop into both eyes as needed. For red or dry eyes.  . carboxymethylcellulose (REFRESH TEARS) 0.5 % SOLN Place 1 drop into both eyes 2 (two) times daily.  . Cholecalciferol (VITAMIN D) 2000 UNITS tablet Take 4,000 Units by mouth daily.  Marland Kitchen guaiFENesin (TUSSIN) 100 MG/5ML liquid Take by mouth every 6 (six) hours as needed for cough (10 mL).  Marland Kitchen labetalol (NORMODYNE) 100 MG tablet Take 100 mg by mouth 2 (two) times daily.  Marland Kitchen levothyroxine (SYNTHROID, LEVOTHROID) 100 MCG tablet Take 100 mcg by mouth daily before breakfast.  . lisinopril (PRINIVIL,ZESTRIL) 5 MG tablet Take 5 mg by mouth daily.  . memantine (NAMENDA XR) 28 MG CP24 24 hr capsule Take 28 mg by mouth  daily.  . Multiple Vitamins-Minerals (PRESERVISION AREDS 2) CAPS Take 1 capsule by mouth 2 (two) times daily.  . nitroGLYCERIN (NITROSTAT) 0.4 MG SL tablet Place 0.4 mg under the tongue every 5 (five) minutes as needed for chest pain. Give up to 3 times. If no relief call NP/MD.  . omeprazole (PRILOSEC) 20 MG capsule Take 20 mg by mouth daily.  . polyethylene glycol (MIRALAX / GLYCOLAX) packet Take 17 g by mouth every other day.   . protein supplement (RESOURCE BENEPROTEIN) POWD Take 1 scoop by mouth 2 (two) times daily.   Marland Kitchen pyrithione zinc (HEAD AND SHOULDERS) 1 % shampoo Apply 1 application topically 2 (two) times a week. Tues and Fri  . sennosides-docusate sodium (SENOKOT-S) 8.6-50 MG tablet Take 2 tablets by mouth at bedtime.    No facility-administered encounter medications on file as of 05/18/2018.     Review of Systems  Unable to perform  ROS: Dementia    Immunization History  Administered Date(s) Administered  . Influenza Whole 01/14/2018  . Influenza, High Dose Seasonal PF 12/27/2013  . Influenza-Unspecified 01/12/2013, 01/02/2015, 01/29/2016, 02/03/2017  . Pneumococcal-Unspecified 07/01/2012  . Td 05/27/2004  . Tdap 02/21/2012  . Zoster 04/15/2007   Pertinent  Health Maintenance Due  Topic Date Due  . PNA vac Low Risk Adult (2 of 2 - PCV13) 07/01/2013  . INFLUENZA VACCINE  Completed  . DEXA SCAN  Completed   Fall Risk  12/01/2017 11/28/2016 05/22/2014 10/06/2013 06/02/2013  Falls in the past year? No No No No No  Risk for fall due to : - - - History of fall(s);Impaired balance/gait;Impaired mobility Impaired mobility   Functional Status Survey:    Vitals:   05/18/18 1053  BP: (!) 152/74  Pulse: 74  Resp: 18  Temp: 98.3 F (36.8 C)  Weight: 133 lb 14.4 oz (60.7 kg)  Height: 5\' 1"  (1.549 m)   Body mass index is 25.3 kg/m. Physical Exam Constitutional:      Appearance: Normal appearance.  HENT:     Head: Normocephalic.     Mouth/Throat:     Pharynx: Oropharynx  is clear.  Eyes:     Pupils: Pupils are equal, round, and reactive to light.  Cardiovascular:     Rate and Rhythm: Normal rate and regular rhythm.     Pulses: Normal pulses.  Pulmonary:     Effort: Pulmonary effort is normal. No respiratory distress.     Breath sounds: Normal breath sounds. No wheezing or rales.  Abdominal:     General: Abdomen is flat. Bowel sounds are normal.     Palpations: Abdomen is soft.  Musculoskeletal:        General: No swelling.  Skin:    General: Skin is warm and dry.  Neurological:     General: No focal deficit present.     Mental Status: She is alert.     Comments: Follows simple Commands but not oriented   Psychiatric:        Mood and Affect: Mood normal.        Thought Content: Thought content normal.     Labs reviewed: Recent Labs    06/18/17 08/11/17 01/19/18  NA 142 141 141  K 3.2* 3.4 3.9  CL  --  107 105  CO2  --  25 27  BUN 23* 18 25*  CREATININE 0.7 0.8 0.8  CALCIUM  --  8.6 9.3   Recent Labs    01/19/18  AST 15  ALT 9  ALKPHOS 76  PROT 6.0  ALBUMIN 3.8   Recent Labs    08/04/17 01/19/18  WBC 9.4 9.5  NEUTROABS 6,439  --   HGB 12.4 13.7  HCT 37 40  PLT 250 275   Lab Results  Component Value Date   TSH 0.98 11/19/2017   Lab Results  Component Value Date   HGBA1C 5.8 (H) 07/06/2014   Lab Results  Component Value Date   CHOL 228 (A) 07/15/2016   HDL 55 07/15/2016   LDLCALC 146 07/15/2016   TRIG 141 07/15/2016   CHOLHDL 3.2 07/06/2014    Significant Diagnostic Results in last 30 days:  No results found.  Assessment/Plan Essential hypertension BP controlled on Labetalol and Lisinopril  Chronic diastolic heart failure  Patient doing well. Not on any diuretic  GERD On Prilosec  Hypothyroidism,  Continue on Levothyroxine Repeat TSH with Next Blood draw  Dementia On Namenda Continue supportive  care    Family/ staff Communication:   Labs/tests ordered:     Total time spent in this patient  care encounter was 25_ minutes; greater than 50% of the visit spent counseling patient, reviewing records , Labs and coordinating care for problems addressed at this encounter.

## 2018-05-31 ENCOUNTER — Encounter: Payer: Self-pay | Admitting: Nurse Practitioner

## 2018-05-31 NOTE — Progress Notes (Signed)
This encounter was created in error - please disregard.

## 2018-06-14 ENCOUNTER — Encounter: Payer: Self-pay | Admitting: Nurse Practitioner

## 2018-06-14 ENCOUNTER — Non-Acute Institutional Stay (SKILLED_NURSING_FACILITY): Payer: Medicare Other | Admitting: Nurse Practitioner

## 2018-06-14 DIAGNOSIS — K5900 Constipation, unspecified: Secondary | ICD-10-CM

## 2018-06-14 DIAGNOSIS — K219 Gastro-esophageal reflux disease without esophagitis: Secondary | ICD-10-CM | POA: Diagnosis not present

## 2018-06-14 DIAGNOSIS — F0280 Dementia in other diseases classified elsewhere without behavioral disturbance: Secondary | ICD-10-CM | POA: Diagnosis not present

## 2018-06-14 DIAGNOSIS — E039 Hypothyroidism, unspecified: Secondary | ICD-10-CM | POA: Diagnosis not present

## 2018-06-14 DIAGNOSIS — I10 Essential (primary) hypertension: Secondary | ICD-10-CM | POA: Diagnosis not present

## 2018-06-14 DIAGNOSIS — G301 Alzheimer's disease with late onset: Secondary | ICD-10-CM | POA: Diagnosis not present

## 2018-06-14 NOTE — Assessment & Plan Note (Signed)
Stable, continue Levothyroxine 156mcg qd, last TSH 0.98 11/19/17.

## 2018-06-14 NOTE — Assessment & Plan Note (Signed)
Stable, continue Omeprazole 20mg qd.  

## 2018-06-14 NOTE — Assessment & Plan Note (Signed)
blood pressure is controlled on Lisinopril 10mg  qd, Labetalol 150mg  bid.

## 2018-06-14 NOTE — Progress Notes (Signed)
Location:  [facility] Room Number: 67 Place of Service:  SNF (31) Provider:  Marlana Latus  NP  Pantelis Elgersma X, NP  Patient Care Team: Ahyana Skillin X, NP as PCP - General (Internal Medicine) Rankin, Clent Demark, MD as Consulting Physician (Ophthalmology) Burnell Blanks, MD as Consulting Physician (Cardiology) Inda Castle, MD (Inactive) as Consulting Physician (Gastroenterology) Jari Pigg, MD as Consulting Physician (Dermatology) Dorna Leitz, MD as Consulting Physician (Orthopedic Surgery) Yosiel Thieme X, NP as Nurse Practitioner (Internal Medicine)  Extended Emergency Contact Information Primary Emergency Contact: [facility] Address: [address]          [place], [place] [address] Johnnette Litter of [place] Phone: [phone] Relation: Daughter Secondary Emergency Contact: Dorna, [place], [place] [address] Johnnette Litter of [place] Phone: [phone] Relation: Son  Code Status: DNR Goals of care: Advanced Directive information Advanced Directives 06/14/2018  Does Patient Have a Medical Advance Directive? Yes  Type of Advance Directive Out of facility DNR (pink MOST or yellow form)  Does patient want to make changes to medical advance directive? No - Patient declined  Copy of [place] in Chart? -  Pre-existing out of facility DNR order (yellow form or pink MOST form) Yellow form placed in chart (order not valid for inpatient use)     Chief Complaint  Patient presents with  . Medical Management of Chronic Issues    HPI:  Pt is a 85 y.o. female seen today for medical management of chronic diseases.     The patient resides in SNF [facility] for safety and care assistance, on Memantine 28mg  qd for memory. GERD, stable on Omeprazole 20mg  qd. Constipation is managed with Senokot S II qhs, MiraLax qod. HTN, blood pressure is controlled on Lisinopril 10mg  qd, Labetalol 150mg  bid.  Hypothyroidism, on Levothyroxine 170mcg qd,  last TSH 0.98 11/19/17.   Past Medical History:  Diagnosis Date  . Acute blood loss anemia 03/29/2014   03/30/14 Hgb 7.3 04/04/14 Hgb 8.5 continue Fe bid.  05/04/14 Hgb 10.8   . Benign neoplasm of colon   . Cancer ([place])    pre-melanoma  . Cystocele   . Dementia of the Alzheimer's type [facility]) 05/22/2014   05/19/14 MMSE 18/30 Namenda.    . Diastolic dysfunction 0/93/8182  . Diverticulosis of colon (without mention of hemorrhage)   . Diverticulosis of large intestine 06/28/2004  . Esophageal reflux   . Essential hypertension 07/05/2013  . Gastroparesis   . Hemorrhoids   . HTN (hypertension)   . Hyperlipidemia 07/05/2013  . Hypertension   . Hypothyroidism   . Internal hemorrhoids without mention of complication 99/06/7167  . Macular degeneration   . Nonspecific abnormal electrocardiogram (ECG) (EKG)   . Osteoarthritis    right hip  . Other and unspecified hyperlipidemia   . Prediabetes 11/17/2013  . Primary osteoarthritis of right hip 03/27/2014  . Shingles   . Skin cancer (melanoma) ([place])   . Stricture and stenosis of esophagus   . Stricture and stenosis of esophagus 10/27/2007  . Vitamin D deficiency 11/17/2013   Past Surgical History:  Procedure Laterality Date  . APPENDECTOMY    . BCC resection     legs/face  . CATARACT EXTRACTION, BILATERAL    . CHOLECYSTECTOMY    . FLEXIBLE SIGMOIDOSCOPY  05/12/2011   Procedure: FLEXIBLE SIGMOIDOSCOPY;  Surgeon: Inda Castle, MD;  [facility];  Service: Endoscopy;  Laterality: N/A;  .  FLEXIBLE SIGMOIDOSCOPY  09/10/2011   Procedure: FLEXIBLE SIGMOIDOSCOPY;  Surgeon: Inda Castle, MD;  [facility];  Service: Endoscopy;  Laterality: N/A;  . FLEXIBLE SIGMOIDOSCOPY N/A 08/16/2012   Procedure: FLEXIBLE SIGMOIDOSCOPY;  Surgeon: Inda Castle, MD;  [facility];  Service: Endoscopy;  Laterality: N/A;  . FRACTURE SURGERY     left hip pin  . GALLBLADDER SURGERY  2002  . HEMORRHOID BANDING N/A 08/16/2012   Procedure:  HEMORRHOID BANDING;  Surgeon: Inda Castle, MD;  [facility];  Service: Endoscopy;  Laterality: N/A;  . HEMORRHOID SURGERY    . left eye surgery    . melenoma resection     right arm  . THYROIDECTOMY    . TONSILLECTOMY    . TOTAL HIP ARTHROPLASTY Right 03/27/2014   Procedure: TOTAL HIP ARTHROPLASTY ANTERIOR APPROACH;  Surgeon: Alta Corning, MD;  Location: [place];  Service: Orthopedics;  Laterality: Right;    Allergies  Allergen Reactions  . Ace Inhibitors Other (See Comments)    Elevated potassium  . Statins Other (See Comments)    Myalgias   . Vicodin [Hydrocodone-Acetaminophen] Other (See Comments)    "felt spaced out"  . Fosamax [Alendronate Sodium] Other (See Comments)  . Metoclopramide Hcl Other (See Comments)  . Sulfa Antibiotics Rash  . Sulfonamide Derivatives Rash  . Vesicare [Solifenacin] Other (See Comments)    Outpatient Encounter Medications as of 06/14/2018  Medication Sig  . acetaminophen (TYLENOL) 500 MG tablet Take 500 mg by mouth every 8 (eight) hours as needed for mild pain or moderate pain.   Marland Kitchen aspirin EC 81 MG tablet Take 81 mg by mouth daily.  . calcium carbonate (CAL-GEST ANTACID) 500 MG chewable tablet Chew 1 tablet by mouth daily as needed for indigestion or heartburn.  . carboxymethylcellulose (REFRESH PLUS) 0.5 % SOLN Place 1 drop into both eyes as needed. For red or dry eyes.  . carboxymethylcellulose (REFRESH TEARS) 0.5 % SOLN Place 1 drop into both eyes 2 (two) times daily.  . Cholecalciferol (VITAMIN D) 2000 UNITS tablet Take 4,000 Units by mouth daily.  Marland Kitchen guaiFENesin (TUSSIN) 100 MG/5ML liquid Take by mouth every 6 (six) hours as needed for cough (10 mL).  Marland Kitchen labetalol (NORMODYNE) 100 MG tablet Take 150 mg by mouth 2 (two) times daily. 1 tablet and 1/2 = 150 mg  . levothyroxine (SYNTHROID, LEVOTHROID) 100 MCG tablet Take 100 mcg by mouth daily before breakfast.  . lisinopril (PRINIVIL,ZESTRIL) 10 MG tablet Take 10 mg by mouth daily.  .  memantine (NAMENDA XR) 28 MG CP24 24 hr capsule Take 28 mg by mouth daily.  . Multiple Vitamins-Minerals (PRESERVISION AREDS 2) CAPS Take 1 capsule by mouth 2 (two) times daily.  . nitroGLYCERIN (NITROSTAT) 0.4 MG SL tablet Place 0.4 mg under the tongue every 5 (five) minutes as needed for chest pain. Give up to 3 times. If no relief call NP/MD.  . omeprazole (PRILOSEC) 20 MG capsule Take 20 mg by mouth daily.  . polyethylene glycol (MIRALAX / GLYCOLAX) packet Take 17 g by mouth every other day.   . protein supplement (RESOURCE BENEPROTEIN) POWD Take 1 scoop by mouth 2 (two) times daily.   Marland Kitchen pyrithione zinc (HEAD AND SHOULDERS) 1 % shampoo Apply 1 application topically 2 (two) times a week. Tues and Fri  . sennosides-docusate sodium (SENOKOT-S) 8.6-50 MG tablet Take 2 tablets by mouth at bedtime.   . [DISCONTINUED] lisinopril (PRINIVIL,ZESTRIL) 5 MG tablet Take 5 mg by mouth daily.  No facility-administered encounter medications on file as of 06/14/2018.    ROS was provided with assistance of staff Review of Systems  Constitutional: Negative for activity change, appetite change, chills, diaphoresis, fatigue, fever and unexpected weight change.  HENT: Positive for hearing loss. Negative for congestion and voice change.   Respiratory: Negative for cough, shortness of breath and wheezing.   Cardiovascular: Negative for chest pain, palpitations and leg swelling.  Gastrointestinal: Negative for abdominal distention, abdominal pain, constipation, diarrhea, nausea and vomiting.  Genitourinary: Negative for difficulty urinating, dysuria and urgency.  Musculoskeletal: Positive for arthralgias and gait problem.  Skin: Negative for color change and pallor.  Neurological: Negative for dizziness, speech difficulty and headaches.       Dementia  Psychiatric/Behavioral: Negative for agitation, behavioral problems, hallucinations and sleep disturbance. The patient is not nervous/anxious.     Immunization  History  Administered Date(s) Administered  . Influenza Whole 01/14/2018  . Influenza, High Dose Seasonal PF 12/27/2013  . Influenza-Unspecified 01/12/2013, 01/02/2015, 01/29/2016, 02/03/2017  . Pneumococcal-Unspecified 07/01/2012  . Td 05/27/2004  . Tdap 02/21/2012  . Zoster 04/15/2007   Pertinent  Health Maintenance Due  Topic Date Due  . PNA vac Low Risk Adult (2 of 2 - PCV13) 07/01/2013  . INFLUENZA VACCINE  Completed  . DEXA SCAN  Completed   Fall Risk  12/01/2017 11/28/2016 05/22/2014 10/06/2013 06/02/2013  Falls in the past year? No No No No No  Risk for fall due to : - - - History of fall(s);Impaired balance/gait;Impaired mobility Impaired mobility   Functional Status Survey:    Vitals:   06/14/18 1031  BP: 130/78  Pulse: 60  Resp: 16  Temp: 98 F (36.7 C)  SpO2: 97%  Weight: 133 lb 14.4 oz (60.7 kg)  Height: 5\' 1"  (1.549 m)   Body mass index is 25.3 kg/m. Physical Exam Constitutional:      General: She is not in acute distress.    Appearance: Normal appearance. She is normal weight. She is not ill-appearing, toxic-appearing or diaphoretic.  HENT:     Head: Normocephalic and atraumatic.     Nose: Nose normal.     Mouth/Throat:     Mouth: Mucous membranes are moist.  Eyes:     Pupils: Pupils are equal, round, and reactive to light.  Neck:     Musculoskeletal: Normal range of motion and neck supple.  Cardiovascular:     Rate and Rhythm: Normal rate and regular rhythm.     Heart sounds: No murmur.  Pulmonary:     Effort: Pulmonary effort is normal.     Breath sounds: No wheezing, rhonchi or rales.  Abdominal:     General: There is no distension.     Palpations: Abdomen is soft.     Tenderness: There is no abdominal tenderness. There is no guarding or rebound.  Musculoskeletal:     Right lower leg: No edema.     Left lower leg: No edema.     Comments: Ambulates with walker.   Skin:    General: Skin is warm.  Neurological:     General: No focal deficit  present.     Mental Status: She is alert. Mental status is at baseline.     Motor: No weakness.     Coordination: Coordination normal.     Gait: Gait abnormal.     Comments: Oriented to person and her room on unit.   Psychiatric:        Mood and Affect: Mood normal.  Behavior: Behavior normal.     Labs reviewed: Recent Labs    06/18/17 08/11/17 01/19/18  NA 142 141 141  K 3.2* 3.4 3.9  CL  --  107 105  CO2  --  25 27  BUN 23* 18 25*  CREATININE 0.7 0.8 0.8  CALCIUM  --  8.6 9.3   Recent Labs    01/19/18  AST 15  ALT 9  ALKPHOS 76  PROT 6.0  ALBUMIN 3.8   Recent Labs    08/04/17 01/19/18  WBC 9.4 9.5  NEUTROABS 6,439  --   HGB 12.4 13.7  HCT 37 40  PLT 250 275   Lab Results  Component Value Date   TSH 0.98 11/19/2017   Lab Results  Component Value Date   HGBA1C 5.8 (H) 07/06/2014   Lab Results  Component Value Date   CHOL 228 (A) 07/15/2016   HDL 55 07/15/2016   LDLCALC 146 07/15/2016   TRIG 141 07/15/2016   CHOLHDL 3.2 07/06/2014    Significant Diagnostic Results in last 30 days:  No results found.  Assessment/Plan Hypothyroidism Stable, continue Levothyroxine 163mcg qd, last TSH 0.98 11/19/17.     Essential hypertension  blood pressure is controlled on Lisinopril 10mg  qd, Labetalol 150mg  bid.  GERD Stable, continue Omeprazole 20mg  qd.   Dementia of the Alzheimer's type Continue SNF [facility] for safety and care assistance, continue Memantine 28mg  qd for memory.   Constipation Stable, continue Senokot S II qhs, MiraLax qod.      Family/ staff Communication: plan of care reviewed with the patient and charge nurse.   Labs/tests ordered:  None  Time spend 25 minutes.

## 2018-06-14 NOTE — Assessment & Plan Note (Signed)
Continue SNF [facility] for safety and care assistance, continue Memantine 28mg qd for memory.  

## 2018-06-14 NOTE — Assessment & Plan Note (Signed)
Stable, continue  Senokot S II qhs, MiraLax qod.  

## 2018-06-15 ENCOUNTER — Encounter: Payer: Self-pay | Admitting: Nurse Practitioner

## 2018-07-05 ENCOUNTER — Non-Acute Institutional Stay (SKILLED_NURSING_FACILITY): Payer: Medicare Other | Admitting: Nurse Practitioner

## 2018-07-05 ENCOUNTER — Encounter: Payer: Self-pay | Admitting: Nurse Practitioner

## 2018-07-05 DIAGNOSIS — G301 Alzheimer's disease with late onset: Secondary | ICD-10-CM | POA: Diagnosis not present

## 2018-07-05 DIAGNOSIS — I10 Essential (primary) hypertension: Secondary | ICD-10-CM

## 2018-07-05 DIAGNOSIS — F0280 Dementia in other diseases classified elsewhere without behavioral disturbance: Secondary | ICD-10-CM

## 2018-07-05 NOTE — Progress Notes (Signed)
Location:  [place] Room Number: 53 Place of Service:  SNF (31) Provider:  Marlana Latus  NP  Mast, Man X, NP  Patient Care Team: Mast, Man X, NP as PCP - General (Internal Medicine) Rankin, Clent Demark, MD as Consulting Physician (Ophthalmology) Burnell Blanks, MD as Consulting Physician (Cardiology) Inda Castle, MD (Inactive) as Consulting Physician (Gastroenterology) Jari Pigg, MD as Consulting Physician (Dermatology) Dorna Leitz, MD as Consulting Physician (Orthopedic Surgery) Mast, Man X, NP as Nurse Practitioner (Internal Medicine)  Extended Emergency Contact Information Primary Emergency Contact: [facility] Address: [address]          [place], [place] [address] Johnnette Litter of [place] Phone: [phone] Relation: Daughter Secondary Emergency Contact: Richanda, Dar[place], [place] [address] Johnnette Litter of [place] Phone: [phone] Relation: Son  Code Status:  DNR Goals of care: Advanced Directive information Advanced Directives 06/14/2018  Does Patient Have a Medical Advance Directive? Yes  Type of Advance Directive Out of facility DNR (pink MOST or yellow form)  Does patient want to make changes to medical advance directive? No - Patient declined  Copy of [place] in Chart? -  Pre-existing out of facility DNR order (yellow form or pink MOST form) Yellow form placed in chart (order not valid for inpatient use)     Chief Complaint  Patient presents with  . Acute Visit    C/o - Elevated BP    HPI:  Pt is a 85 y.o. female seen today for an acute visit for elevated Bp 148/90, 182/80, 160/70, 178/80, 165/70, 168/72, Labetalol 131m bid, Lisinopril 110mqd. HPI was provided with assistance, on Memantine 2862md for memory. HPI was provided with assistance of staff. She denied headache, vision change, chest pain/pressure, or palpitation.     Past Medical History:  Diagnosis Date  . Acute  blood loss anemia 03/29/2014   03/30/14 Hgb 7.3 04/04/14 Hgb 8.5 continue Fe bid.  05/04/14 Hgb 10.8   . Benign neoplasm of colon   . [place] [facility]  pre-melanoma  . Cystocele   . [facility]   05/19/14 MMSE 18/30 [place].    . Diastolic dysfunction 09/09/48/9326 Diverticulosis of colon (without mention of hemorrhage)   . Diverticulosis of large intestine 06/28/2004  . Esophageal reflux   . Essential hypertension 07/05/2013  . Gastroparesis   . Hemorrhoids   . HTN (hypertension)   . Hyperlipidemia 07/05/2013  . Hypertension   . Hypothyroidism   . Internal hemorrhoids without mention of complication 10/71/05/4578 Macular degeneration   . Nonspecific abnormal electrocardiogram (ECG) (EKG)   . Osteoarthritis    right hip  . Other and unspecified hyperlipidemia   . Prediabetes 11/17/2013  . Primary osteoarthritis of right hip 03/27/2014  . Shingles   . [place] cancer ([place]) ([place] . Stricture and stenosis of esophagus   . Stricture and stenosis of esophagus 10/27/2007  . Vitamin D deficiency 11/17/2013   Past Surgical History:  Procedure Laterality Date  . APPENDECTOMY    . BCC resection     legs/face  . CATARACT EXTRACTION, BILATERAL    . CHOLECYSTECTOMY    . FLEXIBLE SIGMOIDOSCOPY  05/12/2011   Procedure: FLEXIBLE SIGMOIDOSCOPY;  Surgeon: RobInda CastleD;  [facility];  Service: Endoscopy;  Laterality: N/A;  . FLEXIBLE SIGMOIDOSCOPY  09/10/2011   Procedure: FLEXIBLE SIGMOIDOSCOPY;  Surgeon:  Inda Castle, MD;  Location: [facility];  Service: Endoscopy;  Laterality: N/A;  . FLEXIBLE SIGMOIDOSCOPY N/A 08/16/2012   Procedure: FLEXIBLE SIGMOIDOSCOPY;  Surgeon: Inda Castle, MD;  [facility];  Service: Endoscopy;  Laterality: N/A;  . FRACTURE SURGERY     left hip pin  . GALLBLADDER SURGERY  2002  . HEMORRHOID BANDING N/A 08/16/2012   Procedure: HEMORRHOID BANDING;  Surgeon: Inda Castle, MD;  [facility];   Service: Endoscopy;  Laterality: N/A;  . HEMORRHOID SURGERY    . left eye surgery    . melenoma resection     right arm  . THYROIDECTOMY    . TONSILLECTOMY    . TOTAL HIP ARTHROPLASTY Right 03/27/2014   Procedure: TOTAL HIP ARTHROPLASTY ANTERIOR APPROACH;  Surgeon: Alta Corning, MD;  Location: [place];  Service: Orthopedics;  Laterality: Right;    Allergies  Allergen Reactions  . Ace Inhibitors Other (See Comments)    Elevated potassium  . Statins Other (See Comments)    Myalgias   . Vicodin [Hydrocodone-Acetaminophen] Other (See Comments)    "felt spaced out"  . Fosamax [Alendronate Sodium] Other (See Comments)  . Metoclopramide Hcl Other (See Comments)  . Sulfa Antibiotics Rash  . Sulfonamide Derivatives Rash  . Vesicare [Solifenacin] Other (See Comments)    Outpatient Encounter Medications as of 07/05/2018  Medication Sig  . acetaminophen (TYLENOL) 500 MG tablet Take 500 mg by mouth every 8 (eight) hours as needed for mild pain or moderate pain.   Marland Kitchen aspirin EC 81 MG tablet Take 81 mg by mouth daily.  . calcium carbonate (CAL-GEST ANTACID) 500 MG chewable tablet Chew 1 tablet by mouth daily as needed for indigestion or heartburn.  . carboxymethylcellulose (REFRESH PLUS) 0.5 % SOLN Place 1 drop into both eyes as needed. For red or dry eyes.  . carboxymethylcellulose (REFRESH TEARS) 0.5 % SOLN Place 1 drop into both eyes 2 (two) times daily.   . Cholecalciferol (VITAMIN D) 2000 UNITS tablet Take 4,000 Units by mouth daily.  Marland Kitchen guaiFENesin (TUSSIN) 100 MG/5ML liquid Take by mouth every 6 (six) hours as needed for cough (10 mL).  Marland Kitchen labetalol (NORMODYNE) 100 MG tablet Take 150 mg by mouth 2 (two) times daily. 1 tablet and 1/2 = 150 mg  . levothyroxine (SYNTHROID, LEVOTHROID) 100 MCG tablet Take 100 mcg by mouth daily before breakfast.  . lisinopril (PRINIVIL,ZESTRIL) 10 MG tablet Take 10 mg by mouth daily.  . memantine (NAMENDA XR) 28 MG CP24 24 hr capsule Take 28 mg by mouth daily.   . Multiple Vitamins-Minerals (PRESERVISION AREDS 2) CAPS Take 1 capsule by mouth 2 (two) times daily.  . nitroGLYCERIN (NITROSTAT) 0.4 MG SL tablet Place 0.4 mg under the tongue every 5 (five) minutes as needed for chest pain. Give up to 3 times. If no relief call NP/MD.  . omeprazole (PRILOSEC) 20 MG capsule Take 20 mg by mouth daily.  . polyethylene glycol (MIRALAX / GLYCOLAX) packet Take 17 g by mouth every other day.   . protein supplement (RESOURCE BENEPROTEIN) POWD Take 1 scoop by mouth 2 (two) times daily.   Marland Kitchen pyrithione zinc (HEAD AND SHOULDERS) 1 % shampoo Apply 1 application topically 2 (two) times a week. Tues and Fri  . sennosides-docusate sodium (SENOKOT-S) 8.6-50 MG tablet Take 2 tablets by mouth at bedtime.    No facility-administered encounter medications on file as of 07/05/2018.    ROS was provided with assistance of staff Review of Systems  Constitutional: Negative for activity change, appetite change, chills, diaphoresis, fatigue and fever.  HENT: Positive for hearing loss. Negative for voice change.   Respiratory: Negative for cough, chest tightness, shortness of breath and wheezing.   Cardiovascular: Negative for chest pain, palpitations and leg swelling.  Gastrointestinal: Negative for abdominal distention, constipation, diarrhea, nausea and vomiting.  Genitourinary: Negative for difficulty urinating, dysuria and urgency.  Musculoskeletal: Positive for arthralgias and gait problem.  Skin: Negative for color change and pallor.  Neurological: Negative for dizziness, speech difficulty, weakness and headaches.  Psychiatric/Behavioral: Negative for agitation, behavioral problems, hallucinations and sleep disturbance. The patient is not nervous/anxious.     Immunization History  Administered Date(s) Administered  . Influenza Whole 01/14/2018  . Influenza, High Dose Seasonal PF 12/27/2013  . Influenza-Unspecified 01/12/2013, 01/02/2015, 01/29/2016, 02/03/2017  .  Pneumococcal-Unspecified 07/01/2012  . Td 05/27/2004  . Tdap 02/21/2012  . Zoster 04/15/2007   Pertinent  Health Maintenance Due  Topic Date Due  . PNA vac Low Risk Adult (2 of 2 - PCV13) 07/01/2013  . INFLUENZA VACCINE  Completed  . DEXA SCAN  Completed   Fall Risk  12/01/2017 11/28/2016 05/22/2014 10/06/2013 06/02/2013  Falls in the past year? No No No No No  Risk for fall due to : - - - History of fall(s);Impaired balance/gait;Impaired mobility Impaired mobility   Functional Status Survey:    Vitals:   07/05/18 1354  BP: (!) 158/70  Pulse: 68  Resp: (!) 22  Temp: 98.2 F (36.8 C)  SpO2: 97%  Weight: 133 lb 4.8 oz (60.5 kg)  Height: 5' 1"  (1.549 m)   Body mass index is 25.19 kg/m. Physical Exam Constitutional:      General: She is not in acute distress.    Appearance: Normal appearance. She is not ill-appearing or toxic-appearing.  HENT:     Head: Normocephalic and atraumatic.     Nose: Nose normal.     Mouth/Throat:     Mouth: Mucous membranes are moist.  Eyes:     Extraocular Movements: Extraocular movements intact.     Pupils: Pupils are equal, round, and reactive to light.  Neck:     Musculoskeletal: Normal range of motion and neck supple.  Cardiovascular:     Rate and Rhythm: Normal rate and regular rhythm.     Heart sounds: No murmur.  Pulmonary:     Effort: Pulmonary effort is normal.     Breath sounds: No wheezing, rhonchi or rales.  Abdominal:     General: There is no distension.     Palpations: Abdomen is soft.     Tenderness: There is no abdominal tenderness. There is no guarding or rebound.  Musculoskeletal:     Right lower leg: No edema.     Left lower leg: No edema.  Skin:    General: Skin is warm and dry.  Neurological:     General: No focal deficit present.     Mental Status: She is alert. Mental status is at baseline.     Cranial Nerves: No cranial nerve deficit.     Motor: No weakness.     Coordination: Coordination normal.     Gait:  Gait abnormal.     Comments: Oriented to person and place. Ambulates with walker.   Psychiatric:        Mood and Affect: Mood normal.        Behavior: Behavior normal.     Labs reviewed: Recent Labs    08/11/17 01/19/18  NA  141 141  K 3.4 3.9  CL 107 105  CO2 25 27  BUN 18 25*  CREATININE 0.8 0.8  CALCIUM 8.6 9.3   Recent Labs    01/19/18  AST 15  ALT 9  ALKPHOS 76  PROT 6.0  ALBUMIN 3.8   Recent Labs    08/04/17 01/19/18  WBC 9.4 9.5  NEUTROABS 6,439  --   HGB 12.4 13.7  HCT 37 40  PLT 250 275   Lab Results  Component Value Date   TSH 0.98 11/19/2017   Lab Results  Component Value Date   HGBA1C 5.8 (H) 07/06/2014   Lab Results  Component Value Date   CHOL 228 (A) 07/15/2016   HDL 55 07/15/2016   LDLCALC 146 07/15/2016   TRIG 141 07/15/2016   CHOLHDL 3.2 07/06/2014    Significant Diagnostic Results in last 30 days:  No results found.  Assessment/Plan Essential hypertension elevated Bp 148/90, 182/80, 160/70, 178/80, 165/70, 168/72. HR 60-70s, continue Labetalol 165m bid, will increase Lisinopril 259mqd. CBC/diff, CMP/eGFR one week  Dementia of the Alzheimer's type Continue SNF [facility] for safety and care assistance, continue Memantine 2872md      Family/ staff Communication: plan of care reviewed with the patient and charge nurse.   Labs/tests ordered:  CBC/diff, CMP/eGFR  Time spend 25 minutes.

## 2018-07-05 NOTE — Assessment & Plan Note (Signed)
elevated Bp 148/90, 182/80, 160/70, 178/80, 165/70, 168/72. HR 60-70s, continue Labetalol 181m bid, will increase Lisinopril 251mqd. CBC/diff, CMP/eGFR one week

## 2018-07-05 NOTE — Assessment & Plan Note (Signed)
Continue SNF [facility] for safety and care assistance, continue Memantine 28mg qd.  

## 2018-07-06 ENCOUNTER — Encounter: Payer: Self-pay | Admitting: Nurse Practitioner

## 2018-07-06 ENCOUNTER — Non-Acute Institutional Stay (SKILLED_NURSING_FACILITY): Payer: Medicare Other | Admitting: Nurse Practitioner

## 2018-07-06 DIAGNOSIS — E876 Hypokalemia: Secondary | ICD-10-CM | POA: Diagnosis not present

## 2018-07-06 DIAGNOSIS — G301 Alzheimer's disease with late onset: Secondary | ICD-10-CM | POA: Diagnosis not present

## 2018-07-06 DIAGNOSIS — D72829 Elevated white blood cell count, unspecified: Secondary | ICD-10-CM | POA: Insufficient documentation

## 2018-07-06 DIAGNOSIS — N179 Acute kidney failure, unspecified: Secondary | ICD-10-CM

## 2018-07-06 DIAGNOSIS — I10 Essential (primary) hypertension: Secondary | ICD-10-CM | POA: Diagnosis not present

## 2018-07-06 DIAGNOSIS — J209 Acute bronchitis, unspecified: Secondary | ICD-10-CM

## 2018-07-06 DIAGNOSIS — F0280 Dementia in other diseases classified elsewhere without behavioral disturbance: Secondary | ICD-10-CM | POA: Diagnosis not present

## 2018-07-06 DIAGNOSIS — R05 Cough: Secondary | ICD-10-CM | POA: Diagnosis not present

## 2018-07-06 DIAGNOSIS — I5032 Chronic diastolic (congestive) heart failure: Secondary | ICD-10-CM

## 2018-07-06 LAB — BASIC METABOLIC PANEL
BUN: 27 — AB (ref 4–21)
Creatinine: 1.1 (ref 0.5–1.1)
Glucose: 93
Potassium: 3.2 — AB (ref 3.4–5.3)
Sodium: 140 (ref 137–147)

## 2018-07-06 LAB — CBC AND DIFFERENTIAL
HCT: 37 (ref 36–46)
Hemoglobin: 12.6 (ref 12.0–16.0)
Platelets: 226 (ref 150–399)
WBC: 21.6

## 2018-07-06 LAB — HEPATIC FUNCTION PANEL
ALT: 7 (ref 7–35)
AST: 15 (ref 13–35)
Alkaline Phosphatase: 70 (ref 25–125)
Bilirubin, Total: 1

## 2018-07-06 NOTE — Assessment & Plan Note (Addendum)
07/06/18 wbc 21.6, Hgb 12.6, plt 226, neutrophils 85.7, Na 140, K 3.2, Bun 27, creat 1.14, TP 5.5, albumin 3.4 Kcl 74mq po qd x 1 week, update CMP/eGFR 1 week.

## 2018-07-06 NOTE — Assessment & Plan Note (Signed)
Acute bronchitis contributory, Levaquin 500mg  qd x 7 days, update CBC/diff one week.

## 2018-07-06 NOTE — Assessment & Plan Note (Addendum)
07/06/18 congestive cough x 1 day, afebrile, denied aches/pains, no O2 desaturation, will obtain CXR ap to r/o PNA, CBC/diff, CMP/eGFR stat. Mucinext 663m bid x 5 days. DuoNeb q8h x 3 days.  07/06/18 wbc 21.6, Hgb 12.6, plt 226, neutrophils 85.7, Na 140, K 3.2, Bun 27, creat 1.14, TP 5.5, albumin 3.4. start Levaquin 5058mqd po x 7 days, update CBC/diff in one week.

## 2018-07-06 NOTE — Progress Notes (Addendum)
Location:  [place] Room Number: 19 Place of Service:  SNF (31) Provider:  Marlana Latus  NP  Mast, Man X, NP  Patient Care Team: Mast, Man X, NP as PCP - General (Internal Medicine) Rankin, Clent Demark, MD as Consulting Physician (Ophthalmology) Burnell Blanks, MD as Consulting Physician (Cardiology) Inda Castle, MD (Inactive) as Consulting Physician (Gastroenterology) Jari Pigg, MD as Consulting Physician (Dermatology) Dorna Leitz, MD as Consulting Physician (Orthopedic Surgery) Mast, Man X, NP as Nurse Practitioner (Internal Medicine)  Extended Emergency Contact Information Primary Emergency Contact: [facility] Address: [address]          [place], [place] [address] Johnnette Litter of [place] Phone: [phone] Relation: Daughter Secondary Emergency Contact: [place], [place], [place] [address] Johnnette Litter of [place] Phone: [phone] Relation: Son  Code Status:  DNR Goals of care: Advanced Directive information Advanced Directives 06/14/2018  Does Patient Have a Medical Advance Directive? Yes  Type of Advance Directive Out of facility DNR (pink MOST or yellow form)  Does patient want to make changes to medical advance directive? No - Patient declined  Copy of [place] in Chart? -  Pre-existing out of facility DNR order (yellow form or pink MOST form) Yellow form placed in chart (order not valid for inpatient use)     Chief Complaint  Patient presents with  . Acute Visit    C/o - congestive cough    HPI:  Pt is a 85 y.o. female seen today for an acute visit for congestive cough x 1 day, HPI was provided with assistance of staff. She denied headache, sore throat, chest pain/pressure, palpitation. She denied aches/pain presently. She resides in [facility] for safety and care assistance, on Memantine 5m qd for memory.    Past Medical History:  Diagnosis Date  . Acute blood loss anemia  03/29/2014   03/30/14 Hgb 7.3 04/04/14 Hgb 8.5 continue Fe bid.  05/04/14 Hgb 10.8   . Benign neoplasm of colon   . [place] ([place]    pre-melanoma  . Cystocele   . Dementia of the Alzheimer's type ([facility] 05/22/2014   05/19/14 MMSE 18/30 [place].    . Diastolic dysfunction 50/88/1103 . Diverticulosis of colon (without mention of hemorrhage)   . Diverticulosis of large intestine 06/28/2004  . Esophageal reflux   . Essential hypertension 07/05/2013  . Gastroparesis   . Hemorrhoids   . HTN (hypertension)   . Hyperlipidemia 07/05/2013  . Hypertension   . Hypothyroidism   . Internal hemorrhoids without mention of complication 115/12/4583 . Macular degeneration   . Nonspecific abnormal electrocardiogram (ECG) (EKG)   . Osteoarthritis    right hip  . Other and unspecified hyperlipidemia   . Prediabetes 11/17/2013  . Primary osteoarthritis of right hip 03/27/2014  . Shingles   . [place] ([place]anoma) ([place]   . Stricture and stenosis of esophagus   . Stricture and stenosis of esophagus 10/27/2007  . Vitamin D deficiency 11/17/2013   Past Surgical History:  Procedure Laterality Date  . APPENDECTOMY    . BCC resection     legs/face  . CATARACT EXTRACTION, BILATERAL    . CHOLECYSTECTOMY    . FLEXIBLE SIGMOIDOSCOPY  05/12/2011   Procedure: FLEXIBLE SIGMOIDOSCOPY;  Surgeon: RInda Castle MD;  Location: [facility];  Service: Endoscopy;  Laterality: N/A;  . FLEXIBLE SIGMOIDOSCOPY  09/10/2011   Procedure: FLEXIBLE SIGMOIDOSCOPY;  Surgeon: RSandy Salaam  Deatra Ina, MD;  Location: [facility];  Service: Endoscopy;  Laterality: N/A;  . FLEXIBLE SIGMOIDOSCOPY N/A 08/16/2012   Procedure: FLEXIBLE SIGMOIDOSCOPY;  Surgeon: Inda Castle, MD;  Location: [facility];  Service: Endoscopy;  Laterality: N/A;  . FRACTURE SURGERY     left hip pin  . GALLBLADDER SURGERY  2002  . HEMORRHOID BANDING N/A 08/16/2012   Procedure: HEMORRHOID BANDING;  Surgeon: Inda Castle, MD;  Location: [facility];  Service: Endoscopy;   Laterality: N/A;  . HEMORRHOID SURGERY    . left eye surgery    . melenoma resection     right arm  . THYROIDECTOMY    . TONSILLECTOMY    . TOTAL HIP ARTHROPLASTY Right 03/27/2014   Procedure: TOTAL HIP ARTHROPLASTY ANTERIOR APPROACH;  Surgeon: Alta Corning, MD;  Location: [place];  Service: Orthopedics;  Laterality: Right;    Allergies  Allergen Reactions  . Ace Inhibitors Other (See Comments)    Elevated potassium  . Statins Other (See Comments)    Myalgias   . Vicodin [Hydrocodone-Acetaminophen] Other (See Comments)    "felt spaced out"  . Fosamax [Alendronate Sodium] Other (See Comments)  . Metoclopramide Hcl Other (See Comments)  . Sulfa Antibiotics Rash  . Sulfonamide Derivatives Rash  . Vesicare [Solifenacin] Other (See Comments)    Outpatient Encounter Medications as of 07/06/2018  Medication Sig  . acetaminophen (TYLENOL) 500 MG tablet Take 500 mg by mouth every 8 (eight) hours as needed for mild pain or moderate pain.   Marland Kitchen aspirin EC 81 MG tablet Take 81 mg by mouth daily.  . calcium carbonate (CAL-GEST ANTACID) 500 MG chewable tablet Chew 1 tablet by mouth daily as needed for indigestion or heartburn.  . carboxymethylcellulose (REFRESH PLUS) 0.5 % SOLN Place 1 drop into both eyes as needed. For red or dry eyes.  . carboxymethylcellulose (REFRESH TEARS) 0.5 % SOLN Place 1 drop into both eyes 2 (two) times daily.   . Cholecalciferol (VITAMIN D) 2000 UNITS tablet Take 4,000 Units by mouth daily.  Marland Kitchen guaiFENesin (TUSSIN) 100 MG/5ML liquid Take by mouth every 6 (six) hours as needed for cough (10 mL).  Marland Kitchen labetalol (NORMODYNE) 100 MG tablet Take 150 mg by mouth 2 (two) times daily. 1 tablet and 1/2 = 150 mg  . levothyroxine (SYNTHROID, LEVOTHROID) 100 MCG tablet Take 100 mcg by mouth daily before breakfast.  . lisinopril (PRINIVIL,ZESTRIL) 10 MG tablet Take 20 mg by mouth daily. Take 2 tablets to equal 20 mg.  . memantine (NAMENDA XR) 28 MG CP24 24 hr capsule Take 28 mg by  mouth daily.  . Multiple Vitamins-Minerals (PRESERVISION AREDS 2) CAPS Take 1 capsule by mouth 2 (two) times daily.  . nitroGLYCERIN (NITROSTAT) 0.4 MG SL tablet Place 0.4 mg under the tongue every 5 (five) minutes as needed for chest pain. Give up to 3 times. If no relief call NP/MD.  . omeprazole (PRILOSEC) 20 MG capsule Take 20 mg by mouth daily.  . polyethylene glycol (MIRALAX / GLYCOLAX) packet Take 17 g by mouth every other day.   . protein supplement (RESOURCE BENEPROTEIN) POWD Take 1 scoop by mouth 2 (two) times daily.   Marland Kitchen pyrithione zinc (HEAD AND SHOULDERS) 1 % shampoo Apply 1 application topically 2 (two) times a week. Tues and Fri  . sennosides-docusate sodium (SENOKOT-S) 8.6-50 MG tablet Take 2 tablets by mouth at bedtime.    No facility-administered encounter medications on file as of 07/06/2018.     ROS as provided with  assistance of staff Review of Systems  Constitutional: Positive for activity change, appetite change and fatigue. Negative for chills, diaphoresis and fever.  HENT: Positive for hearing loss. Negative for congestion, rhinorrhea, sinus pressure, sinus pain, sore throat, trouble swallowing and voice change.   Respiratory: Positive for cough and wheezing. Negative for shortness of breath.   Cardiovascular: Negative for chest pain, palpitations and leg swelling.  Gastrointestinal: Negative for abdominal distention, abdominal pain, constipation, diarrhea, nausea and vomiting.  Musculoskeletal: Positive for gait problem.  Skin: Negative for color change and pallor.  Neurological: Negative for dizziness, speech difficulty, weakness and headaches.       Dementia  Psychiatric/Behavioral: Negative for agitation, behavioral problems, hallucinations and sleep disturbance. The patient is not nervous/anxious.     Immunization History  Administered Date(s) Administered  . Influenza Whole 01/14/2018  . Influenza, High Dose Seasonal PF 12/27/2013  . Influenza-Unspecified  01/12/2013, 01/02/2015, 01/29/2016, 02/03/2017  . Pneumococcal-Unspecified 07/01/2012  . Td 05/27/2004  . Tdap 02/21/2012  . Zoster 04/15/2007   Pertinent  Health Maintenance Due  Topic Date Due  . PNA vac Low Risk Adult (2 of 2 - PCV13) 07/01/2013  . INFLUENZA VACCINE  Completed  . DEXA SCAN  Completed   Fall Risk  12/01/2017 11/28/2016 05/22/2014 10/06/2013 06/02/2013  Falls in the past year? No No No No No  Risk for fall due to : - - - History of fall(s);Impaired balance/gait;Impaired mobility Impaired mobility   Functional Status Survey:    Vitals:   07/06/18 1028  BP: (!) 150/80  Pulse: 78  Resp: (!) 22  Temp: 98.2 F (36.8 C)  SpO2: 97%  Weight: 133 lb 4.8 oz (60.5 kg)  Height: 5' 1"  (1.549 m)   Body mass index is 25.19 kg/m. Physical Exam Constitutional:      General: She is not in acute distress.    Appearance: Normal appearance. She is not ill-appearing or diaphoretic.  HENT:     Head: Normocephalic and atraumatic.     Nose: Nose normal.     Mouth/Throat:     Mouth: Mucous membranes are moist.  Eyes:     Extraocular Movements: Extraocular movements intact.     Conjunctiva/sclera: Conjunctivae normal.     Pupils: Pupils are equal, round, and reactive to light.  Neck:     Musculoskeletal: Normal range of motion and neck supple.  Cardiovascular:     Rate and Rhythm: Normal rate and regular rhythm.     Heart sounds: No murmur.  Pulmonary:     Breath sounds: Wheezing, rhonchi and rales present.  Musculoskeletal:     Right lower leg: No edema.     Left lower leg: No edema.  Skin:    General: Skin is warm and dry.  Neurological:     General: No focal deficit present.     Mental Status: She is alert. Mental status is at baseline.     Gait: Gait abnormal.  Psychiatric:        Mood and Affect: Mood normal.        Behavior: Behavior normal.     Labs reviewed: Recent Labs    08/11/17 01/19/18  NA 141 141  K 3.4 3.9  CL 107 105  CO2 25 27  BUN 18 25*   CREATININE 0.8 0.8  CALCIUM 8.6 9.3   Recent Labs    01/19/18  AST 15  ALT 9  ALKPHOS 76  PROT 6.0  ALBUMIN 3.8   Recent Labs  08/04/17 01/19/18  WBC 9.4 9.5  NEUTROABS 6,439  --   HGB 12.4 13.7  HCT 37 40  PLT 250 275   Lab Results  Component Value Date   TSH 0.98 11/19/2017   Lab Results  Component Value Date   HGBA1C 5.8 (H) 07/06/2014   Lab Results  Component Value Date   CHOL 228 (A) 07/15/2016   HDL 55 07/15/2016   LDLCALC 146 07/15/2016   TRIG 141 07/15/2016   CHOLHDL 3.2 07/06/2014    Significant Diagnostic Results in last 30 days:  No results found.  Assessment/Plan  Acute bronchitis 07/06/18 congestive cough x 1 day, afebrile, denied aches/pains, no O2 desaturation, will obtain CXR ap to r/o PNA, CBC/diff, CMP/eGFR stat. Mucinext 649m bid x 5 days. DuoNeb q8h x 3 days.  07/06/18 wbc 21.6, Hgb 12.6, plt 226, neutrophils 85.7, Na 140, K 3.2, Bun 27, creat 1.14, TP 5.5, albumin 3.4. start Levaquin 5057mqd po x 7 days, update CBC/diff in one week.    Dementia of the Alzheimer's type Continue SNF [facility] for safety and care assistance, continue Memantine 2825md for memory.   Leukocytosis Acute bronchitis contributory, Levaquin 500m52m x 7 days, update CBC/diff one week.   Hypokalemia 07/06/18 wbc 21.6, Hgb 12.6, plt 226, neutrophils 85.7, Na 140, K 3.2, Bun 27, creat 1.14, TP 5.5, albumin 3.4 Kcl 20me71m qd x 1 week, update CMP/eGFR 1 week.   Acute kidney injury (HCC) [place]4/20 wbc 21.6, Hgb 12.6, plt 226, neutrophils 85.7, Na 140, K 3.2, Bun 27, creat 1.14, eGFR 41,  TP 5.5, albumin 3.4 Encourage oral fluid intake, update CMP/eGFR one week.   Chronic diastolic heart failure (HCC) [place]4/20 CXR pulmonary venous congestion, bibasilar pulmonary infiltrates, small left pleural effusion. Will adding Furosemide 20mg 5m 3 days. Update BNP, echocardiogram.      Family/ staff Communication: plan of care reviewed with the patient and charge nurse.    Labs/tests ordered:  CBC/diff, CMP/eGFR, CXR ap, BNP, echocardiogram.   Time spend 25 minutes.

## 2018-07-06 NOTE — Assessment & Plan Note (Signed)
07/06/18 wbc 21.6, Hgb 12.6, plt 226, neutrophils 85.7, Na 140, K 3.2, Bun 27, creat 1.14, eGFR 41,  TP 5.5, albumin 3.4 Encourage oral fluid intake, update CMP/eGFR one week.

## 2018-07-06 NOTE — Assessment & Plan Note (Signed)
Continue SNF [facility] for safety and care assistance, continue Memantine 28mg qd for memory.  

## 2018-07-07 NOTE — Assessment & Plan Note (Signed)
07/06/18 CXR pulmonary venous congestion, bibasilar pulmonary infiltrates, small left pleural effusion. Will adding Furosemide 20mg  qd x 3 days. Update BNP, echocardiogram.

## 2018-07-08 DIAGNOSIS — I5032 Chronic diastolic (congestive) heart failure: Secondary | ICD-10-CM | POA: Diagnosis not present

## 2018-07-13 DIAGNOSIS — I10 Essential (primary) hypertension: Secondary | ICD-10-CM | POA: Diagnosis not present

## 2018-07-13 LAB — HEPATIC FUNCTION PANEL
ALT: 16 (ref 7–35)
AST: 14 (ref 13–35)
Alkaline Phosphatase: 60 (ref 25–125)
Bilirubin, Total: 0.5

## 2018-07-13 LAB — BASIC METABOLIC PANEL
BUN: 15 (ref 4–21)
Creatinine: 0.7 (ref <=1.1)
Glucose: 80
Potassium: 3.4 (ref 3.4–5.3)
Sodium: 141 (ref 137–147)

## 2018-07-13 LAB — CBC AND DIFFERENTIAL
HCT: 36 (ref 36–46)
Hemoglobin: 12.2 (ref 12.0–16.0)
Platelets: 294 (ref 150–399)
WBC: 11.2

## 2018-07-14 ENCOUNTER — Encounter: Payer: Self-pay | Admitting: Nurse Practitioner

## 2018-07-14 ENCOUNTER — Non-Acute Institutional Stay (SKILLED_NURSING_FACILITY): Payer: Medicare Other | Admitting: Nurse Practitioner

## 2018-07-14 DIAGNOSIS — G301 Alzheimer's disease with late onset: Secondary | ICD-10-CM

## 2018-07-14 DIAGNOSIS — J209 Acute bronchitis, unspecified: Secondary | ICD-10-CM | POA: Diagnosis not present

## 2018-07-14 DIAGNOSIS — I5189 Other ill-defined heart diseases: Secondary | ICD-10-CM

## 2018-07-14 DIAGNOSIS — F0280 Dementia in other diseases classified elsewhere without behavioral disturbance: Secondary | ICD-10-CM

## 2018-07-14 DIAGNOSIS — I10 Essential (primary) hypertension: Secondary | ICD-10-CM

## 2018-07-14 NOTE — Progress Notes (Signed)
Location:  [facility] Room Number: 57A Place of Service:  SNF (31) Provider:  Jamyla Ard X, NP  Charlies Rayburn X, NP  Patient Care Team: Sereniti Wan X, NP as PCP - General (Internal Medicine) Rankin, Clent Demark, MD as Consulting Physician (Ophthalmology) Burnell Blanks, MD as Consulting Physician (Cardiology) Inda Castle, MD (Inactive) as Consulting Physician (Gastroenterology) Jari Pigg, MD as Consulting Physician (Dermatology) Dorna Leitz, MD as Consulting Physician (Orthopedic Surgery) Abeeha Twist X, NP as Nurse Practitioner (Internal Medicine)  Extended Emergency Contact Information Primary Emergency Contact: [facility] Address: [address]          [place], [place] [address] Johnnette Litter of [place] Phone: [phone] Relation: Daughter Secondary Emergency Contact: [place], [place], [place] [address] Johnnette Litter of [place] Phone: [phone] Relation: Son  Code Status:  DNR Goals of care: Advanced Directive information Advanced Directives 07/14/2018  Does Patient Have a Medical Advance Directive? Yes  Type of Advance Directive Out of facility DNR (pink MOST or yellow form)  Does patient want to make changes to medical advance directive? No - Guardian declined  Copy of [place] in Chart? -  Pre-existing out of facility DNR order (yellow form or pink MOST form) Yellow form placed in chart (order not valid for inpatient use)     Chief Complaint  Patient presents with  . Acute Visit    Persistant Couging    HPI:  Pt is a 85 y.o. female seen today for an acute visit for persisted productive cough after completion of 7 day course of Levaquin 500mg  qd, Duoneb q8hr x 3days since 07/06/18 for acute bronchitis. Reported the patient produced thick milky sputum and expiratory wheezes. She is afebrile, no O2 desaturation. She denied chest pain, palpitation, headache, change of vision, nausea, vomiting, or abd  pain. CXR 07/06/18 pulmonary venous congestion, bibasilar pulmonary infiltrates. Elevated Bp, 166/72, 162/100, 176/90, 168/88, 170/100, 180/80, 181/87, 164/104, 174/88, 174/80, 168/90, 177/110, 186/92, 200/90, on Labetalol 150mg  bid, Lisinopril 20mg  qd. 07/13/18 wbc 11.2 trended down from 21.6 07/06/18, Hgb 12.2, plt 294, neutrophils 72.2%, Na 141, K 3.4 up from 3.2 07/06/18, Bun 15, creat 0.74. HPI was provided with assistance of staff. On Namenda 28mg  qd for memory.   Past Medical History:  Diagnosis Date  . Acute blood loss anemia 03/29/2014   03/30/14 Hgb 7.3 04/04/14 Hgb 8.5 continue Fe bid.  05/04/14 Hgb 10.8   . Benign neoplasm of colon   . Cancer ([place])    pre-melanoma  . Cystocele   . Dementia of the Alzheimer's type [facility]) 05/22/2014   05/19/14 MMSE 18/30 Namenda.    . Diastolic dysfunction 4/48/1856  . Diverticulosis of colon (without mention of hemorrhage)   . Diverticulosis of large intestine 06/28/2004  . Esophageal reflux   . Essential hypertension 07/05/2013  . Gastroparesis   . Hemorrhoids   . HTN (hypertension)   . Hyperlipidemia 07/05/2013  . Hypertension   . Hypothyroidism   . Internal hemorrhoids without mention of complication 31/07/9700  . Macular degeneration   . Nonspecific abnormal electrocardiogram (ECG) (EKG)   . Osteoarthritis    right hip  . Other and unspecified hyperlipidemia   . Prediabetes 11/17/2013  . Primary osteoarthritis of right hip 03/27/2014  . Shingles   . Skin cancer (melanoma) ([place])   . Stricture and stenosis of esophagus   . Stricture and stenosis of esophagus 10/27/2007  . Vitamin D  deficiency 11/17/2013   Past Surgical History:  Procedure Laterality Date  . APPENDECTOMY    . BCC resection     legs/face  . CATARACT EXTRACTION, BILATERAL    . CHOLECYSTECTOMY    . FLEXIBLE SIGMOIDOSCOPY  05/12/2011   Procedure: FLEXIBLE SIGMOIDOSCOPY;  Surgeon: Inda Castle, MD;  Location: [facility];  Service: Endoscopy;  Laterality: N/A;  . FLEXIBLE  SIGMOIDOSCOPY  09/10/2011   Procedure: FLEXIBLE SIGMOIDOSCOPY;  Surgeon: Inda Castle, MD;  Location: [facility];  Service: Endoscopy;  Laterality: N/A;  . FLEXIBLE SIGMOIDOSCOPY N/A 08/16/2012   Procedure: FLEXIBLE SIGMOIDOSCOPY;  Surgeon: Inda Castle, MD;  Location: [facility];  Service: Endoscopy;  Laterality: N/A;  . FRACTURE SURGERY     left hip pin  . GALLBLADDER SURGERY  2002  . HEMORRHOID BANDING N/A 08/16/2012   Procedure: HEMORRHOID BANDING;  Surgeon: Inda Castle, MD;  Location: [facility];  Service: Endoscopy;  Laterality: N/A;  . HEMORRHOID SURGERY    . left eye surgery    . melenoma resection     right arm  . THYROIDECTOMY    . TONSILLECTOMY    . TOTAL HIP ARTHROPLASTY Right 03/27/2014   Procedure: TOTAL HIP ARTHROPLASTY ANTERIOR APPROACH;  Surgeon: Alta Corning, MD;  Location: [facility];  Service: Orthopedics;  Laterality: Right;    Allergies  Allergen Reactions  . Ace Inhibitors Other (See Comments)    Elevated potassium  . Statins Other (See Comments)    Myalgias   . Vicodin [Hydrocodone-Acetaminophen] Other (See Comments)    "felt spaced out"  . Fosamax [Alendronate Sodium] Other (See Comments)  . Metoclopramide Hcl Other (See Comments)  . Sulfa Antibiotics Rash  . Sulfonamide Derivatives Rash  . Vesicare [Solifenacin] Other (See Comments)    Outpatient Encounter Medications as of 07/14/2018  Medication Sig  . acetaminophen (TYLENOL) 500 MG tablet Take 500 mg by mouth every 8 (eight) hours as needed for mild pain or moderate pain.   Marland Kitchen aspirin EC 81 MG tablet Take 81 mg by mouth daily.  . calcium carbonate (CAL-GEST ANTACID) 500 MG chewable tablet Chew 1 tablet by mouth daily as needed for indigestion or heartburn.  . carboxymethylcellulose (REFRESH PLUS) 0.5 % SOLN Place 1 drop into both eyes as needed. For red or dry eyes.  . carboxymethylcellulose (REFRESH TEARS) 0.5 % SOLN Place 1 drop into both eyes 2 (two) times daily.   . Cholecalciferol  (VITAMIN D) 2000 UNITS tablet Take 4,000 Units by mouth daily.  Marland Kitchen guaiFENesin (TUSSIN) 100 MG/5ML liquid Take by mouth every 6 (six) hours as needed for cough (10 mL).  Marland Kitchen labetalol (NORMODYNE) 100 MG tablet Take 150 mg by mouth 2 (two) times daily. 1 tablet and 1/2 = 150 mg  . levothyroxine (SYNTHROID, LEVOTHROID) 100 MCG tablet Take 100 mcg by mouth daily before breakfast.  . lisinopril (PRINIVIL,ZESTRIL) 10 MG tablet Take 20 mg by mouth daily. Take 2 tablets to equal 20 mg.  . memantine (NAMENDA XR) 28 MG CP24 24 hr capsule Take 28 mg by mouth daily.  . Multiple Vitamins-Minerals (PRESERVISION AREDS 2) CAPS Take 1 capsule by mouth 2 (two) times daily.  . nitroGLYCERIN (NITROSTAT) 0.4 MG SL tablet Place 0.4 mg under the tongue every 5 (five) minutes as needed for chest pain. Give up to 3 times. If no relief call NP/MD.  . omeprazole (PRILOSEC) 20 MG capsule Take 20 mg by mouth daily.  . polyethylene glycol (MIRALAX / GLYCOLAX) packet Take 17 g by  mouth every other day.   . protein supplement (RESOURCE BENEPROTEIN) POWD Take 1 scoop by mouth 2 (two) times daily.   Marland Kitchen pyrithione zinc (HEAD AND SHOULDERS) 1 % shampoo Apply 1 application topically 2 (two) times a week. Tues and Fri  . sennosides-docusate sodium (SENOKOT-S) 8.6-50 MG tablet Take 2 tablets by mouth at bedtime.    No facility-administered encounter medications on file as of 07/14/2018.    ROS was provided with assistance of staff Review of Systems  Constitutional: Positive for activity change, appetite change and fatigue. Negative for chills, diaphoresis and fever.  HENT: Positive for hearing loss. Negative for congestion and voice change.   Eyes: Negative for visual disturbance.  Respiratory: Positive for cough, shortness of breath and wheezing.   Cardiovascular: Negative for chest pain, palpitations and leg swelling.  Gastrointestinal: Negative for abdominal distention, abdominal pain, constipation, diarrhea, nausea and vomiting.   Genitourinary: Negative for difficulty urinating, dysuria and urgency.  Musculoskeletal: Positive for gait problem.  Skin: Negative for color change and pallor.  Neurological: Negative for dizziness, speech difficulty, weakness and headaches.       Dementia  Psychiatric/Behavioral: Positive for confusion. Negative for agitation, behavioral problems, hallucinations and sleep disturbance. The patient is not nervous/anxious.     Immunization History  Administered Date(s) Administered  . Influenza Whole 01/14/2018  . Influenza, High Dose Seasonal PF 12/27/2013  . Influenza-Unspecified 01/12/2013, 01/02/2015, 01/29/2016, 02/03/2017  . Pneumococcal-Unspecified 07/01/2012  . Td 05/27/2004  . Tdap 02/21/2012  . Zoster 04/15/2007   Pertinent  Health Maintenance Due  Topic Date Due  . PNA vac Low Risk Adult (2 of 2 - PCV13) 07/01/2013  . INFLUENZA VACCINE  11/13/2018  . DEXA SCAN  Completed   Fall Risk  12/01/2017 11/28/2016 05/22/2014 10/06/2013 06/02/2013  Falls in the past year? No No No No No  Risk for fall due to : - - - History of fall(s);Impaired balance/gait;Impaired mobility Impaired mobility   Functional Status Survey:    Vitals:   07/14/18 1045  BP: (!) 166/84  Pulse: 72  Resp: 20  Temp: 98.6 F (37 C)  TempSrc: Oral  SpO2: 97%  Weight: 133 lb 4.8 oz (60.5 kg)  Height: 5\' 1"  (1.549 m)   Body mass index is 25.19 kg/m. Physical Exam Constitutional:      General: She is not in acute distress.    Appearance: Normal appearance. She is not ill-appearing, toxic-appearing or diaphoretic.  HENT:     Head: Normocephalic and atraumatic.     Nose: Nose normal.     Mouth/Throat:     Mouth: Mucous membranes are moist.  Eyes:     Extraocular Movements: Extraocular movements intact.     Pupils: Pupils are equal, round, and reactive to light.  Neck:     Musculoskeletal: Normal range of motion and neck supple.  Cardiovascular:     Rate and Rhythm: Normal rate.     Heart sounds:  No murmur.  Pulmonary:     Breath sounds: No stridor. Rales present. No wheezing.     Comments: Posterior bibasilar rales, central congestion. Abdominal:     General: There is no distension.     Palpations: Abdomen is soft.     Tenderness: There is no abdominal tenderness. There is no guarding or rebound.  Musculoskeletal:     Right lower leg: No edema.     Left lower leg: No edema.     Comments: Ambulates with walker,   Skin:    General:  Skin is warm and dry.  Neurological:     General: No focal deficit present.     Mental Status: She is alert. Mental status is at baseline.     Cranial Nerves: No cranial nerve deficit.     Motor: No weakness.     Coordination: Coordination normal.     Gait: Gait abnormal.     Comments: Oriented to self.   Psychiatric:        Mood and Affect: Mood normal.        Behavior: Behavior normal.     Labs reviewed: Recent Labs    08/11/17 01/19/18 07/06/18  NA 141 141 140  K 3.4 3.9 3.2*  CL 107 105  --   CO2 25 27  --   BUN 18 25* 27*  CREATININE 0.8 0.8 1.1  CALCIUM 8.6 9.3  --    Recent Labs    01/19/18 07/06/18  AST 15 15  ALT 9 7  ALKPHOS 76 70  PROT 6.0  --   ALBUMIN 3.8  --    Recent Labs    08/04/17 01/19/18 07/06/18  WBC 9.4 9.5 21.6  NEUTROABS 6,439  --   --   HGB 12.4 13.7 12.6  HCT 37 40 37  PLT 250 275 226   Lab Results  Component Value Date   TSH 0.98 11/19/2017   Lab Results  Component Value Date   HGBA1C 5.8 (H) 07/06/2014   Lab Results  Component Value Date   CHOL 228 (A) 07/15/2016   HDL 55 07/15/2016   LDLCALC 146 07/15/2016   TRIG 141 07/15/2016   CHOLHDL 3.2 07/06/2014    Significant Diagnostic Results in last 30 days  Assessment/Plan Acute bronchitis persisted productive cough after completion of 7 day course of Levaquin 500mg  qd, Duoneb q8hr x 3days since 07/06/18 for acute bronchitis. Reported the patient produced thick milky sputum and expiratory wheezes. She is afebrile, no O2 desaturation.  She denied chest pain, palpitation, headache, change of vision, nausea, vomiting, or abd pain. CXR 07/06/18 pulmonary venous congestion, bibasilar pulmonary infiltrates. 07/13/18 wbc 11.2 trended down from 21.6 07/06/18, Hgb 12.2, plt 294, neutrophils 72.2%, Na 141, K 3.4 up from 3.2 07/06/18, Bun 15, creat 0.74. will repeat CXR, start Medrol dose pk, DuoNeb q8hr x 1week. Prn Tussin is available to her.   Essential hypertension Elevated Bp, 166/72, 162/100, 176/90, 168/88, 170/100, 180/80, 181/87, 164/104, 174/88, 174/80, 168/90, 177/110, 186/92, 200/90, continue  Labetalol 150mg  bid, increases Lisinopril 40mg  qd. Update BMP one week. Observe.   Dementia of the Alzheimer's type Continue SNF [facility] for safety and care assistance, continue Memantine 28mg  qd for memory.   Diastolic dysfunction 12/05/21 Na 142, K 4.1, Bun 21, creat 0.81, BNP 196  CXR 07/06/18 pulmonary venous congestion, bibasilar pulmonary infiltrates. 07/08/18 BNP 197 Completed 3 day course of Furosemide 20mg  qd 07/14/18 update CXR      Family/ staff Communication: plan of care reviewed with the patient and charge nurse  Labs/tests ordered:  CXR ap. BMP one week.   Time spend 25 minutes.

## 2018-07-14 NOTE — Assessment & Plan Note (Addendum)
persisted productive cough after completion of 7 day course of Levaquin 500mg  qd, Duoneb q8hr x 3days since 07/06/18 for acute bronchitis. Reported the patient produced thick milky sputum and expiratory wheezes. She is afebrile, no O2 desaturation. She denied chest pain, palpitation, headache, change of vision, nausea, vomiting, or abd pain. CXR 07/06/18 pulmonary venous congestion, bibasilar pulmonary infiltrates. 07/13/18 wbc 11.2 trended down from 21.6 07/06/18, Hgb 12.2, plt 294, neutrophils 72.2%, Na 141, K 3.4 up from 3.2 07/06/18, Bun 15, creat 0.74. will repeat CXR, start Medrol dose pk, DuoNeb q8hr x 1week. Prn Tussin is available to her.

## 2018-07-14 NOTE — Assessment & Plan Note (Addendum)
11/22/16 Na 142, K 4.1, Bun 21, creat 0.81, BNP 196  CXR 07/06/18 pulmonary venous congestion, bibasilar pulmonary infiltrates. 07/08/18 BNP 197 Completed 3 day course of Furosemide 20mg  qd 07/14/18 update CXR

## 2018-07-14 NOTE — Assessment & Plan Note (Signed)
Continue SNF [facility] for safety and care assistance, continue Memantine 28mg  qd for memory.

## 2018-07-14 NOTE — Assessment & Plan Note (Addendum)
Elevated Bp, 166/72, 162/100, 176/90, 168/88, 170/100, 180/80, 181/87, 164/104, 174/88, 174/80, 168/90, 177/110, 186/92, 200/90, continue  Labetalol 150mg  bid, increases Lisinopril 40mg  qd. Update BMP one week. Observe.

## 2018-07-15 DIAGNOSIS — R05 Cough: Secondary | ICD-10-CM | POA: Diagnosis not present

## 2018-07-19 ENCOUNTER — Other Ambulatory Visit: Payer: Self-pay | Admitting: *Deleted

## 2018-07-19 ENCOUNTER — Non-Acute Institutional Stay (SKILLED_NURSING_FACILITY): Payer: Medicare Other | Admitting: Nurse Practitioner

## 2018-07-19 ENCOUNTER — Encounter: Payer: Self-pay | Admitting: Nurse Practitioner

## 2018-07-19 DIAGNOSIS — G301 Alzheimer's disease with late onset: Secondary | ICD-10-CM

## 2018-07-19 DIAGNOSIS — K5900 Constipation, unspecified: Secondary | ICD-10-CM

## 2018-07-19 DIAGNOSIS — J209 Acute bronchitis, unspecified: Secondary | ICD-10-CM

## 2018-07-19 DIAGNOSIS — I10 Essential (primary) hypertension: Secondary | ICD-10-CM | POA: Diagnosis not present

## 2018-07-19 DIAGNOSIS — F0280 Dementia in other diseases classified elsewhere without behavioral disturbance: Secondary | ICD-10-CM

## 2018-07-19 DIAGNOSIS — E039 Hypothyroidism, unspecified: Secondary | ICD-10-CM

## 2018-07-19 DIAGNOSIS — K219 Gastro-esophageal reflux disease without esophagitis: Secondary | ICD-10-CM

## 2018-07-19 LAB — COMPREHENSIVE METABOLIC PANEL
Albumin: 3.3
Calcium: 8.3
Carbon Dioxide, Total: 25
Chloride: 107
EGFR (Non-African Amer.): 69
Globulin: 2.2
Total Protein: 5.5 g/dL

## 2018-07-19 LAB — BRAIN NATRIURETIC PEPTIDE: B Natriuretic Peptide: 197

## 2018-07-19 NOTE — Progress Notes (Signed)
Location:  [facility] Room Number: 68 Place of Service:  SNF (31) Provider: Marlana Latus  NP  Maitland Muhlbauer X, NP  Patient Care Team: Noelle Sease X, NP as PCP - General (Internal Medicine) Rankin, Clent Demark, MD as Consulting Physician (Ophthalmology) Burnell Blanks, MD as Consulting Physician (Cardiology) Inda Castle, MD (Inactive) as Consulting Physician (Gastroenterology) Jari Pigg, MD as Consulting Physician (Dermatology) Dorna Leitz, MD as Consulting Physician (Orthopedic Surgery) Deone Omahoney X, NP as Nurse Practitioner (Internal Medicine)  Extended Emergency Contact Information Primary Emergency Contact: [facility] Address: [address]          [place], [place] [address] Johnnette Litter of [place] Phone: [phone] Relation: Daughter Secondary Emergency Contact: [place], [place], [place] [address] Johnnette Litter of [place] Phone: [phone] Relation: Son  Code Status:  DNR Goals of care: Advanced Directive information Advanced Directives 07/14/2018  Does Patient Have a Medical Advance Directive? Yes  Type of Advance Directive Out of facility DNR (pink MOST or yellow form)  Does patient want to make changes to medical advance directive? No - Guardian declined  Copy of [place] in Chart? -  Pre-existing out of facility DNR order (yellow form or pink MOST form) Yellow form placed in chart (order not valid for inpatient use)     Chief Complaint  Patient presents with  . Medical Management of Chronic Issues    HPI:  Pt is a 85 y.o. female seen today for medical management of chronic diseases.     The patient has resides in [facility] for safety and care assistance, on Memantine 28mg  qd po for memory. Constipation, stable on Senokot S II qhs, MiraLax qod. GERD, stable on Omeprazole 20mg  qd. HTN, blood pressure is controlled on Lisinopril 40mg  qd, Labetalol 150mg  bid. Hypothyroidism, on Levothyroxine 161mcg  po qd, last TSH 0.98 11/19/17.    Past Medical History:  Diagnosis Date  . Acute blood loss anemia 03/29/2014   03/30/14 Hgb 7.3 04/04/14 Hgb 8.5 continue Fe bid.  05/04/14 Hgb 10.8   . Benign neoplasm of colon   . Cancer ([place])    pre-melanoma  . Cystocele   . Dementia of the Alzheimer's type [facility]) 05/22/2014   05/19/14 MMSE 18/30 Namenda.    . Diastolic dysfunction 08/29/15  . Diverticulosis of colon (without mention of hemorrhage)   . Diverticulosis of large intestine 06/28/2004  . Esophageal reflux   . Essential hypertension 07/05/2013  . Gastroparesis   . Hemorrhoids   . HTN (hypertension)   . Hyperlipidemia 07/05/2013  . Hypertension   . Hypothyroidism   . Internal hemorrhoids without mention of complication 49/07/4965  . Macular degeneration   . Nonspecific abnormal electrocardiogram (ECG) (EKG)   . Osteoarthritis    right hip  . Other and unspecified hyperlipidemia   . Prediabetes 11/17/2013  . Primary osteoarthritis of right hip 03/27/2014  . Shingles   . Skin cancer (melanoma) ([place])   . Stricture and stenosis of esophagus   . Stricture and stenosis of esophagus 10/27/2007  . Vitamin D deficiency 11/17/2013   Past Surgical History:  Procedure Laterality Date  . APPENDECTOMY    . BCC resection     legs/face  . CATARACT EXTRACTION, BILATERAL    . CHOLECYSTECTOMY    . FLEXIBLE SIGMOIDOSCOPY  05/12/2011   Procedure: FLEXIBLE SIGMOIDOSCOPY;  Surgeon: Inda Castle, MD;  [facility]: [facility];  Service: Endoscopy;  Laterality:  N/A;  . FLEXIBLE SIGMOIDOSCOPY  09/10/2011   Procedure: FLEXIBLE SIGMOIDOSCOPY;  Surgeon: Inda Castle, MD;  [facility];  Service: Endoscopy;  Laterality: N/A;  . FLEXIBLE SIGMOIDOSCOPY N/A 08/16/2012   Procedure: FLEXIBLE SIGMOIDOSCOPY;  Surgeon: Inda Castle, MD;  [facility];  Service: Endoscopy;  Laterality: N/A;  . FRACTURE SURGERY     left hip pin  . GALLBLADDER SURGERY  2002  . HEMORRHOID BANDING N/A 08/16/2012    Procedure: HEMORRHOID BANDING;  Surgeon: Inda Castle, MD;  [facility];  Service: Endoscopy;  Laterality: N/A;  . HEMORRHOID SURGERY    . left eye surgery    . melenoma resection     right arm  . THYROIDECTOMY    . TONSILLECTOMY    . TOTAL HIP ARTHROPLASTY Right 03/27/2014   Procedure: TOTAL HIP ARTHROPLASTY ANTERIOR APPROACH;  Surgeon: Alta Corning, MD;  Location: [facility];  Service: Orthopedics;  Laterality: Right;    Allergies  Allergen Reactions  . Ace Inhibitors Other (See Comments)    Elevated potassium  . Statins Other (See Comments)    Myalgias   . Vicodin [Hydrocodone-Acetaminophen] Other (See Comments)    "felt spaced out"  . Fosamax [Alendronate Sodium] Other (See Comments)  . Metoclopramide Hcl Other (See Comments)  . Sulfa Antibiotics Rash  . Sulfonamide Derivatives Rash  . Vesicare [Solifenacin] Other (See Comments)    Outpatient Encounter Medications as of 07/19/2018  Medication Sig  . acetaminophen (TYLENOL) 500 MG tablet Take 500 mg by mouth every 8 (eight) hours as needed for mild pain or moderate pain.   Marland Kitchen aspirin EC 81 MG tablet Take 81 mg by mouth daily.  . calcium carbonate (CAL-GEST ANTACID) 500 MG chewable tablet Chew 1 tablet by mouth daily as needed for indigestion or heartburn.  . carboxymethylcellulose (REFRESH PLUS) 0.5 % SOLN Place 1 drop into both eyes as needed. For red or dry eyes.  . carboxymethylcellulose (REFRESH TEARS) 0.5 % SOLN Place 1 drop into both eyes 2 (two) times daily.   . Cholecalciferol (VITAMIN D) 2000 UNITS tablet Take 4,000 Units by mouth daily.  Marland Kitchen guaiFENesin (TUSSIN) 100 MG/5ML liquid Take by mouth every 6 (six) hours as needed for cough (10 mL).  Marland Kitchen labetalol (NORMODYNE) 100 MG tablet Take 150 mg by mouth 2 (two) times daily. 1 tablet and 1/2 = 150 mg  . levothyroxine (SYNTHROID, LEVOTHROID) 100 MCG tablet Take 100 mcg by mouth daily before breakfast.  . lisinopril (PRINIVIL,ZESTRIL) 40 MG tablet Take 40 mg by mouth  daily.  . memantine (NAMENDA XR) 28 MG CP24 24 hr capsule Take 28 mg by mouth daily.  . methylPREDNISolone (MEDROL) 4 MG tablet Take 4 mg by mouth AC breakfast.  . Multiple Vitamins-Minerals (PRESERVISION AREDS 2) CAPS Take 1 capsule by mouth 2 (two) times daily.  . nitroGLYCERIN (NITROSTAT) 0.4 MG SL tablet Place 0.4 mg under the tongue every 5 (five) minutes as needed for chest pain. Give up to 3 times. If no relief call NP/MD.  . omeprazole (PRILOSEC) 20 MG capsule Take 20 mg by mouth daily.  . polyethylene glycol (MIRALAX / GLYCOLAX) packet Take 17 g by mouth every other day.   . protein supplement (RESOURCE BENEPROTEIN) POWD Take 1 scoop by mouth 2 (two) times daily.   Marland Kitchen pyrithione zinc (HEAD AND SHOULDERS) 1 % shampoo Apply 1 application topically 2 (two) times a week. Tues and Fri  . sennosides-docusate sodium (SENOKOT-S) 8.6-50 MG tablet Take 2 tablets by  mouth at bedtime.   . [DISCONTINUED] lisinopril (PRINIVIL,ZESTRIL) 10 MG tablet Take 20 mg by mouth daily. Take 2 tablets to equal 20 mg.   No facility-administered encounter medications on file as of 07/19/2018.     Review of Systems  Constitutional: Negative for activity change, appetite change, chills, diaphoresis, fatigue and fever.  HENT: Positive for hearing loss. Negative for congestion and voice change.   Eyes: Positive for pain.  Respiratory: Positive for cough. Negative for shortness of breath and wheezing.   Cardiovascular: Negative for chest pain, palpitations and leg swelling.  Gastrointestinal: Negative for abdominal distention, abdominal pain, constipation, diarrhea, nausea and vomiting.  Genitourinary: Negative for difficulty urinating, dysuria and urgency.  Musculoskeletal: Positive for gait problem.  Skin: Negative for color change.  Neurological: Negative for dizziness, speech difficulty, weakness and headaches.       Dementia  Psychiatric/Behavioral: Negative for agitation, behavioral problems, hallucinations and  sleep disturbance. The patient is not nervous/anxious.     Immunization History  Administered Date(s) Administered  . Influenza Whole 01/14/2018  . Influenza, High Dose Seasonal PF 12/27/2013  . Influenza-Unspecified 01/12/2013, 01/02/2015, 01/29/2016, 02/03/2017  . Pneumococcal-Unspecified 07/01/2012  . Td 05/27/2004  . Tdap 02/21/2012  . Zoster 04/15/2007   Pertinent  Health Maintenance Due  Topic Date Due  . PNA vac Low Risk Adult (2 of 2 - PCV13) 07/01/2013  . INFLUENZA VACCINE  11/13/2018  . DEXA SCAN  Completed   Fall Risk  12/01/2017 11/28/2016 05/22/2014 10/06/2013 06/02/2013  Falls in the past year? No No No No No  Risk for fall due to : - - - History of fall(s);Impaired balance/gait;Impaired mobility Impaired mobility   Functional Status Survey:    Vitals:   07/19/18 0855  BP: 130/90  Pulse: 64  Resp: 18  Temp: 97.9 F (36.6 C)  SpO2: 97%  Weight: 133 lb 4.8 oz (60.5 kg)  Height: 5\' 1"  (1.549 m)   Body mass index is 25.19 kg/m. Physical Exam Vitals signs and nursing note reviewed.  Constitutional:      General: She is not in acute distress.    Appearance: Normal appearance. She is not ill-appearing, toxic-appearing or diaphoretic.  HENT:     Head: Normocephalic and atraumatic.     Nose: Nose normal.     Mouth/Throat:     Mouth: Mucous membranes are moist.  Eyes:     Extraocular Movements: Extraocular movements intact.     Pupils: Pupils are equal, round, and reactive to light.  Neck:     Musculoskeletal: Normal range of motion and neck supple.  Cardiovascular:     Rate and Rhythm: Normal rate and regular rhythm.     Heart sounds: No murmur.  Pulmonary:     Effort: Pulmonary effort is normal.     Breath sounds: No wheezing or rales.  Abdominal:     General: There is no distension.     Palpations: Abdomen is soft.     Tenderness: There is no abdominal tenderness. There is no rebound.  Musculoskeletal:     Right lower leg: No edema.     Left lower  leg: No edema.  Skin:    General: Skin is warm and dry.  Neurological:     General: No focal deficit present.     Mental Status: She is alert. Mental status is at baseline.     Cranial Nerves: No cranial nerve deficit.     Sensory: No sensory deficit.     Motor: No weakness.  Coordination: Coordination normal.     Gait: Gait abnormal.     Comments: Oriented to self.   Psychiatric:        Mood and Affect: Mood normal.     Labs reviewed: Recent Labs    08/11/17 01/19/18 07/06/18 07/13/18  NA 141 141 140 141  K 3.4 3.9 3.2* 3.4  CL 107 105  --  107  CO2 25 27  --  25  BUN 18 25* 27* 15  CREATININE 0.8 0.8 1.1 0.7  CALCIUM 8.6 9.3  --  8.3   Recent Labs    01/19/18 07/06/18 07/13/18  AST 15 15 14   ALT 9 7 16   ALKPHOS 76 70 60  PROT 6.0  --  5.5  ALBUMIN 3.8  --  3.3   Recent Labs    08/04/17 01/19/18 07/06/18 07/13/18  WBC 9.4 9.5 21.6 11.2  NEUTROABS 6,439  --   --   --   HGB 12.4 13.7 12.6 12.2  HCT 37 40 37 36  PLT 250 275 226 294   Lab Results  Component Value Date   TSH 0.98 11/19/2017   Lab Results  Component Value Date   HGBA1C 5.8 (H) 07/06/2014   Lab Results  Component Value Date   CHOL 228 (A) 07/15/2016   HDL 55 07/15/2016   LDLCALC 146 07/15/2016   TRIG 141 07/15/2016   CHOLHDL 3.2 07/06/2014    Significant Diagnostic Results in last 30 days:  No results found.  Assessment/Plan Essential hypertension blood pressure is controlled, continue  Lisinopril 40mg  qd, Labetalol 150mg  bid.   Acute bronchitis Resolving.   GERD Stable, continue Omeprazole 20mg  qd.   Hypothyroidism Stable, continue Levothyroxine 130mcg po qd, last TSH 0.98 11/19/17  Dementia of the Alzheimer's type Continue SNF [facility] for safety and care assistance, continue Memantine 28mg  qd.   Constipation Stable, continue Senoikot S II qhs, MIraLax qod.      Family/ staff Communication: plan of care reviewed with the patient and charge nurse.   Labs/tests ordered:   none  Time spend 25 minutes.

## 2018-07-19 NOTE — Assessment & Plan Note (Signed)
Continue SNF [facility] for safety and care assistance, continue Memantine 28mg  qd.

## 2018-07-19 NOTE — Assessment & Plan Note (Signed)
Stable, continue Senoikot S II qhs, MIraLax qod.

## 2018-07-19 NOTE — Assessment & Plan Note (Signed)
blood pressure is controlled, continue  Lisinopril 40mg  qd, Labetalol 150mg  bid.

## 2018-07-19 NOTE — Assessment & Plan Note (Signed)
Stable, continue Omeprazole 20mg qd.  

## 2018-07-19 NOTE — Assessment & Plan Note (Signed)
Stable, continue Levothyroxine 155mcg po qd, last TSH 0.98 11/19/17

## 2018-07-19 NOTE — Assessment & Plan Note (Signed)
Resolving

## 2018-07-21 DIAGNOSIS — J209 Acute bronchitis, unspecified: Secondary | ICD-10-CM | POA: Diagnosis not present

## 2018-07-22 LAB — BASIC METABOLIC PANEL
BUN: 23 — AB (ref 4–21)
Creatinine: 0.7 (ref 0.5–1.1)
Glucose: 78
Potassium: 3.7 (ref 3.4–5.3)
Sodium: 141 (ref 137–147)

## 2018-08-24 ENCOUNTER — Encounter: Payer: Self-pay | Admitting: Internal Medicine

## 2018-08-24 ENCOUNTER — Non-Acute Institutional Stay (SKILLED_NURSING_FACILITY): Payer: Medicare Other | Admitting: Internal Medicine

## 2018-08-24 DIAGNOSIS — F0280 Dementia in other diseases classified elsewhere without behavioral disturbance: Secondary | ICD-10-CM | POA: Diagnosis not present

## 2018-08-24 DIAGNOSIS — G301 Alzheimer's disease with late onset: Secondary | ICD-10-CM | POA: Diagnosis not present

## 2018-08-24 DIAGNOSIS — E039 Hypothyroidism, unspecified: Secondary | ICD-10-CM

## 2018-08-24 DIAGNOSIS — I5032 Chronic diastolic (congestive) heart failure: Secondary | ICD-10-CM | POA: Diagnosis not present

## 2018-08-24 DIAGNOSIS — I10 Essential (primary) hypertension: Secondary | ICD-10-CM

## 2018-08-24 NOTE — Progress Notes (Signed)
Location:  [facility] Room Number: 42 Place of Service:  SNF (31) Provider:Gupta Anjali L,MD   Virgie Dad, MD  Patient Care Team: Virgie Dad, MD as PCP - General (Internal Medicine) Zadie Rhine Clent Demark, MD as Consulting Physician (Ophthalmology) Burnell Blanks, MD as Consulting Physician (Cardiology) Inda Castle, MD (Inactive) as Consulting Physician (Gastroenterology) Jari Pigg, MD as Consulting Physician (Dermatology) Dorna Leitz, MD as Consulting Physician (Orthopedic Surgery) Mast, Man X, NP as Nurse Practitioner (Internal Medicine)  Extended Emergency Contact Information Primary Emergency Contact: [facility] Address: [address]          [place], [place] [address] Johnnette Litter of [place] Phone: [phone] Relation: Daughter Secondary Emergency Contact: [place], [place], [place] [address] Johnnette Litter of [place] Phone: [phone] Relation: Son  Code Status: DNR  Goals of care: Advanced Directive information Advanced Directives 08/24/2018  Does Patient Have a Medical Advance Directive? Yes  Type of Advance Directive Living will;Healthcare Power of [place];Out of facility DNR (pink MOST or yellow form)  Does patient want to make changes to medical advance directive? No - Patient declined  Copy of [place] in Chart? Yes - validated most recent copy scanned in chart (See row information)  Pre-existing out of facility DNR order (yellow form or pink MOST form) Yellow form placed in chart (order not valid for inpatient use)     Chief Complaint  Patient presents with  . Medical Management of Chronic Issues    Routine visit     HPI:  Pt is a 85 y.o. female seen today for medical management of chronic diseases.   Patient has h/o Hypertension,Hypotyroidism and Dementia and gastritis. Patient is long term resident of facility She did not have any acute issues No Nursing issues I did  see that her SBP has been consistently running more then 140 Patient denies any chest pain cough, Fever Per nurses her Appetite is poor but they gave her shakes to compensate her weight is stable.  She walks with a walker sometimes but mostly dependent for her ADLs and feed herself.    Past Medical History:  Diagnosis Date  . Acute blood loss anemia 03/29/2014   03/30/14 Hgb 7.3 04/04/14 Hgb 8.5 continue Fe bid.  05/04/14 Hgb 10.8   . Benign neoplasm of colon   . Cancer ([place])    pre-melanoma  . Cystocele   . Dementia of the Alzheimer's type [facility]) 05/22/2014   05/19/14 MMSE 18/30 Namenda.    . Diastolic dysfunction 7/91/5056  . Diverticulosis of colon (without mention of hemorrhage)   . Diverticulosis of large intestine 06/28/2004  . Esophageal reflux   . Essential hypertension 07/05/2013  . Gastroparesis   . Hemorrhoids   . HTN (hypertension)   . Hyperlipidemia 07/05/2013  . Hypertension   . Hypothyroidism   . Internal hemorrhoids without mention of complication 97/12/4799  . Macular degeneration   . Nonspecific abnormal electrocardiogram (ECG) (EKG)   . Osteoarthritis    right hip  . Other and unspecified hyperlipidemia   . Prediabetes 11/17/2013  . Primary osteoarthritis of right hip 03/27/2014  . Shingles   . Skin cancer (melanoma) ([place])   . Stricture and stenosis of esophagus   . Stricture and stenosis of esophagus 10/27/2007  . Vitamin D deficiency 11/17/2013   Past Surgical History:  Procedure Laterality Date  . APPENDECTOMY    . BCC resection  legs/face  . CATARACT EXTRACTION, BILATERAL    . CHOLECYSTECTOMY    . FLEXIBLE SIGMOIDOSCOPY  05/12/2011   Procedure: FLEXIBLE SIGMOIDOSCOPY;  Surgeon: Inda Castle, MD;  [facility];  Service: Endoscopy;  Laterality: N/A;  . FLEXIBLE SIGMOIDOSCOPY  09/10/2011   Procedure: FLEXIBLE SIGMOIDOSCOPY;  Surgeon: Inda Castle, MD;  [facility];  Service: Endoscopy;  Laterality: N/A;  . FLEXIBLE SIGMOIDOSCOPY  N/A 08/16/2012   Procedure: FLEXIBLE SIGMOIDOSCOPY;  Surgeon: Inda Castle, MD;  [facility];  Service: Endoscopy;  Laterality: N/A;  . FRACTURE SURGERY     left hip pin  . GALLBLADDER SURGERY  2002  . HEMORRHOID BANDING N/A 08/16/2012   Procedure: HEMORRHOID BANDING;  Surgeon: Inda Castle, MD;  [facility];  Service: Endoscopy;  Laterality: N/A;  . HEMORRHOID SURGERY    . left eye surgery    . melenoma resection     right arm  . THYROIDECTOMY    . TONSILLECTOMY    . TOTAL HIP ARTHROPLASTY Right 03/27/2014   Procedure: TOTAL HIP ARTHROPLASTY ANTERIOR APPROACH;  Surgeon: Alta Corning, MD;  Location: [facility];  Service: Orthopedics;  Laterality: Right;    Allergies  Allergen Reactions  . Ace Inhibitors Other (See Comments)    Elevated potassium  . Statins Other (See Comments)    Myalgias   . Vicodin [Hydrocodone-Acetaminophen] Other (See Comments)    "felt spaced out"  . Fosamax [Alendronate Sodium] Other (See Comments)  . Metoclopramide Hcl Other (See Comments)  . Sulfa Antibiotics Rash  . Sulfonamide Derivatives Rash  . Vesicare [Solifenacin] Other (See Comments)    Outpatient Encounter Medications as of 08/24/2018  Medication Sig  . acetaminophen (TYLENOL) 500 MG tablet Take 500 mg by mouth every 8 (eight) hours as needed for mild pain or moderate pain.   Marland Kitchen aspirin EC 81 MG tablet Take 81 mg by mouth daily.  . calcium carbonate (CAL-GEST ANTACID) 500 MG chewable tablet Chew 1 tablet by mouth daily as needed for indigestion or heartburn.  . carboxymethylcellulose (REFRESH PLUS) 0.5 % SOLN Place 1 drop into both eyes as needed. For red or dry eyes.  . carboxymethylcellulose (REFRESH TEARS) 0.5 % SOLN Place 1 drop into both eyes 2 (two) times daily.   . Cholecalciferol (VITAMIN D) 2000 UNITS tablet Take 4,000 Units by mouth daily.  Marland Kitchen guaiFENesin (TUSSIN) 100 MG/5ML liquid Take by mouth every 6 (six) hours as needed for cough (10 mL).  Marland Kitchen labetalol  (NORMODYNE) 100 MG tablet Take 150 mg by mouth 2 (two) times daily. 1 tablet and 1/2 = 150 mg  . levothyroxine (SYNTHROID, LEVOTHROID) 100 MCG tablet Take 100 mcg by mouth daily before breakfast.  . lisinopril (PRINIVIL,ZESTRIL) 40 MG tablet Take 40 mg by mouth daily.  . memantine (NAMENDA XR) 28 MG CP24 24 hr capsule Take 28 mg by mouth daily.  . Multiple Vitamins-Minerals (PRESERVISION AREDS 2) CAPS Take 1 capsule by mouth 2 (two) times daily.  . nitroGLYCERIN (NITROSTAT) 0.4 MG SL tablet Place 0.4 mg under the tongue every 5 (five) minutes as needed for chest pain. Give up to 3 times. If no relief call NP/MD.  . omeprazole (PRILOSEC) 20 MG capsule Take 20 mg by mouth daily.  . polyethylene glycol (MIRALAX / GLYCOLAX) packet Take 17 g by mouth every other day.   . protein supplement (RESOURCE BENEPROTEIN) POWD Take 1 scoop by mouth 2 (two) times daily.   Marland Kitchen pyrithione zinc (HEAD AND SHOULDERS) 1 %  shampoo Apply 1 application topically 2 (two) times a week. Tues and Fri  . sennosides-docusate sodium (SENOKOT-S) 8.6-50 MG tablet Take 2 tablets by mouth at bedtime.    No facility-administered encounter medications on file as of 08/24/2018.     Review of Systems She was more responsive today Review of Systems  Constitutional: Negative for activity change, appetite change, chills, diaphoresis, fatigue and fever.  HENT: Negative for mouth sores, postnasal drip, rhinorrhea, sinus pain and sore throat.   Respiratory: Negative for apnea, cough, chest tightness, shortness of breath and wheezing.   Cardiovascular: Negative for chest pain, palpitations and leg swelling.  Gastrointestinal: Negative for abdominal distention, abdominal pain, constipation, diarrhea, nausea and vomiting.  Genitourinary: Negative for dysuria and frequency.  Musculoskeletal: Negative for arthralgias, joint swelling and myalgias.  Skin: Negative for rash.  Neurological: Negative for dizziness, syncope, weakness,  light-headedness and numbness.  Psychiatric/Behavioral: Negative for behavioral problems, confusion and sleep disturbance.     Immunization History  Administered Date(s) Administered  . Influenza Whole 01/14/2018  . Influenza, High Dose Seasonal PF 12/27/2013  . Influenza-Unspecified 01/12/2013, 01/02/2015, 01/29/2016, 02/03/2017  . Pneumococcal Conjugate-13 02/18/2018  . Pneumococcal-Unspecified 07/01/2012  . Td 05/27/2004  . Tdap 02/21/2012  . Zoster 04/15/2007   Pertinent  Health Maintenance Due  Topic Date Due  . PNA vac Low Risk Adult (2 of 2 - PCV13) 07/01/2013  . INFLUENZA VACCINE  11/13/2018  . DEXA SCAN  Completed   Fall Risk  12/01/2017 11/28/2016 05/22/2014 10/06/2013 06/02/2013  Falls in the past year? No No No No No  Risk for fall due to : - - - History of fall(s);Impaired balance/gait;Impaired mobility Impaired mobility   Functional Status Survey:    Vitals:   08/24/18 1211  BP: (!) 158/72  Pulse: 67  Resp: 18  Temp: 98.6 F (37 C)  SpO2: 96%  Weight: 132 lb 14.4 oz (60.3 kg)  Height: 5\' 1"  (1.549 m)   Body mass index is 25.11 kg/m. Physical Exam Constitutional: . Well-developed and well-nourished.  HENT:  Head: Normocephalic.  Mouth/Throat: Oropharynx is clear and moist.  Eyes: Pupils are equal, round, and reactive to light.  Neck: Neck supple.  Cardiovascular: Normal rate and normal heart sounds.  No murmur heard. Pulmonary/Chest: Effort normal and breath sounds normal. No respiratory distress. No wheezes. She has no rales.  Abdominal: Soft. Bowel sounds are normal. No distension. There is no tenderness. There is no rebound.  Musculoskeletal: No edema. Has a Small Bruise Lymphadenopathy: none Neurological: Does follows Commands and tries to Answer No Focal Deficits  Skin: Skin is warm and dry.  Psychiatric: Normal mood and affect. Behavior is normal. Thought content normal.   Labs reviewed: Recent Labs    01/19/18 07/06/18 07/13/18 07/22/18   NA 141 140 141 141  K 3.9 3.2* 3.4 3.7  CL 105  --  107  --   CO2 27  --  25  --   BUN 25* 27* 15 23*  CREATININE 0.8 1.1 0.7 0.7  CALCIUM 9.3  --  8.3  --    Recent Labs    01/19/18 07/06/18 07/13/18  AST 15 15 14   ALT 9 7 16   ALKPHOS 76 70 60  PROT 6.0  --  5.5  ALBUMIN 3.8  --  3.3   Recent Labs    01/19/18 07/06/18 07/13/18  WBC 9.5 21.6 11.2  HGB 13.7 12.6 12.2  HCT 40 37 36  PLT 275 226 294   Lab Results  Component Value Date   TSH 0.98 11/19/2017   Lab Results  Component Value Date   HGBA1C 5.8 (H) 07/06/2014   Lab Results  Component Value Date   CHOL 228 (A) 07/15/2016   HDL 55 07/15/2016   LDLCALC 146 07/15/2016   TRIG 141 07/15/2016   CHOLHDL 3.2 07/06/2014    Significant Diagnostic Results in last 30 days:  No results found.  Assessment/Plan Essential hypertension Will start her on Norvasc 5 mg QD Decrase the Lisinopril to 20 mg as she has h/o Hyperkalemia on ACE inhibitors Continue labetolol  Chronic diastolic heart failure  Euvolemic Needed Lasix before Will continue to moniotr  Hypothyroidism,  Repeat TSH  Late onset Alzheimer's disease  Stable on Namenda Continue Supportive Care  GERD  On Prilosec   Family/ staff Communication:   Labs/tests ordered:  BMP and CBC Total time spent in this patient care encounter was  25_  minutes; greater than 50% of the visit spent counseling patient and staff, reviewing records , Labs and coordinating care for problems addressed at this encounter.

## 2018-09-07 DIAGNOSIS — I10 Essential (primary) hypertension: Secondary | ICD-10-CM | POA: Diagnosis not present

## 2018-09-07 LAB — BASIC METABOLIC PANEL
BUN: 21 (ref 4–21)
Creatinine: 0.8 (ref 0.5–1.1)
Glucose: 89
Potassium: 3.3 — AB (ref 3.4–5.3)
Sodium: 143 (ref 137–147)

## 2018-09-07 LAB — TSH: TSH: 16.14 — AB (ref 0.41–5.90)

## 2018-09-22 ENCOUNTER — Encounter: Payer: Self-pay | Admitting: Nurse Practitioner

## 2018-09-22 ENCOUNTER — Non-Acute Institutional Stay (SKILLED_NURSING_FACILITY): Payer: Medicare Other | Admitting: Nurse Practitioner

## 2018-09-22 DIAGNOSIS — E039 Hypothyroidism, unspecified: Secondary | ICD-10-CM | POA: Diagnosis not present

## 2018-09-22 DIAGNOSIS — E876 Hypokalemia: Secondary | ICD-10-CM | POA: Diagnosis not present

## 2018-09-22 DIAGNOSIS — I10 Essential (primary) hypertension: Secondary | ICD-10-CM

## 2018-09-22 DIAGNOSIS — G301 Alzheimer's disease with late onset: Secondary | ICD-10-CM

## 2018-09-22 DIAGNOSIS — F0280 Dementia in other diseases classified elsewhere without behavioral disturbance: Secondary | ICD-10-CM

## 2018-09-22 DIAGNOSIS — K5900 Constipation, unspecified: Secondary | ICD-10-CM

## 2018-09-22 NOTE — Assessment & Plan Note (Signed)
Continue SNF [facility] for safety and care assistance, continue Memantine 28mg  qd.

## 2018-09-22 NOTE — Progress Notes (Signed)
Location:  [facility] Room Number: 51 Place of Service:  SNF (31) Provider:  Sachiko Methot Otho Darner, NP   Virgie Dad, MD  Patient Care Team: Virgie Dad, MD as PCP - General (Internal Medicine) Zadie Rhine Clent Demark, MD as Consulting Physician (Ophthalmology) Burnell Blanks, MD as Consulting Physician (Cardiology) Inda Castle, MD (Inactive) as Consulting Physician (Gastroenterology) Jari Pigg, MD as Consulting Physician (Dermatology) Dorna Leitz, MD as Consulting Physician (Orthopedic Surgery) Varsha Knock X, NP as Nurse Practitioner (Internal Medicine)  Extended Emergency Contact Information Primary Emergency Contact: [facility] Address: [address]          [place], [place] [address] Johnnette Litter of [place] Phone: [phone] Relation: Daughter Secondary Emergency Contact: Ariyonna, [place], [place] [address] Johnnette Litter of [place] Phone: [phone] Relation: Son  Code Status:  DNR Goals of care: Advanced Directive information Advanced Directives 09/22/2018  Does Patient Have a Medical Advance Directive? Yes  Type of Paramedic of [place];Living will;Out of facility DNR (pink MOST or yellow form)  Does patient want to make changes to medical advance directive? No - Patient declined  Copy of [place] in Chart? Yes - validated most recent copy scanned in chart (See row information)  Pre-existing out of facility DNR order (yellow form or pink MOST form) Yellow form placed in chart (order not valid for inpatient use)     Chief Complaint  Patient presents with  . Medical Management of Chronic Issues    Routine Visit     HPI:  Pt is a 85 y.o. female seen today for medical management of chronic diseases.    The patient has history of HTN, blood pressure is controlled, on Lisinopril 20mg  qd, Labetalol 150mg  bid, Amlodipine 5mg  qd. She resides in [facility] for safety and care  assistance, ambulates with walker, on Memantine 28mg  qd for memory. Hx of constipation, stable on Senokot S II qhs, MiraLax qod. Hypothyroidism, on Levothyroxine 174mcg qd since 09/07/18, last TSH. 16.14 09/07/18, pending TSH 8 weeks.    Past Medical History:  Diagnosis Date  . Acute blood loss anemia 03/29/2014   03/30/14 Hgb 7.3 04/04/14 Hgb 8.5 continue Fe bid.  05/04/14 Hgb 10.8   . Benign neoplasm of colon   . Cancer ([place])    pre-melanoma  . Cystocele   . Dementia of the Alzheimer's type [facility]) 05/22/2014   05/19/14 MMSE 18/30 [place].    . Diastolic dysfunction 2/72/5366  . Diverticulosis of colon (without mention of hemorrhage)   . Diverticulosis of large intestine 06/28/2004  . Esophageal reflux   . Essential hypertension 07/05/2013  . Gastroparesis   . Hemorrhoids   . HTN (hypertension)   . Hyperlipidemia 07/05/2013  . Hypertension   . Hypothyroidism   . Internal hemorrhoids without mention of complication 44/0/3474  . Macular degeneration   . Nonspecific abnormal electrocardiogram (ECG) (EKG)   . Osteoarthritis    right hip  . Other and unspecified hyperlipidemia   . Prediabetes 11/17/2013  . Primary osteoarthritis of right hip 03/27/2014  . Shingles   . Skin cancer (melanoma) ([place])   . Stricture and stenosis of esophagus   . Stricture and stenosis of esophagus 10/27/2007  . Vitamin D deficiency 11/17/2013   Past Surgical History:  Procedure Laterality Date  . APPENDECTOMY    . BCC resection     legs/face  . CATARACT EXTRACTION, BILATERAL    .  CHOLECYSTECTOMY    . FLEXIBLE SIGMOIDOSCOPY  05/12/2011   Procedure: FLEXIBLE SIGMOIDOSCOPY;  Surgeon: Inda Castle, MD;  Location: [facility];  Service: Endoscopy;  Laterality: N/A;  . FLEXIBLE SIGMOIDOSCOPY  09/10/2011   Procedure: FLEXIBLE SIGMOIDOSCOPY;  Surgeon: Inda Castle, MD;  Location: [facility];  Service: Endoscopy;  Laterality: N/A;  . FLEXIBLE SIGMOIDOSCOPY N/A 08/16/2012   Procedure: FLEXIBLE SIGMOIDOSCOPY;   Surgeon: Inda Castle, MD;  Location: [facility];  Service: Endoscopy;  Laterality: N/A;  . FRACTURE SURGERY     left hip pin  . GALLBLADDER SURGERY  2002  . HEMORRHOID BANDING N/A 08/16/2012   Procedure: HEMORRHOID BANDING;  Surgeon: Inda Castle, MD;  Location: [facility];  Service: Endoscopy;  Laterality: N/A;  . HEMORRHOID SURGERY    . left eye surgery    . melenoma resection     right arm  . THYROIDECTOMY    . TONSILLECTOMY    . TOTAL HIP ARTHROPLASTY Right 03/27/2014   Procedure: TOTAL HIP ARTHROPLASTY ANTERIOR APPROACH;  Surgeon: Alta Corning, MD;  Location: [facility];  Service: Orthopedics;  Laterality: Right;    Allergies  Allergen Reactions  . Ace Inhibitors Other (See Comments)    Elevated potassium  . Statins Other (See Comments)    Myalgias   . Vicodin [Hydrocodone-Acetaminophen] Other (See Comments)    "felt spaced out"  . Fosamax [Alendronate Sodium] Other (See Comments)  . Metoclopramide Hcl Other (See Comments)  . Sulfa Antibiotics Rash  . Sulfonamide Derivatives Rash  . Vesicare [Solifenacin] Other (See Comments)    Outpatient Encounter Medications as of 09/22/2018  Medication Sig  . acetaminophen (TYLENOL) 500 MG tablet Take 500 mg by mouth every 8 (eight) hours as needed for mild pain or moderate pain.   Marland Kitchen amLODipine (NORVASC) 5 MG tablet Take 5 mg by mouth daily.  Marland Kitchen aspirin EC 81 MG tablet Take 81 mg by mouth daily.  . calcium carbonate (CAL-GEST ANTACID) 500 MG chewable tablet Chew 1 tablet by mouth daily as needed for indigestion or heartburn.  . carboxymethylcellulose (REFRESH TEARS) 0.5 % SOLN Place 1 drop into both eyes 2 (two) times daily.   . Cholecalciferol (VITAMIN D) 2000 UNITS tablet Take 4,000 Units by mouth daily.  Marland Kitchen guaiFENesin (TUSSIN) 100 MG/5ML liquid Take by mouth every 6 (six) hours as needed for cough (10 mL).  Marland Kitchen labetalol (NORMODYNE) 100 MG tablet Take 150 mg by mouth 2 (two) times daily. 1 tablet and 1/2 = 150 mg  .  levothyroxine (SYNTHROID) 112 MCG tablet Take 112 mcg by mouth daily before breakfast.  . lisinopril (ZESTRIL) 20 MG tablet Take 20 mg by mouth daily.  . memantine (NAMENDA XR) 28 MG CP24 24 hr capsule Take 28 mg by mouth daily.  . Multiple Vitamins-Minerals (PRESERVISION AREDS 2) CAPS Take 1 capsule by mouth 2 (two) times daily.  . nitroGLYCERIN (NITROSTAT) 0.4 MG SL tablet Place 0.4 mg under the tongue every 5 (five) minutes as needed for chest pain. Give up to 3 times. If no relief call NP/MD.  . omeprazole (PRILOSEC) 20 MG capsule Take 20 mg by mouth daily.  . polyethylene glycol (MIRALAX / GLYCOLAX) packet Take 17 g by mouth every other day.   . protein supplement (RESOURCE BENEPROTEIN) POWD Take 1 scoop by mouth 2 (two) times daily.   Marland Kitchen pyrithione zinc (HEAD AND SHOULDERS) 1 % shampoo Apply 1 application topically 2 (two) times a week. Tues and Fri  . sennosides-docusate sodium (SENOKOT-S) 8.6-50  MG tablet Take 2 tablets by mouth at bedtime.   . [DISCONTINUED] carboxymethylcellulose (REFRESH PLUS) 0.5 % SOLN Place 1 drop into both eyes as needed. For red or dry eyes.  . [DISCONTINUED] levothyroxine (SYNTHROID, LEVOTHROID) 100 MCG tablet Take 100 mcg by mouth daily before breakfast.   No facility-administered encounter medications on file as of 09/22/2018.    ROS was provided with assistance of staff Review of Systems  Constitutional: Positive for unexpected weight change. Negative for activity change, appetite change, chills, diaphoresis, fatigue and fever.       Weight loss #3Ibs in the past 5-6 months, f/u dietary  HENT: Positive for hearing loss. Negative for congestion and voice change.   Respiratory: Negative for cough, shortness of breath and wheezing.   Cardiovascular: Negative for chest pain, palpitations and leg swelling.  Gastrointestinal: Negative for abdominal distention, abdominal pain, constipation, diarrhea, nausea and vomiting.  Genitourinary: Negative for difficulty  urinating, dysuria and urgency.  Musculoskeletal: Positive for arthralgias and gait problem.  Skin: Negative for color change and pallor.  Neurological: Negative for dizziness, speech difficulty, weakness and headaches.       Dementia  Psychiatric/Behavioral: Negative for agitation, behavioral problems, hallucinations and sleep disturbance. The patient is not nervous/anxious.     Immunization History  Administered Date(s) Administered  . Influenza Whole 01/14/2018  . Influenza, High Dose Seasonal PF 12/27/2013  . Influenza-Unspecified 01/12/2013, 01/02/2015, 01/29/2016, 02/03/2017  . Pneumococcal Conjugate-13 02/18/2018  . Pneumococcal-Unspecified 07/01/2012  . Td 05/27/2004  . Tdap 02/21/2012  . Zoster 04/15/2007   Pertinent  Health Maintenance Due  Topic Date Due  . INFLUENZA VACCINE  11/13/2018  . DEXA SCAN  Completed  . PNA vac Low Risk Adult  Completed   Fall Risk  12/01/2017 11/28/2016 05/22/2014 10/06/2013 06/02/2013  Falls in the past year? No No No No No  Risk for fall due to : - - - History of fall(s);Impaired balance/gait;Impaired mobility Impaired mobility   Functional Status Survey:    Vitals:   09/22/18 1037  BP: (!) 142/70  Pulse: 80  Resp: 20  Temp: 97.6 F (36.4 C)  TempSrc: Oral  SpO2: 94%  Weight: 130 lb 6.4 oz (59.1 kg)  Height: 5\' 1"  (1.549 m)   Body mass index is 24.64 kg/m. Physical Exam Vitals signs and nursing note reviewed.  Constitutional:      General: She is not in acute distress.    Appearance: Normal appearance. She is not ill-appearing, toxic-appearing or diaphoretic.  HENT:     Head: Normocephalic and atraumatic.     Nose: Nose normal.     Mouth/Throat:     Mouth: Mucous membranes are moist.  Eyes:     Extraocular Movements: Extraocular movements intact.     Conjunctiva/sclera: Conjunctivae normal.     Pupils: Pupils are equal, round, and reactive to light.  Neck:     Musculoskeletal: Normal range of motion and neck supple.   Cardiovascular:     Rate and Rhythm: Normal rate and regular rhythm.     Heart sounds: No murmur.  Pulmonary:     Effort: Pulmonary effort is normal.     Breath sounds: No wheezing, rhonchi or rales.  Abdominal:     General: Bowel sounds are normal.     Palpations: Abdomen is soft.     Tenderness: There is no abdominal tenderness. There is no right CVA tenderness, left CVA tenderness, guarding or rebound.  Musculoskeletal:     Right lower leg: No edema.  Left lower leg: No edema.     Comments: Ambulates with walker.   Skin:    General: Skin is warm and dry.  Neurological:     General: No focal deficit present.     Mental Status: She is alert. Mental status is at baseline.     Cranial Nerves: No cranial nerve deficit.     Motor: No weakness.     Coordination: Coordination normal.     Gait: Gait abnormal.     Comments: Oriented to self, follows simple directions.   Psychiatric:        Mood and Affect: Mood normal.        Behavior: Behavior normal.     Labs reviewed: Recent Labs    01/19/18  07/13/18 07/22/18 09/07/18  NA 141   < > 141 141 143  K 3.9   < > 3.4 3.7 3.3*  CL 105  --  107  --   --   CO2 27  --  25  --   --   BUN 25*   < > 15 23* 21  CREATININE 0.8   < > 0.7 0.7 0.8  CALCIUM 9.3  --  8.3  --   --    < > = values in this interval not displayed.   Recent Labs    01/19/18 07/06/18 07/13/18  AST 15 15 14   ALT 9 7 16   ALKPHOS 76 70 60  PROT 6.0  --  5.5  ALBUMIN 3.8  --  3.3   Recent Labs    01/19/18 07/06/18 07/13/18  WBC 9.5 21.6 11.2  HGB 13.7 12.6 12.2  HCT 40 37 36  PLT 275 226 294   Lab Results  Component Value Date   TSH 16.14 (A) 09/07/2018   Lab Results  Component Value Date   HGBA1C 5.8 (H) 07/06/2014   Lab Results  Component Value Date   CHOL 228 (A) 07/15/2016   HDL 55 07/15/2016   LDLCALC 146 07/15/2016   TRIG 141 07/15/2016   CHOLHDL 3.2 07/06/2014    Significant Diagnostic Results in last 30 days:   Assessment/Plan  Essential hypertension Blood pressure is controlled, continue Lisinopril 20mg  qd, Labetalol 150mg  bid, Amlodipine 5mg  qd.   Hypothyroidism Continue Levothyroxine 174mcg qd since 09/07/18, TSH 16.14 09/07/18, pending TSH 8 weeks.   Dementia of the Alzheimer's type Continue SNF [facility] for safety and care assistance, continue Memantine 28mg  qd.   Constipation Stable, continue Senokot S II qhs, MiraLax qod.   Hypokalemia Mild, last serum K 3.3 09/07/18, f/u BMP, observe.      Family/ staff Communication: plan of care reviewed with the patient and charge nurse.   Labs/tests ordered: BMP  Time spend 25 minutes.

## 2018-09-22 NOTE — Assessment & Plan Note (Signed)
Blood pressure is controlled, continue Lisinopril 20mg  qd, Labetalol 150mg  bid, Amlodipine 5mg  qd.

## 2018-09-22 NOTE — Assessment & Plan Note (Signed)
Continue Levothyroxine 116mcg qd since 09/07/18, TSH 16.14 09/07/18, pending TSH 8 weeks.

## 2018-09-22 NOTE — Assessment & Plan Note (Signed)
Stable, continue Senokot S II qhs, MiraLax qod.

## 2018-09-22 NOTE — Assessment & Plan Note (Addendum)
Mild, last serum K 3.3 09/07/18, f/u BMP, observe.

## 2018-09-23 DIAGNOSIS — E876 Hypokalemia: Secondary | ICD-10-CM | POA: Diagnosis not present

## 2018-10-05 ENCOUNTER — Encounter: Payer: Self-pay | Admitting: Nurse Practitioner

## 2018-10-05 ENCOUNTER — Non-Acute Institutional Stay (SKILLED_NURSING_FACILITY): Payer: Medicare Other | Admitting: Nurse Practitioner

## 2018-10-05 DIAGNOSIS — F0280 Dementia in other diseases classified elsewhere without behavioral disturbance: Secondary | ICD-10-CM | POA: Diagnosis not present

## 2018-10-05 DIAGNOSIS — G301 Alzheimer's disease with late onset: Secondary | ICD-10-CM

## 2018-10-05 DIAGNOSIS — K219 Gastro-esophageal reflux disease without esophagitis: Secondary | ICD-10-CM | POA: Diagnosis not present

## 2018-10-05 DIAGNOSIS — F339 Major depressive disorder, recurrent, unspecified: Secondary | ICD-10-CM | POA: Insufficient documentation

## 2018-10-05 DIAGNOSIS — E039 Hypothyroidism, unspecified: Secondary | ICD-10-CM | POA: Diagnosis not present

## 2018-10-05 DIAGNOSIS — R634 Abnormal weight loss: Secondary | ICD-10-CM | POA: Insufficient documentation

## 2018-10-05 NOTE — Assessment & Plan Note (Signed)
Pending TSH, last TSH 16/14 09/07/18, continue Levothyroxine 150mcg qd.

## 2018-10-05 NOTE — Assessment & Plan Note (Signed)
Progressing, continue SNF [facility] for safety and care assistance, continue Memantine 28mg  qd.

## 2018-10-05 NOTE — Assessment & Plan Note (Signed)
May trial of Lexapro 5mg  qd if HPOA consents. Observe.

## 2018-10-05 NOTE — Assessment & Plan Note (Signed)
Dietary f/u

## 2018-10-05 NOTE — Assessment & Plan Note (Signed)
Stable, continue Omeprazole 20mg qd.  

## 2018-10-05 NOTE — Progress Notes (Signed)
Location:  [facility] Room Number: [place] of Service:  SNF (31) Provider:  Jamielee Mchale Otho Darner, NP  Virgie Dad, MD  Patient Care Team: Virgie Dad, MD as PCP - General (Internal Medicine) Zadie Rhine Clent Demark, MD as Consulting Physician (Ophthalmology) Burnell Blanks, MD as Consulting Physician (Cardiology) Inda Castle, MD (Inactive) as Consulting Physician (Gastroenterology) Jari Pigg, MD as Consulting Physician (Dermatology) Dorna Leitz, MD as Consulting Physician (Orthopedic Surgery) Nial Hawe X, NP as Nurse Practitioner (Internal Medicine)  Extended Emergency Contact Information Primary Emergency Contact: [facility] Address: [address]          [place], [place] [address] Johnnette Litter of [place] Phone: [phone] Relation: Daughter Secondary Emergency Contact: [place], [place],  [address] Johnnette Litter of [place] Phone: [phone] Relation: Son  Code Status:  DNR Goals of care: Advanced Directive information Advanced Directives 10/05/2018  Does Patient Have a Medical Advance Directive? Yes  Type of Paramedic of [place];Living will;Out of facility DNR (pink MOST or yellow form)  Does patient want to make changes to medical advance directive? No - Patient declined  Copy of [place] in Chart? Yes - validated most recent copy scanned in chart (See row information)  Pre-existing out of facility DNR order (yellow form or pink MOST form) Yellow form placed in chart (order not valid for inpatient use)     Chief Complaint  Patient presents with  . Acute Visit    Depression    HPI:  Pt is a 85 y.o. female seen today for an acute visit for social service reported the patient's moderate depression. Gradual weight loss about #5 Ibs in the past 6 months. HPI was provided with assistance of staff. She denied upset stomach, heart burns, nausea, vomiting, constipation, abd  pain. She takes Omeprazole 20mg  qd for GERD, Memantine 28mg  qd, for memory, Levothyroxine 113mcg qd for Hypothyroidism, last TSH 16.14 09/07/18, pend TSH due to adjustment of Levothyroxine.    Past Medical History:  Diagnosis Date  . Acute blood loss anemia 03/29/2014   03/30/14 Hgb 7.3 04/04/14 Hgb 8.5 continue Fe bid.  05/04/14 Hgb 10.8   . Benign neoplasm of colon   . Cancer ([place])    pre-melanoma  . Cystocele   . Dementia of the Alzheimer's type [facility]) 05/22/2014   05/19/14 MMSE 18/30 [place].    . Diastolic dysfunction 3/42/8768  . Diverticulosis of colon (without mention of hemorrhage)   . Diverticulosis of large intestine 06/28/2004  . Esophageal reflux   . Essential hypertension 07/05/2013  . Gastroparesis   . Hemorrhoids   . HTN (hypertension)   . Hyperlipidemia 07/05/2013  . Hypertension   . Hypothyroidism   . Internal hemorrhoids without mention of complication 02/16/7261  . Macular degeneration   . Nonspecific abnormal electrocardiogram (ECG) (EKG)   . Osteoarthritis    right hip  . Other and unspecified hyperlipidemia   . Prediabetes 11/17/2013  . Primary osteoarthritis of right hip 03/27/2014  . Shingles   . Skin cancer (melanoma) ([place])   . Stricture and stenosis of esophagus   . Stricture and stenosis of esophagus 10/27/2007  . Vitamin D deficiency 11/17/2013   Past Surgical History:  Procedure Laterality Date  . APPENDECTOMY    . BCC resection     legs/face  . CATARACT EXTRACTION, BILATERAL    . CHOLECYSTECTOMY    . FLEXIBLE SIGMOIDOSCOPY  05/12/2011  Procedure: FLEXIBLE SIGMOIDOSCOPY;  Surgeon: Inda Castle, MD;  Location: [facility];  Service: Endoscopy;  Laterality: N/A;  . FLEXIBLE SIGMOIDOSCOPY  09/10/2011   Procedure: FLEXIBLE SIGMOIDOSCOPY;  Surgeon: Inda Castle, MD;  Location: [facility];  Service: Endoscopy;  Laterality: N/A;  . FLEXIBLE SIGMOIDOSCOPY N/A 08/16/2012   Procedure: FLEXIBLE SIGMOIDOSCOPY;  Surgeon: Inda Castle, MD;  Location: [facility]  ENDOSCOPY;  Service: Endoscopy;  Laterality: N/A;  . FRACTURE SURGERY     left hip pin  . GALLBLADDER SURGERY  2002  . HEMORRHOID BANDING N/A 08/16/2012   Procedure: HEMORRHOID BANDING;  Surgeon: Inda Castle, MD;  Location: [facility] ENDOSCOPY;  Service: Endoscopy;  Laterality: N/A;  . HEMORRHOID SURGERY    . left eye surgery    . melenoma resection     right arm  . THYROIDECTOMY    . TONSILLECTOMY    . TOTAL HIP ARTHROPLASTY Right 03/27/2014   Procedure: TOTAL HIP ARTHROPLASTY ANTERIOR APPROACH;  Surgeon: Alta Corning, MD;  Location: [facility];  Service: Orthopedics;  Laterality: Right;    Allergies  Allergen Reactions  . Ace Inhibitors Other (See Comments)    Elevated potassium  . Statins Other (See Comments)    Myalgias   . Vicodin [Hydrocodone-Acetaminophen] Other (See Comments)    "felt spaced out"  . Fosamax [Alendronate Sodium] Other (See Comments)  . Metoclopramide Hcl Other (See Comments)  . Sulfa Antibiotics Rash  . Sulfonamide Derivatives Rash  . Vesicare [Solifenacin] Other (See Comments)    Outpatient Encounter Medications as of 10/05/2018  Medication Sig  . acetaminophen (TYLENOL) 500 MG tablet Take 500 mg by mouth every 8 (eight) hours as needed for mild pain or moderate pain.   Marland Kitchen amLODipine (NORVASC) 5 MG tablet Take 5 mg by mouth daily.  Marland Kitchen aspirin EC 81 MG tablet Take 81 mg by mouth daily.  . calcium carbonate (CAL-GEST ANTACID) 500 MG chewable tablet Chew 1 tablet by mouth daily as needed for indigestion or heartburn.  . carboxymethylcellulose (REFRESH TEARS) 0.5 % SOLN Place 1 drop into both eyes 2 (two) times daily.   . Cholecalciferol (VITAMIN D) 2000 UNITS tablet Take 4,000 Units by mouth daily.  Marland Kitchen guaiFENesin (TUSSIN) 100 MG/5ML liquid Take by mouth every 6 (six) hours as needed for cough (10 mL).  Marland Kitchen labetalol (NORMODYNE) 100 MG tablet Take 150 mg by mouth 2 (two) times daily. 1 tablet and 1/2 = 150 mg  . levothyroxine (SYNTHROID) 112 MCG tablet Take 112 mcg by  mouth daily before breakfast.  . lisinopril (ZESTRIL) 20 MG tablet Take 20 mg by mouth daily.  . memantine (NAMENDA XR) 28 MG CP24 24 hr capsule Take 28 mg by mouth daily.  . Multiple Vitamins-Minerals (PRESERVISION AREDS 2) CAPS Take 1 capsule by mouth 2 (two) times daily.  . nitroGLYCERIN (NITROSTAT) 0.4 MG SL tablet Place 0.4 mg under the tongue every 5 (five) minutes as needed for chest pain. Give up to 3 times. If no relief call NP/MD.  . omeprazole (PRILOSEC) 20 MG capsule Take 20 mg by mouth daily.  . polyethylene glycol (MIRALAX / GLYCOLAX) packet Take 17 g by mouth every other day.   . protein supplement (RESOURCE BENEPROTEIN) POWD Take 1 scoop by mouth 2 (two) times daily.   Marland Kitchen pyrithione zinc (HEAD AND SHOULDERS) 1 % shampoo Apply 1 application topically 2 (two) times a week. Tues and Fri  . sennosides-docusate sodium (SENOKOT-S) 8.6-50 MG tablet Take 2 tablets by mouth at bedtime.  No facility-administered encounter medications on file as of 10/05/2018.    ROS was provided with assistance of staff.  Review of Systems  Constitutional: Positive for unexpected weight change. Negative for activity change, appetite change, chills, diaphoresis, fatigue and fever.  HENT: Positive for hearing loss. Negative for congestion and voice change.   Respiratory: Negative for cough, shortness of breath and wheezing.   Cardiovascular: Negative for chest pain, palpitations and leg swelling.  Gastrointestinal: Negative for abdominal distention, abdominal pain, diarrhea, nausea and vomiting.  Genitourinary: Negative for difficulty urinating, dysuria and urgency.  Musculoskeletal: Positive for arthralgias and gait problem.  Skin: Negative for color change and pallor.  Neurological: Negative for dizziness, facial asymmetry, speech difficulty, weakness and headaches.       Dementia, ambulates with walker.   Psychiatric/Behavioral: Positive for dysphoric mood. Negative for agitation, behavioral problems,  hallucinations and sleep disturbance. The patient is not nervous/anxious.     Immunization History  Administered Date(s) Administered  . Influenza Whole 01/14/2018  . Influenza, High Dose Seasonal PF 12/27/2013  . Influenza-Unspecified 01/12/2013, 01/02/2015, 01/29/2016, 02/03/2017  . Pneumococcal Conjugate-13 02/18/2018  . Pneumococcal-Unspecified 07/01/2012  . Td 05/27/2004  . Tdap 02/21/2012  . Zoster 04/15/2007   Pertinent  Health Maintenance Due  Topic Date Due  . INFLUENZA VACCINE  11/13/2018  . DEXA SCAN  Completed  . PNA vac Low Risk Adult  Completed   Fall Risk  12/01/2017 11/28/2016 05/22/2014 10/06/2013 06/02/2013  Falls in the past year? No No No No No  Risk for fall due to : - - - History of fall(s);Impaired balance/gait;Impaired mobility Impaired mobility   Functional Status Survey:    Vitals:   10/05/18 1035  BP: 138/72  Pulse: 77  Resp: 20  Temp: 97.7 F (36.5 C)  TempSrc: Oral  SpO2: 95%  Weight: 130 lb 6.4 oz (59.1 kg)  Height: 5\' 1"  (1.549 m)   Body mass index is 24.64 kg/m. Physical Exam Vitals signs and nursing note reviewed.  Constitutional:      General: She is not in acute distress.    Appearance: Normal appearance. She is normal weight. She is not ill-appearing, toxic-appearing or diaphoretic.  HENT:     Head: Normocephalic and atraumatic.     Nose: Nose normal. No congestion.     Mouth/Throat:     Mouth: Mucous membranes are moist.  Eyes:     Extraocular Movements: Extraocular movements intact.     Conjunctiva/sclera: Conjunctivae normal.     Pupils: Pupils are equal, round, and reactive to light.  Neck:     Musculoskeletal: Normal range of motion and neck supple.  Cardiovascular:     Rate and Rhythm: Normal rate and regular rhythm.     Heart sounds: No murmur.  Pulmonary:     Effort: Pulmonary effort is normal.     Breath sounds: No wheezing, rhonchi or rales.  Chest:     Chest wall: No tenderness.  Abdominal:     General: Bowel  sounds are normal. There is no distension.     Palpations: Abdomen is soft.     Tenderness: There is no abdominal tenderness. There is no right CVA tenderness, left CVA tenderness, guarding or rebound.  Musculoskeletal:     Right lower leg: No edema.     Left lower leg: No edema.     Comments: Ambulates with walker.   Skin:    General: Skin is warm and dry.  Neurological:     General: No focal deficit  present.     Mental Status: She is alert. Mental status is at baseline.     Cranial Nerves: No cranial nerve deficit.     Motor: No weakness.     Coordination: Coordination normal.     Gait: Gait abnormal.     Comments: Oriented to self and her room on unit.   Psychiatric:        Attention and Perception: She does not perceive auditory or visual hallucinations.        Mood and Affect: Mood is depressed. Mood is not anxious. Affect is flat. Affect is not angry or tearful.        Speech: Speech is not rapid and pressured or delayed.        Behavior: Behavior is slowed and withdrawn. Behavior is not agitated or aggressive.        Thought Content: Thought content is not paranoid or delusional. Thought content does not include suicidal ideation.        Cognition and Memory: Cognition is impaired. Memory is impaired.     Labs reviewed: Recent Labs    01/19/18  07/13/18 07/22/18 09/07/18  NA 141   < > 141 141 143  K 3.9   < > 3.4 3.7 3.3*  CL 105  --  107  --   --   CO2 27  --  25  --   --   BUN 25*   < > 15 23* 21  CREATININE 0.8   < > 0.7 0.7 0.8  CALCIUM 9.3  --  8.3  --   --    < > = values in this interval not displayed.   Recent Labs    01/19/18 07/06/18 07/13/18  AST 15 15 14   ALT 9 7 16   ALKPHOS 76 70 60  PROT 6.0  --  5.5  ALBUMIN 3.8  --  3.3   Recent Labs    01/19/18 07/06/18 07/13/18  WBC 9.5 21.6 11.2  HGB 13.7 12.6 12.2  HCT 40 37 36  PLT 275 226 294   Lab Results  Component Value Date   TSH 16.14 (A) 09/07/2018   Lab Results  Component Value Date    HGBA1C 5.8 (H) 07/06/2014   Lab Results  Component Value Date   CHOL 228 (A) 07/15/2016   HDL 55 07/15/2016   LDLCALC 146 07/15/2016   TRIG 141 07/15/2016   CHOLHDL 3.2 07/06/2014    Significant Diagnostic Results in last 30 days:   Assessment/Plan Depression, recurrent ([place]) May trial of Lexapro 5mg  qd if HPOA consents. Observe.   Weight loss Dietary f/u  GERD Stable, continue Omeprazole 20mg  qd.   Hypothyroidism Pending TSH, last TSH 16/14 09/07/18, continue Levothyroxine 146mcg qd.   Dementia of the Alzheimer's type Progressing, continue SNF [facility] for safety and care assistance, continue Memantine 28mg  qd.      Family/ staff Communication: plan of care reviewed with the patient and charge nurse.   Labs/tests ordered: none  Time spend 25 minutes.

## 2018-10-07 DIAGNOSIS — I10 Essential (primary) hypertension: Secondary | ICD-10-CM | POA: Diagnosis not present

## 2018-10-26 DIAGNOSIS — Z1159 Encounter for screening for other viral diseases: Secondary | ICD-10-CM | POA: Diagnosis not present

## 2018-10-29 ENCOUNTER — Non-Acute Institutional Stay (SKILLED_NURSING_FACILITY): Payer: Medicare Other | Admitting: Nurse Practitioner

## 2018-10-29 ENCOUNTER — Encounter: Payer: Self-pay | Admitting: Nurse Practitioner

## 2018-10-29 DIAGNOSIS — K5900 Constipation, unspecified: Secondary | ICD-10-CM

## 2018-10-29 DIAGNOSIS — F339 Major depressive disorder, recurrent, unspecified: Secondary | ICD-10-CM | POA: Diagnosis not present

## 2018-10-29 DIAGNOSIS — I10 Essential (primary) hypertension: Secondary | ICD-10-CM | POA: Diagnosis not present

## 2018-10-29 DIAGNOSIS — G301 Alzheimer's disease with late onset: Secondary | ICD-10-CM | POA: Diagnosis not present

## 2018-10-29 DIAGNOSIS — F0280 Dementia in other diseases classified elsewhere without behavioral disturbance: Secondary | ICD-10-CM | POA: Diagnosis not present

## 2018-10-29 DIAGNOSIS — K219 Gastro-esophageal reflux disease without esophagitis: Secondary | ICD-10-CM

## 2018-10-29 DIAGNOSIS — R634 Abnormal weight loss: Secondary | ICD-10-CM

## 2018-10-29 DIAGNOSIS — E039 Hypothyroidism, unspecified: Secondary | ICD-10-CM

## 2018-10-29 MED ORDER — ASPIRIN EC 81 MG PO TBEC: 81.0000 mg | DELAYED_RELEASE_TABLET | Freq: Every day | ORAL | 1 refills | Status: AC

## 2018-10-29 MED ORDER — POLYETHYLENE GLYCOL 3350 17 G PO PACK: 17.0000 g | PACK | ORAL | 2 refills | Status: AC

## 2018-10-29 MED ORDER — LISINOPRIL 20 MG PO TABS: 20.0000 mg | ORAL_TABLET | Freq: Every day | ORAL | 3 refills | Status: AC

## 2018-10-29 MED ORDER — MEMANTINE HCL ER 28 MG PO CP24: 28.0000 mg | ORAL_CAPSULE | Freq: Every day | ORAL | 1 refills | Status: AC

## 2018-10-29 MED ORDER — LEVOTHYROXINE SODIUM 112 MCG PO TABS: 112.0000 ug | ORAL_TABLET | Freq: Every day | ORAL | 1 refills | Status: AC

## 2018-10-29 MED ORDER — SENNA-DOCUSATE SODIUM 8.6-50 MG PO TABS: 2.0000 | ORAL_TABLET | Freq: Every day | ORAL | 1 refills | Status: AC

## 2018-10-29 MED ORDER — NITROGLYCERIN 0.4 MG SL SUBL: 0.4000 mg | SUBLINGUAL_TABLET | SUBLINGUAL | 1 refills | Status: AC | PRN

## 2018-10-29 MED ORDER — NITROGLYCERIN 0.4 MG SL SUBL: 0.4000 mg | SUBLINGUAL_TABLET | SUBLINGUAL | 3 refills | Status: AC | PRN

## 2018-10-29 MED ORDER — OMEPRAZOLE 20 MG PO CPDR: 20.0000 mg | DELAYED_RELEASE_CAPSULE | Freq: Every day | ORAL | 1 refills | Status: AC

## 2018-10-29 MED ORDER — ESCITALOPRAM OXALATE 5 MG PO TABS: 5.0000 mg | ORAL_TABLET | Freq: Every day | ORAL | 1 refills | Status: AC

## 2018-10-29 MED ORDER — GUAIFENESIN 100 MG/5ML PO LIQD: 100.0000 mg | Freq: Four times a day (QID) | ORAL | 1 refills | Status: AC | PRN

## 2018-10-29 MED ORDER — LABETALOL HCL 100 MG PO TABS: 150.0000 mg | ORAL_TABLET | Freq: Two times a day (BID) | ORAL | 1 refills | Status: AC

## 2018-10-29 MED ORDER — VITAMIN D 50 MCG (2000 UT) PO TABS: 4000.0000 [IU] | ORAL_TABLET | Freq: Every day | ORAL | 1 refills | Status: AC

## 2018-10-29 MED ORDER — CALCIUM CARBONATE ANTACID 500 MG PO CHEW: 1.0000 | CHEWABLE_TABLET | Freq: Every day | ORAL | 1 refills | Status: AC | PRN

## 2018-10-29 MED ORDER — ACETAMINOPHEN 500 MG PO TABS: 500.0000 mg | ORAL_TABLET | Freq: Three times a day (TID) | ORAL | 1 refills | Status: AC | PRN

## 2018-10-29 MED ORDER — AMLODIPINE BESYLATE 5 MG PO TABS: 5.0000 mg | ORAL_TABLET | Freq: Every day | ORAL | 1 refills | Status: AC

## 2018-10-29 MED ORDER — REFRESH TEARS 0.5 % OP SOLN: 1.0000 [drp] | Freq: Two times a day (BID) | OPHTHALMIC | 2 refills | Status: AC

## 2018-10-31 ENCOUNTER — Encounter: Payer: Self-pay | Admitting: Nurse Practitioner

## 2018-10-31 NOTE — Progress Notes (Signed)
Location:   [facility] Room Number: 75 Place of Service:  SNF (31)  Provider: Marlana Latus NP  PCP: Virgie Dad, MD Patient Care Team: Virgie Dad, MD as PCP - General (Internal Medicine) Zadie Rhine Clent Demark, MD as Consulting Physician (Ophthalmology) Burnell Blanks, MD as Consulting Physician (Cardiology) Inda Castle, MD (Inactive) as Consulting Physician (Gastroenterology) Jari Pigg, MD as Consulting Physician (Dermatology) Dorna Leitz, MD as Consulting Physician (Orthopedic Surgery) Keayra Graham X, NP as Nurse Practitioner (Internal Medicine)  Extended Emergency Contact Information Primary Emergency Contact: [facility] Address: [address]          [place], [place] [address] Johnnette Litter of [place] Phone: [phone] Relation: Daughter Secondary Emergency Contact: [place], [place],  [address] Johnnette Litter of [place] Phone: [phone] Relation: Son  Code Status: DNR Goals of care:  Advanced Directive information Advanced Directives 10/29/2018  Does Patient Have a Medical Advance Directive? Yes  Type of Paramedic of [place];Living will;Out of facility DNR (pink MOST or yellow form)  Does patient want to make changes to medical advance directive? No - Patient declined  Copy of [place] in Chart? Yes - validated most recent copy scanned in chart (See row information)  Pre-existing out of facility DNR order (yellow form or pink MOST form) Yellow form placed in chart (order not valid for inpatient use)     Allergies  Allergen Reactions  . Ace Inhibitors Other (See Comments)    Elevated potassium  . Statins Other (See Comments)    Myalgias   . Vicodin [Hydrocodone-Acetaminophen] Other (See Comments)    "felt spaced out"  . Fosamax [Alendronate Sodium] Other (See Comments)  . Metoclopramide Hcl Other (See Comments)  . Sulfa Antibiotics Rash  . Sulfonamide Derivatives Rash   . Vesicare [Solifenacin] Other (See Comments)    Chief Complaint  Patient presents with  . Discharge Note    D/C    HPI:  85 y.o. female with history of hypertension, dementia, GERD, constipation, hypothyroidism, depression/anxiety, resides in [facility] for safety and care assistance, ambulates with walker on unit is discharged due to relocation to [facility] near her family.   Her blood pressures is controlled, takes  Lisinopril 20mg  qd, Labetalol 150mg  bid, Amlodipine 5mg  qd. Hypothyroidism, last TSH 16.14 09/07/18, levothyroxine was increased to 18mcg qd, pending f/u TSH. Constipation, stable on Senokot S II qhs, MiraLax qod. GERD, stable, on Omeprazole 20mg  qd. She takes Memantine 28mg  qd to preserve memory. Her mood is stable on Escitalopram 5mg  qd.       Past Medical History:  Diagnosis Date  . Acute blood loss anemia 03/29/2014   03/30/14 Hgb 7.3 04/04/14 Hgb 8.5 continue Fe bid.  05/04/14 Hgb 10.8   . Benign neoplasm of colon   . Cancer ([place])    pre-melanoma  . Cystocele   . Dementia of the Alzheimer's type [facility]) 05/22/2014   05/19/14 MMSE 18/30 [place].    . Diastolic dysfunction 0/93/8182  . Diverticulosis of colon (without mention of hemorrhage)   . Diverticulosis of large intestine 06/28/2004  . Esophageal reflux   . Essential hypertension 07/05/2013  . Gastroparesis   . Hemorrhoids   . HTN (hypertension)   . Hyperlipidemia 07/05/2013  . Hypertension   . Hypothyroidism   . Internal hemorrhoids without mention of complication 99/06/7167  . Macular degeneration   . Nonspecific abnormal electrocardiogram (ECG) (EKG)   .  Osteoarthritis    right hip  . Other and unspecified hyperlipidemia   . Prediabetes 11/17/2013  . Primary osteoarthritis of right hip 03/27/2014  . Shingles   . Skin cancer (melanoma) ([place])   . Stricture and stenosis of esophagus   . Stricture and stenosis of esophagus 10/27/2007  . Vitamin D deficiency 11/17/2013    Past Surgical History:   Procedure Laterality Date  . APPENDECTOMY    . BCC resection     legs/face  . CATARACT EXTRACTION, BILATERAL    . CHOLECYSTECTOMY    . FLEXIBLE SIGMOIDOSCOPY  05/12/2011   Procedure: FLEXIBLE SIGMOIDOSCOPY;  Surgeon: Inda Castle, MD;  Location: [facility];  Service: Endoscopy;  Laterality: N/A;  . FLEXIBLE SIGMOIDOSCOPY  09/10/2011   Procedure: FLEXIBLE SIGMOIDOSCOPY;  Surgeon: Inda Castle, MD;  Location: [facility];  Service: Endoscopy;  Laterality: N/A;  . FLEXIBLE SIGMOIDOSCOPY N/A 08/16/2012   Procedure: FLEXIBLE SIGMOIDOSCOPY;  Surgeon: Inda Castle, MD;  Location: [facility];  Service: Endoscopy;  Laterality: N/A;  . FRACTURE SURGERY     left hip pin  . GALLBLADDER SURGERY  2002  . HEMORRHOID BANDING N/A 08/16/2012   Procedure: HEMORRHOID BANDING;  Surgeon: Inda Castle, MD;  Location: [facility];  Service: Endoscopy;  Laterality: N/A;  . HEMORRHOID SURGERY    . left eye surgery    . melenoma resection     right arm  . THYROIDECTOMY    . TONSILLECTOMY    . TOTAL HIP ARTHROPLASTY Right 03/27/2014   Procedure: TOTAL HIP ARTHROPLASTY ANTERIOR APPROACH;  Surgeon: Alta Corning, MD;  Location: [facility];  Service: Orthopedics;  Laterality: Right;      reports that she has never smoked. She has never used smokeless tobacco. She reports that she does not drink alcohol or use drugs. Social History   Socioeconomic History  . Marital status: Widowed    Spouse name: Not on file  . Number of children: 83  . Years of education: Not on file  . Highest education level: Not on file  Occupational History  . Occupation: retired Marine scientist  Social Needs  . Financial resource strain: Not hard at all  . Food insecurity    Worry: Never true    Inability: Never true  . Transportation needs    Medical: No    Non-medical: No  Tobacco Use  . Smoking status: Never Smoker  . Smokeless tobacco: Never Used  Substance and Sexual Activity  . Alcohol use: No  . Drug use: No  . Sexual  activity: Never  Lifestyle  . Physical activity    Days per week: 0 days    Minutes per session: 0 min  . Stress: Not at all  Relationships  . Social Herbalist on phone: Never    Gets together: Once a week    Attends religious service: Never    Active member of club or organization: No    Attends meetings of clubs or organizations: Never    Relationship status: Widowed  . Intimate partner violence    Fear of current or ex partner: No    Emotionally abused: No    Physically abused: No    Forced sexual activity: No  Other Topics Concern  . 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    Pt does not get regular exercise      Functional Status Survey:    Allergies  Allergen Reactions  . Ace Inhibitors Other (See Comments)    Elevated potassium  . Statins Other (See Comments)    Myalgias   . Vicodin [Hydrocodone-Acetaminophen] Other (See Comments)    "felt spaced out"  . Fosamax [Alendronate Sodium] Other (See Comments)  . Metoclopramide Hcl Other (See Comments)  . Sulfa Antibiotics Rash  . Sulfonamide Derivatives Rash  . Vesicare [Solifenacin] Other (See Comments)    Pertinent  Health Maintenance Due  Topic Date Due  . INFLUENZA VACCINE  11/13/2018  . DEXA SCAN  Completed  . PNA vac Low Risk Adult  Completed    Medications: Allergies as of 10/29/2018      Reactions   Ace Inhibitors Other (See Comments)   Elevated potassium   Statins Other (See Comments)   Myalgias   Vicodin [hydrocodone-acetaminophen] Other (See Comments)   "felt spaced out"   Fosamax [alendronate Sodium] Other (See Comments)   Metoclopramide Hcl Other (See Comments)   Sulfa Antibiotics Rash   Sulfonamide Derivatives Rash   Vesicare [solifenacin] Other (See Comments)      Medication List       Accurate as of October 29, 2018 11:59 PM. If you have any questions, ask your nurse or doctor.        acetaminophen 500 MG tablet Commonly known as: TYLENOL Take 1 tablet (500 mg total) by mouth every 8 (eight) hours as needed for mild pain or moderate pain.   amLODipine 5 MG tablet Commonly known as: NORVASC Take 1 tablet (5 mg total) by mouth daily.   aspirin EC 81 MG tablet Take 1 tablet (81 mg total) by mouth daily.   calcium carbonate 500 MG chewable tablet Commonly known as: Cal-Gest Antacid Chew 1 tablet (200 mg of elemental calcium total) by mouth daily as needed for indigestion or heartburn.   escitalopram 5 MG tablet Commonly known as: LEXAPRO Take 1 tablet (5 mg total) by mouth daily.   guaiFENesin 100 MG/5ML liquid Commonly known as: Tussin Take 5 mLs (100 mg total) by mouth every 6 (six) hours as needed for cough (10 mL). What changed: how much to take Changed by: Wynter Isaacs X Oreta Soloway, NP   labetalol 100 MG tablet Commonly known as: NORMODYNE Take 1.5 tablets (150 mg total) by mouth 2 (two) times daily. 1 tablet and 1/2 = 150 mg   levothyroxine 112 MCG tablet Commonly known as: SYNTHROID Take 1 tablet (112 mcg total) by mouth daily before breakfast.   lisinopril 20 MG tablet Commonly known as: ZESTRIL Take 20 mg by mouth daily. What changed: Another medication with the same name was added. Make sure you understand how and when to take each. Changed by: Jmya Uliano X Marq Rebello, NP   lisinopril 20 MG tablet Commonly known as: ZESTRIL Take 1 tablet (20 mg total) by mouth daily. What changed: You were already taking a medication with the same name, and this prescription was added. Make sure you understand how and when to take each. Changed by: Brentin Shin X Kimani Bedoya, NP   memantine 28 MG Cp24 24 hr capsule Commonly known as: NAMENDA XR Take 28 mg by mouth daily. What changed: Another medication with the same name was added. Make sure you understand how and when to take each. Changed by: Talibah Colasurdo X Jaelin Fackler, NP   memantine  28 MG Cp24 24 hr  capsule Commonly known as: NAMENDA XR Take 1 capsule (28 mg total) by mouth daily. What changed: You were already taking a medication with the same name, and this prescription was added. Make sure you understand how and when to take each. Changed by: Braidyn Scorsone X Emmanuelle Hibbitts, NP   nitroGLYCERIN 0.4 MG SL tablet Commonly known as: NITROSTAT Place 1 tablet (0.4 mg total) under the tongue every 5 (five) minutes as needed for chest pain. What changed: You were already taking a medication with the same name, and this prescription was added. Make sure you understand how and when to take each. Changed by: Dequon Schnebly X Regnia Mathwig, NP   nitroGLYCERIN 0.4 MG SL tablet Commonly known as: NITROSTAT Place 1 tablet (0.4 mg total) under the tongue every 5 (five) minutes as needed for chest pain. Give up to 3 times. If no relief call NP/MD. What changed: Another medication with the same name was added. Make sure you understand how and when to take each. Changed by: Ashiyah Pavlak X Jaelie Aguilera, NP   omeprazole 20 MG capsule Commonly known as: PRILOSEC Take 1 capsule (20 mg total) by mouth daily.   polyethylene glycol 17 g packet Commonly known as: MIRALAX / GLYCOLAX Take 17 g by mouth every other day.   PreserVision AREDS 2 Caps Take 1 capsule by mouth 2 (two) times daily.   protein supplement Powd Take 1 scoop by mouth 2 (two) times daily.   pyrithione zinc 1 % shampoo Commonly known as: HEAD AND SHOULDERS Apply 1 application topically 2 (two) times a week. Tues and Fri   Refresh Tears 0.5 % Soln Generic drug: carboxymethylcellulose Place 1 drop into both eyes 2 (two) times daily.   sennosides-docusate sodium 8.6-50 MG tablet Commonly known as: SENOKOT-S Take 2 tablets by mouth at bedtime.   Vitamin D 50 MCG (2000 UT) tablet Take 2 tablets (4,000 Units total) by mouth daily.      ROS was provided with assistance of staff Review of Systems  Constitutional: Positive for unexpected weight change. Negative for  activity change, appetite change, chills, diaphoresis, fatigue and fever.       Gradual weight loss, about #2Ibs in the past month.   HENT: Positive for hearing loss. Negative for congestion and voice change.   Eyes: Negative for visual disturbance.  Respiratory: Negative for cough, shortness of breath and wheezing.   Cardiovascular: Negative for chest pain, palpitations and leg swelling.  Gastrointestinal: Negative for abdominal distention, abdominal pain, constipation and vomiting.  Genitourinary: Negative for difficulty urinating, dysuria and urgency.  Musculoskeletal: Positive for gait problem.  Skin: Negative for color change.  Neurological: Negative for dizziness, speech difficulty, weakness and headaches.       Dementia  Psychiatric/Behavioral: Negative for agitation, behavioral problems and hallucinations. The patient is not nervous/anxious.     Vitals:   10/29/18 1649  BP: 122/78  Pulse: 68  Resp: 16  Temp: (!) 96.2 F (35.7 C)  SpO2: 96%  Weight: 128 lb (58.1 kg)  Height: 5\' 1"  (1.549 m)   Body mass index is 24.19 kg/m. Physical Exam Vitals signs and nursing note reviewed.  Constitutional:      General: She is not in acute distress.    Appearance: Normal appearance. She is normal weight. She is not ill-appearing, toxic-appearing or diaphoretic.  HENT:     Head: Normocephalic and atraumatic.     Nose: Nose normal.     Mouth/Throat:     Mouth: Mucous membranes are moist.  Eyes:  Extraocular Movements: Extraocular movements intact.     Conjunctiva/sclera: Conjunctivae normal.     Pupils: Pupils are equal, round, and reactive to light.  Neck:     Musculoskeletal: Normal range of motion and neck supple.  Cardiovascular:     Rate and Rhythm: Normal rate and regular rhythm.     Heart sounds: No murmur.  Pulmonary:     Effort: Pulmonary effort is normal.     Breath sounds: No wheezing, rhonchi or rales.  Abdominal:     General: Bowel sounds are normal. There is  no distension.     Palpations: Abdomen is soft.     Tenderness: There is no abdominal tenderness. There is no right CVA tenderness, left CVA tenderness, guarding or rebound.  Musculoskeletal:     Right lower leg: No edema.     Left lower leg: No edema.     Comments: Ambulates with walker  Skin:    General: Skin is warm and dry.  Neurological:     General: No focal deficit present.     Mental Status: She is alert. Mental status is at baseline.     Cranial Nerves: No cranial nerve deficit.     Motor: No weakness.     Coordination: Coordination normal.     Gait: Gait abnormal.     Comments: Oriented to self and room on unit.   Psychiatric:        Mood and Affect: Mood normal.        Behavior: Behavior normal.     Labs reviewed: Basic Metabolic Panel: Recent Labs    01/19/18  07/13/18 07/22/18 09/07/18  NA 141   < > 141 141 143  K 3.9   < > 3.4 3.7 3.3*  CL 105  --  107  --   --   CO2 27  --  25  --   --   BUN 25*   < > 15 23* 21  CREATININE 0.8   < > 0.7 0.7 0.8  CALCIUM 9.3  --  8.3  --   --    < > = values in this interval not displayed.   Liver Function Tests: Recent Labs    01/19/18 07/06/18 07/13/18  AST 15 15 14   ALT 9 7 16   ALKPHOS 76 70 60  PROT 6.0  --  5.5  ALBUMIN 3.8  --  3.3   No results for input(s): LIPASE, AMYLASE in the last 8760 hours. No results for input(s): AMMONIA in the last 8760 hours. CBC: Recent Labs    01/19/18 07/06/18 07/13/18  WBC 9.5 21.6 11.2  HGB 13.7 12.6 12.2  HCT 40 37 36  PLT 275 226 294   Cardiac Enzymes: No results for input(s): CKTOTAL, CKMB, CKMBINDEX, TROPONINI in the last 8760 hours. BNP: Invalid input(s): POCBNP CBG: No results for input(s): GLUCAP in the last 8760 hours.  Procedures and Imaging Studies During Stay: No results found.  Assessment/Plan:   Essential hypertension blood pressures is controlled, continue  Lisinopril 20mg  qd, Labetalol 150mg  bid, Amlodipine 5mg  qd.   GERD Stable, continue  Omeprazole 20mg  qd.   Hypothyroidism  last TSH 16.14 09/07/18, levothyroxine was increased to 141mcg qd, pending f/u TSH.  Dementia of the Alzheimer's type The patient is able to ambulate with walker, needs close supervision for ADLs, continue Memantine 28mg  qd.   Constipation  Stable, continue  Senokot S II qhs, MiraLax qod.   Depression, recurrent ([place]) Her mood is stable, continue Escitalopram  5mg  qd.   Weight loss Gradual, weight loss # 2Ibs in the past month, will continue encourage oral intake, nutritional supplement.    Patient is being discharged with the following home health services:    Patient is being discharged with the following durable medical equipment:    Patient has been advised to f/u with their PCP in 1-2 weeks to bring them up to date on their rehab stay.  Social services at facility was responsible for arranging this appointment.  Pt was provided with a 30 day supply of prescriptions for medications and refills must be obtained from their PCP.  For controlled substances, a more limited supply may be provided adequate until PCP appointment only.  Future labs/tests needed: need f/u TSH

## 2018-10-31 NOTE — Assessment & Plan Note (Signed)
Gradual, weight loss # 2Ibs in the past month, will continue encourage oral intake, nutritional supplement.

## 2018-10-31 NOTE — Assessment & Plan Note (Signed)
The patient is able to ambulate with walker, needs close supervision for ADLs, continue Memantine 28mg  qd.

## 2018-10-31 NOTE — Assessment & Plan Note (Signed)
Stable, continue  Senokot S II qhs, MiraLax qod.

## 2018-10-31 NOTE — Assessment & Plan Note (Signed)
Stable, continue Omeprazole 20mg qd.  

## 2018-10-31 NOTE — Assessment & Plan Note (Signed)
last TSH 16.14 09/07/18, levothyroxine was increased to 115mcg qd, pending f/u TSH.

## 2018-10-31 NOTE — Assessment & Plan Note (Signed)
blood pressures is controlled, continue  Lisinopril 20mg  qd, Labetalol 150mg  bid, Amlodipine 5mg  qd.

## 2018-10-31 NOTE — Assessment & Plan Note (Signed)
Her mood is stable, continue Escitalopram 5mg  qd.

## 2018-11-02 DIAGNOSIS — E039 Hypothyroidism, unspecified: Secondary | ICD-10-CM | POA: Diagnosis not present

## 2018-11-10 DIAGNOSIS — L03115 Cellulitis of right lower limb: Secondary | ICD-10-CM | POA: Diagnosis not present

## 2019-01-05 DIAGNOSIS — R159 Full incontinence of feces: Secondary | ICD-10-CM | POA: Diagnosis not present

## 2019-01-05 DIAGNOSIS — I10 Essential (primary) hypertension: Secondary | ICD-10-CM | POA: Diagnosis not present

## 2019-01-05 DIAGNOSIS — K219 Gastro-esophageal reflux disease without esophagitis: Secondary | ICD-10-CM | POA: Diagnosis not present

## 2019-01-05 DIAGNOSIS — E441 Mild protein-calorie malnutrition: Secondary | ICD-10-CM | POA: Diagnosis not present

## 2019-01-05 DIAGNOSIS — K5904 Chronic idiopathic constipation: Secondary | ICD-10-CM | POA: Diagnosis not present

## 2019-01-05 DIAGNOSIS — R32 Unspecified urinary incontinence: Secondary | ICD-10-CM | POA: Diagnosis not present

## 2019-01-05 DIAGNOSIS — F0280 Dementia in other diseases classified elsewhere without behavioral disturbance: Secondary | ICD-10-CM | POA: Diagnosis not present

## 2019-01-05 DIAGNOSIS — Z66 Do not resuscitate: Secondary | ICD-10-CM | POA: Diagnosis not present

## 2019-01-05 DIAGNOSIS — F329 Major depressive disorder, single episode, unspecified: Secondary | ICD-10-CM | POA: Diagnosis not present

## 2019-01-05 DIAGNOSIS — M1990 Unspecified osteoarthritis, unspecified site: Secondary | ICD-10-CM | POA: Diagnosis not present

## 2019-01-05 DIAGNOSIS — S81801D Unspecified open wound, right lower leg, subsequent encounter: Secondary | ICD-10-CM | POA: Diagnosis not present

## 2019-01-05 DIAGNOSIS — L03115 Cellulitis of right lower limb: Secondary | ICD-10-CM | POA: Diagnosis not present

## 2019-01-05 DIAGNOSIS — H353190 Nonexudative age-related macular degeneration, unspecified eye, stage unspecified: Secondary | ICD-10-CM | POA: Diagnosis not present

## 2019-01-05 DIAGNOSIS — G301 Alzheimer's disease with late onset: Secondary | ICD-10-CM | POA: Diagnosis not present

## 2019-01-06 DIAGNOSIS — F0280 Dementia in other diseases classified elsewhere without behavioral disturbance: Secondary | ICD-10-CM | POA: Diagnosis not present

## 2019-01-06 DIAGNOSIS — E441 Mild protein-calorie malnutrition: Secondary | ICD-10-CM | POA: Diagnosis not present

## 2019-01-06 DIAGNOSIS — L03115 Cellulitis of right lower limb: Secondary | ICD-10-CM | POA: Diagnosis not present

## 2019-01-06 DIAGNOSIS — S81801D Unspecified open wound, right lower leg, subsequent encounter: Secondary | ICD-10-CM | POA: Diagnosis not present

## 2019-01-06 DIAGNOSIS — G301 Alzheimer's disease with late onset: Secondary | ICD-10-CM | POA: Diagnosis not present

## 2019-01-06 DIAGNOSIS — I10 Essential (primary) hypertension: Secondary | ICD-10-CM | POA: Diagnosis not present

## 2019-01-10 DIAGNOSIS — L03115 Cellulitis of right lower limb: Secondary | ICD-10-CM | POA: Diagnosis not present

## 2019-01-10 DIAGNOSIS — E441 Mild protein-calorie malnutrition: Secondary | ICD-10-CM | POA: Diagnosis not present

## 2019-01-10 DIAGNOSIS — I10 Essential (primary) hypertension: Secondary | ICD-10-CM | POA: Diagnosis not present

## 2019-01-10 DIAGNOSIS — F0280 Dementia in other diseases classified elsewhere without behavioral disturbance: Secondary | ICD-10-CM | POA: Diagnosis not present

## 2019-01-10 DIAGNOSIS — G301 Alzheimer's disease with late onset: Secondary | ICD-10-CM | POA: Diagnosis not present

## 2019-01-10 DIAGNOSIS — S81801D Unspecified open wound, right lower leg, subsequent encounter: Secondary | ICD-10-CM | POA: Diagnosis not present

## 2019-01-11 DIAGNOSIS — S81801D Unspecified open wound, right lower leg, subsequent encounter: Secondary | ICD-10-CM | POA: Diagnosis not present

## 2019-01-11 DIAGNOSIS — L03115 Cellulitis of right lower limb: Secondary | ICD-10-CM | POA: Diagnosis not present

## 2019-01-11 DIAGNOSIS — F0280 Dementia in other diseases classified elsewhere without behavioral disturbance: Secondary | ICD-10-CM | POA: Diagnosis not present

## 2019-01-11 DIAGNOSIS — I10 Essential (primary) hypertension: Secondary | ICD-10-CM | POA: Diagnosis not present

## 2019-01-11 DIAGNOSIS — G301 Alzheimer's disease with late onset: Secondary | ICD-10-CM | POA: Diagnosis not present

## 2019-01-11 DIAGNOSIS — E441 Mild protein-calorie malnutrition: Secondary | ICD-10-CM | POA: Diagnosis not present

## 2019-01-12 DIAGNOSIS — G301 Alzheimer's disease with late onset: Secondary | ICD-10-CM | POA: Diagnosis not present

## 2019-01-12 DIAGNOSIS — E441 Mild protein-calorie malnutrition: Secondary | ICD-10-CM | POA: Diagnosis not present

## 2019-01-12 DIAGNOSIS — I10 Essential (primary) hypertension: Secondary | ICD-10-CM | POA: Diagnosis not present

## 2019-01-12 DIAGNOSIS — S81801D Unspecified open wound, right lower leg, subsequent encounter: Secondary | ICD-10-CM | POA: Diagnosis not present

## 2019-01-12 DIAGNOSIS — F0280 Dementia in other diseases classified elsewhere without behavioral disturbance: Secondary | ICD-10-CM | POA: Diagnosis not present

## 2019-01-12 DIAGNOSIS — L03115 Cellulitis of right lower limb: Secondary | ICD-10-CM | POA: Diagnosis not present

## 2019-01-13 DIAGNOSIS — F0280 Dementia in other diseases classified elsewhere without behavioral disturbance: Secondary | ICD-10-CM | POA: Diagnosis not present

## 2019-01-13 DIAGNOSIS — K5904 Chronic idiopathic constipation: Secondary | ICD-10-CM | POA: Diagnosis not present

## 2019-01-13 DIAGNOSIS — G301 Alzheimer's disease with late onset: Secondary | ICD-10-CM | POA: Diagnosis not present

## 2019-01-13 DIAGNOSIS — S81801D Unspecified open wound, right lower leg, subsequent encounter: Secondary | ICD-10-CM | POA: Diagnosis not present

## 2019-01-13 DIAGNOSIS — F329 Major depressive disorder, single episode, unspecified: Secondary | ICD-10-CM | POA: Diagnosis not present

## 2019-01-13 DIAGNOSIS — Z66 Do not resuscitate: Secondary | ICD-10-CM | POA: Diagnosis not present

## 2019-01-13 DIAGNOSIS — I10 Essential (primary) hypertension: Secondary | ICD-10-CM | POA: Diagnosis not present

## 2019-01-13 DIAGNOSIS — H353190 Nonexudative age-related macular degeneration, unspecified eye, stage unspecified: Secondary | ICD-10-CM | POA: Diagnosis not present

## 2019-01-13 DIAGNOSIS — R32 Unspecified urinary incontinence: Secondary | ICD-10-CM | POA: Diagnosis not present

## 2019-01-13 DIAGNOSIS — K219 Gastro-esophageal reflux disease without esophagitis: Secondary | ICD-10-CM | POA: Diagnosis not present

## 2019-01-13 DIAGNOSIS — L03115 Cellulitis of right lower limb: Secondary | ICD-10-CM | POA: Diagnosis not present

## 2019-01-13 DIAGNOSIS — M1990 Unspecified osteoarthritis, unspecified site: Secondary | ICD-10-CM | POA: Diagnosis not present

## 2019-01-13 DIAGNOSIS — R159 Full incontinence of feces: Secondary | ICD-10-CM | POA: Diagnosis not present

## 2019-01-13 DIAGNOSIS — E441 Mild protein-calorie malnutrition: Secondary | ICD-10-CM | POA: Diagnosis not present

## 2019-01-14 DIAGNOSIS — F0280 Dementia in other diseases classified elsewhere without behavioral disturbance: Secondary | ICD-10-CM | POA: Diagnosis not present

## 2019-01-14 DIAGNOSIS — E441 Mild protein-calorie malnutrition: Secondary | ICD-10-CM | POA: Diagnosis not present

## 2019-01-14 DIAGNOSIS — I10 Essential (primary) hypertension: Secondary | ICD-10-CM | POA: Diagnosis not present

## 2019-01-14 DIAGNOSIS — L03115 Cellulitis of right lower limb: Secondary | ICD-10-CM | POA: Diagnosis not present

## 2019-01-14 DIAGNOSIS — G301 Alzheimer's disease with late onset: Secondary | ICD-10-CM | POA: Diagnosis not present

## 2019-01-14 DIAGNOSIS — S81801D Unspecified open wound, right lower leg, subsequent encounter: Secondary | ICD-10-CM | POA: Diagnosis not present

## 2019-01-17 DIAGNOSIS — F0280 Dementia in other diseases classified elsewhere without behavioral disturbance: Secondary | ICD-10-CM | POA: Diagnosis not present

## 2019-01-17 DIAGNOSIS — L03115 Cellulitis of right lower limb: Secondary | ICD-10-CM | POA: Diagnosis not present

## 2019-01-17 DIAGNOSIS — E441 Mild protein-calorie malnutrition: Secondary | ICD-10-CM | POA: Diagnosis not present

## 2019-01-17 DIAGNOSIS — G301 Alzheimer's disease with late onset: Secondary | ICD-10-CM | POA: Diagnosis not present

## 2019-01-17 DIAGNOSIS — S81801D Unspecified open wound, right lower leg, subsequent encounter: Secondary | ICD-10-CM | POA: Diagnosis not present

## 2019-01-17 DIAGNOSIS — I10 Essential (primary) hypertension: Secondary | ICD-10-CM | POA: Diagnosis not present

## 2019-01-18 DIAGNOSIS — S81801D Unspecified open wound, right lower leg, subsequent encounter: Secondary | ICD-10-CM | POA: Diagnosis not present

## 2019-01-18 DIAGNOSIS — G301 Alzheimer's disease with late onset: Secondary | ICD-10-CM | POA: Diagnosis not present

## 2019-01-18 DIAGNOSIS — F0280 Dementia in other diseases classified elsewhere without behavioral disturbance: Secondary | ICD-10-CM | POA: Diagnosis not present

## 2019-01-18 DIAGNOSIS — L03115 Cellulitis of right lower limb: Secondary | ICD-10-CM | POA: Diagnosis not present

## 2019-01-18 DIAGNOSIS — I10 Essential (primary) hypertension: Secondary | ICD-10-CM | POA: Diagnosis not present

## 2019-01-18 DIAGNOSIS — E441 Mild protein-calorie malnutrition: Secondary | ICD-10-CM | POA: Diagnosis not present

## 2019-01-19 DIAGNOSIS — L03115 Cellulitis of right lower limb: Secondary | ICD-10-CM | POA: Diagnosis not present

## 2019-01-19 DIAGNOSIS — I10 Essential (primary) hypertension: Secondary | ICD-10-CM | POA: Diagnosis not present

## 2019-01-19 DIAGNOSIS — G301 Alzheimer's disease with late onset: Secondary | ICD-10-CM | POA: Diagnosis not present

## 2019-01-19 DIAGNOSIS — E441 Mild protein-calorie malnutrition: Secondary | ICD-10-CM | POA: Diagnosis not present

## 2019-01-19 DIAGNOSIS — S81801D Unspecified open wound, right lower leg, subsequent encounter: Secondary | ICD-10-CM | POA: Diagnosis not present

## 2019-01-19 DIAGNOSIS — F0280 Dementia in other diseases classified elsewhere without behavioral disturbance: Secondary | ICD-10-CM | POA: Diagnosis not present

## 2019-01-20 DIAGNOSIS — S81801D Unspecified open wound, right lower leg, subsequent encounter: Secondary | ICD-10-CM | POA: Diagnosis not present

## 2019-01-20 DIAGNOSIS — E441 Mild protein-calorie malnutrition: Secondary | ICD-10-CM | POA: Diagnosis not present

## 2019-01-20 DIAGNOSIS — L03115 Cellulitis of right lower limb: Secondary | ICD-10-CM | POA: Diagnosis not present

## 2019-01-20 DIAGNOSIS — I10 Essential (primary) hypertension: Secondary | ICD-10-CM | POA: Diagnosis not present

## 2019-01-20 DIAGNOSIS — F0280 Dementia in other diseases classified elsewhere without behavioral disturbance: Secondary | ICD-10-CM | POA: Diagnosis not present

## 2019-01-20 DIAGNOSIS — G301 Alzheimer's disease with late onset: Secondary | ICD-10-CM | POA: Diagnosis not present

## 2019-01-21 DIAGNOSIS — S81801D Unspecified open wound, right lower leg, subsequent encounter: Secondary | ICD-10-CM | POA: Diagnosis not present

## 2019-01-21 DIAGNOSIS — G301 Alzheimer's disease with late onset: Secondary | ICD-10-CM | POA: Diagnosis not present

## 2019-01-21 DIAGNOSIS — L03115 Cellulitis of right lower limb: Secondary | ICD-10-CM | POA: Diagnosis not present

## 2019-01-21 DIAGNOSIS — E441 Mild protein-calorie malnutrition: Secondary | ICD-10-CM | POA: Diagnosis not present

## 2019-01-21 DIAGNOSIS — F0280 Dementia in other diseases classified elsewhere without behavioral disturbance: Secondary | ICD-10-CM | POA: Diagnosis not present

## 2019-01-21 DIAGNOSIS — I10 Essential (primary) hypertension: Secondary | ICD-10-CM | POA: Diagnosis not present

## 2019-01-24 DIAGNOSIS — F0280 Dementia in other diseases classified elsewhere without behavioral disturbance: Secondary | ICD-10-CM | POA: Diagnosis not present

## 2019-01-24 DIAGNOSIS — G301 Alzheimer's disease with late onset: Secondary | ICD-10-CM | POA: Diagnosis not present

## 2019-01-24 DIAGNOSIS — L03115 Cellulitis of right lower limb: Secondary | ICD-10-CM | POA: Diagnosis not present

## 2019-01-24 DIAGNOSIS — E441 Mild protein-calorie malnutrition: Secondary | ICD-10-CM | POA: Diagnosis not present

## 2019-01-24 DIAGNOSIS — I10 Essential (primary) hypertension: Secondary | ICD-10-CM | POA: Diagnosis not present

## 2019-01-24 DIAGNOSIS — S81801D Unspecified open wound, right lower leg, subsequent encounter: Secondary | ICD-10-CM | POA: Diagnosis not present

## 2019-01-25 DIAGNOSIS — I10 Essential (primary) hypertension: Secondary | ICD-10-CM | POA: Diagnosis not present

## 2019-01-25 DIAGNOSIS — S81801D Unspecified open wound, right lower leg, subsequent encounter: Secondary | ICD-10-CM | POA: Diagnosis not present

## 2019-01-25 DIAGNOSIS — F0280 Dementia in other diseases classified elsewhere without behavioral disturbance: Secondary | ICD-10-CM | POA: Diagnosis not present

## 2019-01-25 DIAGNOSIS — L03115 Cellulitis of right lower limb: Secondary | ICD-10-CM | POA: Diagnosis not present

## 2019-01-25 DIAGNOSIS — G301 Alzheimer's disease with late onset: Secondary | ICD-10-CM | POA: Diagnosis not present

## 2019-01-25 DIAGNOSIS — E441 Mild protein-calorie malnutrition: Secondary | ICD-10-CM | POA: Diagnosis not present

## 2019-01-26 DIAGNOSIS — S81801D Unspecified open wound, right lower leg, subsequent encounter: Secondary | ICD-10-CM | POA: Diagnosis not present

## 2019-01-26 DIAGNOSIS — F0280 Dementia in other diseases classified elsewhere without behavioral disturbance: Secondary | ICD-10-CM | POA: Diagnosis not present

## 2019-01-26 DIAGNOSIS — E441 Mild protein-calorie malnutrition: Secondary | ICD-10-CM | POA: Diagnosis not present

## 2019-01-26 DIAGNOSIS — G301 Alzheimer's disease with late onset: Secondary | ICD-10-CM | POA: Diagnosis not present

## 2019-01-26 DIAGNOSIS — I10 Essential (primary) hypertension: Secondary | ICD-10-CM | POA: Diagnosis not present

## 2019-01-26 DIAGNOSIS — L03115 Cellulitis of right lower limb: Secondary | ICD-10-CM | POA: Diagnosis not present

## 2019-01-27 DIAGNOSIS — G301 Alzheimer's disease with late onset: Secondary | ICD-10-CM | POA: Diagnosis not present

## 2019-01-27 DIAGNOSIS — L03115 Cellulitis of right lower limb: Secondary | ICD-10-CM | POA: Diagnosis not present

## 2019-01-27 DIAGNOSIS — S81801D Unspecified open wound, right lower leg, subsequent encounter: Secondary | ICD-10-CM | POA: Diagnosis not present

## 2019-01-27 DIAGNOSIS — E441 Mild protein-calorie malnutrition: Secondary | ICD-10-CM | POA: Diagnosis not present

## 2019-01-27 DIAGNOSIS — I10 Essential (primary) hypertension: Secondary | ICD-10-CM | POA: Diagnosis not present

## 2019-01-27 DIAGNOSIS — F0280 Dementia in other diseases classified elsewhere without behavioral disturbance: Secondary | ICD-10-CM | POA: Diagnosis not present

## 2019-01-28 DIAGNOSIS — E441 Mild protein-calorie malnutrition: Secondary | ICD-10-CM | POA: Diagnosis not present

## 2019-01-28 DIAGNOSIS — I10 Essential (primary) hypertension: Secondary | ICD-10-CM | POA: Diagnosis not present

## 2019-01-28 DIAGNOSIS — L03115 Cellulitis of right lower limb: Secondary | ICD-10-CM | POA: Diagnosis not present

## 2019-01-28 DIAGNOSIS — F0280 Dementia in other diseases classified elsewhere without behavioral disturbance: Secondary | ICD-10-CM | POA: Diagnosis not present

## 2019-01-28 DIAGNOSIS — S81801D Unspecified open wound, right lower leg, subsequent encounter: Secondary | ICD-10-CM | POA: Diagnosis not present

## 2019-01-28 DIAGNOSIS — G301 Alzheimer's disease with late onset: Secondary | ICD-10-CM | POA: Diagnosis not present

## 2019-01-29 DIAGNOSIS — G301 Alzheimer's disease with late onset: Secondary | ICD-10-CM | POA: Diagnosis not present

## 2019-01-29 DIAGNOSIS — E441 Mild protein-calorie malnutrition: Secondary | ICD-10-CM | POA: Diagnosis not present

## 2019-01-29 DIAGNOSIS — I10 Essential (primary) hypertension: Secondary | ICD-10-CM | POA: Diagnosis not present

## 2019-01-29 DIAGNOSIS — F0280 Dementia in other diseases classified elsewhere without behavioral disturbance: Secondary | ICD-10-CM | POA: Diagnosis not present

## 2019-01-29 DIAGNOSIS — L03115 Cellulitis of right lower limb: Secondary | ICD-10-CM | POA: Diagnosis not present

## 2019-01-29 DIAGNOSIS — S81801D Unspecified open wound, right lower leg, subsequent encounter: Secondary | ICD-10-CM | POA: Diagnosis not present

## 2019-01-31 DIAGNOSIS — E441 Mild protein-calorie malnutrition: Secondary | ICD-10-CM | POA: Diagnosis not present

## 2019-01-31 DIAGNOSIS — F0280 Dementia in other diseases classified elsewhere without behavioral disturbance: Secondary | ICD-10-CM | POA: Diagnosis not present

## 2019-01-31 DIAGNOSIS — G301 Alzheimer's disease with late onset: Secondary | ICD-10-CM | POA: Diagnosis not present

## 2019-01-31 DIAGNOSIS — L03115 Cellulitis of right lower limb: Secondary | ICD-10-CM | POA: Diagnosis not present

## 2019-01-31 DIAGNOSIS — I10 Essential (primary) hypertension: Secondary | ICD-10-CM | POA: Diagnosis not present

## 2019-01-31 DIAGNOSIS — S81801D Unspecified open wound, right lower leg, subsequent encounter: Secondary | ICD-10-CM | POA: Diagnosis not present

## 2019-02-01 DIAGNOSIS — E441 Mild protein-calorie malnutrition: Secondary | ICD-10-CM | POA: Diagnosis not present

## 2019-02-01 DIAGNOSIS — F0280 Dementia in other diseases classified elsewhere without behavioral disturbance: Secondary | ICD-10-CM | POA: Diagnosis not present

## 2019-02-01 DIAGNOSIS — L03115 Cellulitis of right lower limb: Secondary | ICD-10-CM | POA: Diagnosis not present

## 2019-02-01 DIAGNOSIS — G301 Alzheimer's disease with late onset: Secondary | ICD-10-CM | POA: Diagnosis not present

## 2019-02-01 DIAGNOSIS — I10 Essential (primary) hypertension: Secondary | ICD-10-CM | POA: Diagnosis not present

## 2019-02-01 DIAGNOSIS — S81801D Unspecified open wound, right lower leg, subsequent encounter: Secondary | ICD-10-CM | POA: Diagnosis not present

## 2019-02-02 DIAGNOSIS — G301 Alzheimer's disease with late onset: Secondary | ICD-10-CM | POA: Diagnosis not present

## 2019-02-02 DIAGNOSIS — S81801D Unspecified open wound, right lower leg, subsequent encounter: Secondary | ICD-10-CM | POA: Diagnosis not present

## 2019-02-02 DIAGNOSIS — F0280 Dementia in other diseases classified elsewhere without behavioral disturbance: Secondary | ICD-10-CM | POA: Diagnosis not present

## 2019-02-02 DIAGNOSIS — L03115 Cellulitis of right lower limb: Secondary | ICD-10-CM | POA: Diagnosis not present

## 2019-02-02 DIAGNOSIS — E441 Mild protein-calorie malnutrition: Secondary | ICD-10-CM | POA: Diagnosis not present

## 2019-02-02 DIAGNOSIS — I10 Essential (primary) hypertension: Secondary | ICD-10-CM | POA: Diagnosis not present

## 2019-02-03 DIAGNOSIS — F0280 Dementia in other diseases classified elsewhere without behavioral disturbance: Secondary | ICD-10-CM | POA: Diagnosis not present

## 2019-02-03 DIAGNOSIS — E441 Mild protein-calorie malnutrition: Secondary | ICD-10-CM | POA: Diagnosis not present

## 2019-02-03 DIAGNOSIS — S81801D Unspecified open wound, right lower leg, subsequent encounter: Secondary | ICD-10-CM | POA: Diagnosis not present

## 2019-02-03 DIAGNOSIS — I10 Essential (primary) hypertension: Secondary | ICD-10-CM | POA: Diagnosis not present

## 2019-02-03 DIAGNOSIS — L03115 Cellulitis of right lower limb: Secondary | ICD-10-CM | POA: Diagnosis not present

## 2019-02-03 DIAGNOSIS — G301 Alzheimer's disease with late onset: Secondary | ICD-10-CM | POA: Diagnosis not present

## 2019-02-04 DIAGNOSIS — E441 Mild protein-calorie malnutrition: Secondary | ICD-10-CM | POA: Diagnosis not present

## 2019-02-04 DIAGNOSIS — I10 Essential (primary) hypertension: Secondary | ICD-10-CM | POA: Diagnosis not present

## 2019-02-04 DIAGNOSIS — L03115 Cellulitis of right lower limb: Secondary | ICD-10-CM | POA: Diagnosis not present

## 2019-02-04 DIAGNOSIS — S81801D Unspecified open wound, right lower leg, subsequent encounter: Secondary | ICD-10-CM | POA: Diagnosis not present

## 2019-02-04 DIAGNOSIS — G301 Alzheimer's disease with late onset: Secondary | ICD-10-CM | POA: Diagnosis not present

## 2019-02-04 DIAGNOSIS — F0280 Dementia in other diseases classified elsewhere without behavioral disturbance: Secondary | ICD-10-CM | POA: Diagnosis not present

## 2019-02-07 DIAGNOSIS — I10 Essential (primary) hypertension: Secondary | ICD-10-CM | POA: Diagnosis not present

## 2019-02-07 DIAGNOSIS — G301 Alzheimer's disease with late onset: Secondary | ICD-10-CM | POA: Diagnosis not present

## 2019-02-07 DIAGNOSIS — E441 Mild protein-calorie malnutrition: Secondary | ICD-10-CM | POA: Diagnosis not present

## 2019-02-07 DIAGNOSIS — L03115 Cellulitis of right lower limb: Secondary | ICD-10-CM | POA: Diagnosis not present

## 2019-02-07 DIAGNOSIS — S81801D Unspecified open wound, right lower leg, subsequent encounter: Secondary | ICD-10-CM | POA: Diagnosis not present

## 2019-02-07 DIAGNOSIS — F0280 Dementia in other diseases classified elsewhere without behavioral disturbance: Secondary | ICD-10-CM | POA: Diagnosis not present

## 2019-02-08 DIAGNOSIS — S81801D Unspecified open wound, right lower leg, subsequent encounter: Secondary | ICD-10-CM | POA: Diagnosis not present

## 2019-02-08 DIAGNOSIS — E441 Mild protein-calorie malnutrition: Secondary | ICD-10-CM | POA: Diagnosis not present

## 2019-02-08 DIAGNOSIS — I10 Essential (primary) hypertension: Secondary | ICD-10-CM | POA: Diagnosis not present

## 2019-02-08 DIAGNOSIS — F0280 Dementia in other diseases classified elsewhere without behavioral disturbance: Secondary | ICD-10-CM | POA: Diagnosis not present

## 2019-02-08 DIAGNOSIS — G301 Alzheimer's disease with late onset: Secondary | ICD-10-CM | POA: Diagnosis not present

## 2019-02-08 DIAGNOSIS — L03115 Cellulitis of right lower limb: Secondary | ICD-10-CM | POA: Diagnosis not present

## 2019-02-09 ENCOUNTER — Encounter: Payer: Self-pay | Admitting: *Deleted

## 2019-02-09 DIAGNOSIS — L03115 Cellulitis of right lower limb: Secondary | ICD-10-CM | POA: Diagnosis not present

## 2019-02-09 DIAGNOSIS — I10 Essential (primary) hypertension: Secondary | ICD-10-CM | POA: Diagnosis not present

## 2019-02-09 DIAGNOSIS — G301 Alzheimer's disease with late onset: Secondary | ICD-10-CM | POA: Diagnosis not present

## 2019-02-09 DIAGNOSIS — F0280 Dementia in other diseases classified elsewhere without behavioral disturbance: Secondary | ICD-10-CM | POA: Diagnosis not present

## 2019-02-09 DIAGNOSIS — E441 Mild protein-calorie malnutrition: Secondary | ICD-10-CM | POA: Diagnosis not present

## 2019-02-09 DIAGNOSIS — S81801D Unspecified open wound, right lower leg, subsequent encounter: Secondary | ICD-10-CM | POA: Diagnosis not present

## 2019-02-10 DIAGNOSIS — S81801D Unspecified open wound, right lower leg, subsequent encounter: Secondary | ICD-10-CM | POA: Diagnosis not present

## 2019-02-10 DIAGNOSIS — F0280 Dementia in other diseases classified elsewhere without behavioral disturbance: Secondary | ICD-10-CM | POA: Diagnosis not present

## 2019-02-10 DIAGNOSIS — E441 Mild protein-calorie malnutrition: Secondary | ICD-10-CM | POA: Diagnosis not present

## 2019-02-10 DIAGNOSIS — I10 Essential (primary) hypertension: Secondary | ICD-10-CM | POA: Diagnosis not present

## 2019-02-10 DIAGNOSIS — L03115 Cellulitis of right lower limb: Secondary | ICD-10-CM | POA: Diagnosis not present

## 2019-02-10 DIAGNOSIS — G301 Alzheimer's disease with late onset: Secondary | ICD-10-CM | POA: Diagnosis not present

## 2019-02-11 DIAGNOSIS — S81801D Unspecified open wound, right lower leg, subsequent encounter: Secondary | ICD-10-CM | POA: Diagnosis not present

## 2019-02-11 DIAGNOSIS — G301 Alzheimer's disease with late onset: Secondary | ICD-10-CM | POA: Diagnosis not present

## 2019-02-11 DIAGNOSIS — E441 Mild protein-calorie malnutrition: Secondary | ICD-10-CM | POA: Diagnosis not present

## 2019-02-11 DIAGNOSIS — I10 Essential (primary) hypertension: Secondary | ICD-10-CM | POA: Diagnosis not present

## 2019-02-11 DIAGNOSIS — L03115 Cellulitis of right lower limb: Secondary | ICD-10-CM | POA: Diagnosis not present

## 2019-02-11 DIAGNOSIS — F0280 Dementia in other diseases classified elsewhere without behavioral disturbance: Secondary | ICD-10-CM | POA: Diagnosis not present

## 2019-02-12 DIAGNOSIS — E441 Mild protein-calorie malnutrition: Secondary | ICD-10-CM | POA: Diagnosis not present

## 2019-02-12 DIAGNOSIS — I10 Essential (primary) hypertension: Secondary | ICD-10-CM | POA: Diagnosis not present

## 2019-02-12 DIAGNOSIS — S81801D Unspecified open wound, right lower leg, subsequent encounter: Secondary | ICD-10-CM | POA: Diagnosis not present

## 2019-02-12 DIAGNOSIS — L03115 Cellulitis of right lower limb: Secondary | ICD-10-CM | POA: Diagnosis not present

## 2019-02-12 DIAGNOSIS — F0280 Dementia in other diseases classified elsewhere without behavioral disturbance: Secondary | ICD-10-CM | POA: Diagnosis not present

## 2019-02-12 DIAGNOSIS — G301 Alzheimer's disease with late onset: Secondary | ICD-10-CM | POA: Diagnosis not present

## 2019-02-13 DIAGNOSIS — S81801D Unspecified open wound, right lower leg, subsequent encounter: Secondary | ICD-10-CM | POA: Diagnosis not present

## 2019-02-13 DIAGNOSIS — I10 Essential (primary) hypertension: Secondary | ICD-10-CM | POA: Diagnosis not present

## 2019-02-13 DIAGNOSIS — F0280 Dementia in other diseases classified elsewhere without behavioral disturbance: Secondary | ICD-10-CM | POA: Diagnosis not present

## 2019-02-13 DIAGNOSIS — G301 Alzheimer's disease with late onset: Secondary | ICD-10-CM | POA: Diagnosis not present

## 2019-02-13 DIAGNOSIS — F329 Major depressive disorder, single episode, unspecified: Secondary | ICD-10-CM | POA: Diagnosis not present

## 2019-02-13 DIAGNOSIS — E441 Mild protein-calorie malnutrition: Secondary | ICD-10-CM | POA: Diagnosis not present

## 2019-02-13 DIAGNOSIS — K5904 Chronic idiopathic constipation: Secondary | ICD-10-CM | POA: Diagnosis not present

## 2019-02-13 DIAGNOSIS — M1990 Unspecified osteoarthritis, unspecified site: Secondary | ICD-10-CM | POA: Diagnosis not present

## 2019-02-13 DIAGNOSIS — K219 Gastro-esophageal reflux disease without esophagitis: Secondary | ICD-10-CM | POA: Diagnosis not present

## 2019-02-13 DIAGNOSIS — R32 Unspecified urinary incontinence: Secondary | ICD-10-CM | POA: Diagnosis not present

## 2019-02-13 DIAGNOSIS — Z66 Do not resuscitate: Secondary | ICD-10-CM | POA: Diagnosis not present

## 2019-02-13 DIAGNOSIS — H353190 Nonexudative age-related macular degeneration, unspecified eye, stage unspecified: Secondary | ICD-10-CM | POA: Diagnosis not present

## 2019-02-13 DIAGNOSIS — L03115 Cellulitis of right lower limb: Secondary | ICD-10-CM | POA: Diagnosis not present

## 2019-02-13 DIAGNOSIS — R159 Full incontinence of feces: Secondary | ICD-10-CM | POA: Diagnosis not present

## 2019-03-15 DEATH — deceased
